# Patient Record
Sex: Male | Born: 1945 | Race: Black or African American | Hispanic: No | Marital: Married | Smoking: Never smoker
Health system: Southern US, Community
[De-identification: ages and names within clinical notes are randomized; demographics above are authoritative.]

## PROBLEM LIST (undated history)

## (undated) DIAGNOSIS — E119 Type 2 diabetes mellitus without complications: Secondary | ICD-10-CM

## (undated) DIAGNOSIS — K219 Gastro-esophageal reflux disease without esophagitis: Secondary | ICD-10-CM

## (undated) DIAGNOSIS — C9000 Multiple myeloma not having achieved remission: Secondary | ICD-10-CM

## (undated) DIAGNOSIS — E079 Disorder of thyroid, unspecified: Secondary | ICD-10-CM

## (undated) DIAGNOSIS — I10 Essential (primary) hypertension: Secondary | ICD-10-CM

## (undated) DIAGNOSIS — E785 Hyperlipidemia, unspecified: Secondary | ICD-10-CM

## (undated) DIAGNOSIS — C73 Malignant neoplasm of thyroid gland: Secondary | ICD-10-CM

## (undated) DIAGNOSIS — E669 Obesity, unspecified: Secondary | ICD-10-CM

## (undated) HISTORY — DX: Type 2 diabetes mellitus without complications: E11.9

## (undated) HISTORY — DX: Multiple myeloma not having achieved remission: C90.00

## (undated) HISTORY — DX: Hyperlipidemia, unspecified: E78.5

## (undated) HISTORY — DX: Obesity, unspecified: E66.9

## (undated) HISTORY — DX: Essential (primary) hypertension: I10

## (undated) HISTORY — PX: CATARACT EXTRACTION BILATERAL W/ ANTERIOR VITRECTOMY: SHX1304

## (undated) HISTORY — DX: Gastro-esophageal reflux disease without esophagitis: K21.9

## (undated) HISTORY — DX: Disorder of thyroid, unspecified: E07.9

## (undated) HISTORY — DX: Malignant neoplasm of thyroid gland: C73

---

## 2009-03-22 ENCOUNTER — Ambulatory Visit: Payer: Self-pay | Admitting: Gastroenterology

## 2011-04-10 ENCOUNTER — Ambulatory Visit: Payer: Self-pay | Admitting: Gastroenterology

## 2012-02-07 HISTORY — PX: BONE MARROW BIOPSY: SHX199

## 2012-05-08 ENCOUNTER — Ambulatory Visit: Payer: Self-pay | Admitting: Internal Medicine

## 2012-05-14 LAB — CALCIUM: Calcium, Total: 9.3 mg/dL (ref 8.5–10.1)

## 2012-05-14 LAB — CBC CANCER CENTER
HCT: 34.1 % — ABNORMAL LOW (ref 40.0–52.0)
HGB: 11.3 g/dL — ABNORMAL LOW (ref 13.0–18.0)
Lymphocytes: 20 %
MCH: 30 pg (ref 26.0–34.0)
MCHC: 33 g/dL (ref 32.0–36.0)
MCV: 91 fL (ref 80–100)
Platelet: 270 x10 3/mm (ref 150–440)
RBC: 3.75 10*6/uL — ABNORMAL LOW (ref 4.40–5.90)
RDW: 16.1 % — ABNORMAL HIGH (ref 11.5–14.5)
WBC: 4.7 x10 3/mm (ref 3.8–10.6)

## 2012-05-14 LAB — CREATININE, SERUM: EGFR (Non-African Amer.): 36 — ABNORMAL LOW

## 2012-05-15 LAB — PROT IMMUNOELECT,UR-24HR

## 2012-06-03 LAB — CBC CANCER CENTER
Basophil #: 0 x10 3/mm (ref 0.0–0.1)
Basophil %: 0.9 %
Eosinophil %: 2.5 %
HCT: 32.9 % — ABNORMAL LOW (ref 40.0–52.0)
HGB: 11 g/dL — ABNORMAL LOW (ref 13.0–18.0)
Lymphocyte #: 1.6 x10 3/mm (ref 1.0–3.6)
Lymphocyte %: 39.4 %
MCH: 30.7 pg (ref 26.0–34.0)
MCHC: 33.4 g/dL (ref 32.0–36.0)
Neutrophil #: 1.8 x10 3/mm (ref 1.4–6.5)
Platelet: 237 x10 3/mm (ref 150–440)
RDW: 16.5 % — ABNORMAL HIGH (ref 11.5–14.5)
WBC: 4.1 x10 3/mm (ref 3.8–10.6)

## 2012-06-03 LAB — CREATININE, SERUM
Creatinine: 1.85 mg/dL — ABNORMAL HIGH (ref 0.60–1.30)
EGFR (African American): 42 — ABNORMAL LOW
EGFR (Non-African Amer.): 37 — ABNORMAL LOW

## 2012-06-03 LAB — GLUCOSE, RANDOM: Glucose: 113 mg/dL — ABNORMAL HIGH (ref 65–99)

## 2012-06-06 ENCOUNTER — Ambulatory Visit: Payer: Self-pay | Admitting: Internal Medicine

## 2012-06-10 LAB — GLUCOSE, RANDOM: Glucose: 84 mg/dL (ref 65–99)

## 2012-06-10 LAB — CBC CANCER CENTER
Basophil #: 0 x10 3/mm (ref 0.0–0.1)
Basophil %: 0.3 %
HCT: 32.3 % — ABNORMAL LOW (ref 40.0–52.0)
HGB: 10.8 g/dL — ABNORMAL LOW (ref 13.0–18.0)
Lymphocyte %: 18.4 %
MCHC: 33.5 g/dL (ref 32.0–36.0)
MCV: 92 fL (ref 80–100)
Monocyte %: 8 %
Platelet: 209 x10 3/mm (ref 150–440)
RBC: 3.53 10*6/uL — ABNORMAL LOW (ref 4.40–5.90)
RDW: 16.6 % — ABNORMAL HIGH (ref 11.5–14.5)

## 2012-06-17 LAB — CBC CANCER CENTER
Basophil #: 0 x10 3/mm (ref 0.0–0.1)
Basophil %: 0.4 %
Eosinophil #: 0.3 x10 3/mm (ref 0.0–0.7)
HGB: 10 g/dL — ABNORMAL LOW (ref 13.0–18.0)
Lymphocyte %: 11 %
Monocyte #: 0.7 x10 3/mm (ref 0.2–1.0)
Monocyte %: 12.7 %
Neutrophil #: 3.7 x10 3/mm (ref 1.4–6.5)
Neutrophil %: 69.6 %
Platelet: 171 x10 3/mm (ref 150–440)
RDW: 16.7 % — ABNORMAL HIGH (ref 11.5–14.5)

## 2012-06-17 LAB — GLUCOSE, RANDOM: Glucose: 124 mg/dL — ABNORMAL HIGH (ref 65–99)

## 2012-07-02 LAB — CBC CANCER CENTER
Basophil #: 0 x10 3/mm (ref 0.0–0.1)
Basophil %: 0.7 %
HCT: 29.8 % — ABNORMAL LOW (ref 40.0–52.0)
HGB: 9.9 g/dL — ABNORMAL LOW (ref 13.0–18.0)
Lymphocyte #: 0.7 x10 3/mm — ABNORMAL LOW (ref 1.0–3.6)
MCV: 93 fL (ref 80–100)
Monocyte #: 0.3 x10 3/mm (ref 0.2–1.0)
Monocyte %: 8.9 %
Neutrophil #: 2.3 x10 3/mm (ref 1.4–6.5)
Neutrophil %: 61.7 %
Platelet: 206 x10 3/mm (ref 150–440)
RBC: 3.21 10*6/uL — ABNORMAL LOW (ref 4.40–5.90)
RDW: 17.1 % — ABNORMAL HIGH (ref 11.5–14.5)

## 2012-07-02 LAB — GLUCOSE, RANDOM: Glucose: 50 mg/dL — ABNORMAL LOW (ref 65–99)

## 2012-07-02 LAB — CREATININE, SERUM
Creatinine: 1.58 mg/dL — ABNORMAL HIGH (ref 0.60–1.30)
EGFR (African American): 51 — ABNORMAL LOW
EGFR (Non-African Amer.): 44 — ABNORMAL LOW

## 2012-07-02 LAB — HEPATIC FUNCTION PANEL A (ARMC)
Bilirubin, Direct: 0.1 mg/dL (ref 0.00–0.20)
Bilirubin,Total: 0.2 mg/dL (ref 0.2–1.0)
SGPT (ALT): 22 U/L (ref 12–78)
Total Protein: 7 g/dL (ref 6.4–8.2)

## 2012-07-07 ENCOUNTER — Ambulatory Visit: Payer: Self-pay | Admitting: Internal Medicine

## 2012-07-09 LAB — CBC CANCER CENTER
Basophil #: 0 x10 3/mm (ref 0.0–0.1)
Eosinophil %: 9.4 %
HCT: 29.3 % — ABNORMAL LOW (ref 40.0–52.0)
HGB: 9.5 g/dL — ABNORMAL LOW (ref 13.0–18.0)
Lymphocyte #: 1.1 x10 3/mm (ref 1.0–3.6)
Lymphocyte %: 29.2 %
MCH: 30.5 pg (ref 26.0–34.0)
MCV: 94 fL (ref 80–100)
Monocyte #: 0.8 x10 3/mm (ref 0.2–1.0)
Monocyte %: 20.7 %
Neutrophil #: 1.6 x10 3/mm (ref 1.4–6.5)
Neutrophil %: 40.1 %
RBC: 3.12 10*6/uL — ABNORMAL LOW (ref 4.40–5.90)
WBC: 3.9 x10 3/mm (ref 3.8–10.6)

## 2012-07-09 LAB — CREATININE, SERUM
Creatinine: 1.6 mg/dL — ABNORMAL HIGH (ref 0.60–1.30)
EGFR (African American): 51 — ABNORMAL LOW

## 2012-07-09 LAB — CALCIUM: Calcium, Total: 8.7 mg/dL (ref 8.5–10.1)

## 2012-07-16 LAB — CBC CANCER CENTER
Basophil #: 0 x10 3/mm (ref 0.0–0.1)
Eosinophil %: 7.1 %
HCT: 30.9 % — ABNORMAL LOW (ref 40.0–52.0)
HGB: 10.3 g/dL — ABNORMAL LOW (ref 13.0–18.0)
Lymphocyte #: 1.1 x10 3/mm (ref 1.0–3.6)
MCH: 31.6 pg (ref 26.0–34.0)
MCHC: 33.4 g/dL (ref 32.0–36.0)
MCV: 95 fL (ref 80–100)
Monocyte #: 0.7 x10 3/mm (ref 0.2–1.0)
Neutrophil %: 54.5 %
Platelet: 147 x10 3/mm — ABNORMAL LOW (ref 150–440)
RBC: 3.27 10*6/uL — ABNORMAL LOW (ref 4.40–5.90)
RDW: 18.2 % — ABNORMAL HIGH (ref 11.5–14.5)

## 2012-07-23 LAB — HEPATIC FUNCTION PANEL A (ARMC)
Albumin: 3 g/dL — ABNORMAL LOW (ref 3.4–5.0)
Alkaline Phosphatase: 71 U/L (ref 50–136)
Bilirubin, Direct: 0.1 mg/dL (ref 0.00–0.20)
Bilirubin,Total: 0.5 mg/dL (ref 0.2–1.0)
Total Protein: 6 g/dL — ABNORMAL LOW (ref 6.4–8.2)

## 2012-07-23 LAB — CBC CANCER CENTER
Basophil %: 0.5 %
HGB: 9.7 g/dL — ABNORMAL LOW (ref 13.0–18.0)
MCH: 32 pg (ref 26.0–34.0)
MCHC: 33.9 g/dL (ref 32.0–36.0)
MCV: 94 fL (ref 80–100)
Monocyte #: 0.3 x10 3/mm (ref 0.2–1.0)
Monocyte %: 8.9 %
Neutrophil #: 2.4 x10 3/mm (ref 1.4–6.5)
Neutrophil %: 65.5 %
Platelet: 136 x10 3/mm — ABNORMAL LOW (ref 150–440)
RBC: 3.02 10*6/uL — ABNORMAL LOW (ref 4.40–5.90)
WBC: 3.7 x10 3/mm — ABNORMAL LOW (ref 3.8–10.6)

## 2012-07-23 LAB — CREATININE, SERUM: Creatinine: 1.55 mg/dL — ABNORMAL HIGH (ref 0.60–1.30)

## 2012-07-29 LAB — CBC CANCER CENTER
Basophil #: 0 x10 3/mm (ref 0.0–0.1)
Eosinophil #: 0.4 x10 3/mm (ref 0.0–0.7)
Eosinophil %: 11.1 %
HCT: 30.7 % — ABNORMAL LOW (ref 40.0–52.0)
HGB: 10.2 g/dL — ABNORMAL LOW (ref 13.0–18.0)
Lymphocyte #: 0.8 x10 3/mm — ABNORMAL LOW (ref 1.0–3.6)
Lymphocyte %: 24.1 %
MCH: 31.7 pg (ref 26.0–34.0)
MCHC: 33.1 g/dL (ref 32.0–36.0)
MCV: 96 fL (ref 80–100)
Monocyte %: 16.9 %
Platelet: 125 x10 3/mm — ABNORMAL LOW (ref 150–440)
RBC: 3.21 10*6/uL — ABNORMAL LOW (ref 4.40–5.90)
RDW: 18.9 % — ABNORMAL HIGH (ref 11.5–14.5)

## 2012-08-06 ENCOUNTER — Ambulatory Visit: Payer: Self-pay | Admitting: Internal Medicine

## 2012-08-06 LAB — CBC CANCER CENTER
HCT: 31.8 % — ABNORMAL LOW (ref 40.0–52.0)
HGB: 10.9 g/dL — ABNORMAL LOW (ref 13.0–18.0)
MCV: 95 fL (ref 80–100)
Monocyte #: 0.7 x10 3/mm (ref 0.2–1.0)
Monocyte %: 17.3 %
Neutrophil #: 2.1 x10 3/mm (ref 1.4–6.5)
Neutrophil %: 54.8 %
RBC: 3.35 10*6/uL — ABNORMAL LOW (ref 4.40–5.90)
WBC: 3.9 x10 3/mm (ref 3.8–10.6)

## 2012-08-13 LAB — BASIC METABOLIC PANEL
Calcium, Total: 8.2 mg/dL — ABNORMAL LOW (ref 8.5–10.1)
Chloride: 106 mmol/L (ref 98–107)
Co2: 30 mmol/L (ref 21–32)
Creatinine: 1.47 mg/dL — ABNORMAL HIGH (ref 0.60–1.30)
EGFR (African American): 56 — ABNORMAL LOW
Glucose: 148 mg/dL — ABNORMAL HIGH (ref 65–99)
Osmolality: 285 (ref 275–301)
Potassium: 3.6 mmol/L (ref 3.5–5.1)
Sodium: 141 mmol/L (ref 136–145)

## 2012-08-13 LAB — CBC CANCER CENTER
Basophil #: 0 x10 3/mm (ref 0.0–0.1)
Basophil %: 0.9 %
Eosinophil %: 8.4 %
HCT: 29.8 % — ABNORMAL LOW (ref 40.0–52.0)
HGB: 10.1 g/dL — ABNORMAL LOW (ref 13.0–18.0)
Lymphocyte %: 18.5 %
MCH: 32.4 pg (ref 26.0–34.0)
MCHC: 33.8 g/dL (ref 32.0–36.0)
Monocyte #: 0.2 x10 3/mm (ref 0.2–1.0)
Monocyte %: 7.8 %
Neutrophil #: 1.9 x10 3/mm (ref 1.4–6.5)

## 2012-08-20 LAB — CBC CANCER CENTER
Eosinophil #: 0.3 x10 3/mm (ref 0.0–0.7)
HCT: 29.5 % — ABNORMAL LOW (ref 40.0–52.0)
HGB: 10 g/dL — ABNORMAL LOW (ref 13.0–18.0)
Lymphocyte #: 0.6 x10 3/mm — ABNORMAL LOW (ref 1.0–3.6)
MCH: 32.7 pg (ref 26.0–34.0)
MCV: 96 fL (ref 80–100)
Monocyte #: 0.7 x10 3/mm (ref 0.2–1.0)
Neutrophil #: 2.1 x10 3/mm (ref 1.4–6.5)
Neutrophil %: 56 %
Platelet: 142 x10 3/mm — ABNORMAL LOW (ref 150–440)
RBC: 3.07 10*6/uL — ABNORMAL LOW (ref 4.40–5.90)

## 2012-08-26 LAB — CBC CANCER CENTER
Basophil #: 0 "x10 3/mm "
Basophil %: 0.9 %
Eosinophil #: 0.1 "x10 3/mm "
Eosinophil %: 2.8 %
HCT: 30 % — ABNORMAL LOW
HGB: 10.1 g/dL — ABNORMAL LOW
Lymphocyte %: 15.9 %
Lymphs Abs: 0.6 "x10 3/mm " — ABNORMAL LOW
MCH: 32 pg
MCHC: 33.6 g/dL
MCV: 95 fL
Monocyte #: 1 "x10 3/mm "
Monocyte %: 26.1 %
Neutrophil #: 2 "x10 3/mm "
Neutrophil %: 54.3 %
Platelet: 115 "x10 3/mm " — ABNORMAL LOW
RBC: 3.16 "x10 6/mm " — ABNORMAL LOW
RDW: 18.4 % — ABNORMAL HIGH
WBC: 3.7 "x10 3/mm " — ABNORMAL LOW

## 2012-09-03 LAB — CREATININE, SERUM
EGFR (African American): >60
EGFR (Non-African Amer.): >60

## 2012-09-03 LAB — CBC CANCER CENTER
Basophil #: 0 x10 3/mm (ref 0.0–0.1)
Eosinophil #: 0.3 x10 3/mm (ref 0.0–0.7)
HCT: 31.4 % — ABNORMAL LOW (ref 40.0–52.0)
HGB: 10.7 g/dL — ABNORMAL LOW (ref 13.0–18.0)
Lymphocyte #: 0.9 x10 3/mm — ABNORMAL LOW (ref 1.0–3.6)
MCHC: 34.1 g/dL (ref 32.0–36.0)
MCV: 95 fL (ref 80–100)
Neutrophil #: 2.6 x10 3/mm (ref 1.4–6.5)
Neutrophil %: 62.7 %
RBC: 3.32 10*6/uL — ABNORMAL LOW (ref 4.40–5.90)
WBC: 4.2 x10 3/mm (ref 3.8–10.6)

## 2012-09-03 LAB — HEPATIC FUNCTION PANEL A (ARMC)
Albumin: 3.1 g/dL — ABNORMAL LOW (ref 3.4–5.0)
Alkaline Phosphatase: 66 U/L (ref 50–136)
Bilirubin,Total: 0.4 mg/dL (ref 0.2–1.0)
SGPT (ALT): 22 U/L (ref 12–78)
Total Protein: 5.9 g/dL — ABNORMAL LOW (ref 6.4–8.2)

## 2012-09-06 ENCOUNTER — Ambulatory Visit: Payer: Self-pay | Admitting: Internal Medicine

## 2012-09-10 LAB — CBC CANCER CENTER
Basophil %: 0.6 %
Eosinophil %: 11.5 %
HGB: 10.7 g/dL — ABNORMAL LOW (ref 13.0–18.0)
Lymphocyte #: 0.9 x10 3/mm — ABNORMAL LOW (ref 1.0–3.6)
Lymphocyte %: 17 %
MCH: 32.2 pg (ref 26.0–34.0)
Monocyte #: 0.9 x10 3/mm (ref 0.2–1.0)
Monocyte %: 16.9 %
Platelet: 192 x10 3/mm (ref 150–440)
WBC: 5.2 x10 3/mm (ref 3.8–10.6)

## 2012-09-17 LAB — BASIC METABOLIC PANEL
Anion Gap: 9 (ref 7–16)
BUN: 9 mg/dL (ref 7–18)
Chloride: 102 mmol/L (ref 98–107)
Co2: 30 mmol/L (ref 21–32)
Creatinine: 1.1 mg/dL (ref 0.60–1.30)
EGFR (African American): >60
Glucose: 158 mg/dL — ABNORMAL HIGH (ref 65–99)

## 2012-09-17 LAB — CBC CANCER CENTER
Basophil %: 0.9 %
Eosinophil %: 9.2 %
HGB: 11.2 g/dL — ABNORMAL LOW (ref 13.0–18.0)
Lymphocyte #: 1 x10 3/mm (ref 1.0–3.6)
Lymphocyte %: 21.1 %
MCHC: 33.4 g/dL (ref 32.0–36.0)
Monocyte #: 0.9 x10 3/mm (ref 0.2–1.0)
Monocyte %: 19.1 %
Neutrophil #: 2.3 x10 3/mm (ref 1.4–6.5)
Neutrophil %: 49.7 %
Platelet: 153 x10 3/mm (ref 150–440)
RBC: 3.52 10*6/uL — ABNORMAL LOW (ref 4.40–5.90)

## 2012-09-24 LAB — MAGNESIUM: Magnesium: 1.9 mg/dL

## 2012-09-24 LAB — CBC CANCER CENTER
Eosinophil #: 0.5 x10 3/mm (ref 0.0–0.7)
Eosinophil %: 10.8 %
HCT: 33.1 % — ABNORMAL LOW (ref 40.0–52.0)
HGB: 11 g/dL — ABNORMAL LOW (ref 13.0–18.0)
Lymphocyte #: 0.7 x10 3/mm — ABNORMAL LOW (ref 1.0–3.6)
Lymphocyte %: 16.1 %
MCH: 31.9 pg (ref 26.0–34.0)
MCHC: 33.3 g/dL (ref 32.0–36.0)
MCV: 96 fL (ref 80–100)
Monocyte #: 0.4 x10 3/mm (ref 0.2–1.0)
Monocyte %: 8.6 %
Neutrophil %: 63.8 %
RBC: 3.46 10*6/uL — ABNORMAL LOW (ref 4.40–5.90)
WBC: 4.4 x10 3/mm (ref 3.8–10.6)

## 2012-10-01 LAB — CBC CANCER CENTER
Basophil #: 0 x10 3/mm (ref 0.0–0.1)
Eosinophil #: 0.3 x10 3/mm (ref 0.0–0.7)
Eosinophil %: 8.4 %
HCT: 32.1 % — ABNORMAL LOW (ref 40.0–52.0)
HGB: 11.1 g/dL — ABNORMAL LOW (ref 13.0–18.0)
Lymphocyte #: 0.7 x10 3/mm — ABNORMAL LOW (ref 1.0–3.6)
Lymphocyte %: 15.8 %
MCH: 33 pg (ref 26.0–34.0)
MCHC: 34.5 g/dL (ref 32.0–36.0)
Neutrophil #: 2.4 x10 3/mm (ref 1.4–6.5)
Neutrophil %: 57.2 %
RBC: 3.35 10*6/uL — ABNORMAL LOW (ref 4.40–5.90)
RDW: 17.7 % — ABNORMAL HIGH (ref 11.5–14.5)
WBC: 4.1 x10 3/mm (ref 3.8–10.6)

## 2012-10-07 ENCOUNTER — Ambulatory Visit: Payer: Self-pay | Admitting: Internal Medicine

## 2012-10-08 LAB — CBC CANCER CENTER
Basophil %: 0.8 %
Eosinophil %: 5.6 %
HCT: 34.2 % — ABNORMAL LOW (ref 40.0–52.0)
HGB: 11.2 g/dL — ABNORMAL LOW (ref 13.0–18.0)
MCHC: 32.8 g/dL (ref 32.0–36.0)
Monocyte #: 0.8 x10 3/mm (ref 0.2–1.0)
Monocyte %: 21.7 %
Neutrophil #: 1.7 x10 3/mm (ref 1.4–6.5)
Neutrophil %: 48.7 %
Platelet: 130 x10 3/mm — ABNORMAL LOW (ref 150–440)
RBC: 3.53 10*6/uL — ABNORMAL LOW (ref 4.40–5.90)
RDW: 17.6 % — ABNORMAL HIGH (ref 11.5–14.5)

## 2012-10-15 LAB — HEPATIC FUNCTION PANEL A (ARMC)
Albumin: 3.3 g/dL — ABNORMAL LOW (ref 3.4–5.0)
Alkaline Phosphatase: 62 U/L (ref 50–136)
Bilirubin,Total: 0.5 mg/dL (ref 0.2–1.0)
Total Protein: 6.3 g/dL — ABNORMAL LOW (ref 6.4–8.2)

## 2012-10-15 LAB — CBC CANCER CENTER
Basophil #: 0 x10 3/mm (ref 0.0–0.1)
Basophil %: 0.6 %
Eosinophil #: 0.2 x10 3/mm (ref 0.0–0.7)
Eosinophil %: 5.5 %
HGB: 11.7 g/dL — ABNORMAL LOW (ref 13.0–18.0)
Lymphocyte #: 0.7 x10 3/mm — ABNORMAL LOW (ref 1.0–3.6)
MCH: 32.3 pg (ref 26.0–34.0)
MCHC: 33.4 g/dL (ref 32.0–36.0)
Monocyte #: 0.3 x10 3/mm (ref 0.2–1.0)
Monocyte %: 9.7 %
Neutrophil #: 2.2 x10 3/mm (ref 1.4–6.5)
Platelet: 182 x10 3/mm (ref 150–440)
RBC: 3.63 10*6/uL — ABNORMAL LOW (ref 4.40–5.90)
RDW: 17.8 % — ABNORMAL HIGH (ref 11.5–14.5)
WBC: 3.5 x10 3/mm — ABNORMAL LOW (ref 3.8–10.6)

## 2012-10-15 LAB — CREATININE, SERUM
Creatinine: 1.2 mg/dL (ref 0.60–1.30)
EGFR (Non-African Amer.): >60

## 2012-10-22 LAB — CBC CANCER CENTER
Basophil #: 0 x10 3/mm (ref 0.0–0.1)
Eosinophil #: 0.2 x10 3/mm (ref 0.0–0.7)
Eosinophil %: 4.7 %
HGB: 11.8 g/dL — ABNORMAL LOW (ref 13.0–18.0)
Lymphocyte #: 0.7 x10 3/mm — ABNORMAL LOW (ref 1.0–3.6)
Lymphocyte %: 18.4 %
Monocyte %: 19.1 %
Neutrophil #: 2.1 x10 3/mm (ref 1.4–6.5)
Platelet: 149 x10 3/mm — ABNORMAL LOW (ref 150–440)
RBC: 3.67 10*6/uL — ABNORMAL LOW (ref 4.40–5.90)
RDW: 17 % — ABNORMAL HIGH (ref 11.5–14.5)

## 2012-10-29 LAB — BASIC METABOLIC PANEL
Anion Gap: 3 — ABNORMAL LOW (ref 7–16)
Calcium, Total: 9.3 mg/dL (ref 8.5–10.1)
Co2: 33 mmol/L — ABNORMAL HIGH (ref 21–32)
EGFR (African American): >60
EGFR (Non-African Amer.): >60
Glucose: 142 mg/dL — ABNORMAL HIGH (ref 65–99)
Osmolality: 274 (ref 275–301)
Sodium: 136 mmol/L (ref 136–145)

## 2012-10-29 LAB — CBC CANCER CENTER
Bands: 1 %
HCT: 41.1 % (ref 40.0–52.0)
HGB: 13.5 g/dL (ref 13.0–18.0)
MCH: 31.5 pg (ref 26.0–34.0)
MCHC: 32.7 g/dL (ref 32.0–36.0)
MCV: 96 fL (ref 80–100)
Monocytes: 19 %
Platelet: 171 x10 3/mm (ref 150–440)
WBC: 4.6 x10 3/mm (ref 3.8–10.6)

## 2012-11-05 LAB — CBC CANCER CENTER
Basophil #: 0 x10 3/mm (ref 0.0–0.1)
Basophil %: 0.9 %
Eosinophil #: 0.1 x10 3/mm (ref 0.0–0.7)
Lymphocyte #: 0.7 x10 3/mm — ABNORMAL LOW (ref 1.0–3.6)
MCH: 31.9 pg (ref 26.0–34.0)
MCV: 97 fL (ref 80–100)
Neutrophil #: 3.3 x10 3/mm (ref 1.4–6.5)
RBC: 3.89 10*6/uL — ABNORMAL LOW (ref 4.40–5.90)

## 2012-11-05 LAB — HEPATIC FUNCTION PANEL A (ARMC)
Albumin: 3.4 g/dL (ref 3.4–5.0)
Alkaline Phosphatase: 56 U/L (ref 50–136)
SGOT(AST): 10 U/L — ABNORMAL LOW (ref 15–37)
SGPT (ALT): 18 U/L (ref 12–78)
Total Protein: 6.3 g/dL — ABNORMAL LOW (ref 6.4–8.2)

## 2012-11-05 LAB — CREATININE, SERUM
Creatinine: 1.18 mg/dL (ref 0.60–1.30)
EGFR (African American): >60
EGFR (Non-African Amer.): >60

## 2012-11-06 ENCOUNTER — Ambulatory Visit: Payer: Self-pay | Admitting: Internal Medicine

## 2012-11-19 LAB — CBC CANCER CENTER
Basophil %: 1.1 %
Eosinophil %: 5.4 %
HCT: 36.4 % — ABNORMAL LOW (ref 40.0–52.0)
HGB: 12.1 g/dL — ABNORMAL LOW (ref 13.0–18.0)
Lymphocyte #: 1 x10 3/mm (ref 1.0–3.6)
Lymphocyte %: 24.6 %
MCH: 32.5 pg (ref 26.0–34.0)
MCV: 98 fL (ref 80–100)
Monocyte #: 0.9 x10 3/mm (ref 0.2–1.0)
Monocyte %: 22.6 %
Neutrophil %: 46.3 %
RDW: 17.4 % — ABNORMAL HIGH (ref 11.5–14.5)
WBC: 4 x10 3/mm (ref 3.8–10.6)

## 2012-11-21 ENCOUNTER — Encounter: Payer: Self-pay | Admitting: Internal Medicine

## 2012-12-03 LAB — CBC CANCER CENTER
Basophil #: 0.1 x10 3/mm (ref 0.0–0.1)
Basophil %: 1.3 %
Eosinophil #: 0.1 x10 3/mm (ref 0.0–0.7)
HCT: 36.2 % — ABNORMAL LOW (ref 40.0–52.0)
HGB: 12 g/dL — ABNORMAL LOW (ref 13.0–18.0)
Lymphocyte %: 20.1 %
MCH: 32 pg (ref 26.0–34.0)
Monocyte %: 17.8 %
Neutrophil #: 2.9 x10 3/mm (ref 1.4–6.5)
Platelet: 213 x10 3/mm (ref 150–440)
RBC: 3.75 10*6/uL — ABNORMAL LOW (ref 4.40–5.90)
RDW: 17.8 % — ABNORMAL HIGH (ref 11.5–14.5)

## 2012-12-07 ENCOUNTER — Ambulatory Visit: Payer: Self-pay | Admitting: Internal Medicine

## 2012-12-07 ENCOUNTER — Encounter: Payer: Self-pay | Admitting: Internal Medicine

## 2012-12-17 LAB — CBC CANCER CENTER
Basophil #: 0 x10 3/mm (ref 0.0–0.1)
Eosinophil #: 0.2 x10 3/mm (ref 0.0–0.7)
Eosinophil %: 4.4 %
HCT: 35 % — ABNORMAL LOW (ref 40.0–52.0)
HGB: 11.4 g/dL — ABNORMAL LOW (ref 13.0–18.0)
Lymphocyte %: 15.8 %
MCV: 97 fL (ref 80–100)
Neutrophil #: 3 x10 3/mm (ref 1.4–6.5)
Neutrophil %: 67.9 %
Platelet: 209 x10 3/mm (ref 150–440)
RBC: 3.61 10*6/uL — ABNORMAL LOW (ref 4.40–5.90)

## 2012-12-17 LAB — BASIC METABOLIC PANEL
Anion Gap: 7 (ref 7–16)
BUN: 15 mg/dL (ref 7–18)
Calcium, Total: 9 mg/dL (ref 8.5–10.1)
Creatinine: 1.12 mg/dL (ref 0.60–1.30)
EGFR (African American): >60
Glucose: 138 mg/dL — ABNORMAL HIGH (ref 65–99)
Osmolality: 290 (ref 275–301)
Potassium: 3.3 mmol/L — ABNORMAL LOW (ref 3.5–5.1)
Sodium: 144 mmol/L (ref 136–145)

## 2012-12-17 LAB — HEPATIC FUNCTION PANEL A (ARMC)
Alkaline Phosphatase: 62 U/L (ref 50–136)
Total Protein: 6.3 g/dL — ABNORMAL LOW (ref 6.4–8.2)

## 2012-12-17 LAB — MAGNESIUM: Magnesium: 1.8 mg/dL

## 2012-12-31 LAB — CBC CANCER CENTER
Basophil #: 0 x10 3/mm (ref 0.0–0.1)
Basophil %: 1.2 %
HCT: 37.3 % — ABNORMAL LOW (ref 40.0–52.0)
Lymphocyte %: 26 %
MCH: 32.2 pg (ref 26.0–34.0)
MCHC: 33.1 g/dL (ref 32.0–36.0)
MCV: 97 fL (ref 80–100)
Neutrophil #: 1.7 x10 3/mm (ref 1.4–6.5)
Neutrophil %: 44.9 %
Platelet: 181 x10 3/mm (ref 150–440)
RBC: 3.84 10*6/uL — ABNORMAL LOW (ref 4.40–5.90)

## 2013-01-06 ENCOUNTER — Ambulatory Visit: Payer: Self-pay | Admitting: Internal Medicine

## 2013-01-14 LAB — CBC CANCER CENTER
Basophil #: 0 x10 3/mm (ref 0.0–0.1)
Basophil %: 0.3 %
Eosinophil #: 0.3 x10 3/mm (ref 0.0–0.7)
Eosinophil %: 6.8 %
HCT: 36.4 % — ABNORMAL LOW (ref 40.0–52.0)
HGB: 11.9 g/dL — ABNORMAL LOW (ref 13.0–18.0)
Lymphocyte %: 25.6 %
MCHC: 32.8 g/dL (ref 32.0–36.0)
MCV: 98 fL (ref 80–100)
Monocyte %: 16.3 %
Platelet: 171 x10 3/mm (ref 150–440)
RDW: 17.1 % — ABNORMAL HIGH (ref 11.5–14.5)
WBC: 4.3 x10 3/mm (ref 3.8–10.6)

## 2013-01-28 LAB — CBC CANCER CENTER
Basophil #: 0 x10 3/mm (ref 0.0–0.1)
Eosinophil #: 0.1 x10 3/mm (ref 0.0–0.7)
HGB: 12.6 g/dL — ABNORMAL LOW (ref 13.0–18.0)
Lymphocyte #: 0.6 x10 3/mm — ABNORMAL LOW (ref 1.0–3.6)
Lymphocyte %: 9.3 %
MCH: 32.1 pg (ref 26.0–34.0)
Monocyte #: 0.5 x10 3/mm (ref 0.2–1.0)
Monocyte %: 7.2 %
Neutrophil #: 5.3 x10 3/mm (ref 1.4–6.5)
Neutrophil %: 80.8 %
RBC: 3.91 10*6/uL — ABNORMAL LOW (ref 4.40–5.90)
RDW: 16.8 % — ABNORMAL HIGH (ref 11.5–14.5)
WBC: 6.5 x10 3/mm (ref 3.8–10.6)

## 2013-02-06 ENCOUNTER — Ambulatory Visit: Payer: Self-pay | Admitting: Internal Medicine

## 2013-02-11 LAB — MAGNESIUM: MAGNESIUM: 1.8 mg/dL

## 2013-02-11 LAB — CBC CANCER CENTER
BASOS ABS: 0.1 x10 3/mm (ref 0.0–0.1)
Basophil %: 1.2 %
EOS ABS: 0.4 x10 3/mm (ref 0.0–0.7)
Eosinophil %: 9.5 %
HCT: 37.2 % — ABNORMAL LOW (ref 40.0–52.0)
HGB: 12.2 g/dL — AB (ref 13.0–18.0)
LYMPHS PCT: 18.5 %
Lymphocyte #: 0.9 x10 3/mm — ABNORMAL LOW (ref 1.0–3.6)
MCH: 32.5 pg (ref 26.0–34.0)
MCHC: 32.7 g/dL (ref 32.0–36.0)
MCV: 99 fL (ref 80–100)
Monocyte #: 0.7 x10 3/mm (ref 0.2–1.0)
Monocyte %: 14.1 %
NEUTROS PCT: 56.7 %
Neutrophil #: 2.7 x10 3/mm (ref 1.4–6.5)
Platelet: 216 x10 3/mm (ref 150–440)
RBC: 3.74 10*6/uL — ABNORMAL LOW (ref 4.40–5.90)
RDW: 16.4 % — ABNORMAL HIGH (ref 11.5–14.5)
WBC: 4.7 x10 3/mm (ref 3.8–10.6)

## 2013-02-11 LAB — BASIC METABOLIC PANEL
Anion Gap: 7 (ref 7–16)
BUN: 13 mg/dL (ref 7–18)
CHLORIDE: 102 mmol/L (ref 98–107)
CO2: 31 mmol/L (ref 21–32)
Calcium, Total: 9.2 mg/dL (ref 8.5–10.1)
Creatinine: 1.12 mg/dL (ref 0.60–1.30)
EGFR (African American): >60
EGFR (Non-African Amer.): >60
GLUCOSE: 100 mg/dL — AB (ref 65–99)
OSMOLALITY: 280 (ref 275–301)
Potassium: 3.6 mmol/L (ref 3.5–5.1)
SODIUM: 140 mmol/L (ref 136–145)

## 2013-02-11 LAB — HEPATIC FUNCTION PANEL A (ARMC)
ALK PHOS: 57 U/L
Albumin: 3.1 g/dL — ABNORMAL LOW (ref 3.4–5.0)
Bilirubin, Direct: 0.1 mg/dL (ref 0.00–0.20)
Bilirubin,Total: 0.3 mg/dL (ref 0.2–1.0)
SGOT(AST): 11 U/L — ABNORMAL LOW (ref 15–37)
SGPT (ALT): 17 U/L (ref 12–78)
Total Protein: 6.6 g/dL (ref 6.4–8.2)

## 2013-02-11 LAB — PHOSPHORUS: PHOSPHORUS: 2.9 mg/dL (ref 2.5–4.9)

## 2013-02-25 LAB — CBC CANCER CENTER
BASOS ABS: 0 x10 3/mm (ref 0.0–0.1)
Basophil %: 1 %
EOS PCT: 6.5 %
Eosinophil #: 0.2 x10 3/mm (ref 0.0–0.7)
HCT: 36.2 % — ABNORMAL LOW (ref 40.0–52.0)
HGB: 11.8 g/dL — AB (ref 13.0–18.0)
Lymphocyte #: 1 x10 3/mm (ref 1.0–3.6)
Lymphocyte %: 26.7 %
MCH: 32.5 pg (ref 26.0–34.0)
MCHC: 32.7 g/dL (ref 32.0–36.0)
MCV: 99 fL (ref 80–100)
MONOS PCT: 16.4 %
Monocyte #: 0.6 x10 3/mm (ref 0.2–1.0)
NEUTROS PCT: 49.4 %
Neutrophil #: 1.9 x10 3/mm (ref 1.4–6.5)
Platelet: 156 x10 3/mm (ref 150–440)
RBC: 3.65 10*6/uL — AB (ref 4.40–5.90)
RDW: 16.2 % — ABNORMAL HIGH (ref 11.5–14.5)
WBC: 3.8 x10 3/mm (ref 3.8–10.6)

## 2013-02-27 LAB — KAPPA/LAMBDA FREE LIGHT CHAINS (ARMC)

## 2013-02-27 LAB — PROT IMMUNOELECTROPHORES(ARMC)

## 2013-03-09 ENCOUNTER — Ambulatory Visit: Payer: Self-pay | Admitting: Internal Medicine

## 2013-03-11 LAB — CBC CANCER CENTER
BASOS ABS: 0 x10 3/mm (ref 0.0–0.1)
BASOS PCT: 0.7 %
Eosinophil #: 0.2 x10 3/mm (ref 0.0–0.7)
Eosinophil %: 4.6 %
HCT: 36.3 % — AB (ref 40.0–52.0)
HGB: 11.8 g/dL — ABNORMAL LOW (ref 13.0–18.0)
LYMPHS ABS: 0.8 x10 3/mm — AB (ref 1.0–3.6)
Lymphocyte %: 19.3 %
MCH: 32.4 pg (ref 26.0–34.0)
MCHC: 32.6 g/dL (ref 32.0–36.0)
MCV: 100 fL (ref 80–100)
MONOS PCT: 13.3 %
Monocyte #: 0.5 x10 3/mm (ref 0.2–1.0)
NEUTROS ABS: 2.5 x10 3/mm (ref 1.4–6.5)
Neutrophil %: 62.1 %
Platelet: 177 x10 3/mm (ref 150–440)
RBC: 3.65 10*6/uL — AB (ref 4.40–5.90)
RDW: 16.6 % — ABNORMAL HIGH (ref 11.5–14.5)
WBC: 3.9 x10 3/mm (ref 3.8–10.6)

## 2013-03-27 LAB — CBC CANCER CENTER
BASOS ABS: 0 x10 3/mm (ref 0.0–0.1)
BASOS PCT: 0.1 %
EOS PCT: 0.1 %
Eosinophil #: 0 x10 3/mm (ref 0.0–0.7)
HCT: 37.6 % — ABNORMAL LOW (ref 40.0–52.0)
HGB: 12.1 g/dL — ABNORMAL LOW (ref 13.0–18.0)
LYMPHS ABS: 0.5 x10 3/mm — AB (ref 1.0–3.6)
LYMPHS PCT: 9.1 %
MCH: 32.3 pg (ref 26.0–34.0)
MCHC: 32.1 g/dL (ref 32.0–36.0)
MCV: 101 fL — ABNORMAL HIGH (ref 80–100)
MONO ABS: 0.4 x10 3/mm (ref 0.2–1.0)
Monocyte %: 8 %
NEUTROS ABS: 4.5 x10 3/mm (ref 1.4–6.5)
Neutrophil %: 82.7 %
PLATELETS: 182 x10 3/mm (ref 150–440)
RBC: 3.73 10*6/uL — AB (ref 4.40–5.90)
RDW: 16.5 % — ABNORMAL HIGH (ref 11.5–14.5)
WBC: 5.4 x10 3/mm (ref 3.8–10.6)

## 2013-04-06 ENCOUNTER — Ambulatory Visit: Payer: Self-pay | Admitting: Internal Medicine

## 2013-04-08 LAB — HEPATIC FUNCTION PANEL A (ARMC)
ALK PHOS: 55 U/L
Albumin: 3.1 g/dL — ABNORMAL LOW (ref 3.4–5.0)
BILIRUBIN DIRECT: 0.1 mg/dL (ref 0.00–0.20)
Bilirubin,Total: 0.4 mg/dL (ref 0.2–1.0)
SGOT(AST): 9 U/L — ABNORMAL LOW (ref 15–37)
SGPT (ALT): 19 U/L (ref 12–78)
TOTAL PROTEIN: 6.5 g/dL (ref 6.4–8.2)

## 2013-04-08 LAB — BASIC METABOLIC PANEL
ANION GAP: 6 — AB (ref 7–16)
BUN: 18 mg/dL (ref 7–18)
CALCIUM: 8.6 mg/dL (ref 8.5–10.1)
CO2: 30 mmol/L (ref 21–32)
CREATININE: 1.28 mg/dL (ref 0.60–1.30)
Chloride: 107 mmol/L (ref 98–107)
EGFR (African American): >60
GFR CALC NON AF AMER: 57 — AB
GLUCOSE: 147 mg/dL — AB (ref 65–99)
Osmolality: 290 (ref 275–301)
POTASSIUM: 3.6 mmol/L (ref 3.5–5.1)
SODIUM: 143 mmol/L (ref 136–145)

## 2013-04-08 LAB — CBC CANCER CENTER
BASOS PCT: 0.9 %
Basophil #: 0 x10 3/mm (ref 0.0–0.1)
Eosinophil #: 0.2 x10 3/mm (ref 0.0–0.7)
Eosinophil %: 5.2 %
HCT: 37 % — AB (ref 40.0–52.0)
HGB: 12 g/dL — AB (ref 13.0–18.0)
Lymphocyte #: 0.9 x10 3/mm — ABNORMAL LOW (ref 1.0–3.6)
Lymphocyte %: 27.3 %
MCH: 32.4 pg (ref 26.0–34.0)
MCHC: 32.4 g/dL (ref 32.0–36.0)
MCV: 100 fL (ref 80–100)
MONOS PCT: 17.4 %
Monocyte #: 0.6 x10 3/mm (ref 0.2–1.0)
NEUTROS PCT: 49.2 %
Neutrophil #: 1.7 x10 3/mm (ref 1.4–6.5)
Platelet: 151 x10 3/mm (ref 150–440)
RBC: 3.71 10*6/uL — AB (ref 4.40–5.90)
RDW: 16.5 % — AB (ref 11.5–14.5)
WBC: 3.4 x10 3/mm — ABNORMAL LOW (ref 3.8–10.6)

## 2013-04-08 LAB — PHOSPHORUS: PHOSPHORUS: 3.1 mg/dL (ref 2.5–4.9)

## 2013-04-08 LAB — MAGNESIUM: MAGNESIUM: 1.5 mg/dL — AB

## 2013-04-22 LAB — CBC CANCER CENTER
Basophil #: 0 x10 3/mm (ref 0.0–0.1)
Basophil %: 1.1 %
EOS ABS: 0.2 x10 3/mm (ref 0.0–0.7)
Eosinophil %: 8.8 %
HCT: 35.5 % — ABNORMAL LOW (ref 40.0–52.0)
HGB: 11.5 g/dL — ABNORMAL LOW (ref 13.0–18.0)
Lymphocyte #: 0.5 x10 3/mm — ABNORMAL LOW (ref 1.0–3.6)
Lymphocyte %: 24.3 %
MCH: 32 pg (ref 26.0–34.0)
MCHC: 32.3 g/dL (ref 32.0–36.0)
MCV: 99 fL (ref 80–100)
MONOS PCT: 9 %
Monocyte #: 0.2 x10 3/mm (ref 0.2–1.0)
NEUTROS PCT: 56.8 %
Neutrophil #: 1.3 x10 3/mm — ABNORMAL LOW (ref 1.4–6.5)
PLATELETS: 200 x10 3/mm (ref 150–440)
RBC: 3.58 10*6/uL — AB (ref 4.40–5.90)
RDW: 15.7 % — ABNORMAL HIGH (ref 11.5–14.5)
WBC: 2.3 x10 3/mm — AB (ref 3.8–10.6)

## 2013-05-06 LAB — CBC CANCER CENTER
Basophil #: 0.1 x10 3/mm (ref 0.0–0.1)
Basophil %: 1.7 %
EOS ABS: 0.3 x10 3/mm (ref 0.0–0.7)
Eosinophil %: 11 %
HCT: 34.9 % — ABNORMAL LOW (ref 40.0–52.0)
HGB: 11.4 g/dL — ABNORMAL LOW (ref 13.0–18.0)
LYMPHS ABS: 0.8 x10 3/mm — AB (ref 1.0–3.6)
Lymphocyte %: 27.5 %
MCH: 32.3 pg (ref 26.0–34.0)
MCHC: 32.6 g/dL (ref 32.0–36.0)
MCV: 99 fL (ref 80–100)
MONO ABS: 0.7 x10 3/mm (ref 0.2–1.0)
MONOS PCT: 23.8 %
Neutrophil #: 1.1 x10 3/mm — ABNORMAL LOW (ref 1.4–6.5)
Neutrophil %: 36 %
Platelet: 136 x10 3/mm — ABNORMAL LOW (ref 150–440)
RBC: 3.52 10*6/uL — AB (ref 4.40–5.90)
RDW: 15.6 % — AB (ref 11.5–14.5)
WBC: 2.9 x10 3/mm — ABNORMAL LOW (ref 3.8–10.6)

## 2013-05-06 LAB — PHOSPHORUS: Phosphorus: 2.2 mg/dL — ABNORMAL LOW (ref 2.5–4.9)

## 2013-05-06 LAB — HEPATIC FUNCTION PANEL A (ARMC)
ALBUMIN: 3.2 g/dL — AB (ref 3.4–5.0)
ALK PHOS: 45 U/L
ALT: 13 U/L (ref 12–78)
AST: 14 U/L — AB (ref 15–37)
Bilirubin, Direct: 0.1 mg/dL (ref 0.00–0.20)
Bilirubin,Total: 0.5 mg/dL (ref 0.2–1.0)
Total Protein: 6.8 g/dL (ref 6.4–8.2)

## 2013-05-06 LAB — CREATININE, SERUM
CREATININE: 1.12 mg/dL (ref 0.60–1.30)
EGFR (African American): >60
EGFR (Non-African Amer.): >60

## 2013-05-06 LAB — MAGNESIUM: Magnesium: 1.7 mg/dL — ABNORMAL LOW

## 2013-05-07 ENCOUNTER — Ambulatory Visit: Payer: Self-pay | Admitting: Internal Medicine

## 2013-05-20 LAB — CBC CANCER CENTER
BASOS ABS: 0 x10 3/mm (ref 0.0–0.1)
Basophil %: 1.3 %
Eosinophil #: 0.2 x10 3/mm (ref 0.0–0.7)
Eosinophil %: 6.4 %
HCT: 35.4 % — AB (ref 40.0–52.0)
HGB: 11.5 g/dL — ABNORMAL LOW (ref 13.0–18.0)
LYMPHS PCT: 33.9 %
Lymphocyte #: 0.9 x10 3/mm — ABNORMAL LOW (ref 1.0–3.6)
MCH: 31.7 pg (ref 26.0–34.0)
MCHC: 32.4 g/dL (ref 32.0–36.0)
MCV: 98 fL (ref 80–100)
Monocyte #: 0.4 x10 3/mm (ref 0.2–1.0)
Monocyte %: 16.2 %
NEUTROS ABS: 1.2 x10 3/mm — AB (ref 1.4–6.5)
Neutrophil %: 42.2 %
PLATELETS: 165 x10 3/mm (ref 150–440)
RBC: 3.61 10*6/uL — AB (ref 4.40–5.90)
RDW: 14.9 % — AB (ref 11.5–14.5)
WBC: 2.8 x10 3/mm — AB (ref 3.8–10.6)

## 2013-05-21 LAB — PROT IMMUNOELECTROPHORES(ARMC)

## 2013-06-03 LAB — BASIC METABOLIC PANEL
Anion Gap: 8 (ref 7–16)
BUN: 13 mg/dL (ref 7–18)
CHLORIDE: 108 mmol/L — AB (ref 98–107)
CO2: 28 mmol/L (ref 21–32)
Calcium, Total: 8.4 mg/dL — ABNORMAL LOW (ref 8.5–10.1)
Creatinine: 1.29 mg/dL (ref 0.60–1.30)
EGFR (African American): >60
EGFR (Non-African Amer.): 56 — ABNORMAL LOW
GLUCOSE: 100 mg/dL — AB (ref 65–99)
Osmolality: 287 (ref 275–301)
POTASSIUM: 3.5 mmol/L (ref 3.5–5.1)
Sodium: 144 mmol/L (ref 136–145)

## 2013-06-03 LAB — CBC CANCER CENTER
Basophil #: 0 x10 3/mm (ref 0.0–0.1)
Basophil %: 0.9 %
EOS PCT: 6.9 %
Eosinophil #: 0.3 x10 3/mm (ref 0.0–0.7)
HCT: 36 % — ABNORMAL LOW (ref 40.0–52.0)
HGB: 11.9 g/dL — ABNORMAL LOW (ref 13.0–18.0)
Lymphocyte #: 1 x10 3/mm (ref 1.0–3.6)
Lymphocyte %: 26.8 %
MCH: 31.6 pg (ref 26.0–34.0)
MCHC: 33.1 g/dL (ref 32.0–36.0)
MCV: 96 fL (ref 80–100)
MONOS PCT: 12.5 %
Monocyte #: 0.5 x10 3/mm (ref 0.2–1.0)
Neutrophil #: 1.9 x10 3/mm (ref 1.4–6.5)
Neutrophil %: 52.9 %
Platelet: 175 x10 3/mm (ref 150–440)
RBC: 3.77 10*6/uL — ABNORMAL LOW (ref 4.40–5.90)
RDW: 15 % — AB (ref 11.5–14.5)
WBC: 3.7 x10 3/mm — ABNORMAL LOW (ref 3.8–10.6)

## 2013-06-03 LAB — HEPATIC FUNCTION PANEL A (ARMC)
AST: 50 U/L — AB (ref 15–37)
Albumin: 3.1 g/dL — ABNORMAL LOW (ref 3.4–5.0)
Alkaline Phosphatase: 128 U/L — ABNORMAL HIGH
BILIRUBIN DIRECT: 0.2 mg/dL (ref 0.00–0.20)
BILIRUBIN TOTAL: 0.5 mg/dL (ref 0.2–1.0)
SGPT (ALT): 187 U/L — ABNORMAL HIGH (ref 12–78)
Total Protein: 6.9 g/dL (ref 6.4–8.2)

## 2013-06-03 LAB — PHOSPHORUS: Phosphorus: 2.2 mg/dL — ABNORMAL LOW (ref 2.5–4.9)

## 2013-06-03 LAB — MAGNESIUM: Magnesium: 1.2 mg/dL — ABNORMAL LOW

## 2013-06-06 ENCOUNTER — Ambulatory Visit: Payer: Self-pay | Admitting: Internal Medicine

## 2013-06-17 LAB — CBC CANCER CENTER
Basophil #: 0.1 x10 3/mm (ref 0.0–0.1)
Basophil %: 2.8 %
EOS ABS: 0.4 x10 3/mm (ref 0.0–0.7)
Eosinophil %: 13.1 %
HCT: 35.8 % — AB (ref 40.0–52.0)
HGB: 11.8 g/dL — ABNORMAL LOW (ref 13.0–18.0)
LYMPHS ABS: 0.9 x10 3/mm — AB (ref 1.0–3.6)
Lymphocyte %: 31.2 %
MCH: 32 pg (ref 26.0–34.0)
MCHC: 33 g/dL (ref 32.0–36.0)
MCV: 97 fL (ref 80–100)
Monocyte #: 0.6 x10 3/mm (ref 0.2–1.0)
Monocyte %: 23.3 %
Neutrophil #: 0.8 x10 3/mm — ABNORMAL LOW (ref 1.4–6.5)
Neutrophil %: 29.6 %
Platelet: 163 x10 3/mm (ref 150–440)
RBC: 3.69 10*6/uL — ABNORMAL LOW (ref 4.40–5.90)
RDW: 14.7 % — AB (ref 11.5–14.5)
WBC: 2.7 x10 3/mm — ABNORMAL LOW (ref 3.8–10.6)

## 2013-06-17 LAB — HEPATIC FUNCTION PANEL A (ARMC)
Albumin: 3.3 g/dL — ABNORMAL LOW (ref 3.4–5.0)
Alkaline Phosphatase: 67 U/L
BILIRUBIN DIRECT: 0.1 mg/dL (ref 0.00–0.20)
BILIRUBIN TOTAL: 0.6 mg/dL (ref 0.2–1.0)
SGOT(AST): 13 U/L — ABNORMAL LOW (ref 15–37)
SGPT (ALT): 18 U/L (ref 12–78)
TOTAL PROTEIN: 7.2 g/dL (ref 6.4–8.2)

## 2013-06-24 LAB — CBC CANCER CENTER
Basophil #: 0 x10 3/mm (ref 0.0–0.1)
Basophil %: 1.4 %
Eosinophil #: 0.1 x10 3/mm (ref 0.0–0.7)
Eosinophil %: 4.6 %
HCT: 36.3 % — ABNORMAL LOW (ref 40.0–52.0)
HGB: 12.3 g/dL — AB (ref 13.0–18.0)
LYMPHS ABS: 1 x10 3/mm (ref 1.0–3.6)
LYMPHS PCT: 32.3 %
MCH: 31.9 pg (ref 26.0–34.0)
MCHC: 33.8 g/dL (ref 32.0–36.0)
MCV: 94 fL (ref 80–100)
MONO ABS: 0.3 x10 3/mm (ref 0.2–1.0)
Monocyte %: 9.2 %
Neutrophil #: 1.7 x10 3/mm (ref 1.4–6.5)
Neutrophil %: 52.5 %
PLATELETS: 208 x10 3/mm (ref 150–440)
RBC: 3.85 10*6/uL — ABNORMAL LOW (ref 4.40–5.90)
RDW: 15.1 % — ABNORMAL HIGH (ref 11.5–14.5)
WBC: 3.2 x10 3/mm — ABNORMAL LOW (ref 3.8–10.6)

## 2013-07-01 LAB — CBC CANCER CENTER
Basophil #: 0 x10 3/mm (ref 0.0–0.1)
Basophil %: 1 %
Eosinophil #: 0.1 x10 3/mm (ref 0.0–0.7)
Eosinophil %: 5 %
HCT: 35.6 % — ABNORMAL LOW (ref 40.0–52.0)
HGB: 11.7 g/dL — ABNORMAL LOW (ref 13.0–18.0)
Lymphocyte #: 0.9 x10 3/mm — ABNORMAL LOW (ref 1.0–3.6)
Lymphocyte %: 31.4 %
MCH: 31.9 pg (ref 26.0–34.0)
MCHC: 33 g/dL (ref 32.0–36.0)
MCV: 97 fL (ref 80–100)
MONOS PCT: 13.2 %
Monocyte #: 0.4 x10 3/mm (ref 0.2–1.0)
NEUTROS ABS: 1.4 x10 3/mm (ref 1.4–6.5)
Neutrophil %: 49.4 %
Platelet: 181 x10 3/mm (ref 150–440)
RBC: 3.68 10*6/uL — AB (ref 4.40–5.90)
RDW: 15.2 % — ABNORMAL HIGH (ref 11.5–14.5)
WBC: 2.9 x10 3/mm — ABNORMAL LOW (ref 3.8–10.6)

## 2013-07-01 LAB — CREATININE, SERUM
CREATININE: 1.35 mg/dL — AB (ref 0.60–1.30)
EGFR (African American): >60
GFR CALC NON AF AMER: 53 — AB

## 2013-07-01 LAB — HEPATIC FUNCTION PANEL A (ARMC)
Albumin: 3.2 g/dL — ABNORMAL LOW (ref 3.4–5.0)
Alkaline Phosphatase: 58 U/L
Bilirubin, Direct: 0.1 mg/dL (ref 0.00–0.20)
Bilirubin,Total: 0.4 mg/dL (ref 0.2–1.0)
SGOT(AST): 15 U/L (ref 15–37)
SGPT (ALT): 12 U/L (ref 12–78)
Total Protein: 7.3 g/dL (ref 6.4–8.2)

## 2013-07-01 LAB — PHOSPHORUS: Phosphorus: 2.1 mg/dL — ABNORMAL LOW (ref 2.5–4.9)

## 2013-07-01 LAB — MAGNESIUM: Magnesium: 1.7 mg/dL — ABNORMAL LOW

## 2013-07-07 ENCOUNTER — Ambulatory Visit: Payer: Self-pay | Admitting: Internal Medicine

## 2013-07-15 LAB — CBC CANCER CENTER
BASOS PCT: 0.5 %
Basophil #: 0 x10 3/mm (ref 0.0–0.1)
EOS ABS: 0.2 x10 3/mm (ref 0.0–0.7)
EOS PCT: 5.6 %
HCT: 36.4 % — ABNORMAL LOW (ref 40.0–52.0)
HGB: 12 g/dL — ABNORMAL LOW (ref 13.0–18.0)
LYMPHS ABS: 1 x10 3/mm (ref 1.0–3.6)
Lymphocyte %: 25.7 %
MCH: 31.9 pg (ref 26.0–34.0)
MCHC: 33 g/dL (ref 32.0–36.0)
MCV: 97 fL (ref 80–100)
Monocyte #: 0.6 x10 3/mm (ref 0.2–1.0)
Monocyte %: 17.2 %
NEUTROS PCT: 51 %
Neutrophil #: 1.9 x10 3/mm (ref 1.4–6.5)
Platelet: 156 x10 3/mm (ref 150–440)
RBC: 3.77 10*6/uL — AB (ref 4.40–5.90)
RDW: 15.2 % — AB (ref 11.5–14.5)
WBC: 3.7 x10 3/mm — AB (ref 3.8–10.6)

## 2013-07-15 LAB — HEPATIC FUNCTION PANEL A (ARMC)
AST: 12 U/L — AB (ref 15–37)
Albumin: 3.2 g/dL — ABNORMAL LOW (ref 3.4–5.0)
Alkaline Phosphatase: 50 U/L
BILIRUBIN TOTAL: 0.5 mg/dL (ref 0.2–1.0)
Bilirubin, Direct: 0.1 mg/dL (ref 0.00–0.20)
SGPT (ALT): 17 U/L (ref 12–78)
TOTAL PROTEIN: 7.2 g/dL (ref 6.4–8.2)

## 2013-07-29 LAB — CBC CANCER CENTER
BASOS ABS: 0 x10 3/mm (ref 0.0–0.1)
BASOS PCT: 0.9 %
Eosinophil #: 0.1 x10 3/mm (ref 0.0–0.7)
Eosinophil %: 5.4 %
HCT: 34.7 % — ABNORMAL LOW (ref 40.0–52.0)
HGB: 11.4 g/dL — AB (ref 13.0–18.0)
LYMPHS ABS: 0.8 x10 3/mm — AB (ref 1.0–3.6)
LYMPHS PCT: 28.1 %
MCH: 31.6 pg (ref 26.0–34.0)
MCHC: 32.8 g/dL (ref 32.0–36.0)
MCV: 97 fL (ref 80–100)
Monocyte #: 0.3 x10 3/mm (ref 0.2–1.0)
Monocyte %: 11.3 %
NEUTROS PCT: 54.3 %
Neutrophil #: 1.5 x10 3/mm (ref 1.4–6.5)
PLATELETS: 174 x10 3/mm (ref 150–440)
RBC: 3.59 10*6/uL — ABNORMAL LOW (ref 4.40–5.90)
RDW: 15.2 % — AB (ref 11.5–14.5)
WBC: 2.7 x10 3/mm — ABNORMAL LOW (ref 3.8–10.6)

## 2013-07-29 LAB — BASIC METABOLIC PANEL
ANION GAP: 5 — AB (ref 7–16)
BUN: 13 mg/dL (ref 7–18)
CREATININE: 1.28 mg/dL (ref 0.60–1.30)
Calcium, Total: 8.7 mg/dL (ref 8.5–10.1)
Chloride: 107 mmol/L (ref 98–107)
Co2: 28 mmol/L (ref 21–32)
EGFR (Non-African Amer.): 57 — ABNORMAL LOW
Glucose: 139 mg/dL — ABNORMAL HIGH (ref 65–99)
OSMOLALITY: 282 (ref 275–301)
POTASSIUM: 3.9 mmol/L (ref 3.5–5.1)
SODIUM: 140 mmol/L (ref 136–145)

## 2013-07-29 LAB — MAGNESIUM: MAGNESIUM: 1.6 mg/dL — AB

## 2013-07-29 LAB — HEPATIC FUNCTION PANEL A (ARMC)
ALT: 14 U/L (ref 12–78)
AST: 21 U/L (ref 15–37)
Albumin: 3.1 g/dL — ABNORMAL LOW (ref 3.4–5.0)
Alkaline Phosphatase: 50 U/L
Bilirubin, Direct: 0.1 mg/dL (ref 0.00–0.20)
Bilirubin,Total: 0.4 mg/dL (ref 0.2–1.0)
Total Protein: 6.9 g/dL (ref 6.4–8.2)

## 2013-07-29 LAB — PHOSPHORUS: Phosphorus: 2.1 mg/dL — ABNORMAL LOW (ref 2.5–4.9)

## 2013-07-31 LAB — PROT IMMUNOELECTROPHORES(ARMC)

## 2013-08-06 ENCOUNTER — Ambulatory Visit: Payer: Self-pay | Admitting: Internal Medicine

## 2013-08-12 LAB — CBC CANCER CENTER
Basophil #: 0.1 x10 3/mm (ref 0.0–0.1)
Basophil %: 2.6 %
EOS PCT: 12.2 %
Eosinophil #: 0.3 x10 3/mm (ref 0.0–0.7)
HCT: 36.5 % — ABNORMAL LOW (ref 40.0–52.0)
HGB: 12.1 g/dL — AB (ref 13.0–18.0)
Lymphocyte #: 1 x10 3/mm (ref 1.0–3.6)
Lymphocyte %: 39.5 %
MCH: 31.6 pg (ref 26.0–34.0)
MCHC: 33.1 g/dL (ref 32.0–36.0)
MCV: 96 fL (ref 80–100)
MONOS PCT: 31.6 %
Monocyte #: 0.8 x10 3/mm (ref 0.2–1.0)
Neutrophil #: 0.4 x10 3/mm — ABNORMAL LOW (ref 1.4–6.5)
Neutrophil %: 14.1 %
Platelet: 194 x10 3/mm (ref 150–440)
RBC: 3.83 10*6/uL — ABNORMAL LOW (ref 4.40–5.90)
RDW: 15.6 % — AB (ref 11.5–14.5)
WBC: 2.5 x10 3/mm — ABNORMAL LOW (ref 3.8–10.6)

## 2013-08-12 LAB — HEPATIC FUNCTION PANEL A (ARMC)
ALBUMIN: 3.2 g/dL — AB (ref 3.4–5.0)
ALT: 15 U/L (ref 12–78)
AST: 12 U/L — AB (ref 15–37)
Alkaline Phosphatase: 56 U/L
BILIRUBIN DIRECT: 0.1 mg/dL (ref 0.00–0.20)
Bilirubin,Total: 0.3 mg/dL (ref 0.2–1.0)
TOTAL PROTEIN: 7.6 g/dL (ref 6.4–8.2)

## 2013-08-26 LAB — CBC CANCER CENTER
BASOS ABS: 0 x10 3/mm (ref 0.0–0.1)
Basophil %: 1.1 %
EOS PCT: 4.2 %
Eosinophil #: 0.2 x10 3/mm (ref 0.0–0.7)
HCT: 36.7 % — ABNORMAL LOW (ref 40.0–52.0)
HGB: 11.9 g/dL — ABNORMAL LOW (ref 13.0–18.0)
LYMPHS PCT: 32.5 %
Lymphocyte #: 1.2 x10 3/mm (ref 1.0–3.6)
MCH: 31 pg (ref 26.0–34.0)
MCHC: 32.3 g/dL (ref 32.0–36.0)
MCV: 96 fL (ref 80–100)
MONO ABS: 0.7 x10 3/mm (ref 0.2–1.0)
MONOS PCT: 19.6 %
Neutrophil #: 1.6 x10 3/mm (ref 1.4–6.5)
Neutrophil %: 42.6 %
Platelet: 201 x10 3/mm (ref 150–440)
RBC: 3.83 10*6/uL — AB (ref 4.40–5.90)
RDW: 16.3 % — ABNORMAL HIGH (ref 11.5–14.5)
WBC: 3.7 x10 3/mm — ABNORMAL LOW (ref 3.8–10.6)

## 2013-08-26 LAB — HEPATIC FUNCTION PANEL A (ARMC)
ALBUMIN: 3.2 g/dL — AB (ref 3.4–5.0)
Alkaline Phosphatase: 51 U/L
Bilirubin, Direct: 0.1 mg/dL (ref 0.00–0.20)
Bilirubin,Total: 0.4 mg/dL (ref 0.2–1.0)
SGOT(AST): 16 U/L (ref 15–37)
SGPT (ALT): 18 U/L (ref 12–78)
Total Protein: 7.5 g/dL (ref 6.4–8.2)

## 2013-08-26 LAB — CREATININE, SERUM
CREATININE: 1.73 mg/dL — AB (ref 0.60–1.30)
EGFR (African American): 46 — ABNORMAL LOW
EGFR (Non-African Amer.): 39 — ABNORMAL LOW

## 2013-08-26 LAB — PHOSPHORUS: Phosphorus: 3.3 mg/dL (ref 2.5–4.9)

## 2013-08-26 LAB — MAGNESIUM: MAGNESIUM: 1.6 mg/dL — AB

## 2013-08-29 LAB — BASIC METABOLIC PANEL
Anion Gap: 7 (ref 7–16)
BUN: 23 mg/dL — ABNORMAL HIGH (ref 7–18)
CALCIUM: 8.7 mg/dL (ref 8.5–10.1)
CHLORIDE: 104 mmol/L (ref 98–107)
CO2: 28 mmol/L (ref 21–32)
Creatinine: 1.59 mg/dL — ABNORMAL HIGH (ref 0.60–1.30)
EGFR (African American): 51 — ABNORMAL LOW
GFR CALC NON AF AMER: 44 — AB
GLUCOSE: 118 mg/dL — AB (ref 65–99)
OSMOLALITY: 282 (ref 275–301)
Potassium: 4.2 mmol/L (ref 3.5–5.1)
SODIUM: 139 mmol/L (ref 136–145)

## 2013-09-06 ENCOUNTER — Ambulatory Visit: Payer: Self-pay | Admitting: Internal Medicine

## 2013-09-09 LAB — CBC CANCER CENTER
BASOS PCT: 0.7 %
Basophil #: 0 x10 3/mm (ref 0.0–0.1)
EOS ABS: 0.3 x10 3/mm (ref 0.0–0.7)
EOS PCT: 8.5 %
HCT: 37.4 % — AB (ref 40.0–52.0)
HGB: 12.2 g/dL — AB (ref 13.0–18.0)
LYMPHS ABS: 1.2 x10 3/mm (ref 1.0–3.6)
Lymphocyte %: 35.1 %
MCH: 31.4 pg (ref 26.0–34.0)
MCHC: 32.5 g/dL (ref 32.0–36.0)
MCV: 97 fL (ref 80–100)
Monocyte #: 0.5 x10 3/mm (ref 0.2–1.0)
Monocyte %: 13.8 %
NEUTROS ABS: 1.4 x10 3/mm (ref 1.4–6.5)
NEUTROS PCT: 41.9 %
Platelet: 219 x10 3/mm (ref 150–440)
RBC: 3.87 10*6/uL — ABNORMAL LOW (ref 4.40–5.90)
RDW: 16.4 % — ABNORMAL HIGH (ref 11.5–14.5)
WBC: 3.3 x10 3/mm — AB (ref 3.8–10.6)

## 2013-09-09 LAB — HEPATIC FUNCTION PANEL A (ARMC)
ALT: 22 U/L
Albumin: 3.3 g/dL — ABNORMAL LOW (ref 3.4–5.0)
Alkaline Phosphatase: 46 U/L
BILIRUBIN TOTAL: 0.4 mg/dL (ref 0.2–1.0)
SGOT(AST): 20 U/L (ref 15–37)
TOTAL PROTEIN: 7.5 g/dL (ref 6.4–8.2)

## 2013-09-09 LAB — CREATININE, SERUM: Creatinine: 1.2 mg/dL (ref 0.60–1.30)

## 2013-09-23 LAB — CBC CANCER CENTER
Basophil #: 0 x10 3/mm (ref 0.0–0.1)
Basophil %: 1.2 %
Eosinophil #: 0.2 x10 3/mm (ref 0.0–0.7)
Eosinophil %: 7.7 %
HCT: 36.4 % — ABNORMAL LOW (ref 40.0–52.0)
HGB: 11.7 g/dL — ABNORMAL LOW (ref 13.0–18.0)
LYMPHS ABS: 1 x10 3/mm (ref 1.0–3.6)
LYMPHS PCT: 44.5 %
MCH: 31.4 pg (ref 26.0–34.0)
MCHC: 32.3 g/dL (ref 32.0–36.0)
MCV: 97 fL (ref 80–100)
MONOS PCT: 28.4 %
Monocyte #: 0.7 x10 3/mm (ref 0.2–1.0)
NEUTROS ABS: 0.4 x10 3/mm — AB (ref 1.4–6.5)
Neutrophil %: 18.2 %
PLATELETS: 160 x10 3/mm (ref 150–440)
RBC: 3.75 10*6/uL — ABNORMAL LOW (ref 4.40–5.90)
RDW: 16.7 % — AB (ref 11.5–14.5)
WBC: 2.3 x10 3/mm — ABNORMAL LOW (ref 3.8–10.6)

## 2013-09-23 LAB — BASIC METABOLIC PANEL
Anion Gap: 9 (ref 7–16)
BUN: 14 mg/dL (ref 7–18)
CO2: 28 mmol/L (ref 21–32)
Calcium, Total: 8.4 mg/dL — ABNORMAL LOW (ref 8.5–10.1)
Chloride: 106 mmol/L (ref 98–107)
Creatinine: 1.5 mg/dL — ABNORMAL HIGH (ref 0.60–1.30)
EGFR (African American): 54 — ABNORMAL LOW
GFR CALC NON AF AMER: 47 — AB
Glucose: 82 mg/dL (ref 65–99)
Osmolality: 285 (ref 275–301)
Potassium: 4.6 mmol/L (ref 3.5–5.1)
Sodium: 143 mmol/L (ref 136–145)

## 2013-09-23 LAB — HEPATIC FUNCTION PANEL A (ARMC)
Albumin: 3.1 g/dL — ABNORMAL LOW (ref 3.4–5.0)
Alkaline Phosphatase: 51 U/L
Bilirubin,Total: 0.4 mg/dL (ref 0.2–1.0)
SGOT(AST): 14 U/L — ABNORMAL LOW (ref 15–37)
SGPT (ALT): 21 U/L
TOTAL PROTEIN: 7.2 g/dL (ref 6.4–8.2)

## 2013-09-30 LAB — CBC CANCER CENTER
Basophil #: 0 x10 3/mm (ref 0.0–0.1)
Basophil %: 1.5 %
EOS ABS: 0.1 x10 3/mm (ref 0.0–0.7)
Eosinophil %: 4.2 %
HCT: 37.7 % — AB (ref 40.0–52.0)
HGB: 12.2 g/dL — ABNORMAL LOW (ref 13.0–18.0)
LYMPHS PCT: 43.1 %
Lymphocyte #: 1.2 x10 3/mm (ref 1.0–3.6)
MCH: 31.3 pg (ref 26.0–34.0)
MCHC: 32.4 g/dL (ref 32.0–36.0)
MCV: 97 fL (ref 80–100)
MONO ABS: 0.6 x10 3/mm (ref 0.2–1.0)
Monocyte %: 22.2 %
NEUTROS PCT: 29 %
Neutrophil #: 0.8 x10 3/mm — ABNORMAL LOW (ref 1.4–6.5)
PLATELETS: 210 x10 3/mm (ref 150–440)
RBC: 3.89 10*6/uL — ABNORMAL LOW (ref 4.40–5.90)
RDW: 16.4 % — ABNORMAL HIGH (ref 11.5–14.5)
WBC: 2.8 x10 3/mm — ABNORMAL LOW (ref 3.8–10.6)

## 2013-10-07 ENCOUNTER — Ambulatory Visit: Payer: Self-pay | Admitting: Internal Medicine

## 2013-10-07 LAB — CBC CANCER CENTER
BASOS PCT: 1.8 %
Basophil #: 0.1 x10 3/mm (ref 0.0–0.1)
Eosinophil #: 0.1 x10 3/mm (ref 0.0–0.7)
Eosinophil %: 4.9 %
HCT: 39.3 % — AB (ref 40.0–52.0)
HGB: 12.8 g/dL — ABNORMAL LOW (ref 13.0–18.0)
Lymphocyte #: 1.1 x10 3/mm (ref 1.0–3.6)
Lymphocyte %: 36.4 %
MCH: 31.5 pg (ref 26.0–34.0)
MCHC: 32.6 g/dL (ref 32.0–36.0)
MCV: 97 fL (ref 80–100)
MONOS PCT: 16 %
Monocyte #: 0.5 x10 3/mm (ref 0.2–1.0)
Neutrophil #: 1.2 x10 3/mm — ABNORMAL LOW (ref 1.4–6.5)
Neutrophil %: 40.9 %
Platelet: 230 x10 3/mm (ref 150–440)
RBC: 4.05 10*6/uL — ABNORMAL LOW (ref 4.40–5.90)
RDW: 16.6 % — ABNORMAL HIGH (ref 11.5–14.5)
WBC: 2.9 x10 3/mm — ABNORMAL LOW (ref 3.8–10.6)

## 2013-10-07 LAB — HEPATIC FUNCTION PANEL A (ARMC)
Albumin: 3.5 g/dL (ref 3.4–5.0)
Alkaline Phosphatase: 50 U/L
Bilirubin, Direct: 0.1 mg/dL (ref 0.00–0.20)
Bilirubin,Total: 0.3 mg/dL (ref 0.2–1.0)
SGOT(AST): 18 U/L (ref 15–37)
SGPT (ALT): 21 U/L
Total Protein: 8 g/dL (ref 6.4–8.2)

## 2013-10-21 LAB — CBC CANCER CENTER
BASOS ABS: 0 x10 3/mm (ref 0.0–0.1)
Basophil %: 0.5 %
EOS PCT: 10.3 %
Eosinophil #: 0.4 x10 3/mm (ref 0.0–0.7)
HCT: 38.8 % — ABNORMAL LOW (ref 40.0–52.0)
HGB: 12.5 g/dL — ABNORMAL LOW (ref 13.0–18.0)
LYMPHS ABS: 1 x10 3/mm (ref 1.0–3.6)
Lymphocyte %: 24.4 %
MCH: 31.3 pg (ref 26.0–34.0)
MCHC: 32.2 g/dL (ref 32.0–36.0)
MCV: 97 fL (ref 80–100)
MONO ABS: 0.7 x10 3/mm (ref 0.2–1.0)
Monocyte %: 16.8 %
NEUTROS PCT: 48 %
Neutrophil #: 1.9 x10 3/mm (ref 1.4–6.5)
Platelet: 210 x10 3/mm (ref 150–440)
RBC: 3.98 10*6/uL — AB (ref 4.40–5.90)
RDW: 16.3 % — AB (ref 11.5–14.5)
WBC: 3.9 x10 3/mm (ref 3.8–10.6)

## 2013-10-21 LAB — CREATININE, SERUM
Creatinine: 1.49 mg/dL — ABNORMAL HIGH (ref 0.60–1.30)
EGFR (African American): 55 — ABNORMAL LOW
EGFR (Non-African Amer.): 47 — ABNORMAL LOW

## 2013-10-21 LAB — PHOSPHORUS: PHOSPHORUS: 2.8 mg/dL (ref 2.5–4.9)

## 2013-10-21 LAB — HEPATIC FUNCTION PANEL A (ARMC)
ALBUMIN: 3.3 g/dL — AB (ref 3.4–5.0)
ALT: 21 U/L
AST: 18 U/L (ref 15–37)
Alkaline Phosphatase: 47 U/L
BILIRUBIN DIRECT: 0.1 mg/dL (ref 0.00–0.20)
Bilirubin,Total: 0.4 mg/dL (ref 0.2–1.0)
Total Protein: 7.5 g/dL (ref 6.4–8.2)

## 2013-10-21 LAB — MAGNESIUM: MAGNESIUM: 1.6 mg/dL — AB

## 2013-11-04 LAB — HEPATIC FUNCTION PANEL A (ARMC)
ALT: 21 U/L
Albumin: 3.3 g/dL — ABNORMAL LOW (ref 3.4–5.0)
Alkaline Phosphatase: 48 U/L
BILIRUBIN TOTAL: 0.5 mg/dL (ref 0.2–1.0)
Bilirubin, Direct: 0.1 mg/dL (ref 0.00–0.20)
SGOT(AST): 17 U/L (ref 15–37)
TOTAL PROTEIN: 7.2 g/dL (ref 6.4–8.2)

## 2013-11-04 LAB — CBC CANCER CENTER
Basophil #: 0 x10 3/mm (ref 0.0–0.1)
Basophil %: 1.2 %
EOS PCT: 9.9 %
Eosinophil #: 0.2 x10 3/mm (ref 0.0–0.7)
HCT: 37.6 % — ABNORMAL LOW (ref 40.0–52.0)
HGB: 12.1 g/dL — ABNORMAL LOW (ref 13.0–18.0)
LYMPHS PCT: 36.3 %
Lymphocyte #: 0.8 x10 3/mm — ABNORMAL LOW (ref 1.0–3.6)
MCH: 31.5 pg (ref 26.0–34.0)
MCHC: 32.2 g/dL (ref 32.0–36.0)
MCV: 98 fL (ref 80–100)
MONO ABS: 0.6 x10 3/mm (ref 0.2–1.0)
Monocyte %: 25.8 %
NEUTROS ABS: 0.6 x10 3/mm — AB (ref 1.4–6.5)
NEUTROS PCT: 26.8 %
PLATELETS: 198 x10 3/mm (ref 150–440)
RBC: 3.84 10*6/uL — ABNORMAL LOW (ref 4.40–5.90)
RDW: 16 % — ABNORMAL HIGH (ref 11.5–14.5)
WBC: 2.3 x10 3/mm — AB (ref 3.8–10.6)

## 2013-11-06 ENCOUNTER — Ambulatory Visit: Payer: Self-pay | Admitting: Internal Medicine

## 2013-11-11 LAB — CBC CANCER CENTER
BASOS ABS: 0.1 x10 3/mm (ref 0.0–0.1)
Basophil %: 3 %
EOS ABS: 0.1 x10 3/mm (ref 0.0–0.7)
EOS PCT: 4.2 %
HCT: 37.5 % — AB (ref 40.0–52.0)
HGB: 11.8 g/dL — AB (ref 13.0–18.0)
Lymphocyte #: 1 x10 3/mm (ref 1.0–3.6)
Lymphocyte %: 36.9 %
MCH: 30.7 pg (ref 26.0–34.0)
MCHC: 31.4 g/dL — ABNORMAL LOW (ref 32.0–36.0)
MCV: 98 fL (ref 80–100)
Monocyte #: 0.6 x10 3/mm (ref 0.2–1.0)
Monocyte %: 22.4 %
NEUTROS ABS: 0.9 x10 3/mm — AB (ref 1.4–6.5)
NEUTROS PCT: 33.5 %
Platelet: 209 x10 3/mm (ref 150–440)
RBC: 3.83 10*6/uL — ABNORMAL LOW (ref 4.40–5.90)
RDW: 16.1 % — AB (ref 11.5–14.5)
WBC: 2.8 x10 3/mm — ABNORMAL LOW (ref 3.8–10.6)

## 2013-11-18 LAB — HEPATIC FUNCTION PANEL A (ARMC)
ALBUMIN: 3.4 g/dL (ref 3.4–5.0)
Alkaline Phosphatase: 52 U/L
BILIRUBIN DIRECT: 0.1 mg/dL (ref 0.00–0.20)
Bilirubin,Total: 0.5 mg/dL (ref 0.2–1.0)
SGOT(AST): 21 U/L (ref 15–37)
SGPT (ALT): 17 U/L
TOTAL PROTEIN: 7.9 g/dL (ref 6.4–8.2)

## 2013-11-18 LAB — BASIC METABOLIC PANEL
Anion Gap: 8 (ref 7–16)
BUN: 26 mg/dL — AB (ref 7–18)
CO2: 27 mmol/L (ref 21–32)
CREATININE: 1.66 mg/dL — AB (ref 0.60–1.30)
Calcium, Total: 8.6 mg/dL (ref 8.5–10.1)
Chloride: 109 mmol/L — ABNORMAL HIGH (ref 98–107)
GFR CALC AF AMER: 53 — AB
GFR CALC NON AF AMER: 44 — AB
Glucose: 83 mg/dL (ref 65–99)
OSMOLALITY: 291 (ref 275–301)
POTASSIUM: 4.1 mmol/L (ref 3.5–5.1)
Sodium: 144 mmol/L (ref 136–145)

## 2013-11-18 LAB — CBC CANCER CENTER
Basophil #: 0 x10 3/mm (ref 0.0–0.1)
Basophil %: 0.9 %
Eosinophil #: 0.4 x10 3/mm (ref 0.0–0.7)
Eosinophil %: 8.2 %
HCT: 38.1 % — AB (ref 40.0–52.0)
HGB: 12.1 g/dL — ABNORMAL LOW (ref 13.0–18.0)
Lymphocyte #: 0.7 x10 3/mm — ABNORMAL LOW (ref 1.0–3.6)
Lymphocyte %: 17.1 %
MCH: 31.2 pg (ref 26.0–34.0)
MCHC: 31.6 g/dL — ABNORMAL LOW (ref 32.0–36.0)
MCV: 99 fL (ref 80–100)
MONOS PCT: 12.8 %
Monocyte #: 0.6 x10 3/mm (ref 0.2–1.0)
Neutrophil #: 2.6 x10 3/mm (ref 1.4–6.5)
Neutrophil %: 61 %
PLATELETS: 195 x10 3/mm (ref 150–440)
RBC: 3.86 10*6/uL — AB (ref 4.40–5.90)
RDW: 15.8 % — ABNORMAL HIGH (ref 11.5–14.5)
WBC: 4.3 x10 3/mm (ref 3.8–10.6)

## 2013-11-18 LAB — MAGNESIUM: MAGNESIUM: 1.8 mg/dL

## 2013-11-18 LAB — PHOSPHORUS: Phosphorus: 2.7 mg/dL (ref 2.5–4.9)

## 2013-11-19 LAB — KAPPA/LAMBDA FREE LIGHT CHAINS (ARMC)

## 2013-11-19 LAB — PROT IMMUNOELECTROPHORES(ARMC)

## 2013-12-02 LAB — CBC CANCER CENTER
BASOS ABS: 0 x10 3/mm (ref 0.0–0.1)
Basophil %: 0.4 %
Eosinophil #: 0.5 x10 3/mm (ref 0.0–0.7)
Eosinophil %: 11.3 %
HCT: 37.5 % — AB (ref 40.0–52.0)
HGB: 12.1 g/dL — AB (ref 13.0–18.0)
LYMPHS ABS: 0.8 x10 3/mm — AB (ref 1.0–3.6)
LYMPHS PCT: 17.7 %
MCH: 31.4 pg (ref 26.0–34.0)
MCHC: 32.3 g/dL (ref 32.0–36.0)
MCV: 97 fL (ref 80–100)
Monocyte #: 0.6 x10 3/mm (ref 0.2–1.0)
Monocyte %: 13.9 %
NEUTROS PCT: 56.7 %
Neutrophil #: 2.6 x10 3/mm (ref 1.4–6.5)
Platelet: 196 x10 3/mm (ref 150–440)
RBC: 3.85 10*6/uL — ABNORMAL LOW (ref 4.40–5.90)
RDW: 14.8 % — ABNORMAL HIGH (ref 11.5–14.5)
WBC: 4.6 x10 3/mm (ref 3.8–10.6)

## 2013-12-02 LAB — CREATININE, SERUM
Creatinine: 1.4 mg/dL — ABNORMAL HIGH (ref 0.60–1.30)
EGFR (African American): >60
EGFR (Non-African Amer.): 53 — ABNORMAL LOW

## 2013-12-05 DIAGNOSIS — I10 Essential (primary) hypertension: Secondary | ICD-10-CM | POA: Insufficient documentation

## 2013-12-05 DIAGNOSIS — E039 Hypothyroidism, unspecified: Secondary | ICD-10-CM | POA: Insufficient documentation

## 2013-12-05 DIAGNOSIS — G4733 Obstructive sleep apnea (adult) (pediatric): Secondary | ICD-10-CM | POA: Insufficient documentation

## 2013-12-05 DIAGNOSIS — N289 Disorder of kidney and ureter, unspecified: Secondary | ICD-10-CM | POA: Insufficient documentation

## 2013-12-07 ENCOUNTER — Ambulatory Visit: Payer: Self-pay | Admitting: Internal Medicine

## 2013-12-16 LAB — CBC CANCER CENTER
Basophil #: 0 x10 3/mm (ref 0.0–0.1)
Basophil %: 1 %
EOS ABS: 0.1 x10 3/mm (ref 0.0–0.7)
Eosinophil %: 7.1 %
HCT: 40.4 % (ref 40.0–52.0)
HGB: 13.1 g/dL (ref 13.0–18.0)
LYMPHS ABS: 1 x10 3/mm (ref 1.0–3.6)
Lymphocyte %: 46.6 %
MCH: 31.4 pg (ref 26.0–34.0)
MCHC: 32.4 g/dL (ref 32.0–36.0)
MCV: 97 fL (ref 80–100)
Monocyte #: 0.6 x10 3/mm (ref 0.2–1.0)
Monocyte %: 26.8 %
NEUTROS ABS: 0.4 x10 3/mm — AB (ref 1.4–6.5)
NEUTROS PCT: 18.5 %
Platelet: 180 x10 3/mm (ref 150–440)
RBC: 4.16 10*6/uL — ABNORMAL LOW (ref 4.40–5.90)
RDW: 15.2 % — ABNORMAL HIGH (ref 11.5–14.5)
WBC: 2.1 x10 3/mm — AB (ref 3.8–10.6)

## 2013-12-16 LAB — PHOSPHORUS: Phosphorus: 2.6 mg/dL (ref 2.5–4.9)

## 2013-12-16 LAB — CREATININE, SERUM
Creatinine: 1.22 mg/dL (ref 0.60–1.30)
EGFR (African American): >60
EGFR (Non-African Amer.): >60

## 2013-12-16 LAB — MAGNESIUM: Magnesium: 1.5 mg/dL — ABNORMAL LOW

## 2013-12-23 LAB — CBC CANCER CENTER
BASOS PCT: 2.7 %
Basophil #: 0.1 x10 3/mm (ref 0.0–0.1)
Eosinophil #: 0.1 x10 3/mm (ref 0.0–0.7)
Eosinophil %: 2.5 %
HCT: 39.9 % — ABNORMAL LOW (ref 40.0–52.0)
HGB: 12.8 g/dL — ABNORMAL LOW (ref 13.0–18.0)
Lymphocyte #: 1 x10 3/mm (ref 1.0–3.6)
Lymphocyte %: 38.7 %
MCH: 31.2 pg (ref 26.0–34.0)
MCHC: 32.1 g/dL (ref 32.0–36.0)
MCV: 97 fL (ref 80–100)
MONOS PCT: 24.8 %
Monocyte #: 0.7 x10 3/mm (ref 0.2–1.0)
NEUTROS PCT: 31.3 %
Neutrophil #: 0.8 x10 3/mm — ABNORMAL LOW (ref 1.4–6.5)
PLATELETS: 227 x10 3/mm (ref 150–440)
RBC: 4.09 10*6/uL — ABNORMAL LOW (ref 4.40–5.90)
RDW: 15.3 % — ABNORMAL HIGH (ref 11.5–14.5)
WBC: 2.7 x10 3/mm — ABNORMAL LOW (ref 3.8–10.6)

## 2013-12-30 LAB — CBC CANCER CENTER
Basophil #: 0 x10 3/mm (ref 0.0–0.1)
Basophil %: 0.9 %
Eosinophil #: 0.1 x10 3/mm (ref 0.0–0.7)
Eosinophil %: 4 %
HCT: 40.9 % (ref 40.0–52.0)
HGB: 13.2 g/dL (ref 13.0–18.0)
LYMPHS PCT: 33.5 %
Lymphocyte #: 1.2 x10 3/mm (ref 1.0–3.6)
MCH: 31.2 pg (ref 26.0–34.0)
MCHC: 32.2 g/dL (ref 32.0–36.0)
MCV: 97 fL (ref 80–100)
MONO ABS: 0.3 x10 3/mm (ref 0.2–1.0)
Monocyte %: 9.8 %
NEUTROS ABS: 1.8 x10 3/mm (ref 1.4–6.5)
Neutrophil %: 51.8 %
PLATELETS: 232 x10 3/mm (ref 150–440)
RBC: 4.21 10*6/uL — ABNORMAL LOW (ref 4.40–5.90)
RDW: 15.2 % — AB (ref 11.5–14.5)
WBC: 3.5 x10 3/mm — ABNORMAL LOW (ref 3.8–10.6)

## 2014-01-06 ENCOUNTER — Ambulatory Visit: Payer: Self-pay | Admitting: Internal Medicine

## 2014-01-13 LAB — CBC CANCER CENTER
Basophil #: 0 x10 3/mm (ref 0.0–0.1)
Basophil %: 0.7 %
EOS PCT: 8.6 %
Eosinophil #: 0.3 x10 3/mm (ref 0.0–0.7)
HCT: 38.4 % — AB (ref 40.0–52.0)
HGB: 12.6 g/dL — AB (ref 13.0–18.0)
LYMPHS PCT: 29.1 %
Lymphocyte #: 1.1 x10 3/mm (ref 1.0–3.6)
MCH: 31 pg (ref 26.0–34.0)
MCHC: 32.8 g/dL (ref 32.0–36.0)
MCV: 95 fL (ref 80–100)
MONO ABS: 0.5 x10 3/mm (ref 0.2–1.0)
Monocyte %: 12.1 %
Neutrophil #: 1.9 x10 3/mm (ref 1.4–6.5)
Neutrophil %: 49.5 %
Platelet: 204 x10 3/mm (ref 150–440)
RBC: 4.06 10*6/uL — ABNORMAL LOW (ref 4.40–5.90)
RDW: 15.2 % — ABNORMAL HIGH (ref 11.5–14.5)
WBC: 3.7 x10 3/mm — ABNORMAL LOW (ref 3.8–10.6)

## 2014-01-13 LAB — BASIC METABOLIC PANEL
Anion Gap: 5 — ABNORMAL LOW (ref 7–16)
BUN: 25 mg/dL — AB (ref 7–18)
CHLORIDE: 106 mmol/L (ref 98–107)
CO2: 30 mmol/L (ref 21–32)
Calcium, Total: 9.5 mg/dL (ref 8.5–10.1)
Creatinine: 1.6 mg/dL — ABNORMAL HIGH (ref 0.60–1.30)
EGFR (African American): 55 — ABNORMAL LOW
EGFR (Non-African Amer.): 46 — ABNORMAL LOW
Glucose: 91 mg/dL (ref 65–99)
Osmolality: 285 (ref 275–301)
Potassium: 4.2 mmol/L (ref 3.5–5.1)
SODIUM: 141 mmol/L (ref 136–145)

## 2014-01-13 LAB — MAGNESIUM: MAGNESIUM: 1.8 mg/dL

## 2014-01-13 LAB — HEPATIC FUNCTION PANEL A (ARMC)
ALBUMIN: 3.6 g/dL (ref 3.4–5.0)
AST: 15 U/L (ref 15–37)
Alkaline Phosphatase: 65 U/L
BILIRUBIN TOTAL: 0.4 mg/dL (ref 0.2–1.0)
Bilirubin, Direct: 0.1 mg/dL (ref 0.0–0.2)
SGPT (ALT): 22 U/L
Total Protein: 7.8 g/dL (ref 6.4–8.2)

## 2014-01-13 LAB — PHOSPHORUS: PHOSPHORUS: 3.1 mg/dL (ref 2.5–4.9)

## 2014-01-27 LAB — CBC CANCER CENTER
BASOS PCT: 0.5 %
Basophil #: 0 x10 3/mm (ref 0.0–0.1)
Eosinophil #: 0.2 x10 3/mm (ref 0.0–0.7)
Eosinophil %: 5.4 %
HCT: 38.1 % — ABNORMAL LOW (ref 40.0–52.0)
HGB: 12.4 g/dL — ABNORMAL LOW (ref 13.0–18.0)
LYMPHS PCT: 25.5 %
Lymphocyte #: 0.9 x10 3/mm — ABNORMAL LOW (ref 1.0–3.6)
MCH: 30.9 pg (ref 26.0–34.0)
MCHC: 32.5 g/dL (ref 32.0–36.0)
MCV: 95 fL (ref 80–100)
MONO ABS: 0.6 x10 3/mm (ref 0.2–1.0)
MONOS PCT: 17.4 %
NEUTROS ABS: 1.7 x10 3/mm (ref 1.4–6.5)
NEUTROS PCT: 51.2 %
PLATELETS: 193 x10 3/mm (ref 150–440)
RBC: 4.01 10*6/uL — ABNORMAL LOW (ref 4.40–5.90)
RDW: 15.2 % — AB (ref 11.5–14.5)
WBC: 3.4 x10 3/mm — ABNORMAL LOW (ref 3.8–10.6)

## 2014-01-27 LAB — CREATININE, SERUM
Creatinine: 1.46 mg/dL — ABNORMAL HIGH (ref 0.60–1.30)
EGFR (African American): >60
GFR CALC NON AF AMER: 51 — AB

## 2014-02-06 ENCOUNTER — Ambulatory Visit: Payer: Self-pay | Admitting: Internal Medicine

## 2014-02-10 LAB — CBC CANCER CENTER
BASOS ABS: 0 x10 3/mm (ref 0.0–0.1)
Basophil %: 1.5 %
EOS PCT: 4.1 %
Eosinophil #: 0.1 x10 3/mm (ref 0.0–0.7)
HCT: 37.1 % — ABNORMAL LOW (ref 40.0–52.0)
HGB: 12.3 g/dL — ABNORMAL LOW (ref 13.0–18.0)
LYMPHS PCT: 26.8 %
Lymphocyte #: 0.8 x10 3/mm — ABNORMAL LOW (ref 1.0–3.6)
MCH: 31.1 pg (ref 26.0–34.0)
MCHC: 33.1 g/dL (ref 32.0–36.0)
MCV: 94 fL (ref 80–100)
MONOS PCT: 16.9 %
Monocyte #: 0.5 x10 3/mm (ref 0.2–1.0)
Neutrophil #: 1.6 x10 3/mm (ref 1.4–6.5)
Neutrophil %: 50.7 %
PLATELETS: 259 x10 3/mm (ref 150–440)
RBC: 3.95 10*6/uL — AB (ref 4.40–5.90)
RDW: 15.7 % — AB (ref 11.5–14.5)
WBC: 3.1 x10 3/mm — ABNORMAL LOW (ref 3.8–10.6)

## 2014-02-10 LAB — PHOSPHORUS: Phosphorus: 2.6 mg/dL (ref 2.5–4.9)

## 2014-02-10 LAB — CREATININE, SERUM
Creatinine: 1.32 mg/dL — ABNORMAL HIGH (ref 0.60–1.30)
EGFR (African American): >60
EGFR (Non-African Amer.): 57 — ABNORMAL LOW

## 2014-02-10 LAB — MAGNESIUM: MAGNESIUM: 1.7 mg/dL — AB

## 2014-02-24 LAB — CBC CANCER CENTER
BASOS ABS: 0 x10 3/mm (ref 0.0–0.1)
Basophil %: 1.4 %
EOS ABS: 0.4 x10 3/mm (ref 0.0–0.7)
Eosinophil %: 12.7 %
HCT: 38.4 % — AB (ref 40.0–52.0)
HGB: 12.6 g/dL — ABNORMAL LOW (ref 13.0–18.0)
LYMPHS ABS: 0.8 x10 3/mm — AB (ref 1.0–3.6)
Lymphocyte %: 26.7 %
MCH: 30.9 pg (ref 26.0–34.0)
MCHC: 32.8 g/dL (ref 32.0–36.0)
MCV: 94 fL (ref 80–100)
MONOS PCT: 15.5 %
Monocyte #: 0.5 x10 3/mm (ref 0.2–1.0)
NEUTROS ABS: 1.4 x10 3/mm (ref 1.4–6.5)
Neutrophil %: 43.7 %
Platelet: 192 x10 3/mm (ref 150–440)
RBC: 4.07 10*6/uL — ABNORMAL LOW (ref 4.40–5.90)
RDW: 15.3 % — ABNORMAL HIGH (ref 11.5–14.5)
WBC: 3.2 x10 3/mm — ABNORMAL LOW (ref 3.8–10.6)

## 2014-02-26 LAB — PROT IMMUNOELECTROPHORES(ARMC)

## 2014-02-26 LAB — KAPPA/LAMBDA FREE LIGHT CHAINS (ARMC)

## 2014-03-09 ENCOUNTER — Ambulatory Visit: Payer: Self-pay | Admitting: Internal Medicine

## 2014-03-10 LAB — CBC CANCER CENTER
BASOS ABS: 0.1 x10 3/mm (ref 0.0–0.1)
Basophil %: 3 %
EOS ABS: 0.3 x10 3/mm (ref 0.0–0.7)
Eosinophil %: 10.1 %
HCT: 37.3 % — ABNORMAL LOW (ref 40.0–52.0)
HGB: 12.4 g/dL — ABNORMAL LOW (ref 13.0–18.0)
LYMPHS ABS: 0.8 x10 3/mm — AB (ref 1.0–3.6)
Lymphocyte %: 30.6 %
MCH: 31.5 pg (ref 26.0–34.0)
MCHC: 33.3 g/dL (ref 32.0–36.0)
MCV: 94 fL (ref 80–100)
MONO ABS: 0.6 x10 3/mm (ref 0.2–1.0)
MONOS PCT: 23.7 %
NEUTROS ABS: 0.9 x10 3/mm — AB (ref 1.4–6.5)
Neutrophil %: 32.6 %
PLATELETS: 213 x10 3/mm (ref 150–440)
RBC: 3.95 10*6/uL — AB (ref 4.40–5.90)
RDW: 16.5 % — ABNORMAL HIGH (ref 11.5–14.5)
WBC: 2.7 x10 3/mm — AB (ref 3.8–10.6)

## 2014-03-10 LAB — HEPATIC FUNCTION PANEL A (ARMC)
Albumin: 3.2 g/dL — ABNORMAL LOW (ref 3.4–5.0)
Alkaline Phosphatase: 56 U/L (ref 46–116)
Bilirubin, Direct: 0.1 mg/dL (ref 0.0–0.2)
Bilirubin,Total: 0.4 mg/dL (ref 0.2–1.0)
SGOT(AST): 22 U/L (ref 15–37)
SGPT (ALT): 21 U/L (ref 14–63)
Total Protein: 7.2 g/dL (ref 6.4–8.2)

## 2014-03-10 LAB — MAGNESIUM: MAGNESIUM: 1.8 mg/dL

## 2014-03-10 LAB — BASIC METABOLIC PANEL
Anion Gap: 7 (ref 7–16)
BUN: 18 mg/dL (ref 7–18)
CALCIUM: 8.5 mg/dL (ref 8.5–10.1)
CO2: 27 mmol/L (ref 21–32)
Chloride: 108 mmol/L — ABNORMAL HIGH (ref 98–107)
Creatinine: 1.34 mg/dL — ABNORMAL HIGH (ref 0.60–1.30)
EGFR (African American): >60
EGFR (Non-African Amer.): 56 — ABNORMAL LOW
Glucose: 120 mg/dL — ABNORMAL HIGH (ref 65–99)
OSMOLALITY: 286 (ref 275–301)
Potassium: 4.3 mmol/L (ref 3.5–5.1)
Sodium: 142 mmol/L (ref 136–145)

## 2014-03-10 LAB — PHOSPHORUS: Phosphorus: 2.6 mg/dL (ref 2.5–4.9)

## 2014-04-07 ENCOUNTER — Ambulatory Visit
Admit: 2014-04-07 | Disposition: A | Discharge status: Home / Self Care | Payer: Self-pay | Attending: Internal Medicine | Admitting: Internal Medicine

## 2014-05-01 LAB — CBC CANCER CENTER
Basophil #: 0 x10 3/mm (ref 0.0–0.1)
Basophil %: 0.8 %
EOS PCT: 8.5 %
Eosinophil #: 0.2 x10 3/mm (ref 0.0–0.7)
HCT: 36.2 % — AB (ref 40.0–52.0)
HGB: 12 g/dL — ABNORMAL LOW (ref 13.0–18.0)
LYMPHS ABS: 0.7 x10 3/mm — AB (ref 1.0–3.6)
Lymphocyte %: 30.5 %
MCH: 31.7 pg (ref 26.0–34.0)
MCHC: 33.1 g/dL (ref 32.0–36.0)
MCV: 96 fL (ref 80–100)
MONOS PCT: 11.6 %
Monocyte #: 0.3 x10 3/mm (ref 0.2–1.0)
NEUTROS ABS: 1.2 x10 3/mm — AB (ref 1.4–6.5)
Neutrophil %: 48.6 %
Platelet: 177 x10 3/mm (ref 150–440)
RBC: 3.79 10*6/uL — AB (ref 4.40–5.90)
RDW: 16.4 % — AB (ref 11.5–14.5)
WBC: 2.4 x10 3/mm — ABNORMAL LOW (ref 3.8–10.6)

## 2014-05-01 LAB — BASIC METABOLIC PANEL
Anion Gap: 5 — ABNORMAL LOW (ref 7–16)
BUN: 16 mg/dL
CALCIUM: 9.1 mg/dL
CREATININE: 1.4 mg/dL — AB
Chloride: 107 mmol/L
Co2: 27 mmol/L
EGFR (Non-African Amer.): 51 — ABNORMAL LOW
GFR CALC AF AMER: 59 — AB
GLUCOSE: 102 mg/dL — AB
Potassium: 4.4 mmol/L
SODIUM: 139 mmol/L

## 2014-05-01 LAB — HEPATIC FUNCTION PANEL A (ARMC)
Albumin: 3.6 g/dL
Alkaline Phosphatase: 47 U/L
BILIRUBIN TOTAL: 0.6 mg/dL
Bilirubin, Direct: 0.1 mg/dL
Indirect Bilirubin: 0.5
SGOT(AST): 17 U/L
SGPT (ALT): 13 U/L — ABNORMAL LOW
TOTAL PROTEIN: 7.5 g/dL

## 2014-05-01 LAB — MAGNESIUM: Magnesium: 2 mg/dL

## 2014-05-01 LAB — PHOSPHORUS: Phosphorus: 2.5 mg/dL

## 2014-05-08 ENCOUNTER — Ambulatory Visit
Admit: 2014-05-08 | Disposition: A | Discharge status: Home / Self Care | Payer: Self-pay | Attending: Internal Medicine | Admitting: Internal Medicine

## 2014-05-15 LAB — CBC CANCER CENTER
BASOS ABS: 0 x10 3/mm (ref 0.0–0.1)
Basophil %: 1.3 %
Eosinophil #: 0.2 x10 3/mm (ref 0.0–0.7)
Eosinophil %: 7.9 %
HCT: 36.6 % — AB (ref 40.0–52.0)
HGB: 12.1 g/dL — ABNORMAL LOW (ref 13.0–18.0)
LYMPHS PCT: 32.6 %
Lymphocyte #: 0.8 x10 3/mm — ABNORMAL LOW (ref 1.0–3.6)
MCH: 31.3 pg (ref 26.0–34.0)
MCHC: 33 g/dL (ref 32.0–36.0)
MCV: 95 fL (ref 80–100)
MONOS PCT: 26.5 %
Monocyte #: 0.6 x10 3/mm (ref 0.2–1.0)
NEUTROS ABS: 0.8 x10 3/mm — AB (ref 1.4–6.5)
Neutrophil %: 31.7 %
Platelet: 182 x10 3/mm (ref 150–440)
RBC: 3.86 10*6/uL — ABNORMAL LOW (ref 4.40–5.90)
RDW: 16.8 % — AB (ref 11.5–14.5)
WBC: 2.4 x10 3/mm — AB (ref 3.8–10.6)

## 2014-05-22 LAB — CBC CANCER CENTER
BASOS PCT: 0.9 %
Basophil #: 0 x10 3/mm (ref 0.0–0.1)
EOS PCT: 0.6 %
Eosinophil #: 0 x10 3/mm (ref 0.0–0.7)
HCT: 36.7 % — ABNORMAL LOW (ref 40.0–52.0)
HGB: 12 g/dL — ABNORMAL LOW (ref 13.0–18.0)
LYMPHS PCT: 21.7 %
Lymphocyte #: 0.6 x10 3/mm — ABNORMAL LOW (ref 1.0–3.6)
MCH: 31.1 pg (ref 26.0–34.0)
MCHC: 32.8 g/dL (ref 32.0–36.0)
MCV: 95 fL (ref 80–100)
MONO ABS: 0.6 x10 3/mm (ref 0.2–1.0)
Monocyte %: 22.8 %
Neutrophil #: 1.5 x10 3/mm (ref 1.4–6.5)
Neutrophil %: 54 %
PLATELETS: 158 x10 3/mm (ref 150–440)
RBC: 3.87 10*6/uL — AB (ref 4.40–5.90)
RDW: 16.3 % — ABNORMAL HIGH (ref 11.5–14.5)
WBC: 2.8 x10 3/mm — ABNORMAL LOW (ref 3.8–10.6)

## 2014-05-29 LAB — CBC CANCER CENTER
BASOS ABS: 0 x10 3/mm (ref 0.0–0.1)
BASOS PCT: 0.8 %
EOS PCT: 6.6 %
Eosinophil #: 0.2 x10 3/mm (ref 0.0–0.7)
HCT: 38.9 % — AB (ref 40.0–52.0)
HGB: 12.8 g/dL — AB (ref 13.0–18.0)
LYMPHS PCT: 25.2 %
Lymphocyte #: 0.8 x10 3/mm — ABNORMAL LOW (ref 1.0–3.6)
MCH: 31.1 pg (ref 26.0–34.0)
MCHC: 32.8 g/dL (ref 32.0–36.0)
MCV: 95 fL (ref 80–100)
Monocyte #: 0.3 x10 3/mm (ref 0.2–1.0)
Monocyte %: 8.7 %
Neutrophil #: 1.8 x10 3/mm (ref 1.4–6.5)
Neutrophil %: 58.7 %
Platelet: 194 x10 3/mm (ref 150–440)
RBC: 4.1 10*6/uL — ABNORMAL LOW (ref 4.40–5.90)
RDW: 16.3 % — AB (ref 11.5–14.5)
WBC: 3 x10 3/mm — ABNORMAL LOW (ref 3.8–10.6)

## 2014-05-29 LAB — MAGNESIUM: MAGNESIUM: 1.8 mg/dL

## 2014-05-29 LAB — PHOSPHORUS: Phosphorus: 3 mg/dL

## 2014-05-29 LAB — CREATININE, SERUM
Creatinine: 1.48 mg/dL — ABNORMAL HIGH
EGFR (Non-African Amer.): 47 — ABNORMAL LOW
GFR CALC AF AMER: 55 — AB

## 2014-06-10 DIAGNOSIS — E785 Hyperlipidemia, unspecified: Secondary | ICD-10-CM | POA: Insufficient documentation

## 2014-06-12 ENCOUNTER — Inpatient Hospital Stay: Payer: Medicare Other | Attending: Internal Medicine

## 2014-06-12 DIAGNOSIS — Z79899 Other long term (current) drug therapy: Secondary | ICD-10-CM | POA: Insufficient documentation

## 2014-06-12 DIAGNOSIS — C9000 Multiple myeloma not having achieved remission: Secondary | ICD-10-CM | POA: Diagnosis not present

## 2014-06-12 DIAGNOSIS — Z9221 Personal history of antineoplastic chemotherapy: Secondary | ICD-10-CM | POA: Insufficient documentation

## 2014-06-12 LAB — CBC WITH DIFFERENTIAL/PLATELET
Basophils Absolute: 0 10*3/uL (ref 0–0.1)
Basophils Relative: 1 %
EOS ABS: 0.2 10*3/uL (ref 0–0.7)
Eosinophils Relative: 7 %
HEMATOCRIT: 36.3 % — AB (ref 40.0–52.0)
Hemoglobin: 12 g/dL — ABNORMAL LOW (ref 13.0–18.0)
LYMPHS PCT: 34 %
Lymphs Abs: 0.8 10*3/uL — ABNORMAL LOW (ref 1.0–3.6)
MCH: 31.6 pg (ref 26.0–34.0)
MCHC: 33.2 g/dL (ref 32.0–36.0)
MCV: 95.3 fL (ref 80.0–100.0)
MONO ABS: 0.5 10*3/uL (ref 0.2–1.0)
Monocytes Relative: 21 %
Neutro Abs: 0.9 10*3/uL — ABNORMAL LOW (ref 1.4–6.5)
Neutrophils Relative %: 37 %
Platelets: 138 10*3/uL — ABNORMAL LOW (ref 150–440)
RBC: 3.81 MIL/uL — ABNORMAL LOW (ref 4.40–5.90)
RDW: 16.1 % — ABNORMAL HIGH (ref 11.5–14.5)
WBC: 2.5 10*3/uL — AB (ref 3.8–10.6)

## 2014-06-15 ENCOUNTER — Telehealth: Payer: Self-pay

## 2014-06-15 ENCOUNTER — Other Ambulatory Visit: Payer: Self-pay

## 2014-06-15 DIAGNOSIS — C9000 Multiple myeloma not having achieved remission: Secondary | ICD-10-CM

## 2014-06-15 NOTE — Telephone Encounter (Signed)
Called patient and informed him his [place] is 0.9 and per Dr. Ma Hillock he should his  hold Revlimid. Patient stated that last week was his week off and he was scheduled to start back on 06/14/14. I informed him to hold it and Dr. Ma Hillock would like to recheck a CBC with diff this Friday, May 13. I have entered CBC order and ask patient to call me back with a time that he would like to come this Friday.

## 2014-06-18 ENCOUNTER — Inpatient Hospital Stay: Payer: Medicare Other

## 2014-06-18 DIAGNOSIS — C9000 Multiple myeloma not having achieved remission: Secondary | ICD-10-CM

## 2014-06-18 LAB — CBC WITH DIFFERENTIAL/PLATELET
BASOS ABS: 0 10*3/uL (ref 0–0.1)
BASOS PCT: 1 %
Eosinophils Absolute: 0.1 10*3/uL (ref 0–0.7)
Eosinophils Relative: 5 %
HCT: 36.8 % — ABNORMAL LOW (ref 40.0–52.0)
Hemoglobin: 11.9 g/dL — ABNORMAL LOW (ref 13.0–18.0)
LYMPHS PCT: 38 %
Lymphs Abs: 1 10*3/uL (ref 1.0–3.6)
MCH: 30.8 pg (ref 26.0–34.0)
MCHC: 32.3 g/dL (ref 32.0–36.0)
MCV: 95.3 fL (ref 80.0–100.0)
Monocytes Absolute: 0.7 10*3/uL (ref 0.2–1.0)
Monocytes Relative: 27 %
Neutro Abs: 0.8 10*3/uL — ABNORMAL LOW (ref 1.4–6.5)
Neutrophils Relative %: 29 %
PLATELETS: 159 10*3/uL (ref 150–440)
RBC: 3.86 MIL/uL — ABNORMAL LOW (ref 4.40–5.90)
RDW: 16.6 % — ABNORMAL HIGH (ref 11.5–14.5)
WBC: 2.6 10*3/uL — AB (ref 3.8–10.6)

## 2014-06-18 IMAGING — CR METASTATIC BONE SURVEY
1 series · 8 of 8 positions shown · non-contrast
Comparison: none

REASON FOR EXAM: include skull myeloma for lytic
COMMENTS:

[Series 1: w chest pa · 0.14mm/px · 8 of 24 slices shown]
[im 1/24]
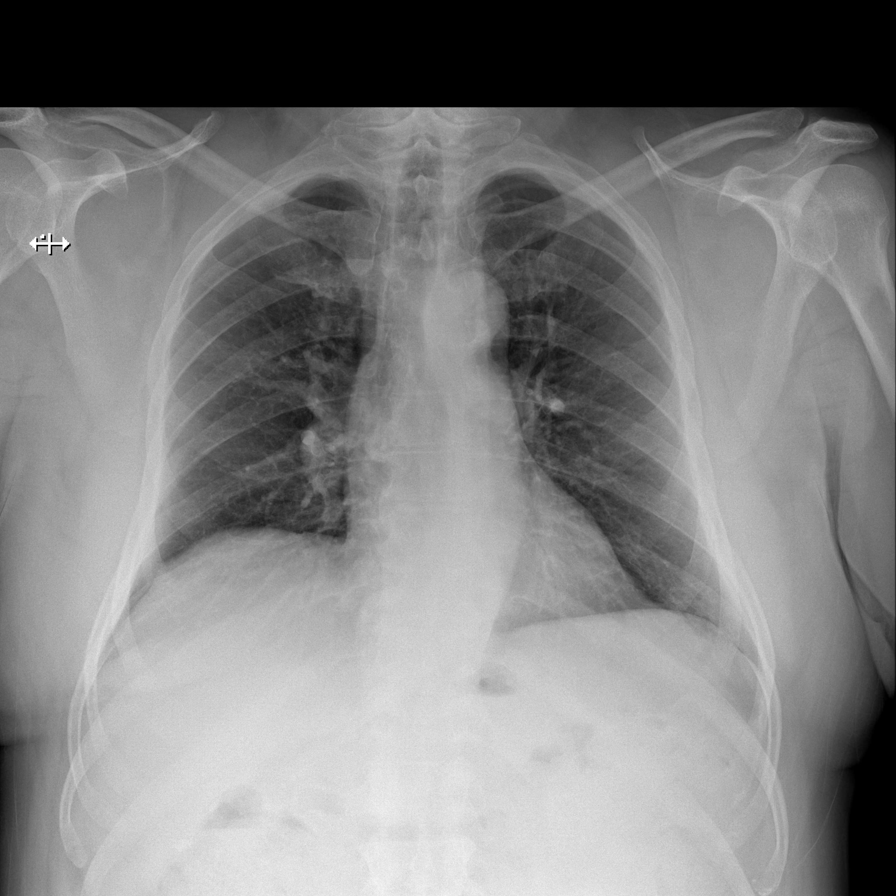
[im 4/24]
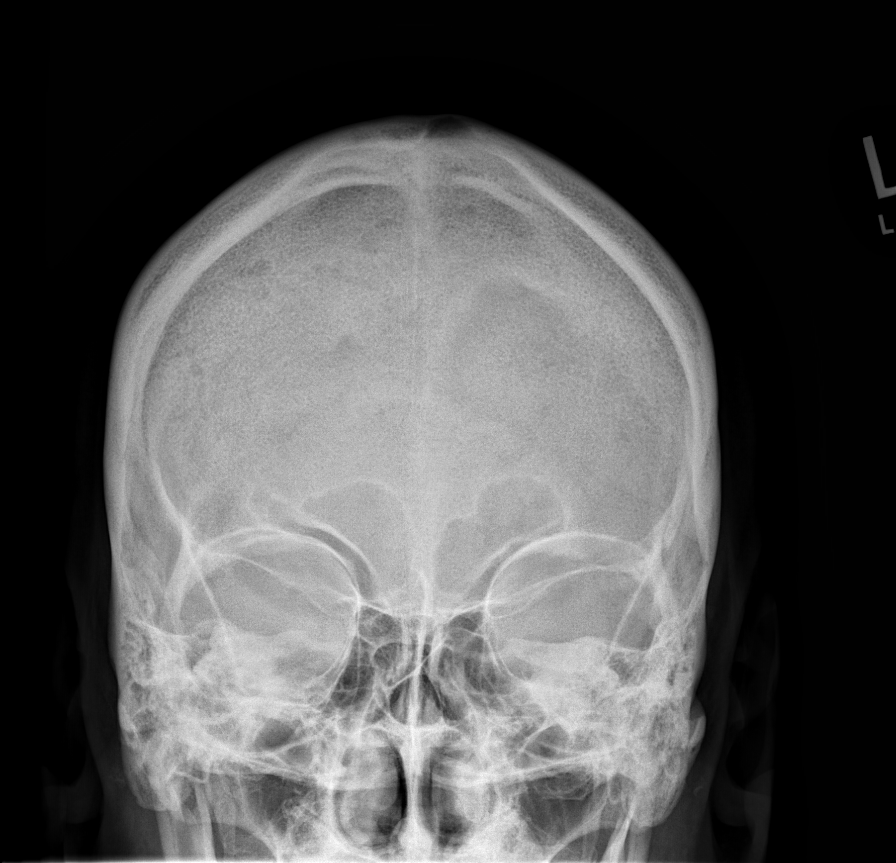
[im 7/24]
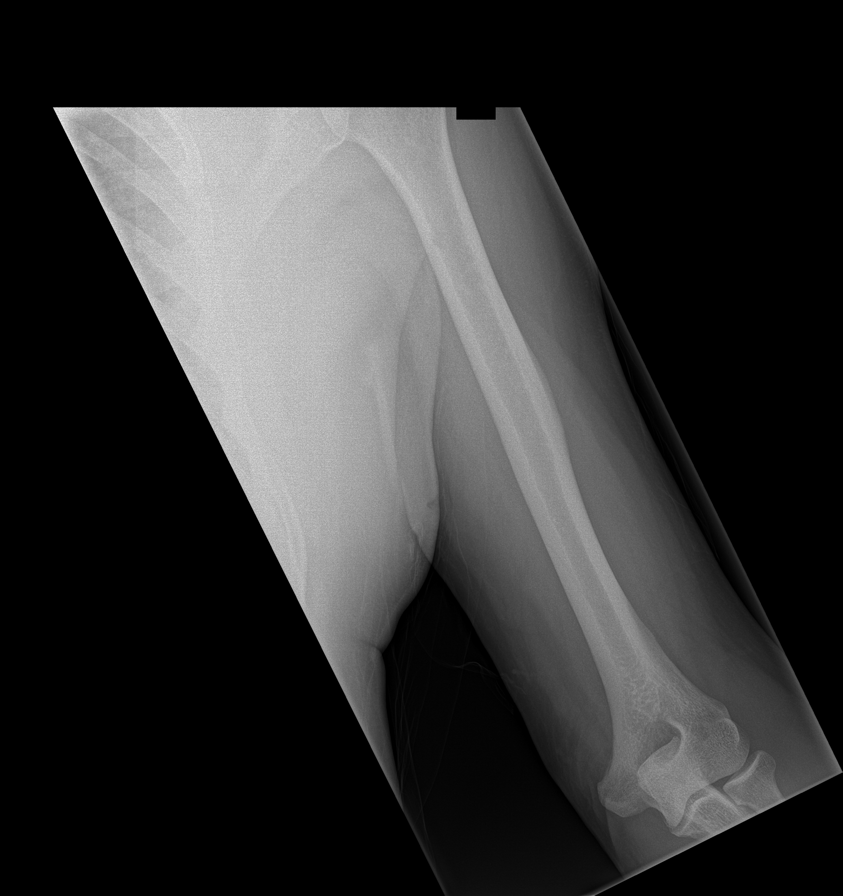
[im 10/24]
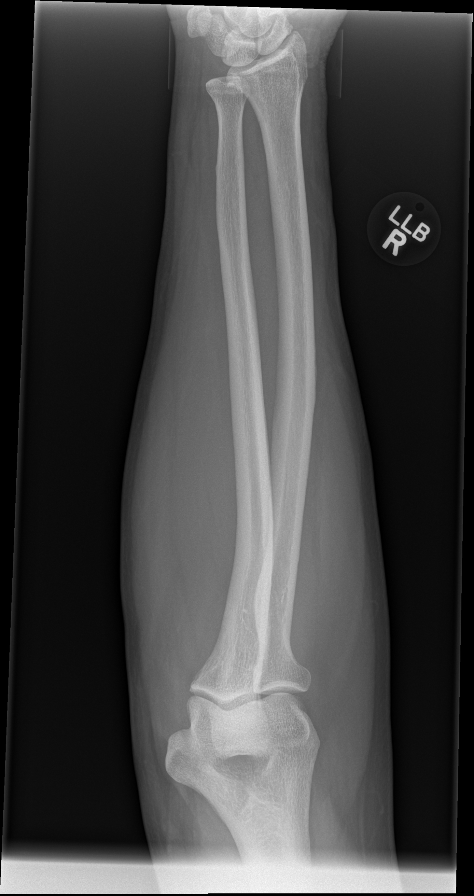
[im 14/24]
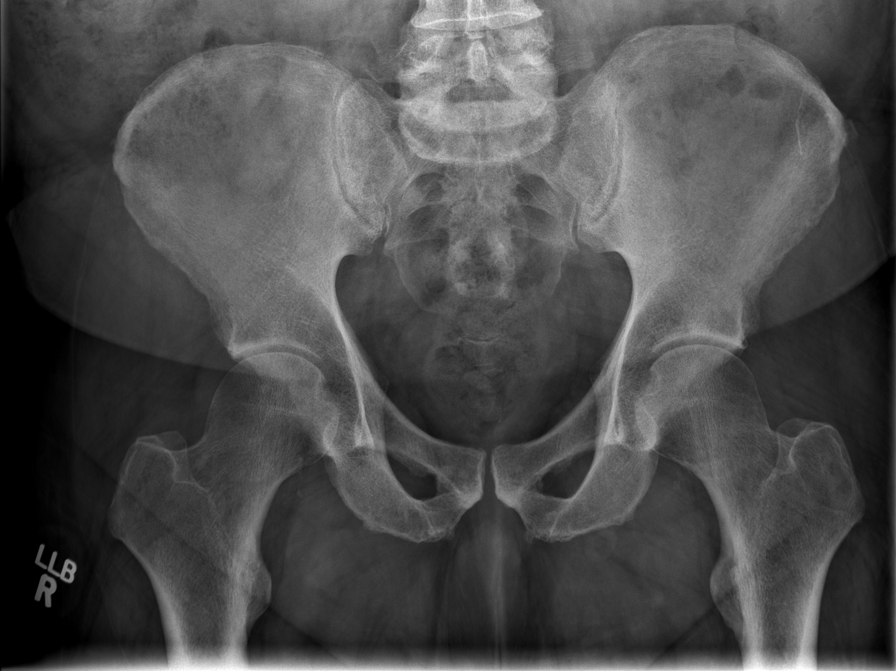
[im 17/24]
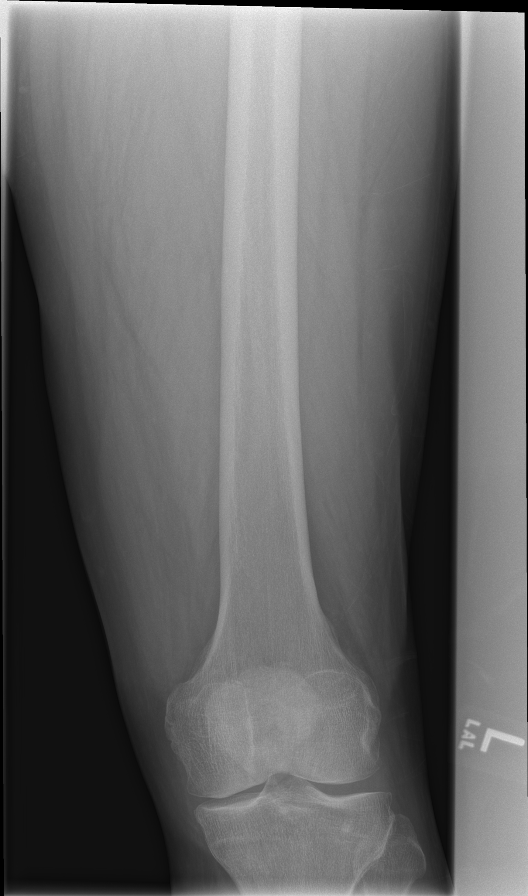
[im 20/24]
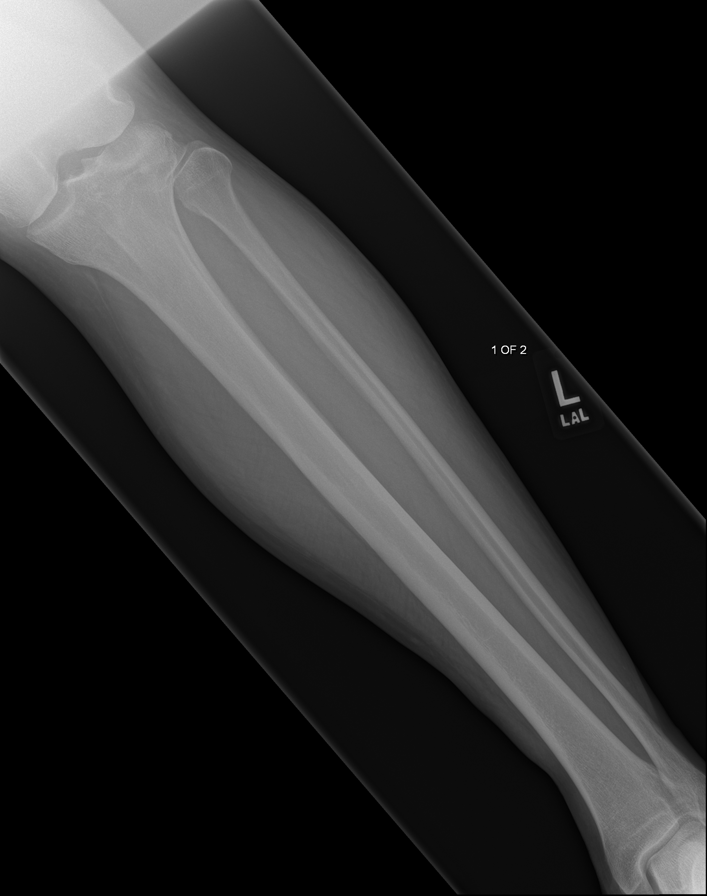
[im 24/24]
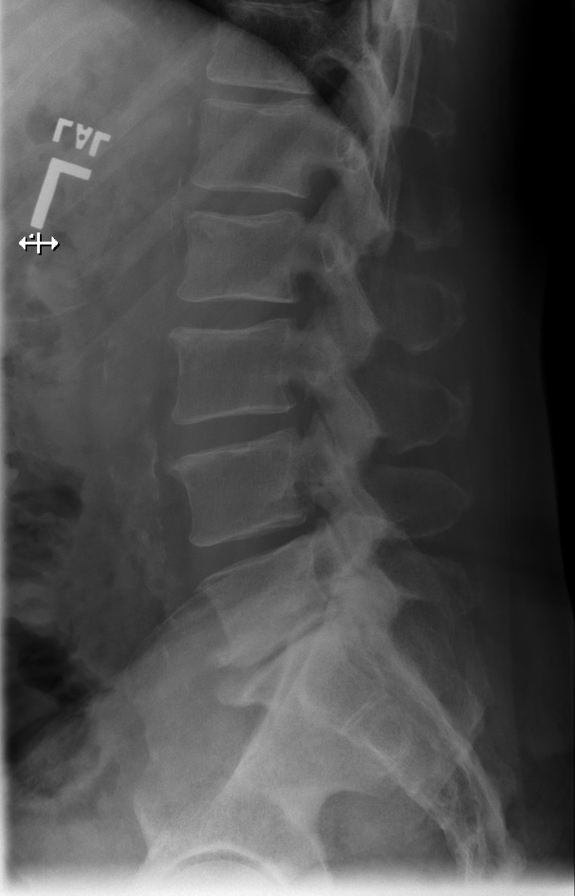

[8 of 8 positions shown; findings below may reference images not displayed]

PROCEDURE:     DXR - DXR BONE SURVEY METASTATIC  - June 03, 2012  [DATE]

RESULT:     A bone survey was performed in usual fashion.

The chest film reveals the lungs to be adequately inflated. The right
hemidiaphragm is higher than the left. The cardiac silhouette is normal in
size. There is mild tortuosity of the descending thoracic aorta. The
observed portions of the bony thorax appear normal.

AP and lateral views of the calvarium reveal subtle mineralization defects
which may reflect subcentimeter myelomatous deposits. The cervical,
thoracic, and lumbar vertebral bodies are preserved in height. No more than
mild degenerative changes are evident. No lytic nor blastic lesions are
demonstrated. The bones of the upper extremities reveal no lytic nor blastic
lesions. The bony pelvis appears adequately mineralized. There is mild
narrowing of the hip joints. No definite lytic lesions are demonstrated in
the right femur or right tibia or fibula. On the left in the greater
trochanter there is a subcentimeter lucency that may reflect a myelomatous
composite. The left tibia and fibula exhibit normal mineralization.
IMPRESSION: 1. There are subcentimeter lucencies within the calvarium especially on the
right. There is also a similar-appearing finding in the greater trochanter
of the left hip.
2. There are mild degenerative changes of the spine.
3. Mild narrowing of the hip joints is present bilaterally.
4. No acute cardiopulmonary abnormality is demonstrated.

[REDACTED]

## 2014-06-24 ENCOUNTER — Other Ambulatory Visit: Payer: Self-pay | Admitting: *Deleted

## 2014-06-24 DIAGNOSIS — C9000 Multiple myeloma not having achieved remission: Secondary | ICD-10-CM

## 2014-06-26 ENCOUNTER — Encounter (INDEPENDENT_AMBULATORY_CARE_PROVIDER_SITE_OTHER): Payer: Self-pay

## 2014-06-26 ENCOUNTER — Inpatient Hospital Stay: Payer: Medicare Other

## 2014-06-26 ENCOUNTER — Other Ambulatory Visit: Payer: Self-pay | Admitting: *Deleted

## 2014-06-26 VITALS — BP 158/73 | HR 53 | Temp 97.8°F | Resp 16

## 2014-06-26 DIAGNOSIS — C9000 Multiple myeloma not having achieved remission: Secondary | ICD-10-CM

## 2014-06-26 LAB — CBC
HEMATOCRIT: 38.2 % — AB (ref 40.0–52.0)
Hemoglobin: 12.3 g/dL — ABNORMAL LOW (ref 13.0–18.0)
MCH: 30.8 pg (ref 26.0–34.0)
MCHC: 32.2 g/dL (ref 32.0–36.0)
MCV: 95.6 fL (ref 80.0–100.0)
Platelets: 222 10*3/uL (ref 150–440)
RBC: 3.99 MIL/uL — AB (ref 4.40–5.90)
RDW: 16.8 % — ABNORMAL HIGH (ref 11.5–14.5)
WBC: 3.2 10*3/uL — ABNORMAL LOW (ref 3.8–10.6)

## 2014-06-26 LAB — MAGNESIUM: MAGNESIUM: 2 mg/dL (ref 1.7–2.4)

## 2014-06-26 LAB — CREATININE, SERUM
Creatinine, Ser: 1.32 mg/dL — ABNORMAL HIGH (ref 0.61–1.24)
GFR, EST NON AFRICAN AMERICAN: 53 mL/min — AB (ref >60)

## 2014-06-26 LAB — CALCIUM: Calcium: 9.4 mg/dL (ref 8.9–10.3)

## 2014-06-26 LAB — PHOSPHORUS: PHOSPHORUS: 3 mg/dL (ref 2.5–4.6)

## 2014-06-26 MED ORDER — ZOLEDRONIC ACID 4 MG/5ML IV CONC
3.3000 mg | Freq: Once | INTRAVENOUS | Status: AC
  Administered 2014-06-26: 3.3 mg via INTRAVENOUS
  Filled 2014-06-26: qty 4.13

## 2014-07-10 ENCOUNTER — Inpatient Hospital Stay: Payer: Medicare Other | Attending: Internal Medicine

## 2014-07-10 DIAGNOSIS — C9000 Multiple myeloma not having achieved remission: Secondary | ICD-10-CM | POA: Insufficient documentation

## 2014-07-10 LAB — CBC WITH DIFFERENTIAL/PLATELET
BASOS PCT: 1 %
Basophils Absolute: 0 10*3/uL (ref 0–0.1)
EOS PCT: 7 %
Eosinophils Absolute: 0.2 10*3/uL (ref 0–0.7)
HEMATOCRIT: 37.8 % — AB (ref 40.0–52.0)
Hemoglobin: 12.3 g/dL — ABNORMAL LOW (ref 13.0–18.0)
LYMPHS ABS: 1.1 10*3/uL (ref 1.0–3.6)
Lymphocytes Relative: 35 %
MCH: 31.3 pg (ref 26.0–34.0)
MCHC: 32.4 g/dL (ref 32.0–36.0)
MCV: 96.3 fL (ref 80.0–100.0)
Monocytes Absolute: 0.4 10*3/uL (ref 0.2–1.0)
Monocytes Relative: 12 %
NEUTROS ABS: 1.4 10*3/uL (ref 1.4–6.5)
Neutrophils Relative %: 45 %
PLATELETS: 167 10*3/uL (ref 150–440)
RBC: 3.93 MIL/uL — ABNORMAL LOW (ref 4.40–5.90)
RDW: 17 % — AB (ref 11.5–14.5)
WBC: 3.2 10*3/uL — ABNORMAL LOW (ref 3.8–10.6)

## 2014-07-10 NOTE — Progress Notes (Signed)
Called patient and informed him that is [place] is doing better at 1.4. I advised patient that he can start back on Revlimid at his same dosage as before.

## 2014-07-13 ENCOUNTER — Other Ambulatory Visit: Payer: Self-pay | Admitting: *Deleted

## 2014-07-13 NOTE — Telephone Encounter (Signed)
rx was sent Friday

## 2014-07-24 ENCOUNTER — Inpatient Hospital Stay: Payer: Medicare Other

## 2014-07-24 DIAGNOSIS — C9000 Multiple myeloma not having achieved remission: Secondary | ICD-10-CM

## 2014-07-24 LAB — CBC WITH DIFFERENTIAL/PLATELET
BASOS PCT: 0 %
Basophils Absolute: 0 10*3/uL (ref 0–0.1)
Eosinophils Absolute: 0.1 10*3/uL (ref 0–0.7)
Eosinophils Relative: 4 %
HEMATOCRIT: 36 % — AB (ref 40.0–52.0)
HEMOGLOBIN: 11.7 g/dL — AB (ref 13.0–18.0)
LYMPHS ABS: 0.9 10*3/uL — AB (ref 1.0–3.6)
Lymphocytes Relative: 29 %
MCH: 31.3 pg (ref 26.0–34.0)
MCHC: 32.5 g/dL (ref 32.0–36.0)
MCV: 96.4 fL (ref 80.0–100.0)
MONO ABS: 0.5 10*3/uL (ref 0.2–1.0)
MONOS PCT: 16 %
NEUTROS ABS: 1.6 10*3/uL (ref 1.4–6.5)
Neutrophils Relative %: 51 %
Platelets: 168 10*3/uL (ref 150–440)
RBC: 3.74 MIL/uL — AB (ref 4.40–5.90)
RDW: 16.7 % — ABNORMAL HIGH (ref 11.5–14.5)
WBC: 3.1 10*3/uL — ABNORMAL LOW (ref 3.8–10.6)

## 2014-08-04 ENCOUNTER — Other Ambulatory Visit: Payer: Self-pay

## 2014-08-04 ENCOUNTER — Inpatient Hospital Stay: Payer: Medicare Other

## 2014-08-04 DIAGNOSIS — C9000 Multiple myeloma not having achieved remission: Secondary | ICD-10-CM | POA: Diagnosis not present

## 2014-08-04 LAB — CBC WITH DIFFERENTIAL/PLATELET
Basophils Absolute: 0 10*3/uL (ref 0–0.1)
Basophils Relative: 1 %
Eosinophils Absolute: 0.1 10*3/uL (ref 0–0.7)
Eosinophils Relative: 4 %
HEMATOCRIT: 38.5 % — AB (ref 40.0–52.0)
HEMOGLOBIN: 12.6 g/dL — AB (ref 13.0–18.0)
Lymphocytes Relative: 34 %
Lymphs Abs: 0.9 10*3/uL — ABNORMAL LOW (ref 1.0–3.6)
MCH: 31.7 pg (ref 26.0–34.0)
MCHC: 32.6 g/dL (ref 32.0–36.0)
MCV: 97.2 fL (ref 80.0–100.0)
MONO ABS: 0.7 10*3/uL (ref 0.2–1.0)
MONOS PCT: 26 %
NEUTROS ABS: 0.9 10*3/uL — AB (ref 1.4–6.5)
Neutrophils Relative %: 35 %
Platelets: 184 10*3/uL (ref 150–440)
RBC: 3.96 MIL/uL — ABNORMAL LOW (ref 4.40–5.90)
RDW: 16.2 % — ABNORMAL HIGH (ref 11.5–14.5)
WBC: 2.6 10*3/uL — ABNORMAL LOW (ref 3.8–10.6)

## 2014-08-07 ENCOUNTER — Other Ambulatory Visit: Payer: Medicare Other

## 2014-08-11 ENCOUNTER — Other Ambulatory Visit: Payer: Self-pay

## 2014-08-11 ENCOUNTER — Inpatient Hospital Stay: Payer: Medicare Other | Attending: Internal Medicine

## 2014-08-11 DIAGNOSIS — I10 Essential (primary) hypertension: Secondary | ICD-10-CM | POA: Diagnosis not present

## 2014-08-11 DIAGNOSIS — K219 Gastro-esophageal reflux disease without esophagitis: Secondary | ICD-10-CM | POA: Insufficient documentation

## 2014-08-11 DIAGNOSIS — C9000 Multiple myeloma not having achieved remission: Secondary | ICD-10-CM | POA: Diagnosis not present

## 2014-08-11 DIAGNOSIS — R944 Abnormal results of kidney function studies: Secondary | ICD-10-CM | POA: Diagnosis not present

## 2014-08-11 DIAGNOSIS — Z7982 Long term (current) use of aspirin: Secondary | ICD-10-CM | POA: Diagnosis not present

## 2014-08-11 DIAGNOSIS — Z9221 Personal history of antineoplastic chemotherapy: Secondary | ICD-10-CM | POA: Insufficient documentation

## 2014-08-11 DIAGNOSIS — D649 Anemia, unspecified: Secondary | ICD-10-CM | POA: Diagnosis not present

## 2014-08-11 DIAGNOSIS — R5383 Other fatigue: Secondary | ICD-10-CM | POA: Insufficient documentation

## 2014-08-11 DIAGNOSIS — Z79899 Other long term (current) drug therapy: Secondary | ICD-10-CM | POA: Diagnosis not present

## 2014-08-11 DIAGNOSIS — E119 Type 2 diabetes mellitus without complications: Secondary | ICD-10-CM | POA: Insufficient documentation

## 2014-08-11 DIAGNOSIS — E079 Disorder of thyroid, unspecified: Secondary | ICD-10-CM | POA: Insufficient documentation

## 2014-08-11 DIAGNOSIS — E785 Hyperlipidemia, unspecified: Secondary | ICD-10-CM | POA: Insufficient documentation

## 2014-08-11 LAB — CBC WITH DIFFERENTIAL/PLATELET
BASOS ABS: 0 10*3/uL (ref 0–0.1)
Basophils Relative: 1 %
EOS ABS: 0.1 10*3/uL (ref 0–0.7)
EOS PCT: 5 %
HCT: 37.6 % — ABNORMAL LOW (ref 40.0–52.0)
Hemoglobin: 12.3 g/dL — ABNORMAL LOW (ref 13.0–18.0)
Lymphocytes Relative: 30 %
Lymphs Abs: 0.8 10*3/uL — ABNORMAL LOW (ref 1.0–3.6)
MCH: 31.5 pg (ref 26.0–34.0)
MCHC: 32.8 g/dL (ref 32.0–36.0)
MCV: 96.1 fL (ref 80.0–100.0)
MONO ABS: 0.4 10*3/uL (ref 0.2–1.0)
Monocytes Relative: 15 %
NEUTROS ABS: 1.3 10*3/uL — AB (ref 1.4–6.5)
NEUTROS PCT: 49 %
PLATELETS: 219 10*3/uL (ref 150–440)
RBC: 3.91 MIL/uL — ABNORMAL LOW (ref 4.40–5.90)
RDW: 16.6 % — AB (ref 11.5–14.5)
WBC: 2.6 10*3/uL — ABNORMAL LOW (ref 3.8–10.6)

## 2014-08-21 ENCOUNTER — Encounter: Payer: Self-pay | Admitting: Internal Medicine

## 2014-08-21 ENCOUNTER — Inpatient Hospital Stay: Payer: Medicare Other

## 2014-08-21 ENCOUNTER — Inpatient Hospital Stay (HOSPITAL_BASED_OUTPATIENT_CLINIC_OR_DEPARTMENT_OTHER): Payer: Medicare Other | Admitting: Internal Medicine

## 2014-08-21 VITALS — BP 102/65 | HR 58 | Temp 97.7°F | Resp 18 | Ht 71.0 in | Wt 233.7 lb

## 2014-08-21 DIAGNOSIS — E119 Type 2 diabetes mellitus without complications: Secondary | ICD-10-CM

## 2014-08-21 DIAGNOSIS — C9000 Multiple myeloma not having achieved remission: Secondary | ICD-10-CM | POA: Diagnosis not present

## 2014-08-21 DIAGNOSIS — E785 Hyperlipidemia, unspecified: Secondary | ICD-10-CM

## 2014-08-21 DIAGNOSIS — D649 Anemia, unspecified: Secondary | ICD-10-CM | POA: Diagnosis not present

## 2014-08-21 DIAGNOSIS — Z9221 Personal history of antineoplastic chemotherapy: Secondary | ICD-10-CM

## 2014-08-21 DIAGNOSIS — E079 Disorder of thyroid, unspecified: Secondary | ICD-10-CM

## 2014-08-21 DIAGNOSIS — K219 Gastro-esophageal reflux disease without esophagitis: Secondary | ICD-10-CM

## 2014-08-21 DIAGNOSIS — I10 Essential (primary) hypertension: Secondary | ICD-10-CM

## 2014-08-21 DIAGNOSIS — R5383 Other fatigue: Secondary | ICD-10-CM

## 2014-08-21 DIAGNOSIS — R944 Abnormal results of kidney function studies: Secondary | ICD-10-CM

## 2014-08-21 DIAGNOSIS — Z7982 Long term (current) use of aspirin: Secondary | ICD-10-CM

## 2014-08-21 DIAGNOSIS — Z79899 Other long term (current) drug therapy: Secondary | ICD-10-CM

## 2014-08-21 LAB — CBC WITH DIFFERENTIAL/PLATELET
Basophils Absolute: 0 10*3/uL (ref 0–0.1)
Basophils Relative: 1 %
Eosinophils Absolute: 0.3 10*3/uL (ref 0–0.7)
Eosinophils Relative: 8 %
HCT: 36.3 % — ABNORMAL LOW (ref 40.0–52.0)
Hemoglobin: 11.7 g/dL — ABNORMAL LOW (ref 13.0–18.0)
LYMPHS ABS: 0.7 10*3/uL — AB (ref 1.0–3.6)
LYMPHS PCT: 19 %
MCH: 31.3 pg (ref 26.0–34.0)
MCHC: 32.3 g/dL (ref 32.0–36.0)
MCV: 96.9 fL (ref 80.0–100.0)
MONO ABS: 0.2 10*3/uL (ref 0.2–1.0)
Monocytes Relative: 5 %
NEUTROS PCT: 67 %
Neutro Abs: 2.5 10*3/uL (ref 1.4–6.5)
Platelets: 183 10*3/uL (ref 150–440)
RBC: 3.74 MIL/uL — ABNORMAL LOW (ref 4.40–5.90)
RDW: 16.1 % — ABNORMAL HIGH (ref 11.5–14.5)
WBC: 3.7 10*3/uL — ABNORMAL LOW (ref 3.8–10.6)

## 2014-08-21 LAB — COMPREHENSIVE METABOLIC PANEL
ALK PHOS: 45 U/L (ref 38–126)
ALT: 15 U/L — AB (ref 17–63)
AST: 22 U/L (ref 15–41)
Albumin: 3.7 g/dL (ref 3.5–5.0)
Anion gap: 8 (ref 5–15)
BILIRUBIN TOTAL: 0.8 mg/dL (ref 0.3–1.2)
BUN: 27 mg/dL — AB (ref 6–20)
CO2: 24 mmol/L (ref 22–32)
Calcium: 8.6 mg/dL — ABNORMAL LOW (ref 8.9–10.3)
Chloride: 109 mmol/L (ref 101–111)
Creatinine, Ser: 1.82 mg/dL — ABNORMAL HIGH (ref 0.61–1.24)
GFR calc Af Amer: 42 mL/min — ABNORMAL LOW (ref >60)
GFR calc non Af Amer: 36 mL/min — ABNORMAL LOW (ref >60)
Glucose, Bld: 124 mg/dL — ABNORMAL HIGH (ref 65–99)
Potassium: 4.2 mmol/L (ref 3.5–5.1)
Sodium: 141 mmol/L (ref 135–145)
Total Protein: 7.2 g/dL (ref 6.5–8.1)

## 2014-08-21 LAB — MAGNESIUM: Magnesium: 1.7 mg/dL (ref 1.7–2.4)

## 2014-08-21 LAB — PHOSPHORUS: PHOSPHORUS: 2.8 mg/dL (ref 2.5–4.6)

## 2014-08-21 NOTE — Progress Notes (Signed)
Pt her for f/u and to get zometa. No c/o, no pain, eating well

## 2014-08-23 LAB — KAPPA/LAMBDA LIGHT CHAINS
Kappa free light chain: 77.94 mg/L — ABNORMAL HIGH (ref 3.30–19.40)
Kappa, lambda light chain ratio: 1.86 — ABNORMAL HIGH (ref 0.26–1.65)
Lambda free light chains: 41.98 mg/L — ABNORMAL HIGH (ref 5.71–26.30)

## 2014-08-24 LAB — PROTEIN ELECTROPHORESIS, SERUM
A/G Ratio: 0.9 (ref 0.7–1.7)
Albumin ELP: 3.2 g/dL (ref 2.9–4.4)
Alpha-1-Globulin: 0.2 g/dL (ref 0.0–0.4)
Alpha-2-Globulin: 0.6 g/dL (ref 0.4–1.0)
Beta Globulin: 1.1 g/dL (ref 0.7–1.3)
GAMMA GLOBULIN: 1.5 g/dL (ref 0.4–1.8)
Globulin, Total: 3.5 g/dL (ref 2.2–3.9)
Total Protein ELP: 6.7 g/dL (ref 6.0–8.5)

## 2014-08-26 NOTE — Progress Notes (Signed)
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  Flow cytometry shows monoclonal plasma cell population, 9% of sample.  Cytogenetics normal, 43 XY.  FISH panel shows Aneuploid population ( gain of chromosomes 9, 15 and [id number]).   (presented with monoclonal paraproteinemia with M-spike of 2.2 g/dL, IgG monoclonal protein with kappa light chains specificity, along with elevated serum IgG of 3400 mg/dL on SIEP done on 04/24/12. Serum IgM low at 29; IgA normal at 173.  Also mild anemia and abnormal creatinine of 1.8).  Started Velcade SQ/Revlimid/Decadron on 06/03/12 (got [id number] on 10/29/12).   Repeat bone marrow biopsy 10/29/12 - mild marrow plasmacytosis (~5%)  with no definitive clonality detected, normocellular to mildly hypercellular marrow for age 40-40% with trilineage hematopoiesis, storage iron detected.  Flow cytometry reports no definitive plasma cell clonality detected. Cytogenetics 46,XY.  Then continued on maintenance with Revlimid/Decadron/Zometa. Repeat SIEP on 02/25/13 showed M-spike is resolved/nonquantifiable. Serum kappa is 29.53, lambda is 24.28, kappa/lambda ratio doing well at 1.22.  Decadron stopped March 2015 due to concern of side effects. Continues on single agent maintenance treatment with Revlimid, and gets Zometa.  HISTORY OF PRESENT ILLNESS:  Patient here for continued oncology follow-up for myeloma as described above, and plan next dose of Zometa infusion. He takes oral Revlimid, states that he is tolerating this well without any side effects. States that he is doing about the same,  mild fatigue on exertion is unchanged, he continues to be physically active. Denies any recurrent muscle pain or back pain. Denies any leg weakness or other neurological symptoms. Appetite is good. No fevers or chills. No new bone pains. States that blood sugars are under fairly good control at home when he checks them. Denies history of thromboembolic phenomenon. No new bone pains, 0/10.   REVIEW OF SYSTEMS:   ROS As in HPI above. In addition, no fever, chills or sweats. No new headaches or focal weakness.  No new mood disturbances. No  sore throat, cough, shortness of breath, sputum, hemoptysis or chest pain. No dizziness or palpitation. No abdominal pain, constipation, diarrhea, dysuria or hematuria. No new skin rash or bleeding symptoms. No new paresthesias in extremities. PS ECOG 1.  PAST MEDICAL HISTORY: Reviewed. Past Medical History  Diagnosis Date  . Multiple myeloma   . Diabetes mellitus without complication   . Hypertension   . Thyroid disease   . GERD (gastroesophageal reflux disease)   . Hyperlipidemia   . Obesity           Hypertension  Diabetes mellitus diagnosed 2006  Hypothyroidism, gives history of thyroid cancer many years ago states that he had surgery and radiation, unsure of further details.  Hyperlipidemia  Obesity  GERD  Neutropenia  Mole extraction  PAST SURGICAL HISTORY: Reviewed. As above  FAMILY HISTORY: Reviewed. Noncontributory, denies malignancy or hematological disorders.  SOCIAL HISTORY: Reviewed. History  Substance Use Topics  . Smoking status: Never Smoker   . Smokeless tobacco: Never Used  . Alcohol Use: No    No Known Allergies  Current Outpatient Prescriptions  Medication Sig Dispense Refill  . acyclovir (ZOVIRAX) 400 MG tablet Take 400 mg by  mouth daily.    [name] amLODipine (NORVASC) 10 MG tablet Take 10 mg by mouth daily.    [name] atorvastatin (LIPITOR) 20 MG tablet Take 20 mg by mouth daily.    . cloNIDine (CATAPRES) 0.1 MG tablet Take  0.1 mg by mouth 2 (two) times daily.    [name] lenalidomide (REVLIMID) 15 MG capsule Take 15 mg by mouth daily. Daily for 3 weeks and then 1 week off    . levothyroxine (SYNTHROID) 200 MCG tablet Take 1 tablet by mouth daily.    [name] losartan (COZAAR) 100 MG tablet Take 100 mg by mouth daily.    . pioglitazone (ACTOS) 30 MG tablet Take 30 mg by mouth daily.    . quinapril (ACCUPRIL) 40 MG tablet Take 40 mg by mouth daily.    [name] aspirin EC 81 MG tablet Take 81 mg by mouth daily.    . sennosides-docusate sodium (SENOKOT-S) 8.6-50 MG tablet Take 2 tablets by mouth daily.     No current facility-administered medications for this visit.    PHYSICAL EXAM: Filed Vitals:   08/21/14 1358  BP: 102/65  Pulse: 58  Temp: 97.7 F (36.5 C)  Resp: 18     Body mass index is 32.61 kg/(m^2).    ECOG FS:1 - Symptomatic but completely ambulatory  GENERAL: Patient is alert and oriented and in no acute distress. There is no icterus. HEENT: EOMs intact. Oral exam negative for thrush or lesions. No cervical lymphadenopathy. CVS: S1S2, regular LUNGS: Bilaterally clear to auscultation, no rhonchi. ABDOMEN: Soft, nontender. No hepatosplenomegaly clinically.  NEURO: grossly nonfocal, cranial nerves are intact. Gait unremarkable. EXTREMITIES: No pedal edema.   LAB RESULTS:    Component Value Date/Time   NA 141 08/21/2014 1336   NA 139 05/01/2014 1322   K 4.2 08/21/2014 1336   K 4.4 05/01/2014 1322   CL 109 08/21/2014 1336   CL 107 05/01/2014 1322   CO2 24 08/21/2014 1336   CO2 27 05/01/2014 1322   GLUCOSE 124* 08/21/2014 1336   GLUCOSE 102* 05/01/2014 1322   BUN 27* 08/21/2014 1336   BUN 16 05/01/2014 1322   CREATININE 1.82* 08/21/2014 1336   CREATININE 1.48* 05/29/2014 0949   CALCIUM 8.6* 08/21/2014 1336   CALCIUM 9.1 05/01/2014 1322   PROT 7.2 08/21/2014 1336   PROT 7.5 05/01/2014 1322   ALBUMIN 3.7 08/21/2014 1336   ALBUMIN 3.6 05/01/2014 1322   AST 22 08/21/2014 1336   AST 17 05/01/2014 1322    ALT 15* 08/21/2014 1336   ALT 13* 05/01/2014 1322   ALKPHOS 45 08/21/2014 1336   ALKPHOS 47 05/01/2014 1322   BILITOT 0.8 08/21/2014 1336   BILITOT 0.6 05/01/2014 1322   GFRNONAA 36* 08/21/2014 1336   GFRNONAA 47* 05/29/2014 0949   GFRAA 42* 08/21/2014 1336   GFRAA 55* 05/29/2014 0949   Lab Results  Component Value Date   WBC 3.7* 08/21/2014   NEUTROABS 2.5 08/21/2014   HGB 11.7* 08/21/2014   HCT 36.3* 08/21/2014   MCV 96.9 08/21/2014   PLT 183 08/21/2014     STUDIES: 11/18/13 - SIEP continues to be negative for M-spike. serum kappa light chains 60.05 (was 29.53 in Jan 2015), serum lambda light chain 33.62 (was 24.28 in Jan 2015), kappa/lambda ratio 1.79 (was 1.22 in Jan 2015)  02/25/13 -  SIEP negative for M-spike. Serum kappa is 29.53, lambda is 24.28, kappa/lambda ratio is 1.22.  04/24/12 - SIEP with M-spike of 2.2 g/dL, IgG monoclonal protein with kappa light chains  specificity, along with elevated serum IgG of 3400 mg/dL.  07/30/13 - MRI T-spine. IMPRESSION: 1. Unusual constellation of abnormalities centered around T3-T4. Thoracic cord expansile intramedullary lesion dorsal to T3 and T4. 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 Cystic neoplasm is in the differential considerations however the lack of enhancement makes this unlikely. Noncontrast CT thoracic spine is recommended for further assessment. 2. Cervical spine MRI with and without contrast recommended for assessment of the craniocervical junction to exclude a Chiari malformation which can be associated with cord syrinx. 3. 13 mm dorsal T3 vertebral body lesion with peripheral enhancement. In a patient with multiple myeloma, this is favored to represent a small plasmacytoma. Again, unenhanced CT would be useful for  characterization. 4. Mild to moderate multilevel thoracic degenerative disease.  02/24/14 -  SIEP negative for M-spike. Free Kappa Lt Chains  88.45 mg/L  (3.30-19.40) Free Lambda Lt Chains 44.80 mg/L  (5.71-26.30) Kappa/Lambda Ratio     1.97.   04/03/14 -  Free Kappa Lt Chains  85.27 mg/L  (3.30-19.40) Free Lambda Lt Chains 44.31 mg/L  (5.71-26.30) Kappa/Lambda Ratio     1.92.  08/21/14 - SPEP negative for M spike. Free Kappa Lt Chains  77.94 Free Lambda Lt Chains 41.98 Kappa/Lambda Ratio   1.86.   ASSESSMENT / PLAN:   1. Multiple Myeloma, bone marrow biopsy on April 8 shows plasma cell population of ~ 40-50%   (presented initially with SIEP showing M-spike of 2.2 g/dL IgG monoclonal protein with kappa light chains specificity, elevated serum IgG of 3400 mg/dL on serum immunoelectrophoresis (SIEP) done on 04/24/12. Serum IgM low at 29; IgA normal at 173, mild anemia, abnormal creatinine of 1.8). Has completed 6 cycles of Velcade SQ/Revlimid/Decadron on 10/29/12, repeat bone marrow biopsy in September showed excellent response. Now continues on  Revlimid/Zometa. Decadron stopped given diabetes. Performance status is excellent, ECOG 0 - have reviewed labs from today and recent myeloma indices and d/w patient in detail. Serum kappa and lambda light chains and kappa/lambda ratio are steady and unchanged, ratio is 1.86. SPEP negative for M spike. He does not have any new bone pains or other symptoms referable to myeloma. Given this, plan is to continue on current single-agent Revlimid 25 mg daily as scheduled and monitor for response. Will get labs done once every 2 weeks, will repeat kappa/lambda ratio and SIEP at 14 weeks from now. Will see him back at 16 weeks to make continued treatment planning. 2. Bisphosphonate therapy for myeloma -  will hold Zometa at this time given increased creatinine, if it improves in the future then will resume next time onwards (he had been getting Zometa at dose of 3 mg  IV q 8 weeks).  3. Pain - no acute issues. 4. Anemia - Hemoglobin just below normal range, no progressive anemia symptoms. Continue to monitor. No role for ESA therapy like Procrit at this time. 5. 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 Have advised him to increase oral water intake and will continue to monitor. In between visits, patient advised to call or come to ER in case of any new symptoms or acute sickness.     Leia Alf, MD   08/26/2014 6:30 PM

## 2014-09-04 ENCOUNTER — Telehealth: Payer: Self-pay | Admitting: *Deleted

## 2014-09-04 ENCOUNTER — Inpatient Hospital Stay: Payer: Medicare Other

## 2014-09-04 DIAGNOSIS — C9000 Multiple myeloma not having achieved remission: Secondary | ICD-10-CM | POA: Diagnosis not present

## 2014-09-04 LAB — CREATININE, SERUM
Creatinine, Ser: 1.72 mg/dL — ABNORMAL HIGH (ref 0.61–1.24)
GFR calc non Af Amer: 39 mL/min — ABNORMAL LOW (ref >60)
GFR, EST AFRICAN AMERICAN: 45 mL/min — AB (ref >60)

## 2014-09-04 LAB — CBC WITH DIFFERENTIAL/PLATELET
BASOS ABS: 0 10*3/uL (ref 0–0.1)
BASOS PCT: 1 %
EOS ABS: 0.1 10*3/uL (ref 0–0.7)
Eosinophils Relative: 6 %
HEMATOCRIT: 39.5 % — AB (ref 40.0–52.0)
HEMOGLOBIN: 12.9 g/dL — AB (ref 13.0–18.0)
LYMPHS ABS: 0.9 10*3/uL — AB (ref 1.0–3.6)
Lymphocytes Relative: 37 %
MCH: 32 pg (ref 26.0–34.0)
MCHC: 32.8 g/dL (ref 32.0–36.0)
MCV: 97.6 fL (ref 80.0–100.0)
Monocytes Absolute: 0.4 10*3/uL (ref 0.2–1.0)
Monocytes Relative: 18 %
NEUTROS ABS: 1 10*3/uL — AB (ref 1.4–6.5)
NEUTROS PCT: 38 %
PLATELETS: 176 10*3/uL (ref 150–440)
RBC: 4.05 MIL/uL — ABNORMAL LOW (ref 4.40–5.90)
RDW: 16.2 % — AB (ref 11.5–14.5)
WBC: 2.5 10*3/uL — ABNORMAL LOW (ref 3.8–10.6)

## 2014-09-04 LAB — MAGNESIUM: Magnesium: 1.7 mg/dL (ref 1.7–2.4)

## 2014-09-04 LAB — PHOSPHORUS: Phosphorus: 3.5 mg/dL (ref 2.5–4.6)

## 2014-09-04 NOTE — Telephone Encounter (Signed)
Called pt and let him know that creat did improve very little from last time to this time and he really needs to push fluids.  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

## 2014-09-18 ENCOUNTER — Inpatient Hospital Stay: Payer: Medicare Other | Attending: Internal Medicine

## 2014-09-18 DIAGNOSIS — Z9221 Personal history of antineoplastic chemotherapy: Secondary | ICD-10-CM | POA: Diagnosis not present

## 2014-09-18 DIAGNOSIS — C9000 Multiple myeloma not having achieved remission: Secondary | ICD-10-CM | POA: Insufficient documentation

## 2014-09-18 LAB — CBC WITH DIFFERENTIAL/PLATELET
BASOS ABS: 0 10*3/uL (ref 0–0.1)
Basophils Relative: 1 %
EOS PCT: 6 %
Eosinophils Absolute: 0.2 10*3/uL (ref 0–0.7)
HCT: 36.8 % — ABNORMAL LOW (ref 40.0–52.0)
HEMOGLOBIN: 12.2 g/dL — AB (ref 13.0–18.0)
LYMPHS ABS: 0.6 10*3/uL — AB (ref 1.0–3.6)
Lymphocytes Relative: 23 %
MCH: 32 pg (ref 26.0–34.0)
MCHC: 33.1 g/dL (ref 32.0–36.0)
MCV: 96.7 fL (ref 80.0–100.0)
MONO ABS: 0.2 10*3/uL (ref 0.2–1.0)
MONOS PCT: 9 %
NEUTROS PCT: 61 %
Neutro Abs: 1.6 10*3/uL (ref 1.4–6.5)
Platelets: 194 10*3/uL (ref 150–440)
RBC: 3.81 MIL/uL — AB (ref 4.40–5.90)
RDW: 15.7 % — ABNORMAL HIGH (ref 11.5–14.5)
WBC: 2.6 10*3/uL — ABNORMAL LOW (ref 3.8–10.6)

## 2014-09-18 LAB — PHOSPHORUS: Phosphorus: 3.2 mg/dL (ref 2.5–4.6)

## 2014-09-18 LAB — CREATININE, SERUM
Creatinine, Ser: 1.49 mg/dL — ABNORMAL HIGH (ref 0.61–1.24)
GFR calc Af Amer: 53 mL/min — ABNORMAL LOW (ref >60)
GFR calc non Af Amer: 46 mL/min — ABNORMAL LOW (ref >60)

## 2014-09-18 LAB — MAGNESIUM: MAGNESIUM: 1.9 mg/dL (ref 1.7–2.4)

## 2014-09-20 DIAGNOSIS — E119 Type 2 diabetes mellitus without complications: Secondary | ICD-10-CM | POA: Insufficient documentation

## 2014-10-02 ENCOUNTER — Other Ambulatory Visit: Payer: Self-pay | Admitting: *Deleted

## 2014-10-02 ENCOUNTER — Inpatient Hospital Stay: Payer: Medicare Other

## 2014-10-02 DIAGNOSIS — C9000 Multiple myeloma not having achieved remission: Secondary | ICD-10-CM

## 2014-10-02 LAB — CBC WITH DIFFERENTIAL/PLATELET
BASOS ABS: 0 10*3/uL (ref 0–0.1)
BASOS PCT: 1 %
EOS ABS: 0.3 10*3/uL (ref 0–0.7)
Eosinophils Relative: 14 %
HCT: 35.6 % — ABNORMAL LOW (ref 40.0–52.0)
HEMOGLOBIN: 11.9 g/dL — AB (ref 13.0–18.0)
Lymphocytes Relative: 32 %
Lymphs Abs: 0.7 10*3/uL — ABNORMAL LOW (ref 1.0–3.6)
MCH: 32.2 pg (ref 26.0–34.0)
MCHC: 33.5 g/dL (ref 32.0–36.0)
MCV: 95.9 fL (ref 80.0–100.0)
Monocytes Absolute: 0.4 10*3/uL (ref 0.2–1.0)
Monocytes Relative: 17 %
NEUTROS ABS: 0.8 10*3/uL — AB (ref 1.4–6.5)
NEUTROS PCT: 36 %
Platelets: 115 10*3/uL — ABNORMAL LOW (ref 150–440)
RBC: 3.72 MIL/uL — ABNORMAL LOW (ref 4.40–5.90)
RDW: 15.3 % — ABNORMAL HIGH (ref 11.5–14.5)
WBC: 2.3 10*3/uL — AB (ref 3.8–10.6)

## 2014-10-02 NOTE — Progress Notes (Signed)
Called pt and left him a message that anc is low and needs to hold revlimid and repeat cbc next Friday and I will enter it for same time he usually comes.  If any problems please call me back.

## 2014-10-09 ENCOUNTER — Telehealth: Payer: Self-pay | Admitting: *Deleted

## 2014-10-09 ENCOUNTER — Inpatient Hospital Stay: Payer: Medicare Other | Attending: Internal Medicine

## 2014-10-09 DIAGNOSIS — C9000 Multiple myeloma not having achieved remission: Secondary | ICD-10-CM | POA: Diagnosis not present

## 2014-10-09 DIAGNOSIS — Z9221 Personal history of antineoplastic chemotherapy: Secondary | ICD-10-CM | POA: Insufficient documentation

## 2014-10-09 LAB — CBC WITH DIFFERENTIAL/PLATELET
BASOS PCT: 1 %
Basophils Absolute: 0 10*3/uL (ref 0–0.1)
EOS ABS: 0.1 10*3/uL (ref 0–0.7)
Eosinophils Relative: 4 %
HCT: 37.7 % — ABNORMAL LOW (ref 40.0–52.0)
Hemoglobin: 12.5 g/dL — ABNORMAL LOW (ref 13.0–18.0)
Lymphocytes Relative: 39 %
Lymphs Abs: 0.9 10*3/uL — ABNORMAL LOW (ref 1.0–3.6)
MCH: 31.8 pg (ref 26.0–34.0)
MCHC: 33 g/dL (ref 32.0–36.0)
MCV: 96.1 fL (ref 80.0–100.0)
MONO ABS: 0.3 10*3/uL (ref 0.2–1.0)
Monocytes Relative: 16 %
NEUTROS PCT: 40 %
Neutro Abs: 0.9 10*3/uL — ABNORMAL LOW (ref 1.4–6.5)
Platelets: 148 10*3/uL — ABNORMAL LOW (ref 150–440)
RBC: 3.93 MIL/uL — ABNORMAL LOW (ref 4.40–5.90)
RDW: 15.3 % — AB (ref 11.5–14.5)
WBC: 2.2 10*3/uL — ABNORMAL LOW (ref 3.8–10.6)

## 2014-10-09 LAB — CREATININE, SERUM
CREATININE: 1.46 mg/dL — AB (ref 0.61–1.24)
GFR calc Af Amer: 54 mL/min — ABNORMAL LOW (ref >60)
GFR, EST NON AFRICAN AMERICAN: 47 mL/min — AB (ref >60)

## 2014-10-09 LAB — PHOSPHORUS: PHOSPHORUS: 3.3 mg/dL (ref 2.5–4.6)

## 2014-10-09 LAB — MAGNESIUM: Magnesium: 1.7 mg/dL (ref 1.7–2.4)

## 2014-10-09 NOTE — Telephone Encounter (Signed)
Becky with [facility] called to ask if patient's Revlimid is on hold or discontinued?  Please return call so they can update their records.

## 2014-10-09 NOTE — Telephone Encounter (Signed)
Called and spoke to [place] at [facility] and informed her that Revlimid is on hold due to patient's ANC count being low. I informed her that patient will have CBC rechecked next week and if ANC has rebounded then we will restart treatment.

## 2014-10-16 ENCOUNTER — Inpatient Hospital Stay: Payer: Medicare Other

## 2014-10-16 ENCOUNTER — Other Ambulatory Visit: Payer: Self-pay | Admitting: Internal Medicine

## 2014-10-16 VITALS — BP 139/76 | HR 61 | Temp 96.5°F | Resp 18

## 2014-10-16 DIAGNOSIS — C9000 Multiple myeloma not having achieved remission: Secondary | ICD-10-CM

## 2014-10-16 LAB — CREATININE, SERUM
CREATININE: 1.34 mg/dL — AB (ref 0.61–1.24)
GFR calc Af Amer: >60 mL/min (ref >60)
GFR calc non Af Amer: 52 mL/min — ABNORMAL LOW (ref >60)

## 2014-10-16 LAB — CBC WITH DIFFERENTIAL/PLATELET
Basophils Absolute: 0.1 10*3/uL (ref 0–0.1)
Basophils Relative: 3 %
EOS ABS: 0.1 10*3/uL (ref 0–0.7)
EOS PCT: 2 %
HCT: 37.1 % — ABNORMAL LOW (ref 40.0–52.0)
Hemoglobin: 12.3 g/dL — ABNORMAL LOW (ref 13.0–18.0)
LYMPHS ABS: 0.8 10*3/uL — AB (ref 1.0–3.6)
LYMPHS PCT: 29 %
MCH: 31.6 pg (ref 26.0–34.0)
MCHC: 33 g/dL (ref 32.0–36.0)
MCV: 95.8 fL (ref 80.0–100.0)
MONOS PCT: 16 %
Monocytes Absolute: 0.5 10*3/uL (ref 0.2–1.0)
Neutro Abs: 1.4 10*3/uL (ref 1.4–6.5)
Neutrophils Relative %: 50 %
PLATELETS: 201 10*3/uL (ref 150–440)
RBC: 3.88 MIL/uL — AB (ref 4.40–5.90)
RDW: 15.5 % — ABNORMAL HIGH (ref 11.5–14.5)
WBC: 2.8 10*3/uL — AB (ref 3.8–10.6)

## 2014-10-16 LAB — PHOSPHORUS: PHOSPHORUS: 2.9 mg/dL (ref 2.5–4.6)

## 2014-10-16 LAB — MAGNESIUM: MAGNESIUM: 1.9 mg/dL (ref 1.7–2.4)

## 2014-10-16 LAB — CALCIUM: Calcium: 8.3 mg/dL — ABNORMAL LOW (ref 8.9–10.3)

## 2014-10-16 MED ORDER — ZOLEDRONIC ACID 4 MG/5ML IV CONC: 3.0000 mg | Freq: Once | INTRAVENOUS | Status: DC

## 2014-10-16 MED ORDER — ZOLEDRONIC ACID 4 MG/100ML IV SOLN
4.0000 mg | Freq: Once | INTRAVENOUS | Status: AC
  Administered 2014-10-16: 4 mg via INTRAVENOUS
  Filled 2014-10-16: qty 100

## 2014-10-16 MED ORDER — LENALIDOMIDE 15 MG PO CAPS: 15.0000 mg | ORAL_CAPSULE | Freq: Every day | ORAL | Status: DC

## 2014-10-16 MED ORDER — SODIUM CHLORIDE 0.9 % IV SOLN
INTRAVENOUS | Status: DC
Start: 2014-10-16 — End: 2014-10-16
  Administered 2014-10-16: 11:00:00 via INTRAVENOUS
  Filled 2014-10-16: qty 1000

## 2014-10-30 ENCOUNTER — Inpatient Hospital Stay: Payer: Medicare Other

## 2014-11-13 ENCOUNTER — Inpatient Hospital Stay: Payer: Medicare Other | Attending: Internal Medicine

## 2014-11-13 DIAGNOSIS — Z9221 Personal history of antineoplastic chemotherapy: Secondary | ICD-10-CM | POA: Diagnosis not present

## 2014-11-13 DIAGNOSIS — C9000 Multiple myeloma not having achieved remission: Secondary | ICD-10-CM | POA: Diagnosis not present

## 2014-11-13 LAB — CBC WITH DIFFERENTIAL/PLATELET
Basophils Absolute: 0 10*3/uL (ref 0–0.1)
Basophils Relative: 1 %
EOS ABS: 0.1 10*3/uL (ref 0–0.7)
Eosinophils Relative: 5 %
HEMATOCRIT: 36 % — AB (ref 40.0–52.0)
HEMOGLOBIN: 12.2 g/dL — AB (ref 13.0–18.0)
LYMPHS ABS: 1.1 10*3/uL (ref 1.0–3.6)
LYMPHS PCT: 37 %
MCH: 32.3 pg (ref 26.0–34.0)
MCHC: 33.8 g/dL (ref 32.0–36.0)
MCV: 95.6 fL (ref 80.0–100.0)
MONOS PCT: 15 %
Monocytes Absolute: 0.4 10*3/uL (ref 0.2–1.0)
NEUTROS PCT: 42 %
Neutro Abs: 1.2 10*3/uL — ABNORMAL LOW (ref 1.4–6.5)
Platelets: 180 10*3/uL (ref 150–440)
RBC: 3.77 MIL/uL — AB (ref 4.40–5.90)
RDW: 15.2 % — ABNORMAL HIGH (ref 11.5–14.5)
WBC: 2.9 10*3/uL — ABNORMAL LOW (ref 3.8–10.6)

## 2014-11-13 LAB — CREATININE, SERUM
CREATININE: 1.34 mg/dL — AB (ref 0.61–1.24)
GFR calc Af Amer: >60 mL/min (ref >60)
GFR calc non Af Amer: 52 mL/min — ABNORMAL LOW (ref >60)

## 2014-11-15 IMAGING — CR BILATERAL SACROILIAC JOINTS - 3+ VIEW
1 series · 4 of 4 positions shown · non-contrast
Comparison: none

REASON FOR EXAM: Mulitiple Myeloma w/o having Achieved Reamission
COMMENTS:

[Series 1: t sacrum ap · 0.14mm/px · 4 of 4 slices shown]
[im 1/4]
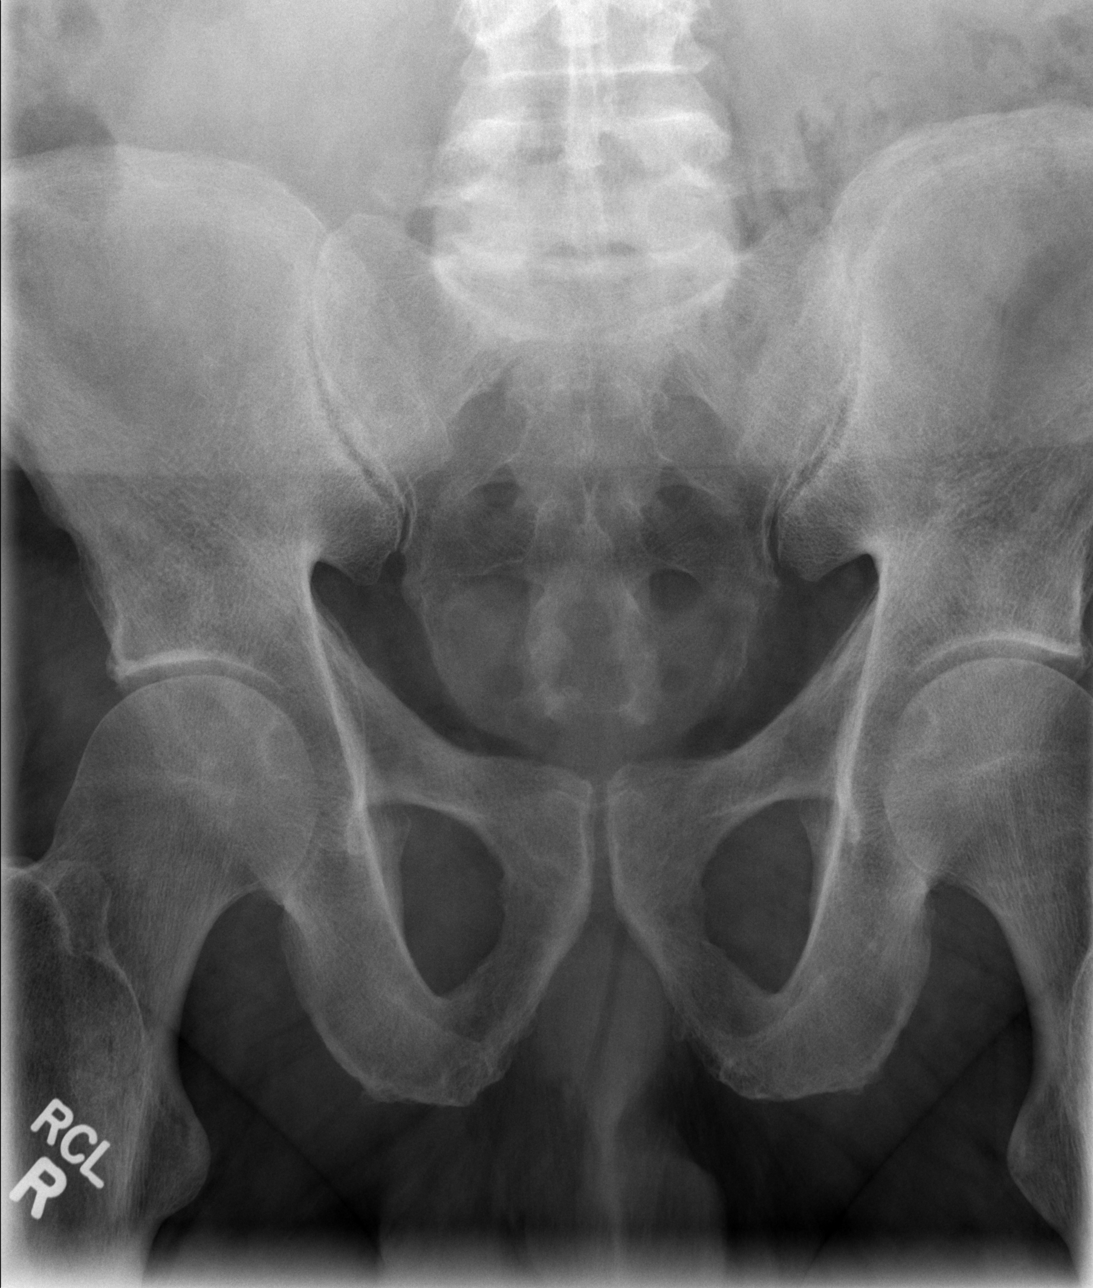
[im 2/4]
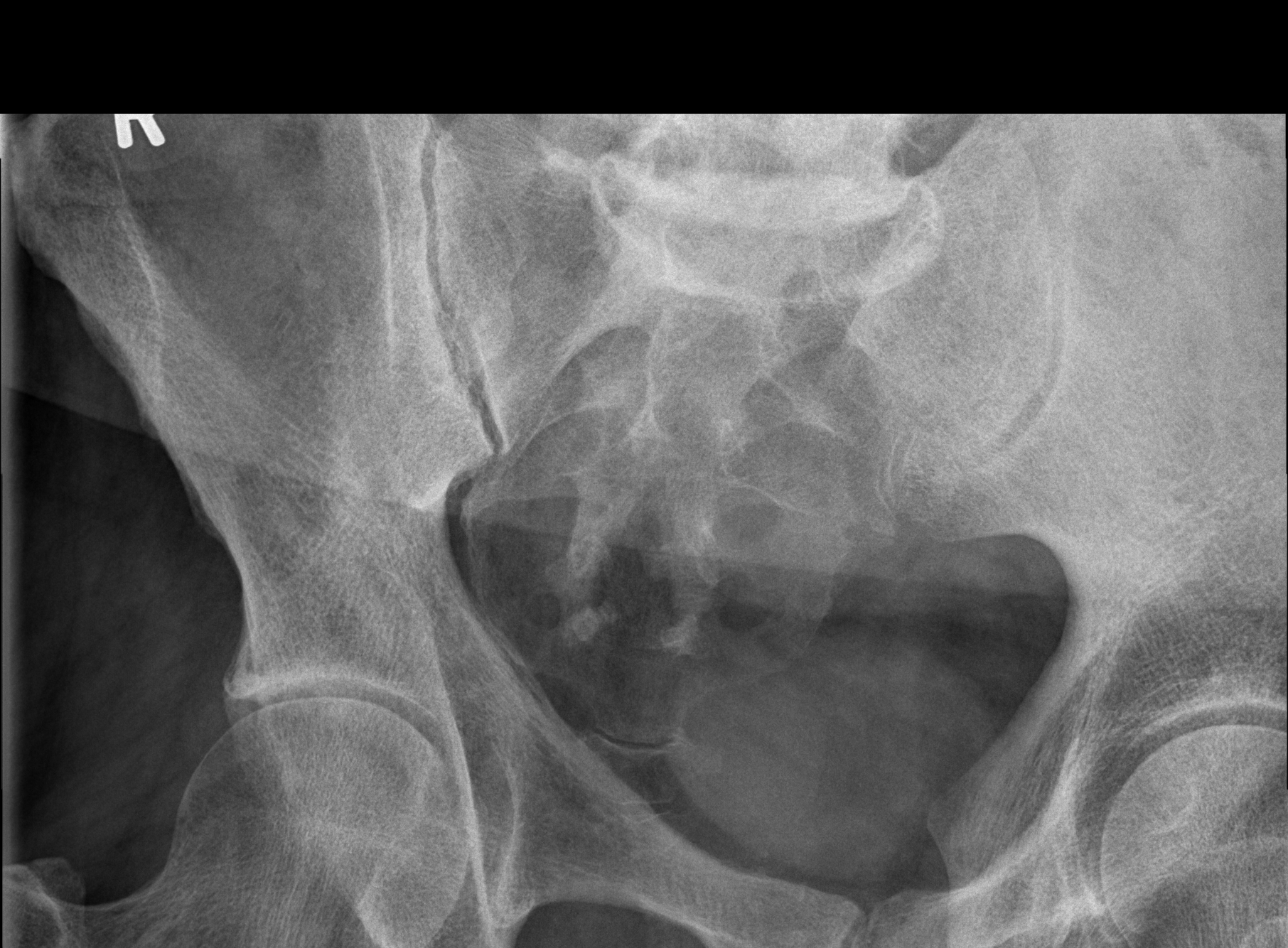
[im 3/4]
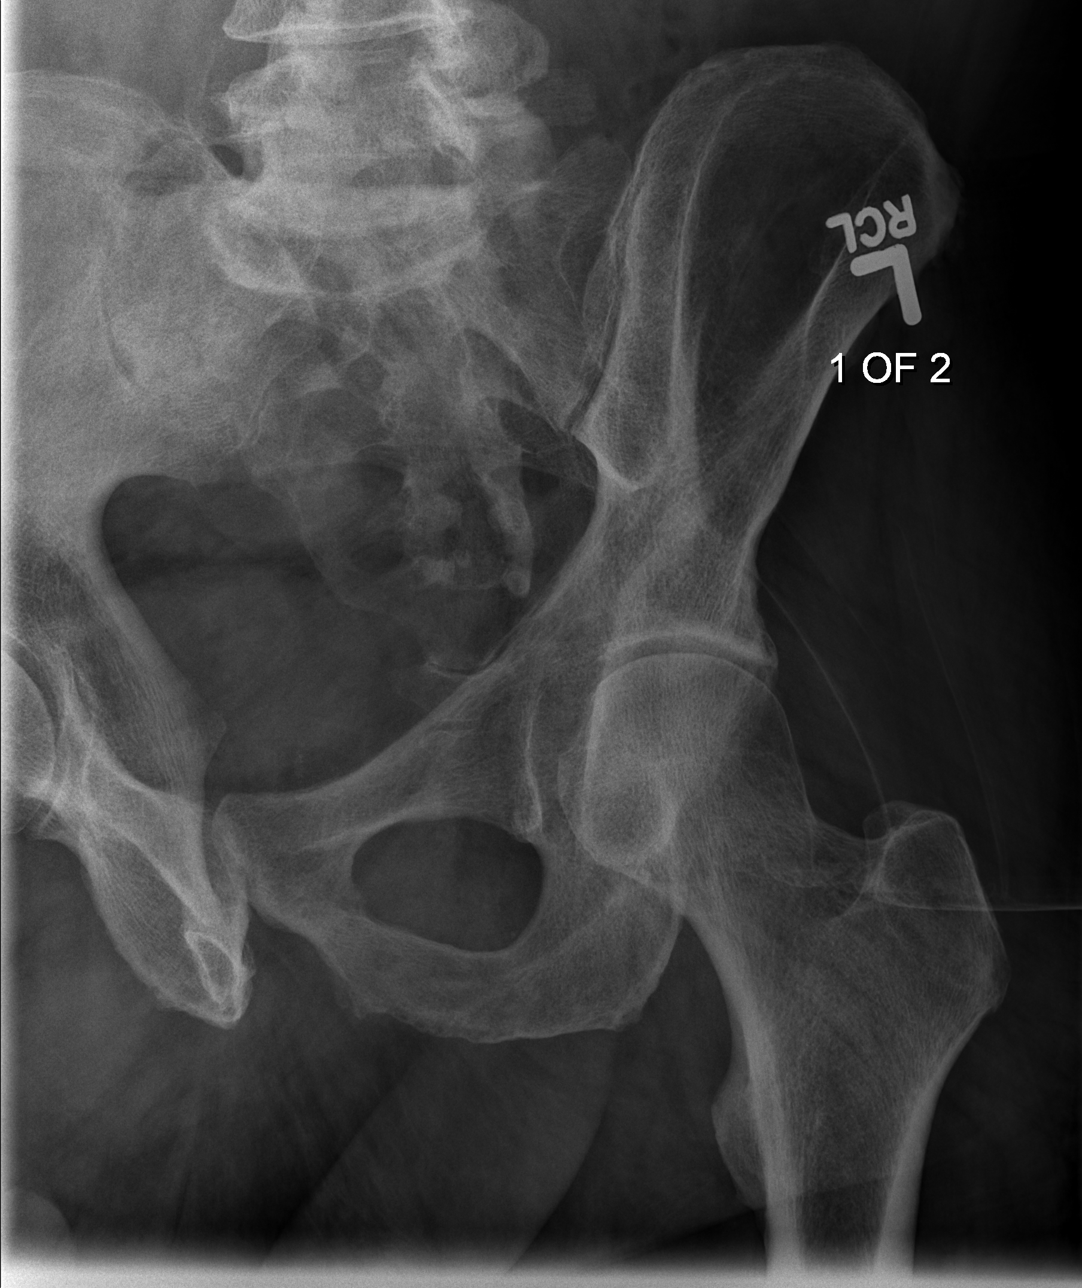
[im 4/4]
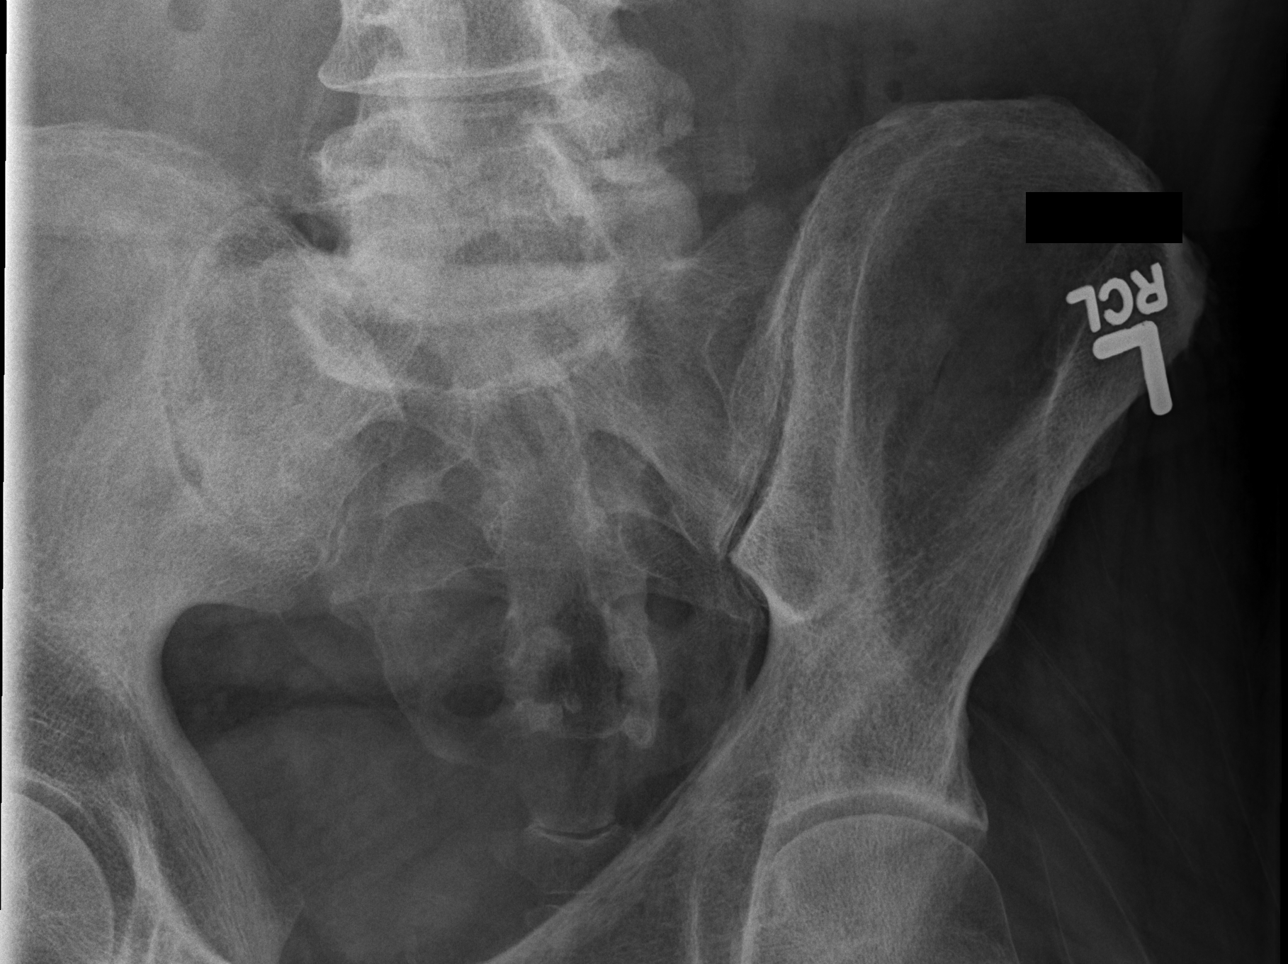

[4 of 4 positions shown; findings below may reference images not displayed]

PROCEDURE:     DXR - DXR SACROILIAC JOINTS  - October 31, 2012 [DATE]

RESULT:     And sacroiliac joints appear to be normal in size or with and
without a focal adjacent bony lesion, abnormal widening or definite
fracture. The sacral arches appear intact. There is no definite lytic or
sclerotic abnormality appreciated.
IMPRESSION: No focal bony abnormality evident.

[REDACTED]

## 2014-11-27 ENCOUNTER — Inpatient Hospital Stay: Payer: Medicare Other

## 2014-11-27 DIAGNOSIS — C9000 Multiple myeloma not having achieved remission: Secondary | ICD-10-CM

## 2014-11-27 LAB — CBC WITH DIFFERENTIAL/PLATELET
Basophils Absolute: 0 10*3/uL (ref 0–0.1)
Basophils Relative: 1 %
EOS ABS: 0.1 10*3/uL (ref 0–0.7)
Eosinophils Relative: 5 %
HEMATOCRIT: 36.8 % — AB (ref 40.0–52.0)
HEMOGLOBIN: 12.2 g/dL — AB (ref 13.0–18.0)
LYMPHS ABS: 0.7 10*3/uL — AB (ref 1.0–3.6)
Lymphocytes Relative: 25 %
MCH: 31.7 pg (ref 26.0–34.0)
MCHC: 33 g/dL (ref 32.0–36.0)
MCV: 96 fL (ref 80.0–100.0)
MONO ABS: 0.3 10*3/uL (ref 0.2–1.0)
MONOS PCT: 12 %
NEUTROS ABS: 1.7 10*3/uL (ref 1.4–6.5)
NEUTROS PCT: 57 %
Platelets: 212 10*3/uL (ref 150–440)
RBC: 3.83 MIL/uL — ABNORMAL LOW (ref 4.40–5.90)
RDW: 15.5 % — AB (ref 11.5–14.5)
WBC: 3 10*3/uL — ABNORMAL LOW (ref 3.8–10.6)

## 2014-11-27 LAB — HEPATIC FUNCTION PANEL
ALK PHOS: 99 U/L (ref 38–126)
ALT: 22 U/L (ref 17–63)
AST: 21 U/L (ref 15–41)
Albumin: 3.5 g/dL (ref 3.5–5.0)
BILIRUBIN DIRECT: 0.2 mg/dL (ref 0.1–0.5)
BILIRUBIN INDIRECT: 0.5 mg/dL (ref 0.3–0.9)
BILIRUBIN TOTAL: 0.7 mg/dL (ref 0.3–1.2)
Total Protein: 7.4 g/dL (ref 6.5–8.1)

## 2014-11-28 LAB — IGG, IGA, IGM
IGM, SERUM: 56 mg/dL (ref 20–172)
IgA: 476 mg/dL — ABNORMAL HIGH (ref 61–437)
IgG (Immunoglobin G), Serum: 1620 mg/dL — ABNORMAL HIGH (ref 700–1600)

## 2014-11-29 LAB — KAPPA/LAMBDA LIGHT CHAINS
KAPPA, LAMDA LIGHT CHAIN RATIO: 1.67 — AB (ref 0.26–1.65)
Kappa free light chain: 78.16 mg/L — ABNORMAL HIGH (ref 3.30–19.40)
LAMDA FREE LIGHT CHAINS: 46.78 mg/L — AB (ref 5.71–26.30)

## 2014-11-30 LAB — PROTEIN ELECTROPHORESIS, SERUM
A/G Ratio: 0.8 (ref 0.7–1.7)
ALPHA-1-GLOBULIN: 0.3 g/dL (ref 0.0–0.4)
Albumin ELP: 3 g/dL (ref 2.9–4.4)
Alpha-2-Globulin: 0.6 g/dL (ref 0.4–1.0)
Beta Globulin: 1.1 g/dL (ref 0.7–1.3)
GAMMA GLOBULIN: 1.6 g/dL (ref 0.4–1.8)
GLOBULIN, TOTAL: 3.6 g/dL (ref 2.2–3.9)
TOTAL PROTEIN ELP: 6.6 g/dL (ref 6.0–8.5)

## 2014-12-04 ENCOUNTER — Other Ambulatory Visit: Payer: Self-pay | Admitting: *Deleted

## 2014-12-04 DIAGNOSIS — C9000 Multiple myeloma not having achieved remission: Secondary | ICD-10-CM

## 2014-12-07 ENCOUNTER — Other Ambulatory Visit: Payer: Self-pay | Admitting: *Deleted

## 2014-12-07 DIAGNOSIS — C9000 Multiple myeloma not having achieved remission: Secondary | ICD-10-CM

## 2014-12-07 MED ORDER — LENALIDOMIDE 15 MG PO CAPS
ORAL_CAPSULE | ORAL | Status: DC
Start: 2014-12-07 — End: 2015-03-26

## 2014-12-07 NOTE — Progress Notes (Signed)
rx for revlimid 15 mg. 1 capsule daily x 3 weeks. Then off for 1 week.  [facility]ed to [facility].  REMS auth # [id number].  Patient will see Dr. Rogue Bussing on Friday, per md request.

## 2014-12-08 ENCOUNTER — Telehealth: Payer: Self-pay | Admitting: *Deleted

## 2014-12-08 ENCOUNTER — Other Ambulatory Visit: Payer: Self-pay | Admitting: *Deleted

## 2014-12-08 DIAGNOSIS — C9000 Multiple myeloma not having achieved remission: Secondary | ICD-10-CM

## 2014-12-08 NOTE — Telephone Encounter (Signed)
I spoke with [facility] who states they received rx for his revlimid. I told them to disregard request, this needs to come from specialty pharmacy

## 2014-12-08 NOTE — Telephone Encounter (Signed)
Called to report that he gets his revlimid from mail, but his local pharmacy called stating they have a prescription for him and he is confused,Please call him regarding this

## 2014-12-11 ENCOUNTER — Inpatient Hospital Stay: Payer: Medicare Other

## 2014-12-11 ENCOUNTER — Encounter: Payer: Self-pay | Admitting: Internal Medicine

## 2014-12-11 ENCOUNTER — Inpatient Hospital Stay: Payer: Medicare Other | Attending: Internal Medicine | Admitting: Internal Medicine

## 2014-12-11 VITALS — BP 160/88 | HR 56 | Temp 96.7°F | Wt 235.2 lb

## 2014-12-11 DIAGNOSIS — I10 Essential (primary) hypertension: Secondary | ICD-10-CM

## 2014-12-11 DIAGNOSIS — E119 Type 2 diabetes mellitus without complications: Secondary | ICD-10-CM

## 2014-12-11 DIAGNOSIS — C9000 Multiple myeloma not having achieved remission: Secondary | ICD-10-CM | POA: Insufficient documentation

## 2014-12-11 DIAGNOSIS — E785 Hyperlipidemia, unspecified: Secondary | ICD-10-CM | POA: Insufficient documentation

## 2014-12-11 DIAGNOSIS — Z9221 Personal history of antineoplastic chemotherapy: Secondary | ICD-10-CM | POA: Diagnosis not present

## 2014-12-11 DIAGNOSIS — Z85850 Personal history of malignant neoplasm of thyroid: Secondary | ICD-10-CM | POA: Insufficient documentation

## 2014-12-11 DIAGNOSIS — Z7982 Long term (current) use of aspirin: Secondary | ICD-10-CM | POA: Insufficient documentation

## 2014-12-11 DIAGNOSIS — C9001 Multiple myeloma in remission: Secondary | ICD-10-CM

## 2014-12-11 DIAGNOSIS — Z79899 Other long term (current) drug therapy: Secondary | ICD-10-CM | POA: Diagnosis not present

## 2014-12-11 LAB — CREATININE, SERUM
Creatinine, Ser: 1.41 mg/dL — ABNORMAL HIGH (ref 0.61–1.24)
GFR calc Af Amer: 57 mL/min — ABNORMAL LOW (ref >60)
GFR, EST NON AFRICAN AMERICAN: 49 mL/min — AB (ref >60)

## 2014-12-11 LAB — CBC WITH DIFFERENTIAL/PLATELET
BASOS PCT: 1 %
Basophils Absolute: 0 10*3/uL (ref 0–0.1)
Eosinophils Absolute: 0.2 10*3/uL (ref 0–0.7)
Eosinophils Relative: 6 %
HEMATOCRIT: 37 % — AB (ref 40.0–52.0)
HEMOGLOBIN: 12.1 g/dL — AB (ref 13.0–18.0)
LYMPHS ABS: 1.2 10*3/uL (ref 1.0–3.6)
LYMPHS PCT: 40 %
MCH: 31.3 pg (ref 26.0–34.0)
MCHC: 32.7 g/dL (ref 32.0–36.0)
MCV: 95.8 fL (ref 80.0–100.0)
MONO ABS: 0.5 10*3/uL (ref 0.2–1.0)
MONOS PCT: 17 %
NEUTROS ABS: 1.1 10*3/uL — AB (ref 1.4–6.5)
NEUTROS PCT: 36 %
Platelets: 170 10*3/uL (ref 150–440)
RBC: 3.86 MIL/uL — ABNORMAL LOW (ref 4.40–5.90)
RDW: 15.5 % — AB (ref 11.5–14.5)
WBC: 3 10*3/uL — ABNORMAL LOW (ref 3.8–10.6)

## 2014-12-11 LAB — CALCIUM: Calcium: 8.3 mg/dL — ABNORMAL LOW (ref 8.9–10.3)

## 2014-12-11 MED ORDER — ZOLEDRONIC ACID 4 MG/5ML IV CONC: 3.0000 mg | Freq: Once | INTRAVENOUS | Status: DC

## 2014-12-11 MED ORDER — SODIUM CHLORIDE 0.9 % IV SOLN
INTRAVENOUS | Status: DC
  Administered 2014-12-11: 14:00:00 via INTRAVENOUS
  Filled 2014-12-11: qty 1000

## 2014-12-11 MED ORDER — ZOLEDRONIC ACID 4 MG/100ML IV SOLN
4.0000 mg | Freq: Once | INTRAVENOUS | Status: AC
  Administered 2014-12-11: 4 mg via INTRAVENOUS
  Filled 2014-12-11: qty 100

## 2014-12-11 NOTE — Progress Notes (Signed)
[facility] OFFICE PROGRESS NOTE  Patient Care Team: Madelyn Brunner, MD as PCP - General (Internal Medicine)   SUMMARY OF ONCOLOGIC HISTORY:  # April 2014- MULTIPLE MYELOMA [IgG- 2.2gm/dl; 40-50% plasma cell; Cyto-N; FISH- gain of chr.9, 15 & ccnd1/11q13] s/p RVD x6 [sep 2014; BMBx- ~5% plasma cells]; Maint Rev-DEX-Zometa; March 2015-Stopped Dex Cont- Rev-Zometa; July 2016- Rev 14m; NOV 4th  2016-HOLD  # CKD [creat ~1.4; sec MM]; DM  INTERVAL HISTORY:  This is my first interaction with the patient since I joined the practice September 2016. I reviewed the patient's prior chart/pertinent labs/imaging in detail; findings are summarized.  A pleasant  69year old male patient with above history of multiple myeloma currently on maintenance Revlimid is here for follow-up. Patient Zometa have to be held  in July because of slightly elevated creatinine at 1.7; patient baseline Is around 1.3-1.4  Patient denies any abdominal pain denies any diarrhea. Denies any skin rash. Denies any bone pain. Appetite is good. He denies any chest pain or shortness of breath or cough. Denies any swelling in the legs. No tingling or numbness.  Patient denies having a recollection of meeting with bone marrow transplant doctors.   REVIEW OF SYSTEMS:  A complete 10 point review of system is done which is negative except mentioned above/history of present illness.   PAST MEDICAL HISTORY :  Past Medical History  Diagnosis Date  . Multiple myeloma ([facility]   . Diabetes mellitus without complication ([facility]   . Hypertension   . Thyroid disease   . GERD (gastroesophageal reflux disease)   . Hyperlipidemia   . Obesity   . Thyroid cancer ([facility]     PAST SURGICAL HISTORY :   Past Surgical History  Procedure Laterality Date  . Bone marrow biopsy  2014    FAMILY HISTORY :  No family history on file.  SOCIAL HISTORY:   Social History  Substance Use Topics  . Smoking status: Never Smoker   .  Smokeless tobacco: Never Used  . Alcohol Use: No    ALLERGIES:  has No Known Allergies.  MEDICATIONS:  Current Outpatient Prescriptions  Medication Sig Dispense Refill  . acyclovir (ZOVIRAX) 400 MG tablet Take 400 mg by mouth daily.    .Marland KitchenamLODipine (NORVASC) 10 MG tablet Take 10 mg by mouth daily.    .Marland Kitchenaspirin EC 81 MG tablet Take 81 mg by mouth daily.    .Marland Kitchenatorvastatin (LIPITOR) 20 MG tablet Take 20 mg by mouth daily.    . cloNIDine (CATAPRES) 0.1 MG tablet Take 0.1 mg by mouth 2 (two) times daily.    .Marland Kitchenlenalidomide (REVLIMID) 15 MG capsule Daily for 3 weeks and then 1 week off 21 capsule 4  . levothyroxine (SYNTHROID) 200 MCG tablet Take 1 tablet by mouth daily.    .Marland Kitchenlosartan (COZAAR) 100 MG tablet Take 100 mg by mouth daily.    . pioglitazone (ACTOS) 30 MG tablet Take 30 mg by mouth daily.    . quinapril (ACCUPRIL) 40 MG tablet Take 40 mg by mouth daily.    . sennosides-docusate sodium (SENOKOT-S) 8.6-50 MG tablet Take 2 tablets by mouth daily.     No current facility-administered medications for this visit.    PHYSICAL EXAMINATION: ECOG PERFORMANCE STATUS: 0 - Asymptomatic  BP 160/88 mmHg  Pulse 56  Temp(Src) 96.7 F (35.9 C) (Tympanic)  Wt 235 lb 3.7 oz (106.7 kg)  Filed Weights   12/11/14 1333  Weight: 235 lb 3.7 oz (  106.7 kg)    GENERAL: Well-nourished well-developed; Alert, no distress and comfortable.   Alone.  EYES: no pallor or icterus OROPHARYNX: no thrush or ulceration; good dentition  NECK: supple, no masses felt LYMPH:  no palpable lymphadenopathy in the cervical, axillary or inguinal regions LUNGS: clear to auscultation and  No wheeze or crackles HEART/CVS: regular rate & rhythm and no murmurs; No lower extremity edema ABDOMEN:abdomen soft, non-tender and normal bowel sounds Musculoskeletal:no cyanosis of digits and no clubbing  PSYCH: alert & oriented x 3 with fluent speech NEURO: no focal motor/sensory deficits SKIN:  no rashes or significant  lesions  LABORATORY DATA:  I have reviewed the data as listed    Component Value Date/Time   NA 141 08/21/2014 1336   NA 139 05/01/2014 1322   K 4.2 08/21/2014 1336   K 4.4 05/01/2014 1322   CL 109 08/21/2014 1336   CL 107 05/01/2014 1322   CO2 24 08/21/2014 1336   CO2 27 05/01/2014 1322   GLUCOSE 124* 08/21/2014 1336   GLUCOSE 102* 05/01/2014 1322   BUN 27* 08/21/2014 1336   BUN 16 05/01/2014 1322   CREATININE 1.41* 12/11/2014 1316   CREATININE 1.48* 05/29/2014 0949   CALCIUM 8.3* 12/11/2014 1316   CALCIUM 9.1 05/01/2014 1322   PROT 7.4 11/27/2014 0931   PROT 7.5 05/01/2014 1322   ALBUMIN 3.5 11/27/2014 0931   ALBUMIN 3.6 05/01/2014 1322   AST 21 11/27/2014 0931   AST 17 05/01/2014 1322   ALT 22 11/27/2014 0931   ALT 13* 05/01/2014 1322   ALKPHOS 99 11/27/2014 0931   ALKPHOS 47 05/01/2014 1322   BILITOT 0.7 11/27/2014 0931   BILITOT 0.6 05/01/2014 1322   GFRNONAA 49* 12/11/2014 1316   GFRNONAA 47* 05/29/2014 0949   GFRNONAA 56* 03/10/2014 0946   GFRAA 57* 12/11/2014 1316   GFRAA 55* 05/29/2014 0949   GFRAA >60 03/10/2014 0946    No results found for: SPEP, UPEP  Lab Results  Component Value Date   WBC 3.0* 12/11/2014   NEUTROABS 1.1* 12/11/2014   HGB 12.1* 12/11/2014   HCT 37.0* 12/11/2014   MCV 95.8 12/11/2014   PLT 170 12/11/2014      Chemistry      Component Value Date/Time   NA 141 08/21/2014 1336   NA 139 05/01/2014 1322   K 4.2 08/21/2014 1336   K 4.4 05/01/2014 1322   CL 109 08/21/2014 1336   CL 107 05/01/2014 1322   CO2 24 08/21/2014 1336   CO2 27 05/01/2014 1322   BUN 27* 08/21/2014 1336   BUN 16 05/01/2014 1322   CREATININE 1.41* 12/11/2014 1316   CREATININE 1.48* 05/29/2014 0949      Component Value Date/Time   CALCIUM 8.3* 12/11/2014 1316   CALCIUM 9.1 05/01/2014 1322   ALKPHOS 99 11/27/2014 0931   ALKPHOS 47 05/01/2014 1322   AST 21 11/27/2014 0931   AST 17 05/01/2014 1322   ALT 22 11/27/2014 0931   ALT 13* 05/01/2014 1322    BILITOT 0.7 11/27/2014 0931   BILITOT 0.6 05/01/2014 1322       RADIOGRAPHIC STUDIES: I have personally reviewed the radiological images as listed and agreed with the findings in the report. No results found.   ASSESSMENT & PLAN:   Multiple myeloma currently on maintenance Revlimid 25mg  3 w-On & 1 w Off; at least since September 2014. Patient tolerating maintenance Revlimid very well. Patient's most recent serum M protein October 2016- negative; serum free kappa/lambda light  chain ratio slightly abnormal at 1.67.   Given the patient's excellent performance status; and the recent data with improved  Progression free survival with bone marrow transplant- I discussed at length regarding the types of bone marrow transplant; and that he might be a good candidate for a autologous bone marrow transplant evaluation. Patient does not recollect being evaluated by transplant physicians the past.  # I had a long discussion with him the fact that he has been on Revlimid for 2 years- might make it difficult for stem cell harvest. However I would at least have him be evaluated by transplant center for the recommendations.  # given the patient's excellent response to therapy so for; I think it's reasonable to hold the Revlimid for the next 4 weeks until he meets transplant physicians at [facility].  # . Creatinine is 1.4- okay to proceed with Zometa 3 mg every 12 weeks.  # patient follow-up with me in approximately 4 weeks; CBC CMP at next visit.    No orders of the defined types were placed in this encounter.   All questions were answered. The patient knows to call the clinic with any problems, questions or concerns. No barriers to learning was detected.  I spent 30 minutes counseling the patient face to face. The total time spent in the appointment was 45 minutes and more than 50% was on counseling and review of test results     Cammie Sickle, MD 12/11/2014 1:55 PM

## 2014-12-21 ENCOUNTER — Telehealth: Payer: Self-pay | Admitting: *Deleted

## 2014-12-21 NOTE — Telephone Encounter (Signed)
The patient has an appointment with Dr. Virgina Norfolk on 12/28/14 at 11am to discuss a bone marrow transplant.  [facility] requesting bone marrow slides. I explained that these slides must be retrieved from [facility]. The phone number was provided. She states that medical records has not fedex the patient's MRI images.  We will have medical records do this for them. The address is [place]  [address]   [place], [place] [address]    The fed x # is [phone]

## 2015-01-08 ENCOUNTER — Encounter: Payer: Self-pay | Admitting: Internal Medicine

## 2015-01-08 ENCOUNTER — Inpatient Hospital Stay: Payer: Medicare Other | Attending: Internal Medicine

## 2015-01-08 ENCOUNTER — Inpatient Hospital Stay (HOSPITAL_BASED_OUTPATIENT_CLINIC_OR_DEPARTMENT_OTHER): Payer: Medicare Other | Admitting: Internal Medicine

## 2015-01-08 VITALS — BP 162/83 | HR 64 | Temp 97.4°F | Wt 237.7 lb

## 2015-01-08 DIAGNOSIS — C9000 Multiple myeloma not having achieved remission: Secondary | ICD-10-CM | POA: Diagnosis not present

## 2015-01-08 DIAGNOSIS — Z7982 Long term (current) use of aspirin: Secondary | ICD-10-CM

## 2015-01-08 DIAGNOSIS — N189 Chronic kidney disease, unspecified: Secondary | ICD-10-CM

## 2015-01-08 DIAGNOSIS — K219 Gastro-esophageal reflux disease without esophagitis: Secondary | ICD-10-CM | POA: Insufficient documentation

## 2015-01-08 DIAGNOSIS — Z79899 Other long term (current) drug therapy: Secondary | ICD-10-CM | POA: Diagnosis not present

## 2015-01-08 DIAGNOSIS — I129 Hypertensive chronic kidney disease with stage 1 through stage 4 chronic kidney disease, or unspecified chronic kidney disease: Secondary | ICD-10-CM

## 2015-01-08 DIAGNOSIS — E785 Hyperlipidemia, unspecified: Secondary | ICD-10-CM | POA: Insufficient documentation

## 2015-01-08 DIAGNOSIS — Z85850 Personal history of malignant neoplasm of thyroid: Secondary | ICD-10-CM

## 2015-01-08 DIAGNOSIS — E079 Disorder of thyroid, unspecified: Secondary | ICD-10-CM | POA: Insufficient documentation

## 2015-01-08 DIAGNOSIS — C9001 Multiple myeloma in remission: Secondary | ICD-10-CM

## 2015-01-08 DIAGNOSIS — E119 Type 2 diabetes mellitus without complications: Secondary | ICD-10-CM | POA: Insufficient documentation

## 2015-01-08 LAB — COMPREHENSIVE METABOLIC PANEL WITH GFR
ALT: 12 U/L — ABNORMAL LOW (ref 17–63)
AST: 17 U/L (ref 15–41)
Albumin: 3.7 g/dL (ref 3.5–5.0)
Alkaline Phosphatase: 56 U/L (ref 38–126)
Anion gap: 6 (ref 5–15)
BUN: 23 mg/dL — ABNORMAL HIGH (ref 6–20)
CO2: 27 mmol/L (ref 22–32)
Calcium: 9 mg/dL (ref 8.9–10.3)
Chloride: 106 mmol/L (ref 101–111)
Creatinine, Ser: 1.44 mg/dL — ABNORMAL HIGH (ref 0.61–1.24)
GFR calc Af Amer: 55 mL/min — ABNORMAL LOW
GFR calc non Af Amer: 48 mL/min — ABNORMAL LOW
Glucose, Bld: 100 mg/dL — ABNORMAL HIGH (ref 65–99)
Potassium: 4.2 mmol/L (ref 3.5–5.1)
Sodium: 139 mmol/L (ref 135–145)
Total Bilirubin: 0.7 mg/dL (ref 0.3–1.2)
Total Protein: 7.7 g/dL (ref 6.5–8.1)

## 2015-01-08 LAB — CBC WITH DIFFERENTIAL/PLATELET
Basophils Absolute: 0 10*3/uL (ref 0–0.1)
Basophils Relative: 1 %
Eosinophils Absolute: 0.2 10*3/uL (ref 0–0.7)
Eosinophils Relative: 5 %
HEMATOCRIT: 37.9 % — AB (ref 40.0–52.0)
Hemoglobin: 12.4 g/dL — ABNORMAL LOW (ref 13.0–18.0)
LYMPHS PCT: 29 %
Lymphs Abs: 1 10*3/uL (ref 1.0–3.6)
MCH: 31.8 pg (ref 26.0–34.0)
MCHC: 32.7 g/dL (ref 32.0–36.0)
MCV: 97 fL (ref 80.0–100.0)
MONO ABS: 0.4 10*3/uL (ref 0.2–1.0)
MONOS PCT: 13 %
NEUTROS ABS: 1.8 10*3/uL (ref 1.4–6.5)
Neutrophils Relative %: 52 %
Platelets: 189 10*3/uL (ref 150–440)
RBC: 3.9 MIL/uL — ABNORMAL LOW (ref 4.40–5.90)
RDW: 16.5 % — AB (ref 11.5–14.5)
WBC: 3.5 10*3/uL — ABNORMAL LOW (ref 3.8–10.6)

## 2015-01-08 NOTE — Progress Notes (Signed)
[facility] OFFICE PROGRESS NOTE  Patient Care Team: Madelyn Brunner, MD as PCP - General (Internal Medicine)   SUMMARY OF ONCOLOGIC HISTORY:  # April 2014- MULTIPLE MYELOMA [IgG- 2.2gm/dl; 40-50% plasma cell; Cyto-N; FISH- gain of chr.9, 15 & ccnd1/11q13] s/p RVD x6 [sep 2014; BMBx- ~5% plasma cells]; Maint Rev-DEX-Zometa; March 2015-Stopped Dex Cont- Rev-Zometa; July 2016- Rev 61m; NOV 4th  2016-HOLD  # CKD [creat ~1.4; sec MM]; DM  INTERVAL HISTORY:  A pleasant  69year old male patient with above history of multiple myeloma currently on maintenance Revlimid is here for follow-up. Patient Zometa have to be held  in July because of slightly elevated creatinine at 1.7; patient baseline Is around 1.3-1.4. Revlimid has been on hold for the last 1 month.  Patient in the interim has [facility] He has been recommended autologous since her transplant; however patient wants to hold off the stem cell transplant as he thinks is doing well currently.  Patient denies any abdominal pain denies any diarrhea. Denies any skin rash. Denies any bone pain. Appetite is good. He denies any chest pain or shortness of breath or cough. Denies any swelling in the legs. No tingling or numbness.  REVIEW OF SYSTEMS:  A complete 10 point review of system is done which is negative except mentioned above/history of present illness.   PAST MEDICAL HISTORY :  Past Medical History  Diagnosis Date  . Multiple myeloma ([place]   . Diabetes mellitus without complication ([place]   . Hypertension   . Thyroid disease   . GERD (gastroesophageal reflux disease)   . Hyperlipidemia   . Obesity   . Thyroid cancer (HS[place]     PAST SURGICAL HISTORY :   Past Surgical History  Procedure Laterality Date  . Bone marrow biopsy  2014    FAMILY HISTORY :  History reviewed. No pertinent family history.  SOCIAL HISTORY:   Social History  Substance Use Topics  . Smoking  status: Never Smoker   . Smokeless tobacco: Never Used  . Alcohol Use: No    ALLERGIES:  has No Known Allergies.  MEDICATIONS:  Current Outpatient Prescriptions  Medication Sig Dispense Refill  . amLODipine (NORVASC) 10 MG tablet Take 10 mg by mouth daily.    .Marland Kitchenaspirin EC 81 MG tablet Take 81 mg by mouth daily.    .Marland Kitchenatorvastatin (LIPITOR) 20 MG tablet Take 20 mg by mouth daily.    . cloNIDine (CATAPRES) 0.1 MG tablet Take 0.1 mg by mouth 2 (two) times daily.    .Marland Kitchenlenalidomide (REVLIMID) 15 MG capsule Daily for 3 weeks and then 1 week off 21 capsule 4  . levothyroxine (SYNTHROID) 200 MCG tablet Take 1 tablet by mouth daily.    .Marland Kitchenlosartan (COZAAR) 100 MG tablet Take 100 mg by mouth daily.    .Glory RosebushDELICA LANCETS 381EMISC Inject 1 Lancet as directed once daily.    . pioglitazone (ACTOS) 30 MG tablet Take 30 mg by mouth daily.    . potassium chloride SA (K-DUR,KLOR-CON) 20 MEQ tablet   0  . quinapril (ACCUPRIL) 40 MG tablet Take 40 mg by mouth daily.    . sennosides-docusate sodium (SENOKOT-S) 8.6-50 MG tablet Take 2 tablets by mouth daily.    . Cyanocobalamin (RA VITAMIN B-12 TR) 1000 MCG TBCR Take by mouth.     No current facility-administered medications for this visit.   Facility-Administered Medications Ordered in Other Visits  Medication  Dose Route Frequency Provider Last Rate Last Dose  . 0.9 %  sodium chloride infusion   Intravenous Continuous Cammie Sickle, MD   Stopped at 12/11/14 1528    PHYSICAL EXAMINATION: ECOG PERFORMANCE STATUS: 0 - Asymptomatic  BP 162/83 mmHg  Pulse 64  Temp(Src) 97.4 F (36.3 C) (Tympanic)  Wt 237 lb 10.5 oz (107.8 kg)  Filed Weights   01/08/15 1502  Weight: 237 lb 10.5 oz (107.8 kg)    GENERAL: Well-nourished well-developed; Alert, no distress and comfortable. Accompanied by his wife. No pallor or icterus OROPHARYNX: no thrush or ulceration; good dentition  NECK: supple, no masses felt LYMPH:  no palpable lymphadenopathy in  the cervical, axillary or inguinal regions LUNGS: clear to auscultation and  No wheeze or crackles HEART/CVS: regular rate & rhythm and no murmurs; No lower extremity edema ABDOMEN:abdomen soft, non-tender and normal bowel sounds Musculoskeletal:no cyanosis of digits and no clubbing  PSYCH: alert & oriented x 3 with fluent speech NEURO: no focal motor/sensory deficits SKIN:  no rashes or significant lesions  LABORATORY DATA:  I have reviewed the data as listed    Component Value Date/Time   NA 139 01/08/2015 1438   NA 139 05/01/2014 1322   K 4.2 01/08/2015 1438   K 4.4 05/01/2014 1322   CL 106 01/08/2015 1438   CL 107 05/01/2014 1322   CO2 27 01/08/2015 1438   CO2 27 05/01/2014 1322   GLUCOSE 100* 01/08/2015 1438   GLUCOSE 102* 05/01/2014 1322   BUN 23* 01/08/2015 1438   BUN 16 05/01/2014 1322   CREATININE 1.44* 01/08/2015 1438   CREATININE 1.48* 05/29/2014 0949   CALCIUM 9.0 01/08/2015 1438   CALCIUM 9.1 05/01/2014 1322   PROT 7.7 01/08/2015 1438   PROT 7.5 05/01/2014 1322   ALBUMIN 3.7 01/08/2015 1438   ALBUMIN 3.6 05/01/2014 1322   AST 17 01/08/2015 1438   AST 17 05/01/2014 1322   ALT 12* 01/08/2015 1438   ALT 13* 05/01/2014 1322   ALKPHOS 56 01/08/2015 1438   ALKPHOS 47 05/01/2014 1322   BILITOT 0.7 01/08/2015 1438   BILITOT 0.6 05/01/2014 1322   GFRNONAA 48* 01/08/2015 1438   GFRNONAA 47* 05/29/2014 0949   GFRNONAA 56* 03/10/2014 0946   GFRAA 55* 01/08/2015 1438   GFRAA 55* 05/29/2014 0949   GFRAA >60 03/10/2014 0946    No results found for: SPEP, UPEP  Lab Results  Component Value Date   WBC 3.5* 01/08/2015   NEUTROABS 1.8 01/08/2015   HGB 12.4* 01/08/2015   HCT 37.9* 01/08/2015   MCV 97.0 01/08/2015   PLT 189 01/08/2015      Chemistry      Component Value Date/Time   NA 139 01/08/2015 1438   NA 139 05/01/2014 1322   K 4.2 01/08/2015 1438   K 4.4 05/01/2014 1322   CL 106 01/08/2015 1438   CL 107 05/01/2014 1322   CO2 27 01/08/2015 1438    CO2 27 05/01/2014 1322   BUN 23* 01/08/2015 1438   BUN 16 05/01/2014 1322   CREATININE 1.44* 01/08/2015 1438   CREATININE 1.48* 05/29/2014 0949      Component Value Date/Time   CALCIUM 9.0 01/08/2015 1438   CALCIUM 9.1 05/01/2014 1322   ALKPHOS 56 01/08/2015 1438   ALKPHOS 47 05/01/2014 1322   AST 17 01/08/2015 1438   AST 17 05/01/2014 1322   ALT 12* 01/08/2015 1438   ALT 13* 05/01/2014 1322   BILITOT 0.7 01/08/2015 1438   BILITOT  0.6 05/01/2014 1322       RADIOGRAPHIC STUDIES: I have personally reviewed the radiological images as listed and agreed with the findings in the report. No results found.   ASSESSMENT & PLAN:  Multiple myeloma currently on maintenance Revlimid 72m 3 w-On & 1 w Off; at least since September 2014. Patient tolerating maintenance Revlimid very well. Patient's most recent serum M protein October 2016- negative; serum free kappa/lambda light chain ratio slightly abnormal at 1.67.   # Revlimid is on hold for the last 1 month in anticipation of possible bone marrow transplant. [facility] wants to hold off the transplant at this time.  # However I discussed with the patient that it is interested in having his stem cells harvested- this is reasonable Option; if he decides to do the transplant later on. He understands that Revlimid can interfere with stem cell harvest.  # I have reviewed the note from Dr. BChrisandra Carota WClydene Lamingin detail; appreciate the recommendations.  # Creatinine is 1.4- on  Zometa 3 mg every 12 weeks.  # patient follow-up with me in approximately 6 weeks; CBC CMP at next visit. [facility] at [phone]   # Patient will follow-up with me in approximately 6 weeks. So for now we'll continue to hold Revlimid until we Figure out if the patient plan for stem cell harvest.    # [facility]      GCammie Sickle  MD 01/08/2015 3:15 PM

## 2015-01-11 ENCOUNTER — Other Ambulatory Visit: Payer: Self-pay | Admitting: Internal Medicine

## 2015-01-11 ENCOUNTER — Telehealth: Payer: Self-pay | Admitting: Internal Medicine

## 2015-01-11 NOTE — Telephone Encounter (Signed)
Spoke to patient regarding my conversation with Dr. Clydene Laming at [facility] I think is reasonable to proceed with stem cell collection; for a possible future transplant if needed. Recommend that he calls up Ms. Print production planner at [employer]. With regards to starting on Revlimid- would depend upon plan for stem cell collection. Patient will call us and inform us regarding the next step; and start Revlimid accordingly.

## 2015-02-16 ENCOUNTER — Other Ambulatory Visit: Payer: Self-pay | Admitting: *Deleted

## 2015-02-16 DIAGNOSIS — C9001 Multiple myeloma in remission: Secondary | ICD-10-CM

## 2015-02-19 ENCOUNTER — Inpatient Hospital Stay: Payer: Medicare Other | Attending: Internal Medicine

## 2015-02-19 ENCOUNTER — Inpatient Hospital Stay (HOSPITAL_BASED_OUTPATIENT_CLINIC_OR_DEPARTMENT_OTHER): Payer: Medicare Other | Admitting: Internal Medicine

## 2015-02-19 ENCOUNTER — Encounter: Payer: Self-pay | Admitting: Internal Medicine

## 2015-02-19 VITALS — BP 166/88 | HR 56 | Temp 96.9°F | Resp 18 | Ht 70.0 in | Wt 242.5 lb

## 2015-02-19 DIAGNOSIS — K219 Gastro-esophageal reflux disease without esophagitis: Secondary | ICD-10-CM | POA: Diagnosis not present

## 2015-02-19 DIAGNOSIS — E669 Obesity, unspecified: Secondary | ICD-10-CM | POA: Insufficient documentation

## 2015-02-19 DIAGNOSIS — E079 Disorder of thyroid, unspecified: Secondary | ICD-10-CM | POA: Diagnosis not present

## 2015-02-19 DIAGNOSIS — N189 Chronic kidney disease, unspecified: Secondary | ICD-10-CM | POA: Insufficient documentation

## 2015-02-19 DIAGNOSIS — E1122 Type 2 diabetes mellitus with diabetic chronic kidney disease: Secondary | ICD-10-CM | POA: Insufficient documentation

## 2015-02-19 DIAGNOSIS — I129 Hypertensive chronic kidney disease with stage 1 through stage 4 chronic kidney disease, or unspecified chronic kidney disease: Secondary | ICD-10-CM | POA: Diagnosis not present

## 2015-02-19 DIAGNOSIS — E785 Hyperlipidemia, unspecified: Secondary | ICD-10-CM | POA: Diagnosis not present

## 2015-02-19 DIAGNOSIS — Z79899 Other long term (current) drug therapy: Secondary | ICD-10-CM | POA: Insufficient documentation

## 2015-02-19 DIAGNOSIS — Z7982 Long term (current) use of aspirin: Secondary | ICD-10-CM | POA: Insufficient documentation

## 2015-02-19 DIAGNOSIS — Z9221 Personal history of antineoplastic chemotherapy: Secondary | ICD-10-CM | POA: Diagnosis not present

## 2015-02-19 DIAGNOSIS — Z85850 Personal history of malignant neoplasm of thyroid: Secondary | ICD-10-CM | POA: Insufficient documentation

## 2015-02-19 DIAGNOSIS — C9001 Multiple myeloma in remission: Secondary | ICD-10-CM

## 2015-02-19 DIAGNOSIS — C9000 Multiple myeloma not having achieved remission: Secondary | ICD-10-CM

## 2015-02-19 LAB — CBC WITH DIFFERENTIAL/PLATELET
Basophils Absolute: 0 10*3/uL (ref 0–0.1)
Basophils Relative: 1 %
EOS ABS: 0.1 10*3/uL (ref 0–0.7)
Eosinophils Relative: 3 %
HEMATOCRIT: 38.4 % — AB (ref 40.0–52.0)
HEMOGLOBIN: 12.7 g/dL — AB (ref 13.0–18.0)
Lymphocytes Relative: 28 %
Lymphs Abs: 1 10*3/uL (ref 1.0–3.6)
MCH: 31.6 pg (ref 26.0–34.0)
MCHC: 33 g/dL (ref 32.0–36.0)
MCV: 95.9 fL (ref 80.0–100.0)
Monocytes Absolute: 0.4 10*3/uL (ref 0.2–1.0)
Monocytes Relative: 11 %
Neutro Abs: 2 10*3/uL (ref 1.4–6.5)
Neutrophils Relative %: 57 %
Platelets: 209 10*3/uL (ref 150–440)
RBC: 4 MIL/uL — AB (ref 4.40–5.90)
RDW: 15.9 % — ABNORMAL HIGH (ref 11.5–14.5)
WBC: 3.6 10*3/uL — AB (ref 3.8–10.6)

## 2015-02-19 NOTE — Progress Notes (Signed)
[facility] OFFICE PROGRESS NOTE  Patient Care Team: Madelyn Brunner, MD as PCP - General (Internal Medicine)   SUMMARY OF ONCOLOGIC HISTORY:  # April 2014- MULTIPLE MYELOMA [IgG- 2.2gm/dl; 40-50% plasma cell; Cyto-N; FISH- gain of chr.9, 15 & ccnd1/11q13] s/p RVD x6 [sep 2014; BMBx- ~5% plasma cells]; Maint Rev-DEX-Zometa; March 2015-Stopped Dex Cont- Rev-Zometa; July 2016- Rev 32m; NOV 4th  2016-HOLD  # CKD [creat ~1.4; sec MM]; DM  INTERVAL HISTORY:  A pleasant  70year old male patient with above history of multiple myeloma currently on maintenance Revlimid is here for follow-up. Patient Zometa have to be held  in July because of slightly elevated creatinine at 1.7; patient baseline Is around 1.3-1.4. Revlimid has been on hold  Since November 2016.   In the interim patient had bilateral cataracts done-  Significantly improved vision.   Patient has  Decided not to go for stem cell transplant.  However he is still undecided if he wants to have his stem cells harvested.  At the same time he continues to hold off Revlimid.  Patient denies any abdominal pain denies any diarrhea. Denies any skin rash. Denies any bone pain. Appetite is good. He denies any chest pain or shortness of breath or cough. Denies any swelling in the legs. No tingling or numbness.  REVIEW OF SYSTEMS:  A complete 10 point review of system is done which is negative except mentioned above/history of present illness.   PAST MEDICAL HISTORY :  Past Medical History  Diagnosis Date  . Multiple myeloma ([place]   . Diabetes mellitus without complication (HM[place]   . Hypertension   . Thyroid disease   . GERD (gastroesophageal reflux disease)   . Hyperlipidemia   . Obesity   . Thyroid cancer ([place]     PAST SURGICAL HISTORY :   Past Surgical History  Procedure Laterality Date  . Bone marrow biopsy  2014    FAMILY HISTORY :   Family History  Problem Relation Age of Onset  . Cancer Father 632 .  Diabetes Mother     SOCIAL HISTORY:   Social History  Substance Use Topics  . Smoking status: Never Smoker   . Smokeless tobacco: Never Used  . Alcohol Use: No    ALLERGIES:  has No Known Allergies.  MEDICATIONS:  Current Outpatient Prescriptions  Medication Sig Dispense Refill  . amLODipine (NORVASC) 10 MG tablet Take 10 mg by mouth daily.    .Marland Kitchenaspirin EC 81 MG tablet Take 81 mg by mouth daily.    .Marland Kitchenatorvastatin (LIPITOR) 20 MG tablet Take 20 mg by mouth daily.    . cloNIDine (CATAPRES) 0.1 MG tablet Take 0.1 mg by mouth 2 (two) times daily.    . Cyanocobalamin (RA VITAMIN B-12 TR) 1000 MCG TBCR Take by mouth.    . levothyroxine (SYNTHROID) 200 MCG tablet Take 1 tablet by mouth daily.    .Marland Kitchenlosartan (COZAAR) 100 MG tablet Take 100 mg by mouth daily.    .Glory RosebushDELICA LANCETS 307PMISC Inject 1 Lancet as directed once daily.    . pioglitazone (ACTOS) 30 MG tablet Take 30 mg by mouth daily.    . potassium chloride SA (K-DUR,KLOR-CON) 20 MEQ tablet   0  . quinapril (ACCUPRIL) 40 MG tablet Take 40 mg by mouth daily.    . sennosides-docusate sodium (SENOKOT-S) 8.6-50 MG tablet Take 2 tablets by mouth daily.    .Marland Kitchenlenalidomide (REVLIMID) 15 MG capsule Daily for 3  weeks and then 1 week off (Patient not taking: Reported on 02/19/2015) 21 capsule 4   No current facility-administered medications for this visit.   Facility-Administered Medications Ordered in Other Visits  Medication Dose Route Frequency Provider Last Rate Last Dose  . 0.9 %  sodium chloride infusion   Intravenous Continuous Cammie Sickle, MD   Stopped at 12/11/14 1528    PHYSICAL EXAMINATION: ECOG PERFORMANCE STATUS: 0 - Asymptomatic  BP 166/88 mmHg  Pulse 56  Temp(Src) 96.9 F (36.1 C) (Tympanic)  Resp 18  Ht '5\' 10"'  (1.778 m)  Wt 242 lb 8.1 oz (110 kg)  BMI 34.80 kg/m2  SpO2 99%  Filed Weights   02/19/15 1515  Weight: 242 lb 8.1 oz (110 kg)    GENERAL: Well-nourished well-developed; Alert, no  distress and comfortable. Accompanied by his wife. No pallor or icterus OROPHARYNX: no thrush or ulceration; good dentition  NECK: supple, no masses felt LYMPH:  no palpable lymphadenopathy in the cervical, axillary or inguinal regions LUNGS: clear to auscultation and  No wheeze or crackles HEART/CVS: regular rate & rhythm and no murmurs; No lower extremity edema ABDOMEN:abdomen soft, non-tender and normal bowel sounds Musculoskeletal:no cyanosis of digits and no clubbing  PSYCH: alert & oriented x 3 with fluent speech NEURO: no focal motor/sensory deficits SKIN:  no rashes or significant lesions  LABORATORY DATA:  I have reviewed the data as listed    Component Value Date/Time   NA 139 01/08/2015 1438   NA 139 05/01/2014 1322   K 4.2 01/08/2015 1438   K 4.4 05/01/2014 1322   CL 106 01/08/2015 1438   CL 107 05/01/2014 1322   CO2 27 01/08/2015 1438   CO2 27 05/01/2014 1322   GLUCOSE 100* 01/08/2015 1438   GLUCOSE 102* 05/01/2014 1322   BUN 23* 01/08/2015 1438   BUN 16 05/01/2014 1322   CREATININE 1.44* 01/08/2015 1438   CREATININE 1.48* 05/29/2014 0949   CALCIUM 9.0 01/08/2015 1438   CALCIUM 9.1 05/01/2014 1322   PROT 7.7 01/08/2015 1438   PROT 7.5 05/01/2014 1322   ALBUMIN 3.7 01/08/2015 1438   ALBUMIN 3.6 05/01/2014 1322   AST 17 01/08/2015 1438   AST 17 05/01/2014 1322   ALT 12* 01/08/2015 1438   ALT 13* 05/01/2014 1322   ALKPHOS 56 01/08/2015 1438   ALKPHOS 47 05/01/2014 1322   BILITOT 0.7 01/08/2015 1438   BILITOT 0.6 05/01/2014 1322   GFRNONAA 48* 01/08/2015 1438   GFRNONAA 47* 05/29/2014 0949   GFRNONAA 56* 03/10/2014 0946   GFRAA 55* 01/08/2015 1438   GFRAA 55* 05/29/2014 0949   GFRAA >60 03/10/2014 0946    No results found for: SPEP, UPEP  Lab Results  Component Value Date   WBC 3.6* 02/19/2015   NEUTROABS 2.0 02/19/2015   HGB 12.7* 02/19/2015   HCT 38.4* 02/19/2015   MCV 95.9 02/19/2015   PLT 209 02/19/2015      Chemistry      Component  Value Date/Time   NA 139 01/08/2015 1438   NA 139 05/01/2014 1322   K 4.2 01/08/2015 1438   K 4.4 05/01/2014 1322   CL 106 01/08/2015 1438   CL 107 05/01/2014 1322   CO2 27 01/08/2015 1438   CO2 27 05/01/2014 1322   BUN 23* 01/08/2015 1438   BUN 16 05/01/2014 1322   CREATININE 1.44* 01/08/2015 1438   CREATININE 1.48* 05/29/2014 0949      Component Value Date/Time   CALCIUM 9.0 01/08/2015 1438  CALCIUM 9.1 05/01/2014 1322   ALKPHOS 56 01/08/2015 1438   ALKPHOS 47 05/01/2014 1322   AST 17 01/08/2015 1438   AST 17 05/01/2014 1322   ALT 12* 01/08/2015 1438   ALT 13* 05/01/2014 1322   BILITOT 0.7 01/08/2015 1438   BILITOT 0.6 05/01/2014 1322       RADIOGRAPHIC STUDIES: I have personally reviewed the radiological images as listed and agreed with the findings in the report. No results found.   ASSESSMENT & PLAN:  Multiple myeloma currently on maintenance Revlimid 64m 3 w-On & 1 w Off; at least since September 2014. Patient tolerating maintenance Revlimid very well. Patient's most recent serum M protein October 2016- negative; serum free kappa/lambda light chain ratio slightly abnormal at 1.67.   #  Revlimid is on hold since November 2016. [facility]  However,  He is not interested in immediate autologous symptoms of transplant.  I discussed with him multiple times regarding stem cell harvest  With  Possible future use.  He still is undecided.  I have asked him to call the BMT transplant coordinator  Talk about stem cell harvest ONLY.  He plans to call the BMT transplant coordinator.  I have also asked them to give me the number for the transplant coordinator  So that I can personally talked to the coordinator Re: Issues.  #  Continue to hold  Revlimid for now.   # Creatinine is 1.4- on  Zometa q 3 months;  He will be due next month.  # 25 minutes face-to-face with the patient discussing the above plan of care; more than 50% of time spent  on prognosis/ natural history; counseling and coordination.     GCammie Sickle MD 02/19/2015 3:27 PM

## 2015-02-22 LAB — COMPREHENSIVE METABOLIC PANEL
ALBUMIN: 3.7 g/dL (ref 3.5–5.0)
ALT: 12 U/L — ABNORMAL LOW (ref 17–63)
ANION GAP: 6 (ref 5–15)
AST: 14 U/L — AB (ref 15–41)
Alkaline Phosphatase: 45 U/L (ref 38–126)
BILIRUBIN TOTAL: 0.2 mg/dL — AB (ref 0.3–1.2)
BUN: 20 mg/dL (ref 6–20)
CHLORIDE: 108 mmol/L (ref 101–111)
CO2: 26 mmol/L (ref 22–32)
Calcium: 9 mg/dL (ref 8.9–10.3)
Creatinine, Ser: 1.37 mg/dL — ABNORMAL HIGH (ref 0.61–1.24)
GFR calc Af Amer: 58 mL/min — ABNORMAL LOW (ref >60)
GFR calc non Af Amer: 50 mL/min — ABNORMAL LOW (ref >60)
GLUCOSE: 109 mg/dL — AB (ref 65–99)
POTASSIUM: 4.1 mmol/L (ref 3.5–5.1)
Sodium: 140 mmol/L (ref 135–145)
TOTAL PROTEIN: 7.5 g/dL (ref 6.5–8.1)

## 2015-02-24 ENCOUNTER — Encounter: Payer: Self-pay | Admitting: Internal Medicine

## 2015-02-24 ENCOUNTER — Telehealth: Payer: Self-pay | Admitting: *Deleted

## 2015-02-24 NOTE — Progress Notes (Signed)
Spoke to bone marrow transplant coordinator at [facility]- patient agreeable to stem cell harvest. Patient will continue to hold Revlimid for now.

## 2015-02-24 NOTE — Telephone Encounter (Signed)
Nurse Cordinator at [facility] BMT is Jodi Geralds phone numbers are [phone] or [phone]. He did call her

## 2015-02-24 NOTE — Telephone Encounter (Signed)
Called patient. Pt not at residence. Spoke with pt's wife. Explained that Dr. Tish Men spoke with the [facility] transplant coordinator today. Pt agreeable to proceed with the stem cell harvest. Pt's wife instructed to have patient continue to hold the revlimid until further notice.  Teach back process performed.

## 2015-03-22 ENCOUNTER — Inpatient Hospital Stay: Payer: Medicare Other

## 2015-03-22 ENCOUNTER — Inpatient Hospital Stay: Payer: Medicare Other | Admitting: Internal Medicine

## 2015-03-22 ENCOUNTER — Encounter: Payer: Self-pay | Admitting: *Deleted

## 2015-03-22 NOTE — Progress Notes (Signed)
Patient no show today 03/22/15 for lab/md/zometa. msg sent to cancer center scheduling to reschedule this appointment.

## 2015-03-26 ENCOUNTER — Inpatient Hospital Stay: Payer: Medicare Other

## 2015-03-26 ENCOUNTER — Inpatient Hospital Stay (HOSPITAL_BASED_OUTPATIENT_CLINIC_OR_DEPARTMENT_OTHER): Payer: Medicare Other | Admitting: Internal Medicine

## 2015-03-26 ENCOUNTER — Inpatient Hospital Stay: Payer: Medicare Other | Attending: Internal Medicine

## 2015-03-26 VITALS — BP 159/76 | HR 72 | Temp 99.1°F | Ht 70.5 in | Wt 242.5 lb

## 2015-03-26 DIAGNOSIS — E785 Hyperlipidemia, unspecified: Secondary | ICD-10-CM

## 2015-03-26 DIAGNOSIS — Z79899 Other long term (current) drug therapy: Secondary | ICD-10-CM

## 2015-03-26 DIAGNOSIS — I10 Essential (primary) hypertension: Secondary | ICD-10-CM | POA: Diagnosis not present

## 2015-03-26 DIAGNOSIS — Z85850 Personal history of malignant neoplasm of thyroid: Secondary | ICD-10-CM

## 2015-03-26 DIAGNOSIS — E079 Disorder of thyroid, unspecified: Secondary | ICD-10-CM

## 2015-03-26 DIAGNOSIS — Z7982 Long term (current) use of aspirin: Secondary | ICD-10-CM | POA: Diagnosis not present

## 2015-03-26 DIAGNOSIS — K219 Gastro-esophageal reflux disease without esophagitis: Secondary | ICD-10-CM | POA: Insufficient documentation

## 2015-03-26 DIAGNOSIS — E669 Obesity, unspecified: Secondary | ICD-10-CM | POA: Insufficient documentation

## 2015-03-26 DIAGNOSIS — C9000 Multiple myeloma not having achieved remission: Secondary | ICD-10-CM | POA: Diagnosis not present

## 2015-03-26 DIAGNOSIS — E119 Type 2 diabetes mellitus without complications: Secondary | ICD-10-CM

## 2015-03-26 DIAGNOSIS — C9001 Multiple myeloma in remission: Secondary | ICD-10-CM

## 2015-03-26 LAB — COMPREHENSIVE METABOLIC PANEL
ALT: 12 U/L — AB (ref 17–63)
ANION GAP: 4 — AB (ref 5–15)
AST: 16 U/L (ref 15–41)
Albumin: 3.7 g/dL (ref 3.5–5.0)
Alkaline Phosphatase: 48 U/L (ref 38–126)
BUN: 21 mg/dL — ABNORMAL HIGH (ref 6–20)
CALCIUM: 8.9 mg/dL (ref 8.9–10.3)
CHLORIDE: 106 mmol/L (ref 101–111)
CO2: 26 mmol/L (ref 22–32)
CREATININE: 1.48 mg/dL — AB (ref 0.61–1.24)
GFR, EST AFRICAN AMERICAN: 53 mL/min — AB (ref >60)
GFR, EST NON AFRICAN AMERICAN: 46 mL/min — AB (ref >60)
Glucose, Bld: 116 mg/dL — ABNORMAL HIGH (ref 65–99)
Potassium: 4.3 mmol/L (ref 3.5–5.1)
SODIUM: 136 mmol/L (ref 135–145)
Total Bilirubin: 0.5 mg/dL (ref 0.3–1.2)
Total Protein: 7.8 g/dL (ref 6.5–8.1)

## 2015-03-26 LAB — CBC WITH DIFFERENTIAL/PLATELET
BASOS ABS: 0 10*3/uL (ref 0–0.1)
Basophils Relative: 1 %
EOS PCT: 3 %
Eosinophils Absolute: 0.1 10*3/uL (ref 0–0.7)
HCT: 37 % — ABNORMAL LOW (ref 40.0–52.0)
Hemoglobin: 12.5 g/dL — ABNORMAL LOW (ref 13.0–18.0)
LYMPHS PCT: 17 %
Lymphs Abs: 0.6 10*3/uL — ABNORMAL LOW (ref 1.0–3.6)
MCH: 31.9 pg (ref 26.0–34.0)
MCHC: 33.7 g/dL (ref 32.0–36.0)
MCV: 94.8 fL (ref 80.0–100.0)
MONO ABS: 0.7 10*3/uL (ref 0.2–1.0)
MONOS PCT: 21 %
NEUTROS ABS: 2.1 10*3/uL (ref 1.4–6.5)
Neutrophils Relative %: 58 %
PLATELETS: 206 10*3/uL (ref 150–440)
RBC: 3.9 MIL/uL — ABNORMAL LOW (ref 4.40–5.90)
RDW: 14.6 % — ABNORMAL HIGH (ref 11.5–14.5)
WBC: 3.6 10*3/uL — ABNORMAL LOW (ref 3.8–10.6)

## 2015-03-26 MED ORDER — LENALIDOMIDE 15 MG PO CAPS: ORAL_CAPSULE | ORAL | Status: DC

## 2015-03-26 MED ORDER — SODIUM CHLORIDE 0.9 % IV SOLN
INTRAVENOUS | Status: DC
  Administered 2015-03-26: 14:00:00 via INTRAVENOUS
  Filled 2015-03-26: qty 1000

## 2015-03-26 MED ORDER — ZOLEDRONIC ACID 4 MG/100ML IV SOLN
4.0000 mg | Freq: Once | INTRAVENOUS | Status: AC
  Administered 2015-03-26: 4 mg via INTRAVENOUS
  Filled 2015-03-26: qty 100

## 2015-03-26 NOTE — Progress Notes (Signed)
Patient brought to exam room 4, patient denies pain or discomfort.  Med rec updated, information provided by patient.

## 2015-03-26 NOTE — Progress Notes (Signed)
[place] OFFICE PROGRESS NOTE  Patient Care Team: Madelyn Brunner, MD as PCP - General (Internal Medicine)   SUMMARY OF ONCOLOGIC HISTORY:  # April 2014- MULTIPLE MYELOMA [IgG- 2.2gm/dl; 40-50% plasma cell; Cyto-N; FISH- gain of chr.9, 15 & ccnd1/11q13] s/p RVD x6 [sep 2014; BMBx- ~5% plasma cells]; Maint Rev-DEX-Zometa; March 2015-Stopped Dex Cont- Rev-Zometa; July 2016- Rev 92m; NOV 4th  2016-HOLD  # CKD [creat ~1.4; sec MM]; DM  INTERVAL HISTORY:  A pleasant  70year old male patient with above history of multiple myeloma currently on maintenance Revlimid is here for follow-up. Patient Zometa have to be held  in July because of slightly elevated creatinine at 1.7; patient baseline Is around 1.3-1.4. Revlimid has been on hold  Since November 2016.  Patient states that his bone marrow harvest has been canceled- because he is told not to be a candidate. He is here to restart his Revlimid.  Patient denies any abdominal pain denies any diarrhea. Denies any skin rash. Denies any bone pain. Appetite is good. He denies any chest pain or shortness of breath or cough. Denies any swelling in the legs. No tingling or numbness.  REVIEW OF SYSTEMS:  A complete 10 point review of system is done which is negative except mentioned above/history of present illness.   PAST MEDICAL HISTORY :  Past Medical History  Diagnosis Date  . Multiple myeloma ([place]   . Diabetes mellitus without complication ([place]   . Hypertension   . Thyroid disease   . GERD (gastroesophageal reflux disease)   . Hyperlipidemia   . Obesity   . Thyroid cancer ([place]     PAST SURGICAL HISTORY :   Past Surgical History  Procedure Laterality Date  . Bone marrow biopsy  2014  . Cataract extraction bilateral w/ anterior vitrectomy      FAMILY HISTORY :   Family History  Problem Relation Age of Onset  . Cancer Father 647 . Diabetes Mother     SOCIAL HISTORY:   Social History  Substance Use Topics   . Smoking status: Never Smoker   . Smokeless tobacco: Never Used  . Alcohol Use: No    ALLERGIES:  has No Known Allergies.  MEDICATIONS:  Current Outpatient Prescriptions  Medication Sig Dispense Refill  . acyclovir (ZOVIRAX) 400 MG tablet 400 mg 2 (two) times daily.    .Marland KitchenamLODipine (NORVASC) 10 MG tablet Take 10 mg by mouth daily.    .Marland Kitchenaspirin EC 81 MG tablet Take 81 mg by mouth daily.    .Marland Kitchenatorvastatin (LIPITOR) 20 MG tablet Take 20 mg by mouth daily.    . cloNIDine (CATAPRES) 0.1 MG tablet Take 0.1 mg by mouth 2 (two) times daily.    . Cyanocobalamin (RA VITAMIN B-12 TR) 1000 MCG TBCR Take by mouth.    . levothyroxine (SYNTHROID) 200 MCG tablet Take 1 tablet by mouth daily.    .Marland Kitchenlosartan (COZAAR) 100 MG tablet Take 100 mg by mouth daily.    .Glory RosebushDELICA LANCETS 318AMISC Inject 1 Lancet as directed once daily.    . pioglitazone (ACTOS) 30 MG tablet Take 30 mg by mouth daily.    . potassium chloride SA (K-DUR,KLOR-CON) 20 MEQ tablet   0  . quinapril (ACCUPRIL) 40 MG tablet Take 40 mg by mouth daily.    . sennosides-docusate sodium (SENOKOT-S) 8.6-50 MG tablet Take 2 tablets by mouth daily.    .Marland Kitchenlenalidomide (REVLIMID) 15 MG capsule Daily for 3 weeks  and then 1 week off (Patient not taking: Reported on 02/19/2015) 21 capsule 4   No current facility-administered medications for this visit.   Facility-Administered Medications Ordered in Other Visits  Medication Dose Route Frequency Provider Last Rate Last Dose  . 0.9 %  sodium chloride infusion   Intravenous Continuous Cammie Sickle, MD   Stopped at 12/11/14 1528    PHYSICAL EXAMINATION: ECOG PERFORMANCE STATUS: 0 - Asymptomatic  BP 159/76 mmHg  Pulse 72  Temp(Src) 99.1 F (37.3 C) (Tympanic)  Ht 5' 10.5" (1.791 m)  Wt 242 lb 8.1 oz (110 kg)  BMI 34.29 kg/m2  SpO2 100%  Filed Weights   03/26/15 1343  Weight: 242 lb 8.1 oz (110 kg)    GENERAL: Well-nourished well-developed; Alert, no distress and  comfortable. He is alone.  No pallor or icterus OROPHARYNX: no thrush or ulceration; good dentition  NECK: supple, no masses felt LYMPH:  no palpable lymphadenopathy in the cervical, axillary or inguinal regions LUNGS: clear to auscultation and  No wheeze or crackles HEART/CVS: regular rate & rhythm and no murmurs; No lower extremity edema ABDOMEN:abdomen soft, non-tender and normal bowel sounds Musculoskeletal:no cyanosis of digits and no clubbing  PSYCH: alert & oriented x 3 with fluent speech NEURO: no focal motor/sensory deficits SKIN:  no rashes or significant lesions  LABORATORY DATA:  I have reviewed the data as listed    Component Value Date/Time   NA 136 03/26/2015 1314   NA 139 05/01/2014 1322   K 4.3 03/26/2015 1314   K 4.4 05/01/2014 1322   CL 106 03/26/2015 1314   CL 107 05/01/2014 1322   CO2 26 03/26/2015 1314   CO2 27 05/01/2014 1322   GLUCOSE 116* 03/26/2015 1314   GLUCOSE 102* 05/01/2014 1322   BUN 21* 03/26/2015 1314   BUN 16 05/01/2014 1322   CREATININE 1.48* 03/26/2015 1314   CREATININE 1.48* 05/29/2014 0949   CALCIUM 8.9 03/26/2015 1314   CALCIUM 9.1 05/01/2014 1322   PROT 7.8 03/26/2015 1314   PROT 7.5 05/01/2014 1322   ALBUMIN 3.7 03/26/2015 1314   ALBUMIN 3.6 05/01/2014 1322   AST 16 03/26/2015 1314   AST 17 05/01/2014 1322   ALT 12* 03/26/2015 1314   ALT 13* 05/01/2014 1322   ALKPHOS 48 03/26/2015 1314   ALKPHOS 47 05/01/2014 1322   BILITOT 0.5 03/26/2015 1314   BILITOT 0.6 05/01/2014 1322   GFRNONAA 46* 03/26/2015 1314   GFRNONAA 47* 05/29/2014 0949   GFRNONAA 56* 03/10/2014 0946   GFRAA 53* 03/26/2015 1314   GFRAA 55* 05/29/2014 0949   GFRAA >60 03/10/2014 0946    No results found for: SPEP, UPEP  Lab Results  Component Value Date   WBC 3.6* 03/26/2015   NEUTROABS 2.1 03/26/2015   HGB 12.5* 03/26/2015   HCT 37.0* 03/26/2015   MCV 94.8 03/26/2015   PLT 206 03/26/2015      Chemistry      Component Value Date/Time   NA 136  03/26/2015 1314   NA 139 05/01/2014 1322   K 4.3 03/26/2015 1314   K 4.4 05/01/2014 1322   CL 106 03/26/2015 1314   CL 107 05/01/2014 1322   CO2 26 03/26/2015 1314   CO2 27 05/01/2014 1322   BUN 21* 03/26/2015 1314   BUN 16 05/01/2014 1322   CREATININE 1.48* 03/26/2015 1314   CREATININE 1.48* 05/29/2014 0949      Component Value Date/Time   CALCIUM 8.9 03/26/2015 1314   CALCIUM 9.1 05/01/2014  1322   ALKPHOS 48 03/26/2015 1314   ALKPHOS 47 05/01/2014 1322   AST 16 03/26/2015 1314   AST 17 05/01/2014 1322   ALT 12* 03/26/2015 1314   ALT 13* 05/01/2014 1322   BILITOT 0.5 03/26/2015 1314   BILITOT 0.6 05/01/2014 1322        ASSESSMENT & PLAN:  Multiple myeloma currently on maintenance Revlimid 21m 3 w-On & 1 w Off; at least since September 2014. Patient tolerating maintenance Revlimid very well. Patient's most recent serum M protein October 2016- negative; serum free kappa/lambda light chain ratio slightly abnormal at 1.67.  Myeloma work up is pending from today.  # restart Revlimid 25 mg 3 weeks on and one-week off. Patient will get CBC/CMP on a monthly basis.  # [facility] the reason for his cancellation of systems harvest  # Creatinine is 1.48- on  Zometa q 3 months;  He will be due today.   # We'll follow up with me in 3 months/CBC CMP/ Zometa.   # 25 minutes face-to-face with the patient discussing the above plan of care; more than 50% of time spent on prognosis/ natural history; counseling and coordination.     GCammie Sickle MD 03/26/2015 1:54 PM

## 2015-03-29 LAB — KAPPA/LAMBDA LIGHT CHAINS
KAPPA, LAMDA LIGHT CHAIN RATIO: 1.48 (ref 0.26–1.65)
Kappa free light chain: 42.28 mg/L — ABNORMAL HIGH (ref 3.30–19.40)
LAMDA FREE LIGHT CHAINS: 28.5 mg/L — AB (ref 5.71–26.30)

## 2015-03-29 LAB — IMMUNOFIXATION ELECTROPHORESIS
IgA: 414 mg/dL (ref 61–437)
IgG (Immunoglobin G), Serum: 1621 mg/dL — ABNORMAL HIGH (ref 700–1600)
IgM, Serum: 43 mg/dL (ref 15–143)
Total Protein ELP: 6.9 g/dL (ref 6.0–8.5)

## 2015-03-30 ENCOUNTER — Telehealth: Payer: Self-pay | Admitting: Internal Medicine

## 2015-03-30 LAB — MULTIPLE MYELOMA PANEL, SERUM
ALBUMIN/GLOB SERPL: 1.1 (ref 0.7–1.7)
Albumin SerPl Elph-Mcnc: 3.6 g/dL (ref 2.9–4.4)
Alpha 1: 0.2 g/dL (ref 0.0–0.4)
Alpha2 Glob SerPl Elph-Mcnc: 0.7 g/dL (ref 0.4–1.0)
B-Globulin SerPl Elph-Mcnc: 1.2 g/dL (ref 0.7–1.3)
Gamma Glob SerPl Elph-Mcnc: 1.5 g/dL (ref 0.4–1.8)
Globulin, Total: 3.5 g/dL (ref 2.2–3.9)
IGA: 418 mg/dL (ref 61–437)
IGG (IMMUNOGLOBIN G), SERUM: 1658 mg/dL — AB (ref 700–1600)
IGM, SERUM: 45 mg/dL (ref 15–143)
TOTAL PROTEIN ELP: 7.1 g/dL (ref 6.0–8.5)

## 2015-03-30 NOTE — Telephone Encounter (Signed)
Spoke to Dr.Woods from [facility]- BMT; overall given pts reluctance to proceed with transplant in future/which would be again contingent upon his medical issues/age [pt currently 69 years]; it was decided to hold off stem cell harvest. Pt will continue revlimid- as planned.

## 2015-03-31 ENCOUNTER — Telehealth: Payer: Self-pay | Admitting: *Deleted

## 2015-03-31 NOTE — Telephone Encounter (Signed)
Alan Alexander at [employer] contacted cancer center. Pt's copay for the Revlimid is $3011/month. [employer] unable to contact pt. Pt previously had copay assistance however, this foundation funds have run out and can no longer help this patient. She is able to go through the leukemia/lymphoma society or the Revlmid. Alan Alexander is trying to reach the patient and his vm msg is full. Alan Alexander can fax the application for the society foundation to [phone] as soon as possible.  I left a vm at pt's home phone. Unable to reach pt via cell phone as vm is not set up.  Dr. Rogue Bussing is aware.

## 2015-04-01 NOTE — Telephone Encounter (Signed)
Pt returned call. He will come over today and sign the forms and bring documentation of his tax return from 2016 and pt insurance information.

## 2015-04-02 NOTE — Telephone Encounter (Signed)
Forms completed on 04/01/15 with patient and faxed to [employer]. The patient states that he has not yet completed his 2016 tax returns and he will do so in the next few weeks. He was only able to provide the 2015 tax return. I notified [employer] via fax that pt unable to supply 2016 tax returns. I explained to patient that this may delay his application with the cl foundations.  I left a msg for our cancer center pharmacist to see if we can get medications samples shipped to cancer center to dispense to patient. Pending pharmacy response.

## 2015-04-09 ENCOUNTER — Telehealth: Payer: Self-pay | Admitting: *Deleted

## 2015-04-09 NOTE — Telephone Encounter (Signed)
Called pt and asked him his financial info to try to get another copay asst with pt advocate foundation.  He told me round about number [phone] per year.  Gave info to [place] who is helping me with the on line form.  Barnabas Lister completed the part we can do and it will send email to pt. And I have asked pt to call me if  He gets email and if I need to help him answer a few questions to see if it qualifies him.  In the time of this note I also recvd a form from leukemia and [place] asking [place] to  [facility] out one section that was missed about the date of diagnosis and it was filled in and sent back to leukemia ns [place]

## 2015-04-16 ENCOUNTER — Telehealth: Payer: Self-pay | Admitting: *Deleted

## 2015-04-16 NOTE — Telephone Encounter (Signed)
Pt ha called and then came by to show me the paper work about copay foundation through [place] [employer] [place].  In between filling out paper and faxing it in. We were told it would take 4-6 weeks and in between pt had got another set of papers for the same thing again.  We had got a call from [place] in pharmacy that they was another copay that opened up with pt advocate foundation.  We applied there but never heard anything.  When the pt came in he said he was approved from somewhere and he got med already.  Barnabas Lister called and ws told by Doroteo Bradford that pt was approved with pt advocate foundation and his drug is covered. That means we do not have to cont. The process with [facility]LS.  Pt contacted and let him know that he is covered through another foundation. We do not need to resend info to LLS.

## 2015-04-23 ENCOUNTER — Inpatient Hospital Stay: Payer: Medicare Other | Attending: Internal Medicine

## 2015-04-23 DIAGNOSIS — C9000 Multiple myeloma not having achieved remission: Secondary | ICD-10-CM

## 2015-04-23 LAB — CBC WITH DIFFERENTIAL/PLATELET
BASOS ABS: 0 10*3/uL (ref 0–0.1)
BASOS PCT: 1 %
EOS ABS: 0.1 10*3/uL (ref 0–0.7)
EOS PCT: 4 %
HCT: 37.6 % — ABNORMAL LOW (ref 40.0–52.0)
Hemoglobin: 12.7 g/dL — ABNORMAL LOW (ref 13.0–18.0)
LYMPHS PCT: 34 %
Lymphs Abs: 1.1 10*3/uL (ref 1.0–3.6)
MCH: 31.9 pg (ref 26.0–34.0)
MCHC: 33.9 g/dL (ref 32.0–36.0)
MCV: 93.9 fL (ref 80.0–100.0)
MONO ABS: 0.5 10*3/uL (ref 0.2–1.0)
Monocytes Relative: 16 %
Neutro Abs: 1.4 10*3/uL (ref 1.4–6.5)
Neutrophils Relative %: 45 %
PLATELETS: 181 10*3/uL (ref 150–440)
RBC: 4 MIL/uL — AB (ref 4.40–5.90)
RDW: 14.1 % (ref 11.5–14.5)
WBC: 3.2 10*3/uL — AB (ref 3.8–10.6)

## 2015-04-23 LAB — COMPREHENSIVE METABOLIC PANEL WITH GFR
ALT: 12 U/L — ABNORMAL LOW (ref 17–63)
AST: 16 U/L (ref 15–41)
Albumin: 3.6 g/dL (ref 3.5–5.0)
Alkaline Phosphatase: 49 U/L (ref 38–126)
Anion gap: 4 — ABNORMAL LOW (ref 5–15)
BUN: 17 mg/dL (ref 6–20)
CO2: 27 mmol/L (ref 22–32)
Calcium: 8.4 mg/dL — ABNORMAL LOW (ref 8.9–10.3)
Chloride: 107 mmol/L (ref 101–111)
Creatinine, Ser: 1.24 mg/dL (ref 0.61–1.24)
GFR calc Af Amer: >60 mL/min (ref >60)
GFR calc non Af Amer: 57 mL/min — ABNORMAL LOW (ref >60)
Glucose, Bld: 130 mg/dL — ABNORMAL HIGH (ref 65–99)
Potassium: 3.8 mmol/L (ref 3.5–5.1)
Sodium: 138 mmol/L (ref 135–145)
Total Bilirubin: 0.5 mg/dL (ref 0.3–1.2)
Total Protein: 7.5 g/dL (ref 6.5–8.1)

## 2015-05-21 ENCOUNTER — Telehealth: Payer: Self-pay | Admitting: *Deleted

## 2015-05-21 ENCOUNTER — Inpatient Hospital Stay: Payer: Medicare Other | Attending: Internal Medicine

## 2015-05-21 DIAGNOSIS — C9000 Multiple myeloma not having achieved remission: Secondary | ICD-10-CM | POA: Insufficient documentation

## 2015-05-21 LAB — COMPREHENSIVE METABOLIC PANEL
ALT: 16 U/L — AB (ref 17–63)
AST: 24 U/L (ref 15–41)
Albumin: 3.7 g/dL (ref 3.5–5.0)
Alkaline Phosphatase: 44 U/L (ref 38–126)
Anion gap: 3 — ABNORMAL LOW (ref 5–15)
BILIRUBIN TOTAL: 0.6 mg/dL (ref 0.3–1.2)
BUN: 20 mg/dL (ref 6–20)
CO2: 23 mmol/L (ref 22–32)
CREATININE: 1.53 mg/dL — AB (ref 0.61–1.24)
Calcium: 8.7 mg/dL — ABNORMAL LOW (ref 8.9–10.3)
Chloride: 112 mmol/L — ABNORMAL HIGH (ref 101–111)
GFR calc Af Amer: 51 mL/min — ABNORMAL LOW (ref >60)
GFR, EST NON AFRICAN AMERICAN: 44 mL/min — AB (ref >60)
Glucose, Bld: 123 mg/dL — ABNORMAL HIGH (ref 65–99)
Potassium: 3.7 mmol/L (ref 3.5–5.1)
Sodium: 138 mmol/L (ref 135–145)
TOTAL PROTEIN: 7.2 g/dL (ref 6.5–8.1)

## 2015-05-21 LAB — CBC WITH DIFFERENTIAL/PLATELET
BASOS ABS: 0 10*3/uL (ref 0–0.1)
Basophils Relative: 1 %
Eosinophils Absolute: 0.2 10*3/uL (ref 0–0.7)
Eosinophils Relative: 7 %
HEMATOCRIT: 36.4 % — AB (ref 40.0–52.0)
Hemoglobin: 12.4 g/dL — ABNORMAL LOW (ref 13.0–18.0)
LYMPHS PCT: 35 %
Lymphs Abs: 1 10*3/uL (ref 1.0–3.6)
MCH: 31.6 pg (ref 26.0–34.0)
MCHC: 34.2 g/dL (ref 32.0–36.0)
MCV: 92.3 fL (ref 80.0–100.0)
MONO ABS: 0.4 10*3/uL (ref 0.2–1.0)
MONOS PCT: 13 %
NEUTROS ABS: 1.2 10*3/uL — AB (ref 1.4–6.5)
Neutrophils Relative %: 44 %
Platelets: 159 10*3/uL (ref 150–440)
RBC: 3.94 MIL/uL — ABNORMAL LOW (ref 4.40–5.90)
RDW: 14.7 % — ABNORMAL HIGH (ref 11.5–14.5)
WBC: 2.8 10*3/uL — ABNORMAL LOW (ref 3.8–10.6)

## 2015-05-21 NOTE — Telephone Encounter (Signed)
-----   Message from Cammie Sickle, MD sent at 05/21/2015  3:28 PM EDT ----- Please inform patient that labs look okay overall. ;Recommend increased water intake as his kidney numbers of slightly abnormal.  continue taking Revlimid as recommended; check CBC CMP in 1 month. Thx

## 2015-05-21 NOTE — Telephone Encounter (Signed)
Called and spoke with patient. Pt directed that labs are safe to continue with revlmid dosing. I encouraged him to increase his po fluid water intake.  Teach back process peformed. Pt already has an apt in 1 month for repeat labs and md apt.

## 2015-06-21 ENCOUNTER — Other Ambulatory Visit: Payer: Self-pay | Admitting: *Deleted

## 2015-06-21 DIAGNOSIS — C9000 Multiple myeloma not having achieved remission: Secondary | ICD-10-CM

## 2015-06-21 MED ORDER — LENALIDOMIDE 15 MG PO CAPS: ORAL_CAPSULE | ORAL | Status: DC

## 2015-06-21 NOTE — Telephone Encounter (Signed)
I gave you the auth # and you sent rx. Thank you

## 2015-06-25 ENCOUNTER — Ambulatory Visit: Payer: Medicare Other

## 2015-06-25 ENCOUNTER — Ambulatory Visit: Payer: Medicare Other | Admitting: Internal Medicine

## 2015-06-25 ENCOUNTER — Other Ambulatory Visit: Payer: Medicare Other

## 2015-07-01 ENCOUNTER — Inpatient Hospital Stay (HOSPITAL_BASED_OUTPATIENT_CLINIC_OR_DEPARTMENT_OTHER): Payer: Medicare Other | Admitting: Internal Medicine

## 2015-07-01 ENCOUNTER — Inpatient Hospital Stay: Payer: Medicare Other

## 2015-07-01 ENCOUNTER — Inpatient Hospital Stay: Payer: Medicare Other | Attending: Internal Medicine

## 2015-07-01 VITALS — BP 138/77 | HR 61 | Temp 96.0°F | Resp 20 | Ht 71.0 in | Wt 239.0 lb

## 2015-07-01 DIAGNOSIS — Z7982 Long term (current) use of aspirin: Secondary | ICD-10-CM | POA: Insufficient documentation

## 2015-07-01 DIAGNOSIS — E079 Disorder of thyroid, unspecified: Secondary | ICD-10-CM | POA: Diagnosis not present

## 2015-07-01 DIAGNOSIS — Z79899 Other long term (current) drug therapy: Secondary | ICD-10-CM | POA: Insufficient documentation

## 2015-07-01 DIAGNOSIS — I129 Hypertensive chronic kidney disease with stage 1 through stage 4 chronic kidney disease, or unspecified chronic kidney disease: Secondary | ICD-10-CM

## 2015-07-01 DIAGNOSIS — E785 Hyperlipidemia, unspecified: Secondary | ICD-10-CM | POA: Insufficient documentation

## 2015-07-01 DIAGNOSIS — K219 Gastro-esophageal reflux disease without esophagitis: Secondary | ICD-10-CM | POA: Diagnosis not present

## 2015-07-01 DIAGNOSIS — N189 Chronic kidney disease, unspecified: Secondary | ICD-10-CM

## 2015-07-01 DIAGNOSIS — E669 Obesity, unspecified: Secondary | ICD-10-CM

## 2015-07-01 DIAGNOSIS — Z85850 Personal history of malignant neoplasm of thyroid: Secondary | ICD-10-CM

## 2015-07-01 DIAGNOSIS — C9000 Multiple myeloma not having achieved remission: Secondary | ICD-10-CM | POA: Diagnosis not present

## 2015-07-01 DIAGNOSIS — C9001 Multiple myeloma in remission: Secondary | ICD-10-CM

## 2015-07-01 DIAGNOSIS — E1122 Type 2 diabetes mellitus with diabetic chronic kidney disease: Secondary | ICD-10-CM | POA: Diagnosis not present

## 2015-07-01 LAB — CBC WITH DIFFERENTIAL/PLATELET
Basophils Absolute: 0 10*3/uL (ref 0–0.1)
Basophils Relative: 1 %
EOS ABS: 0.2 10*3/uL (ref 0–0.7)
Eosinophils Relative: 6 %
HEMATOCRIT: 37 % — AB (ref 40.0–52.0)
HEMOGLOBIN: 12.6 g/dL — AB (ref 13.0–18.0)
LYMPHS ABS: 0.7 10*3/uL — AB (ref 1.0–3.6)
LYMPHS PCT: 23 %
MCH: 31.4 pg (ref 26.0–34.0)
MCHC: 34 g/dL (ref 32.0–36.0)
MCV: 92.3 fL (ref 80.0–100.0)
MONOS PCT: 9 %
Monocytes Absolute: 0.3 10*3/uL (ref 0.2–1.0)
NEUTROS PCT: 61 %
Neutro Abs: 1.8 10*3/uL (ref 1.4–6.5)
Platelets: 228 10*3/uL (ref 150–440)
RBC: 4.01 MIL/uL — AB (ref 4.40–5.90)
RDW: 16.5 % — ABNORMAL HIGH (ref 11.5–14.5)
WBC: 2.9 10*3/uL — AB (ref 3.8–10.6)

## 2015-07-01 LAB — COMPREHENSIVE METABOLIC PANEL
ALK PHOS: 55 U/L (ref 38–126)
ALT: 11 U/L — AB (ref 17–63)
ANION GAP: 6 (ref 5–15)
AST: 15 U/L (ref 15–41)
Albumin: 3.6 g/dL (ref 3.5–5.0)
BILIRUBIN TOTAL: 0.6 mg/dL (ref 0.3–1.2)
BUN: 16 mg/dL (ref 6–20)
CALCIUM: 9 mg/dL (ref 8.9–10.3)
CO2: 27 mmol/L (ref 22–32)
CREATININE: 1.4 mg/dL — AB (ref 0.61–1.24)
Chloride: 104 mmol/L (ref 101–111)
GFR, EST AFRICAN AMERICAN: 57 mL/min — AB (ref >60)
GFR, EST NON AFRICAN AMERICAN: 49 mL/min — AB (ref >60)
Glucose, Bld: 128 mg/dL — ABNORMAL HIGH (ref 65–99)
Potassium: 3.9 mmol/L (ref 3.5–5.1)
SODIUM: 137 mmol/L (ref 135–145)
TOTAL PROTEIN: 7.3 g/dL (ref 6.5–8.1)

## 2015-07-01 MED ORDER — ZOLEDRONIC ACID 4 MG/100ML IV SOLN
4.0000 mg | Freq: Once | INTRAVENOUS | Status: AC
  Administered 2015-07-01: 4 mg via INTRAVENOUS
  Filled 2015-07-01: qty 100

## 2015-07-01 NOTE — Progress Notes (Signed)
Patient here for follow up no complaints today. 

## 2015-07-01 NOTE — Progress Notes (Signed)
[place] OFFICE PROGRESS NOTE  Patient Care Team: Madelyn Brunner, MD as PCP - General (Internal Medicine) Druscilla Brownie, MD as Consulting Physician (Dermatology)   SUMMARY OF ONCOLOGIC HISTORY:  # April 2014- MULTIPLE MYELOMA [IgG- 2.2gm/dl; 40-50% plasma cell; Cyto-N; FISH- gain of chr.9, 15 & ccnd1/11q13] s/p RVD x6 [sep 2014; BMBx- ~5% plasma cells]; Maint Rev-DEX-Zometa; March 2015-Stopped Dex Cont- Rev-Zometa; July 2016- Rev 91m; NOV 4th  2016-HOLD; [facility] declined BMT.   # Re-START FEB 2017 Rev 240m3 W- on & 1 w OFF.   # Zometa q 35M  # CKD [creat ~1.4; sec MM]; DM  INTERVAL HISTORY:  A pleasant  [age]ear old male patient with above history of multiple myeloma currently on maintenance Revlimid is here for follow-up. Patient restarted Revlimid in February 2017- after he declined bone marrow transplant.  Patient's appetite is good. Denies any skin rash or diarrhea. No nausea no vomiting. No chest pain or shortness of breath or cough. Denies any tingling or numbness or new onset of back pain. Denies any swelling in the legs.   REVIEW OF SYSTEMS:  A complete 10 point review of system is done which is negative except mentioned above/history of present illness.   PAST MEDICAL HISTORY :  Past Medical History  Diagnosis Date  . Multiple myeloma ([place]  . Diabetes mellitus without complication ([facility]  . Hypertension   . Thyroid disease   . GERD (gastroesophageal reflux disease)   . Hyperlipidemia   . Obesity   . Thyroid cancer (H[facility]    PAST SURGICAL HISTORY :   Past Surgical History  Procedure Laterality Date  . Bone marrow biopsy  2014  . Cataract extraction bilateral w/ anterior vitrectomy      FAMILY HISTORY :   Family History  Problem Relation Age of Onset  . Cancer Father 6328. Diabetes Mother     SOCIAL HISTORY:   Social History  Substance Use Topics  . Smoking status: Never Smoker   . Smokeless tobacco: Never  Used  . Alcohol Use: No    ALLERGIES:  has No Known Allergies.  MEDICATIONS:  Current Outpatient Prescriptions  Medication Sig Dispense Refill  . acyclovir (ZOVIRAX) 400 MG tablet 400 mg 2 (two) times daily.    . Marland KitchenmLODipine (NORVASC) 10 MG tablet Take 10 mg by mouth daily.    . Marland Kitchenspirin EC 81 MG tablet Take 81 mg by mouth daily.    . Marland Kitchentorvastatin (LIPITOR) 20 MG tablet Take 20 mg by mouth daily.    . cloNIDine (CATAPRES) 0.1 MG tablet Take 0.1 mg by mouth 2 (two) times daily.    . Cyanocobalamin (RA VITAMIN B-12 TR) 1000 MCG TBCR Take by mouth.    . lenalidomide (REVLIMID) 15 MG capsule Daily for 3 weeks and then 1 week off Auth # [id number] capsule 0  . levothyroxine (SYNTHROID) 200 MCG tablet Take 1 tablet by mouth daily.    . Marland Kitchenosartan (COZAAR) 100 MG tablet Take 100 mg by mouth daily.    . Glory RosebushELICA LANCETS 3373UISC Inject 1 Lancet as directed once daily.    . pioglitazone (ACTOS) 30 MG tablet Take 30 mg by mouth daily.    . pioglitazone (ACTOS) 30 MG tablet Take by mouth.    . potassium chloride SA (K-DUR,KLOR-CON) 20 MEQ tablet   0  . quinapril (ACCUPRIL) 40 MG tablet Take 40 mg by mouth daily.    .[name]  quinapril (ACCUPRIL) 40 MG tablet     . sennosides-docusate sodium (SENOKOT-S) 8.6-50 MG tablet Take 2 tablets by mouth daily.     No current facility-administered medications for this visit.   Facility-Administered Medications Ordered in Other Visits  Medication Dose Route Frequency Provider Last Rate Last Dose  . 0.9 %  sodium chloride infusion   Intravenous Continuous Cammie Sickle, MD   Stopped at 12/11/14 1528    PHYSICAL EXAMINATION: ECOG PERFORMANCE STATUS: 0 - Asymptomatic  BP 138/77 mmHg  Pulse 61  Temp(Src) 96 F (35.6 C) (Tympanic)  Resp 20  Ht '5\' 11"'  (1.803 m)  Wt 238 lb 15.7 oz (108.4 kg)  BMI 33.35 kg/m2  Filed Weights   07/01/15 0850  Weight: 238 lb 15.7 oz (108.4 kg)    GENERAL: Well-nourished well-developed; Alert, no distress and  comfortable. He is alone.  No pallor or icterus OROPHARYNX: no thrush or ulceration; good dentition  NECK: supple, no masses felt LYMPH:  no palpable lymphadenopathy in the cervical, axillary or inguinal regions LUNGS: clear to auscultation and  No wheeze or crackles HEART/CVS: regular rate & rhythm and no murmurs; No lower extremity edema ABDOMEN:abdomen soft, non-tender and normal bowel sounds Musculoskeletal:no cyanosis of digits and no clubbing  PSYCH: alert & oriented x 3 with fluent speech NEURO: no focal motor/sensory deficits SKIN:  no rashes or significant lesions  LABORATORY DATA:  I have reviewed the data as listed    Component Value Date/Time   NA 137 07/01/2015 0827   NA 139 05/01/2014 1322   K 3.9 07/01/2015 0827   K 4.4 05/01/2014 1322   CL 104 07/01/2015 0827   CL 107 05/01/2014 1322   CO2 27 07/01/2015 0827   CO2 27 05/01/2014 1322   GLUCOSE 128* 07/01/2015 0827   GLUCOSE 102* 05/01/2014 1322   BUN 16 07/01/2015 0827   BUN 16 05/01/2014 1322   CREATININE 1.40* 07/01/2015 0827   CREATININE 1.48* 05/29/2014 0949   CALCIUM 9.0 07/01/2015 0827   CALCIUM 9.1 05/01/2014 1322   PROT 7.3 07/01/2015 0827   PROT 7.5 05/01/2014 1322   ALBUMIN 3.6 07/01/2015 0827   ALBUMIN 3.6 05/01/2014 1322   AST 15 07/01/2015 0827   AST 17 05/01/2014 1322   ALT 11* 07/01/2015 0827   ALT 13* 05/01/2014 1322   ALKPHOS 55 07/01/2015 0827   ALKPHOS 47 05/01/2014 1322   BILITOT 0.6 07/01/2015 0827   BILITOT 0.6 05/01/2014 1322   GFRNONAA 49* 07/01/2015 0827   GFRNONAA 47* 05/29/2014 0949   GFRNONAA 56* 03/10/2014 0946   GFRAA 57* 07/01/2015 0827   GFRAA 55* 05/29/2014 0949   GFRAA >60 03/10/2014 0946    No results found for: SPEP, UPEP  Lab Results  Component Value Date   WBC 2.9* 07/01/2015   NEUTROABS 1.8 07/01/2015   HGB 12.6* 07/01/2015   HCT 37.0* 07/01/2015   MCV 92.3 07/01/2015   PLT 228 07/01/2015      Chemistry      Component Value Date/Time   NA 137  07/01/2015 0827   NA 139 05/01/2014 1322   K 3.9 07/01/2015 0827   K 4.4 05/01/2014 1322   CL 104 07/01/2015 0827   CL 107 05/01/2014 1322   CO2 27 07/01/2015 0827   CO2 27 05/01/2014 1322   BUN 16 07/01/2015 0827   BUN 16 05/01/2014 1322   CREATININE 1.40* 07/01/2015 0827   CREATININE 1.48* 05/29/2014 0949      Component Value Date/Time  CALCIUM 9.0 07/01/2015 0827   CALCIUM 9.1 05/01/2014 1322   ALKPHOS 55 07/01/2015 0827   ALKPHOS 47 05/01/2014 1322   AST 15 07/01/2015 0827   AST 17 05/01/2014 1322   ALT 11* 07/01/2015 0827   ALT 13* 05/01/2014 1322   BILITOT 0.6 07/01/2015 0827   BILITOT 0.6 05/01/2014 1322        ASSESSMENT & PLAN:  Multiple myeloma currently on maintenance Revlimid 68m 3 w-On & 1 w Off; at least since September 2014. Patient tolerating maintenance Revlimid very well. Patient's most recent serum M protein Feb 2017- negative; serum free kappa/lambda light chain ratio normal.  Myeloma work up is pending from today.  # Continue Revlimid 25 mg 3 weeks on and one-week off. White count 2.9 absolute neutrophil count of 1.8 hemoglobin 12.6 platelets 228. CMP LFTs normal creatinine stable at 1.4. Patient will get CBC/CMP on a monthly basis.  # [facility] to discuss the reason for his cancellation of systems harvest  # Creatinine is 1.40- on  Zometa q 3 months; On calcium and vitamin D. He will be due today.   #Check monthly CBC CMP;  We'll follow up with me in 3 months/CBC CMP/ Zometa.   # 25 minutes face-to-face with the patient discussing the above plan of care; more than 50% of time spent on prognosis/ natural history; counseling and coordination.     GCammie Sickle MD 07/01/2015 9:05 AM

## 2015-08-02 ENCOUNTER — Telehealth: Payer: Self-pay | Admitting: *Deleted

## 2015-08-02 ENCOUNTER — Inpatient Hospital Stay: Payer: Medicare Other | Attending: Internal Medicine

## 2015-08-02 DIAGNOSIS — C9000 Multiple myeloma not having achieved remission: Secondary | ICD-10-CM | POA: Diagnosis not present

## 2015-08-02 LAB — COMPREHENSIVE METABOLIC PANEL
ALK PHOS: 54 U/L (ref 38–126)
ALT: 13 U/L — ABNORMAL LOW (ref 17–63)
ANION GAP: 4 — AB (ref 5–15)
AST: 17 U/L (ref 15–41)
Albumin: 3.8 g/dL (ref 3.5–5.0)
BILIRUBIN TOTAL: 0.7 mg/dL (ref 0.3–1.2)
BUN: 24 mg/dL — AB (ref 6–20)
CHLORIDE: 109 mmol/L (ref 101–111)
CO2: 26 mmol/L (ref 22–32)
Calcium: 8.6 mg/dL — ABNORMAL LOW (ref 8.9–10.3)
Creatinine, Ser: 1.26 mg/dL — ABNORMAL HIGH (ref 0.61–1.24)
GFR calc Af Amer: >60 mL/min (ref >60)
GFR calc non Af Amer: 56 mL/min — ABNORMAL LOW (ref >60)
GLUCOSE: 156 mg/dL — AB (ref 65–99)
POTASSIUM: 3.7 mmol/L (ref 3.5–5.1)
Sodium: 139 mmol/L (ref 135–145)
TOTAL PROTEIN: 7.4 g/dL (ref 6.5–8.1)

## 2015-08-02 LAB — CBC WITH DIFFERENTIAL/PLATELET
BASOS PCT: 1 %
Basophils Absolute: 0 10*3/uL (ref 0–0.1)
EOS ABS: 0.2 10*3/uL (ref 0–0.7)
EOS PCT: 6 %
HEMATOCRIT: 36.5 % — AB (ref 40.0–52.0)
HEMOGLOBIN: 12.4 g/dL — AB (ref 13.0–18.0)
Lymphocytes Relative: 26 %
Lymphs Abs: 0.8 10*3/uL — ABNORMAL LOW (ref 1.0–3.6)
MCH: 31.7 pg (ref 26.0–34.0)
MCHC: 33.9 g/dL (ref 32.0–36.0)
MCV: 93.2 fL (ref 80.0–100.0)
MONO ABS: 0.3 10*3/uL (ref 0.2–1.0)
MONOS PCT: 9 %
Neutro Abs: 1.8 10*3/uL (ref 1.4–6.5)
Neutrophils Relative %: 58 %
Platelets: 206 10*3/uL (ref 150–440)
RBC: 3.91 MIL/uL — ABNORMAL LOW (ref 4.40–5.90)
RDW: 17.1 % — ABNORMAL HIGH (ref 11.5–14.5)
WBC: 3 10*3/uL — ABNORMAL LOW (ref 3.8–10.6)

## 2015-08-02 NOTE — Telephone Encounter (Signed)
Called patient to inform him that his labs are all good.  He is to continue Revlimid as ordered.  Verbalized understanding.

## 2015-08-02 NOTE — Telephone Encounter (Signed)
-----   Message from Cammie Sickle, MD sent at 08/02/2015 12:43 PM EDT ----- Please inform patient that his labs look okay to continue Revlimid as ordered. Thx

## 2015-08-26 ENCOUNTER — Inpatient Hospital Stay: Payer: Medicare Other | Attending: Internal Medicine

## 2015-08-26 DIAGNOSIS — C9000 Multiple myeloma not having achieved remission: Secondary | ICD-10-CM | POA: Diagnosis not present

## 2015-08-26 LAB — COMPREHENSIVE METABOLIC PANEL
ALK PHOS: 57 U/L (ref 38–126)
ALT: 13 U/L — AB (ref 17–63)
ANION GAP: 4 — AB (ref 5–15)
AST: 19 U/L (ref 15–41)
Albumin: 3.8 g/dL (ref 3.5–5.0)
BUN: 18 mg/dL (ref 6–20)
CALCIUM: 9.2 mg/dL (ref 8.9–10.3)
CO2: 24 mmol/L (ref 22–32)
CREATININE: 1.32 mg/dL — AB (ref 0.61–1.24)
Chloride: 107 mmol/L (ref 101–111)
GFR, EST NON AFRICAN AMERICAN: 53 mL/min — AB (ref >60)
Glucose, Bld: 109 mg/dL — ABNORMAL HIGH (ref 65–99)
Potassium: 3.8 mmol/L (ref 3.5–5.1)
SODIUM: 135 mmol/L (ref 135–145)
TOTAL PROTEIN: 7.7 g/dL (ref 6.5–8.1)
Total Bilirubin: 0.6 mg/dL (ref 0.3–1.2)

## 2015-08-26 LAB — CBC WITH DIFFERENTIAL/PLATELET
Basophils Absolute: 0 10*3/uL (ref 0–0.1)
Basophils Relative: 2 %
EOS ABS: 0.2 10*3/uL (ref 0–0.7)
EOS PCT: 8 %
HCT: 37.5 % — ABNORMAL LOW (ref 40.0–52.0)
HEMOGLOBIN: 12.7 g/dL — AB (ref 13.0–18.0)
LYMPHS ABS: 0.8 10*3/uL — AB (ref 1.0–3.6)
LYMPHS PCT: 28 %
MCH: 31.3 pg (ref 26.0–34.0)
MCHC: 33.8 g/dL (ref 32.0–36.0)
MCV: 92.6 fL (ref 80.0–100.0)
MONOS PCT: 17 %
Monocytes Absolute: 0.5 10*3/uL (ref 0.2–1.0)
NEUTROS PCT: 45 %
Neutro Abs: 1.3 10*3/uL — ABNORMAL LOW (ref 1.4–6.5)
Platelets: 209 10*3/uL (ref 150–440)
RBC: 4.05 MIL/uL — AB (ref 4.40–5.90)
RDW: 16.4 % — ABNORMAL HIGH (ref 11.5–14.5)
WBC: 2.9 10*3/uL — AB (ref 3.8–10.6)

## 2015-08-27 LAB — KAPPA/LAMBDA LIGHT CHAINS
Kappa free light chain: 49.9 mg/L — ABNORMAL HIGH (ref 3.3–19.4)
Kappa, lambda light chain ratio: 1.29 (ref 0.26–1.65)
Lambda free light chains: 38.8 mg/L — ABNORMAL HIGH (ref 5.7–26.3)

## 2015-08-30 ENCOUNTER — Inpatient Hospital Stay: Payer: Medicare Other

## 2015-08-31 LAB — MULTIPLE MYELOMA PANEL, SERUM
ALBUMIN/GLOB SERPL: 1 (ref 0.7–1.7)
Albumin SerPl Elph-Mcnc: 3.3 g/dL (ref 2.9–4.4)
Alpha 1: 0.2 g/dL (ref 0.0–0.4)
Alpha2 Glob SerPl Elph-Mcnc: 0.7 g/dL (ref 0.4–1.0)
B-Globulin SerPl Elph-Mcnc: 1.1 g/dL (ref 0.7–1.3)
GAMMA GLOB SERPL ELPH-MCNC: 1.6 g/dL (ref 0.4–1.8)
GLOBULIN, TOTAL: 3.6 g/dL (ref 2.2–3.9)
IGA: 400 mg/dL (ref 61–437)
IGM, SERUM: 41 mg/dL (ref 15–143)
IgG (Immunoglobin G), Serum: 1445 mg/dL (ref 700–1600)
Total Protein ELP: 6.9 g/dL (ref 6.0–8.5)

## 2015-10-01 ENCOUNTER — Inpatient Hospital Stay: Payer: Medicare Other | Attending: Internal Medicine

## 2015-10-01 ENCOUNTER — Inpatient Hospital Stay: Payer: Medicare Other

## 2015-10-01 ENCOUNTER — Encounter: Payer: Self-pay | Admitting: Internal Medicine

## 2015-10-01 ENCOUNTER — Inpatient Hospital Stay (HOSPITAL_BASED_OUTPATIENT_CLINIC_OR_DEPARTMENT_OTHER): Payer: Medicare Other | Admitting: Internal Medicine

## 2015-10-01 DIAGNOSIS — C9000 Multiple myeloma not having achieved remission: Secondary | ICD-10-CM | POA: Diagnosis not present

## 2015-10-01 DIAGNOSIS — K219 Gastro-esophageal reflux disease without esophagitis: Secondary | ICD-10-CM

## 2015-10-01 DIAGNOSIS — N189 Chronic kidney disease, unspecified: Secondary | ICD-10-CM

## 2015-10-01 DIAGNOSIS — Z79899 Other long term (current) drug therapy: Secondary | ICD-10-CM | POA: Insufficient documentation

## 2015-10-01 DIAGNOSIS — Z7982 Long term (current) use of aspirin: Secondary | ICD-10-CM | POA: Diagnosis not present

## 2015-10-01 DIAGNOSIS — E1122 Type 2 diabetes mellitus with diabetic chronic kidney disease: Secondary | ICD-10-CM | POA: Insufficient documentation

## 2015-10-01 DIAGNOSIS — Z85850 Personal history of malignant neoplasm of thyroid: Secondary | ICD-10-CM | POA: Diagnosis not present

## 2015-10-01 DIAGNOSIS — I129 Hypertensive chronic kidney disease with stage 1 through stage 4 chronic kidney disease, or unspecified chronic kidney disease: Secondary | ICD-10-CM

## 2015-10-01 DIAGNOSIS — E785 Hyperlipidemia, unspecified: Secondary | ICD-10-CM

## 2015-10-01 DIAGNOSIS — E079 Disorder of thyroid, unspecified: Secondary | ICD-10-CM | POA: Diagnosis not present

## 2015-10-01 DIAGNOSIS — C9001 Multiple myeloma in remission: Secondary | ICD-10-CM

## 2015-10-01 LAB — COMPREHENSIVE METABOLIC PANEL
ALK PHOS: 48 U/L (ref 38–126)
ALT: 11 U/L — AB (ref 17–63)
AST: 17 U/L (ref 15–41)
Albumin: 3.8 g/dL (ref 3.5–5.0)
Anion gap: 4 — ABNORMAL LOW (ref 5–15)
BUN: 20 mg/dL (ref 6–20)
CALCIUM: 8.8 mg/dL — AB (ref 8.9–10.3)
CO2: 25 mmol/L (ref 22–32)
CREATININE: 1.37 mg/dL — AB (ref 0.61–1.24)
Chloride: 105 mmol/L (ref 101–111)
GFR, EST AFRICAN AMERICAN: 58 mL/min — AB (ref >60)
GFR, EST NON AFRICAN AMERICAN: 50 mL/min — AB (ref >60)
Glucose, Bld: 154 mg/dL — ABNORMAL HIGH (ref 65–99)
Potassium: 4.3 mmol/L (ref 3.5–5.1)
SODIUM: 134 mmol/L — AB (ref 135–145)
Total Bilirubin: 0.5 mg/dL (ref 0.3–1.2)
Total Protein: 7.8 g/dL (ref 6.5–8.1)

## 2015-10-01 LAB — CBC WITH DIFFERENTIAL/PLATELET
BASOS ABS: 0.1 10*3/uL (ref 0–0.1)
BASOS PCT: 2 %
EOS ABS: 0.1 10*3/uL (ref 0–0.7)
Eosinophils Relative: 4 %
HEMATOCRIT: 35.7 % — AB (ref 40.0–52.0)
Hemoglobin: 12.3 g/dL — ABNORMAL LOW (ref 13.0–18.0)
Lymphocytes Relative: 27 %
Lymphs Abs: 0.8 10*3/uL — ABNORMAL LOW (ref 1.0–3.6)
MCH: 32.1 pg (ref 26.0–34.0)
MCHC: 34.4 g/dL (ref 32.0–36.0)
MCV: 93.3 fL (ref 80.0–100.0)
MONO ABS: 0.3 10*3/uL (ref 0.2–1.0)
MONOS PCT: 10 %
NEUTROS ABS: 1.7 10*3/uL (ref 1.4–6.5)
Neutrophils Relative %: 57 %
PLATELETS: 192 10*3/uL (ref 150–440)
RBC: 3.83 MIL/uL — ABNORMAL LOW (ref 4.40–5.90)
RDW: 16.7 % — AB (ref 11.5–14.5)
WBC: 3.1 10*3/uL — ABNORMAL LOW (ref 3.8–10.6)

## 2015-10-01 MED ORDER — ZOLEDRONIC ACID 4 MG/100ML IV SOLN
4.0000 mg | Freq: Once | INTRAVENOUS | Status: AC
  Administered 2015-10-01: 4 mg via INTRAVENOUS
  Filled 2015-10-01: qty 100

## 2015-10-01 MED ORDER — SODIUM CHLORIDE 0.9 % IV SOLN
INTRAVENOUS | Status: DC
  Administered 2015-10-01: 15:00:00 via INTRAVENOUS

## 2015-10-01 MED ORDER — LENALIDOMIDE 15 MG PO CAPS: ORAL_CAPSULE | ORAL | 6 refills | Status: DC

## 2015-10-01 NOTE — Progress Notes (Signed)
Pt has no concerns except if he is supposed to continue his revelimid?

## 2015-10-01 NOTE — Assessment & Plan Note (Addendum)
Multiple myeloma currently on maintenance Revlimid 29m 3 w-On & 1 w Off; at least since September 2014. Patient tolerating maintenance Revlimid very well.   # no concerns for progression. Patient's most recent serum M protein July 2017 negative; serum free kappa/lambda light chain ratio normal.  Again ordered in 2 months.  # Continue Revlimid 25 mg 3 weeks on and one-week off. We will refill revlimid.   # Creatinine is 1.40- on  Zometa q 3 months; On calcium and vitamin D. He will be due today.   #Check monthly CBC CMP;  We'll follow up with me in 3 months/CBC CMP/ Zometa.

## 2015-10-01 NOTE — Progress Notes (Signed)
[place] OFFICE PROGRESS NOTE  Patient Care Team: Madelyn Brunner, MD as PCP - General (Internal Medicine) Druscilla Brownie, MD as Consulting Physician (Dermatology)   SUMMARY OF ONCOLOGIC HISTORY: Oncology History    # April 2014- MULTIPLE MYELOMA [IgG- 2.2gm/dl; 40-50% plasma cell; Cyto-N; FISH- gain of chr.9, 15 & ccnd1/11q13] s/p RVD x6 [sep 2014; BMBx- ~5% plasma cells]; Maint Rev-DEX-Zometa; March 2015-Stopped Dex Cont- Rev-Zometa; July 2016- Rev 54m; NOV 4th  2016-HOLD; [facility] declined BMT.   # Re-START FEB 2017 Rev 260m3 W- on & 1 w OFF.   # Zometa q 23M  # CKD [creat ~1.4; sec MM]; DM     Multiple myeloma in remission ([facility]  10/01/2015 Initial Diagnosis    [place] myeloma in remission ([facility]      INTERVAL HISTORY:  A pleasant  [age]ear old male patient with above history of multiple myeloma currently on maintenance Revlimid is here for follow-up. Patient restarted Revlimid in February 2017- after he declined bone marrow transplant.  Denies any tingling or numbness or new onset of back pain. Denies any swelling in the legs. Patient's appetite is good. Denies any skin rash or diarrhea. No nausea no vomiting. No chest pain or shortness of breath or cough.    REVIEW OF SYSTEMS:  A complete 10 point review of system is done which is negative except mentioned above/history of present illness.   PAST MEDICAL HISTORY :  Past Medical History:  Diagnosis Date  . Diabetes mellitus without complication ([facility]CE[facility]  . GERD (gastroesophageal reflux disease)   . Hyperlipidemia   . Hypertension   . Multiple myeloma ([facility]  . Obesity   . Thyroid cancer ([facility]  . Thyroid disease     PAST SURGICAL HISTORY :   Past Surgical History:  Procedure Laterality Date  . BONE MARROW BIOPSY  2014  . CATARACT EXTRACTION BILATERAL W/ ANTERIOR VITRECTOMY      FAMILY HISTORY :   Family History  Problem Relation Age of Onset  . Cancer Father [id number].  Diabetes Mother     SOCIAL HISTORY:   Social History  Substance Use Topics  . Smoking status: Never Smoker  . Smokeless tobacco: Never Used  . Alcohol use No    ALLERGIES:  has No Known Allergies.  MEDICATIONS:  Current Outpatient Prescriptions  Medication Sig Dispense Refill  . acyclovir (ZOVIRAX) 400 MG tablet 400 mg 2 (two) times daily.    . Marland KitchenmLODipine (NORVASC) 10 MG tablet Take 10 mg by mouth daily.    . Marland Kitchenspirin EC 81 MG tablet Take 81 mg by mouth daily.    . Marland Kitchentorvastatin (LIPITOR) 20 MG tablet Take 20 mg by mouth daily.    . cloNIDine (CATAPRES) 0.1 MG tablet Take 0.1 mg by mouth 2 (two) times daily.    . Cyanocobalamin (RA VITAMIN B-12 TR) 1000 MCG TBCR Take by mouth.    . levothyroxine (SYNTHROID) 200 MCG tablet Take 1 tablet by mouth daily.    . Marland Kitchenosartan (COZAAR) 100 MG tablet Take 100 mg by mouth daily.    . Glory RosebushELICA LANCETS [id number] Inject 1 Lancet as directed once daily.    . pioglitazone (ACTOS) 30 MG tablet Take 30 mg by mouth daily.    . potassium chloride SA (K-DUR,KLOR-CON) 20 MEQ tablet   0  . quinapril (ACCUPRIL) 40 MG tablet Take 40 mg by mouth daily.    . sennosides-docusate sodium (SENOKOT-S)  8.6-50 MG tablet Take 2 tablets by mouth daily.    [name] lenalidomide (REVLIMID) 15 MG capsule Daily for 3 weeks and then 1 week off Auth # [id number] 21 capsule 6   No current facility-administered medications for this visit.    Facility-Administered Medications Ordered in Other Visits  Medication Dose Route Frequency Provider Last Rate Last Dose  . 0.9 %  sodium chloride infusion   Intravenous Continuous Cammie Sickle, MD   Stopped at 12/11/14 1528  . 0.9 %  sodium chloride infusion   Intravenous Continuous Lequita Asal, MD   Stopped at 10/01/15 1603    PHYSICAL EXAMINATION: ECOG PERFORMANCE STATUS: 0 - Asymptomatic  BP (!) 147/51 (BP Location: Left Arm, Patient Position: Sitting)   Pulse (!) 57   Temp (!) 96.9 F (36.1 C) (Tympanic)   Resp 17    Ht '5\' 10"'  (1.778 m)   Wt 241 lb 12.8 oz (109.7 kg)   BMI 34.69 kg/m   Filed Weights   10/01/15 1409  Weight: 241 lb 12.8 oz (109.7 kg)    GENERAL: Well-nourished well-developed; Alert, no distress and comfortable. He is alone.  No pallor or icterus OROPHARYNX: no thrush or ulceration; good dentition  NECK: supple, no masses felt LYMPH:  no palpable lymphadenopathy in the cervical, axillary or inguinal regions LUNGS: clear to auscultation and  No wheeze or crackles HEART/CVS: regular rate & rhythm and no murmurs; No lower extremity edema ABDOMEN:abdomen soft, non-tender and normal bowel sounds Musculoskeletal:no cyanosis of digits and no clubbing  PSYCH: alert & oriented x 3 with fluent speech NEURO: no focal motor/sensory deficits SKIN:  no rashes or significant lesions  LABORATORY DATA:  I have reviewed the data as listed    Component Value Date/Time   NA 134 (L) 10/01/2015 1345   NA 139 05/01/2014 1322   K 4.3 10/01/2015 1345   K 4.4 05/01/2014 1322   CL 105 10/01/2015 1345   CL 107 05/01/2014 1322   CO2 25 10/01/2015 1345   CO2 27 05/01/2014 1322   GLUCOSE 154 (H) 10/01/2015 1345   GLUCOSE 102 (H) 05/01/2014 1322   BUN 20 10/01/2015 1345   BUN 16 05/01/2014 1322   CREATININE 1.37 (H) 10/01/2015 1345   CREATININE 1.48 (H) 05/29/2014 0949   CALCIUM 8.8 (L) 10/01/2015 1345   CALCIUM 9.1 05/01/2014 1322   PROT 7.8 10/01/2015 1345   PROT 7.5 05/01/2014 1322   ALBUMIN 3.8 10/01/2015 1345   ALBUMIN 3.6 05/01/2014 1322   AST 17 10/01/2015 1345   AST 17 05/01/2014 1322   ALT 11 (L) 10/01/2015 1345   ALT 13 (L) 05/01/2014 1322   ALKPHOS 48 10/01/2015 1345   ALKPHOS 47 05/01/2014 1322   BILITOT 0.5 10/01/2015 1345   BILITOT 0.6 05/01/2014 1322   GFRNONAA 50 (L) 10/01/2015 1345   GFRNONAA 47 (L) 05/29/2014 0949   GFRAA 58 (L) 10/01/2015 1345   GFRAA 55 (L) 05/29/2014 0949    No results found for: SPEP, UPEP  Lab Results  Component Value Date   WBC 3.1 (L)  10/01/2015   NEUTROABS 1.7 10/01/2015   HGB 12.3 (L) 10/01/2015   HCT 35.7 (L) 10/01/2015   MCV 93.3 10/01/2015   PLT 192 10/01/2015      Chemistry      Component Value Date/Time   NA 134 (L) 10/01/2015 1345   NA 139 05/01/2014 1322   K 4.3 10/01/2015 1345   K 4.4 05/01/2014 1322   CL 105 10/01/2015  1345   CL 107 05/01/2014 1322   CO2 25 10/01/2015 1345   CO2 27 05/01/2014 1322   BUN 20 10/01/2015 1345   BUN 16 05/01/2014 1322   CREATININE 1.37 (H) 10/01/2015 1345   CREATININE 1.48 (H) 05/29/2014 0949      Component Value Date/Time   CALCIUM 8.8 (L) 10/01/2015 1345   CALCIUM 9.1 05/01/2014 1322   ALKPHOS 48 10/01/2015 1345   ALKPHOS 47 05/01/2014 1322   AST 17 10/01/2015 1345   AST 17 05/01/2014 1322   ALT 11 (L) 10/01/2015 1345   ALT 13 (L) 05/01/2014 1322   BILITOT 0.5 10/01/2015 1345   BILITOT 0.6 05/01/2014 1322        ASSESSMENT & PLAN:  Multiple myeloma in remission ([place]) Multiple myeloma currently on maintenance Revlimid 57m 3 w-On & 1 w Off; at least since September 2014. Patient tolerating maintenance Revlimid very well.   # no concerns for progression. Patient's most recent serum M protein July 2017 negative; serum free kappa/lambda light chain ratio normal.  Again ordered in 2 months.  # Continue Revlimid 25 mg 3 weeks on and one-week off. We will refill revlimid.   # Creatinine is 1.40- on  Zometa q 3 months; On calcium and vitamin D. He will be due today.   #Check monthly CBC CMP;  We'll follow up with me in 3 months/CBC CMP/ Zometa.         GCammie Sickle MD 10/01/2015 4:34 PM

## 2015-10-06 ENCOUNTER — Other Ambulatory Visit: Payer: Self-pay | Admitting: *Deleted

## 2015-10-06 DIAGNOSIS — C9000 Multiple myeloma not having achieved remission: Secondary | ICD-10-CM

## 2015-10-06 DIAGNOSIS — C9001 Multiple myeloma in remission: Secondary | ICD-10-CM

## 2015-10-06 MED ORDER — LENALIDOMIDE 15 MG PO CAPS: ORAL_CAPSULE | ORAL | 0 refills | Status: DC

## 2015-11-01 ENCOUNTER — Inpatient Hospital Stay: Payer: Medicare Other | Attending: Internal Medicine

## 2015-11-01 DIAGNOSIS — C9000 Multiple myeloma not having achieved remission: Secondary | ICD-10-CM

## 2015-11-01 DIAGNOSIS — C9001 Multiple myeloma in remission: Secondary | ICD-10-CM

## 2015-11-01 LAB — CBC WITH DIFFERENTIAL/PLATELET
Basophils Absolute: 0 10*3/uL (ref 0–0.1)
Basophils Relative: 1 %
Eosinophils Absolute: 0.1 10*3/uL (ref 0–0.7)
Eosinophils Relative: 6 %
HEMATOCRIT: 36.7 % — AB (ref 40.0–52.0)
HEMOGLOBIN: 12.4 g/dL — AB (ref 13.0–18.0)
LYMPHS ABS: 0.9 10*3/uL — AB (ref 1.0–3.6)
Lymphocytes Relative: 39 %
MCH: 31.3 pg (ref 26.0–34.0)
MCHC: 33.9 g/dL (ref 32.0–36.0)
MCV: 92.1 fL (ref 80.0–100.0)
MONO ABS: 0.3 10*3/uL (ref 0.2–1.0)
MONOS PCT: 15 %
NEUTROS ABS: 0.8 10*3/uL — AB (ref 1.4–6.5)
NEUTROS PCT: 39 %
Platelets: 156 10*3/uL (ref 150–440)
RBC: 3.98 MIL/uL — ABNORMAL LOW (ref 4.40–5.90)
RDW: 15.6 % — AB (ref 11.5–14.5)
WBC: 2.2 10*3/uL — ABNORMAL LOW (ref 3.8–10.6)

## 2015-11-01 LAB — COMPREHENSIVE METABOLIC PANEL
ALK PHOS: 55 U/L (ref 38–126)
ALT: 18 U/L (ref 17–63)
ANION GAP: 3 — AB (ref 5–15)
AST: 20 U/L (ref 15–41)
Albumin: 3.5 g/dL (ref 3.5–5.0)
BILIRUBIN TOTAL: 0.7 mg/dL (ref 0.3–1.2)
BUN: 16 mg/dL (ref 6–20)
CALCIUM: 8.5 mg/dL — AB (ref 8.9–10.3)
CO2: 26 mmol/L (ref 22–32)
Chloride: 110 mmol/L (ref 101–111)
Creatinine, Ser: 1.4 mg/dL — ABNORMAL HIGH (ref 0.61–1.24)
GFR, EST AFRICAN AMERICAN: 57 mL/min — AB (ref >60)
GFR, EST NON AFRICAN AMERICAN: 49 mL/min — AB (ref >60)
GLUCOSE: 129 mg/dL — AB (ref 65–99)
Potassium: 4 mmol/L (ref 3.5–5.1)
Sodium: 139 mmol/L (ref 135–145)
TOTAL PROTEIN: 7.4 g/dL (ref 6.5–8.1)

## 2015-11-25 ENCOUNTER — Telehealth: Payer: Self-pay | Admitting: *Deleted

## 2015-11-25 NOTE — Telephone Encounter (Signed)
Patient dropped off papers work for RN stating that his leukemia/lymphoma society program for his Revlimid financial assistance.  Funding has closed.  Spoke with patient's wife. Information provided for [employer]'s phone number [phone].  Also spoke with patient via his cell number. He was not at home, but I explained that his wife wrote down the information for [employer] and contact information. I explained that [employer] is opened Monday-Friday 8a-8p, They could help him apply for an application with [employer] for pt drug assistance.  I left a msg with [employer] at [phone] to ask [employer] to reach to cancer center and patient to help with drug assistance.  I contacted Elease Etienne in cancer ctr social work to see if he can help facilitate financial assistance in this area and advocate for the patient with [place].

## 2015-11-30 NOTE — Progress Notes (Unsigned)
PSN left a message with [employer] patient support representative, Coralyn Mark, to find out the status of patient's application for assistance with the drug, Revlimid.   Ms Dewaine Oats department is out of the office until tomorrow.  Awaiting a call back from Ms. Peterson Ao when she arrives back to the office.

## 2015-12-01 ENCOUNTER — Inpatient Hospital Stay: Payer: Medicare Other | Attending: Internal Medicine

## 2015-12-01 DIAGNOSIS — C9000 Multiple myeloma not having achieved remission: Secondary | ICD-10-CM | POA: Diagnosis not present

## 2015-12-01 DIAGNOSIS — C9001 Multiple myeloma in remission: Secondary | ICD-10-CM

## 2015-12-01 LAB — COMPREHENSIVE METABOLIC PANEL
ALBUMIN: 3.7 g/dL (ref 3.5–5.0)
ALK PHOS: 54 U/L (ref 38–126)
ALT: 15 U/L — ABNORMAL LOW (ref 17–63)
ANION GAP: 6 (ref 5–15)
AST: 22 U/L (ref 15–41)
BILIRUBIN TOTAL: 0.7 mg/dL (ref 0.3–1.2)
BUN: 16 mg/dL (ref 6–20)
CALCIUM: 8.6 mg/dL — AB (ref 8.9–10.3)
CO2: 26 mmol/L (ref 22–32)
Chloride: 106 mmol/L (ref 101–111)
Creatinine, Ser: 1.32 mg/dL — ABNORMAL HIGH (ref 0.61–1.24)
GFR calc Af Amer: >60 mL/min (ref >60)
GFR, EST NON AFRICAN AMERICAN: 53 mL/min — AB (ref >60)
GLUCOSE: 107 mg/dL — AB (ref 65–99)
Potassium: 3.7 mmol/L (ref 3.5–5.1)
Sodium: 138 mmol/L (ref 135–145)
TOTAL PROTEIN: 7.6 g/dL (ref 6.5–8.1)

## 2015-12-01 LAB — CBC WITH DIFFERENTIAL/PLATELET
Basophils Absolute: 0 10*3/uL (ref 0–0.1)
Basophils Relative: 1 %
Eosinophils Absolute: 0.3 10*3/uL (ref 0–0.7)
Eosinophils Relative: 10 %
HEMATOCRIT: 36.9 % — AB (ref 40.0–52.0)
HEMOGLOBIN: 12.5 g/dL — AB (ref 13.0–18.0)
LYMPHS ABS: 0.8 10*3/uL — AB (ref 1.0–3.6)
LYMPHS PCT: 32 %
MCH: 31.3 pg (ref 26.0–34.0)
MCHC: 34 g/dL (ref 32.0–36.0)
MCV: 92.1 fL (ref 80.0–100.0)
MONOS PCT: 17 %
Monocytes Absolute: 0.4 10*3/uL (ref 0.2–1.0)
NEUTROS ABS: 1.1 10*3/uL — AB (ref 1.4–6.5)
NEUTROS PCT: 40 %
Platelets: 161 10*3/uL (ref 150–440)
RBC: 4.01 MIL/uL — ABNORMAL LOW (ref 4.40–5.90)
RDW: 15.3 % — ABNORMAL HIGH (ref 11.5–14.5)
WBC: 2.7 10*3/uL — ABNORMAL LOW (ref 3.8–10.6)

## 2015-12-02 NOTE — Progress Notes (Unsigned)
PSN faxed patient's 2016 [employer] tax return to [place] patient assistance program yesterday.

## 2015-12-08 ENCOUNTER — Other Ambulatory Visit: Payer: Self-pay | Admitting: *Deleted

## 2015-12-08 DIAGNOSIS — C9001 Multiple myeloma in remission: Secondary | ICD-10-CM

## 2015-12-08 DIAGNOSIS — C9000 Multiple myeloma not having achieved remission: Secondary | ICD-10-CM

## 2015-12-08 MED ORDER — LENALIDOMIDE 15 MG PO CAPS: ORAL_CAPSULE | ORAL | 0 refills | Status: DC

## 2015-12-10 ENCOUNTER — Other Ambulatory Visit: Payer: Self-pay | Admitting: *Deleted

## 2015-12-10 DIAGNOSIS — C9000 Multiple myeloma not having achieved remission: Secondary | ICD-10-CM

## 2015-12-10 DIAGNOSIS — C9001 Multiple myeloma in remission: Secondary | ICD-10-CM

## 2015-12-10 NOTE — Telephone Encounter (Signed)
Alan Alexander this was sent 11/1

## 2016-01-03 ENCOUNTER — Inpatient Hospital Stay: Payer: Medicare Other

## 2016-01-03 ENCOUNTER — Inpatient Hospital Stay: Payer: Medicare Other | Attending: Internal Medicine | Admitting: Internal Medicine

## 2016-01-03 VITALS — BP 131/73 | HR 49 | Temp 96.5°F | Wt 239.1 lb

## 2016-01-03 DIAGNOSIS — Z85850 Personal history of malignant neoplasm of thyroid: Secondary | ICD-10-CM

## 2016-01-03 DIAGNOSIS — Z809 Family history of malignant neoplasm, unspecified: Secondary | ICD-10-CM | POA: Diagnosis not present

## 2016-01-03 DIAGNOSIS — N189 Chronic kidney disease, unspecified: Secondary | ICD-10-CM | POA: Diagnosis not present

## 2016-01-03 DIAGNOSIS — Z7982 Long term (current) use of aspirin: Secondary | ICD-10-CM

## 2016-01-03 DIAGNOSIS — Z79899 Other long term (current) drug therapy: Secondary | ICD-10-CM | POA: Diagnosis not present

## 2016-01-03 DIAGNOSIS — I129 Hypertensive chronic kidney disease with stage 1 through stage 4 chronic kidney disease, or unspecified chronic kidney disease: Secondary | ICD-10-CM | POA: Diagnosis not present

## 2016-01-03 DIAGNOSIS — C9000 Multiple myeloma not having achieved remission: Secondary | ICD-10-CM

## 2016-01-03 DIAGNOSIS — E1122 Type 2 diabetes mellitus with diabetic chronic kidney disease: Secondary | ICD-10-CM | POA: Diagnosis not present

## 2016-01-03 DIAGNOSIS — K219 Gastro-esophageal reflux disease without esophagitis: Secondary | ICD-10-CM | POA: Insufficient documentation

## 2016-01-03 DIAGNOSIS — E785 Hyperlipidemia, unspecified: Secondary | ICD-10-CM | POA: Diagnosis not present

## 2016-01-03 DIAGNOSIS — C9001 Multiple myeloma in remission: Secondary | ICD-10-CM

## 2016-01-03 LAB — COMPREHENSIVE METABOLIC PANEL
ALT: 15 U/L — ABNORMAL LOW (ref 17–63)
ANION GAP: 8 (ref 5–15)
AST: 17 U/L (ref 15–41)
Albumin: 3.6 g/dL (ref 3.5–5.0)
Alkaline Phosphatase: 48 U/L (ref 38–126)
BUN: 18 mg/dL (ref 6–20)
CALCIUM: 8.8 mg/dL — AB (ref 8.9–10.3)
CHLORIDE: 105 mmol/L (ref 101–111)
CO2: 25 mmol/L (ref 22–32)
Creatinine, Ser: 1.18 mg/dL (ref 0.61–1.24)
GFR calc non Af Amer: >60 mL/min (ref >60)
Glucose, Bld: 119 mg/dL — ABNORMAL HIGH (ref 65–99)
Potassium: 4.2 mmol/L (ref 3.5–5.1)
SODIUM: 138 mmol/L (ref 135–145)
Total Bilirubin: 0.6 mg/dL (ref 0.3–1.2)
Total Protein: 7.6 g/dL (ref 6.5–8.1)

## 2016-01-03 LAB — CBC WITH DIFFERENTIAL/PLATELET
Basophils Absolute: 0 10*3/uL (ref 0–0.1)
Basophils Relative: 1 %
EOS ABS: 0.3 10*3/uL (ref 0–0.7)
Eosinophils Relative: 10 %
HCT: 37 % — ABNORMAL LOW (ref 40.0–52.0)
HEMOGLOBIN: 12.6 g/dL — AB (ref 13.0–18.0)
LYMPHS ABS: 0.7 10*3/uL — AB (ref 1.0–3.6)
Lymphocytes Relative: 25 %
MCH: 31.2 pg (ref 26.0–34.0)
MCHC: 34 g/dL (ref 32.0–36.0)
MCV: 92 fL (ref 80.0–100.0)
MONO ABS: 0.5 10*3/uL (ref 0.2–1.0)
MONOS PCT: 16 %
NEUTROS PCT: 48 %
Neutro Abs: 1.4 10*3/uL (ref 1.4–6.5)
Platelets: 156 10*3/uL (ref 150–440)
RBC: 4.02 MIL/uL — ABNORMAL LOW (ref 4.40–5.90)
RDW: 15.6 % — ABNORMAL HIGH (ref 11.5–14.5)
WBC: 2.9 10*3/uL — ABNORMAL LOW (ref 3.8–10.6)

## 2016-01-03 MED ORDER — ZOLEDRONIC ACID 4 MG/100ML IV SOLN
4.0000 mg | Freq: Once | INTRAVENOUS | Status: AC
  Administered 2016-01-03: 4 mg via INTRAVENOUS
  Filled 2016-01-03: qty 100

## 2016-01-03 NOTE — Progress Notes (Signed)
[place] OFFICE PROGRESS NOTE  Patient Care Team: Madelyn Brunner, MD as PCP - General (Internal Medicine) Druscilla Brownie, MD as Consulting Physician (Dermatology)   SUMMARY OF ONCOLOGIC HISTORY: Oncology History    # April 2014- MULTIPLE MYELOMA [IgG- 2.2gm/dl; 40-50% plasma cell; Cyto-N; FISH- gain of chr.9, 15 & ccnd1/11q13] s/p RVD x6 [sep 2014; BMBx- ~5% plasma cells]; Maint Rev-DEX-Zometa; March 2015-Stopped Dex Cont- Rev-Zometa; July 2016- Rev 31m; NOV 4th  2016-HOLD; [facility] declined BMT.   # Re-START FEB 2017 Rev 150m3 W- on & 1 w OFF.   # Zometa q 50M  # CKD [creat ~1.4; sec MM]; DM     Multiple myeloma in remission ([facility]  10/01/2015 Initial Diagnosis    [place] myel[place] in remission ([facility]      INTERVAL HISTORY:  A pleasant  Alan Alexander patient with above history of multiple myeloma currently on maintenance Revlimid is here for follow-up. Patient restarted Revlimid in February 2017- after he declined bone marrow transplant.   Denies any pain. Denies any swelling in the legs. Denies any nausea vomiting diarrhea or tingling or numbness.    REVIEW OF SYSTEMS:  A complete 10 point review of system is done which is negative except mentioned above/history of present illness.   PAST MEDICAL HISTORY :  Past Medical History:  Diagnosis Date  . Diabetes mellitus without complication (HCC[facility]  . GERD (gastroesophageal reflux disease)   . Hyperlipidemia   . Hypertension   . Multiple myeloma (HCW[facility]  . Obesity   . Thyroid cancer (HCPopponess[facility] [place]  . Thyroid disease     PAST SURGICAL HISTORY :   Past Surgical History:  Procedure Laterality Date  . BONE MARROW BIOPSY  2014  . CATARACT EXTRACTION BILATERAL W/ ANTERIOR VITRECTOMY      FAMILY HISTORY :   Family History  Problem Relation Age of Onset  . Cancer Father [id number]. Diabetes Mother     SOCIAL HISTORY:   Social History  Substance Use Topics  . Smoking status: Never Smoker   . Smokeless tobacco: Never Used  . Alcohol use No    ALLERGIES:  has No Known Allergies.  MEDICATIONS:  Current Outpatient Prescriptions  Medication Sig Dispense Refill  . acyclovir (ZOVIRAX) 400 MG tablet 400 mg 2 (two) times daily.    . Marland KitchenmLODipine (NORVASC) 10 MG tablet Take 10 mg by mouth daily.    . Marland Kitchenspirin EC 81 MG tablet Take 81 mg by mouth daily.    . Marland Kitchentorvastatin (LIPITOR) 20 MG tablet Take 20 mg by mouth daily.    . cloNIDine (CATAPRES) 0.1 MG tablet Take 0.1 mg by mouth 2 (two) times daily.    . Cyanocobalamin (RA VITAMIN B-12 TR) 1000 MCG TBCR Take by mouth.    . lenalidomide (REVLIMID) 15 MG capsule 1 capsule Daily for 3 weeks and then 1 week off 21 capsule 0  . levothyroxine (SYNTHROID) 200 MCG tablet Take 1 tablet by mouth daily.    . Marland Kitchenosartan (COZAAR) 100 MG tablet Take 100 mg by mouth daily.    . Glory RosebushELICA LANCETS [id number] Inject 1 Lancet as directed once daily.    . pioglitazone (ACTOS) 30 MG tablet Take 30 mg by mouth daily.    . potassium chloride SA (K-DUR,KLOR-CON) 20 MEQ tablet   0  . quinapril (ACCUPRIL) 40 MG tablet Take 40 mg by mouth daily.    . sennosides-docusate sodium (SENOKOT-S)  8.6-50 MG tablet Take 2 tablets by mouth daily.     No current facility-administered medications for this visit.    Facility-Administered Medications Ordered in Other Visits  Medication Dose Route Frequency Provider Last Rate Last Dose  . 0.9 %  sodium chloride infusion   Intravenous Continuous Cammie Sickle, MD   Stopped at 12/11/14 1528    PHYSICAL EXAMINATION: ECOG PERFORMANCE STATUS: 0 - Asymptomatic  BP 131/73 (BP Location: Left Arm, Patient Position: Sitting)   Pulse (!) 49   Temp (!) 96.5 F (35.8 C) (Tympanic)   Wt 239 lb 2 oz (108.5 kg)   BMI 34.31 kg/m   Filed Weights   01/03/16 1015  Weight: 239 lb 2 oz (108.5 kg)    GENERAL: Well-nourished well-developed; Alert, no distress and comfortable. He is alone.  No pallor or icterus OROPHARYNX:  no thrush or ulceration; good dentition  NECK: supple, no masses felt LYMPH:  no palpable lymphadenopathy in the cervical, axillary or inguinal regions LUNGS: clear to auscultation and  No wheeze or crackles HEART/CVS: regular rate & rhythm and no murmurs; No lower extremity edema ABDOMEN:abdomen soft, non-tender and normal bowel sounds Musculoskeletal:no cyanosis of digits and no clubbing  PSYCH: alert & oriented x 3 with fluent speech NEURO: no focal motor/sensory deficits SKIN:  no rashes or significant lesions  LABORATORY DATA:  I have reviewed the data as listed    Component Value Date/Time   NA 138 01/03/2016 0931   NA 139 05/01/2014 1322   K 4.2 01/03/2016 0931   K 4.4 05/01/2014 1322   CL 105 01/03/2016 0931   CL 107 05/01/2014 1322   CO2 25 01/03/2016 0931   CO2 27 05/01/2014 1322   GLUCOSE 119 (H) 01/03/2016 0931   GLUCOSE 102 (H) 05/01/2014 1322   BUN 18 01/03/2016 0931   BUN 16 05/01/2014 1322   CREATININE 1.18 01/03/2016 0931   CREATININE 1.48 (H) 05/29/2014 0949   CALCIUM 8.8 (L) 01/03/2016 0931   CALCIUM 9.1 05/01/2014 1322   PROT 7.6 01/03/2016 0931   PROT 7.5 05/01/2014 1322   ALBUMIN 3.6 01/03/2016 0931   ALBUMIN 3.6 05/01/2014 1322   AST 17 01/03/2016 0931   AST 17 05/01/2014 1322   ALT 15 (L) 01/03/2016 0931   ALT 13 (L) 05/01/2014 1322   ALKPHOS 48 01/03/2016 0931   ALKPHOS 47 05/01/2014 1322   BILITOT 0.6 01/03/2016 0931   BILITOT 0.6 05/01/2014 1322   GFRNONAA >60 01/03/2016 0931   GFRNONAA 47 (L) 05/29/2014 0949   GFRAA >60 01/03/2016 0931   GFRAA 55 (L) 05/29/2014 0949    No results found for: SPEP, UPEP  Lab Results  Component Value Date   WBC 2.9 (L) 01/03/2016   NEUTROABS 1.4 01/03/2016   HGB 12.6 (L) 01/03/2016   HCT 37.0 (L) 01/03/2016   MCV 92.0 01/03/2016   PLT 156 01/03/2016      Chemistry      Component Value Date/Time   NA 138 01/03/2016 0931   NA 139 05/01/2014 1322   K 4.2 01/03/2016 0931   K 4.4 05/01/2014 1322    CL 105 01/03/2016 0931   CL 107 05/01/2014 1322   CO2 25 01/03/2016 0931   CO2 27 05/01/2014 1322   BUN 18 01/03/2016 0931   BUN 16 05/01/2014 1322   CREATININE 1.18 01/03/2016 0931   CREATININE 1.48 (H) 05/29/2014 0949      Component Value Date/Time   CALCIUM 8.8 (L) 01/03/2016 9767  CALCIUM 9.1 05/01/2014 1322   ALKPHOS 48 01/03/2016 0931   ALKPHOS 47 05/01/2014 1322   AST 17 01/03/2016 0931   AST 17 05/01/2014 1322   ALT 15 (L) 01/03/2016 0931   ALT 13 (L) 05/01/2014 1322   BILITOT 0.6 01/03/2016 0931   BILITOT 0.6 05/01/2014 1322        ASSESSMENT & PLAN:  Multiple myeloma in remission ([employer]) Multiple myeloma currently on maintenance Revlimid 15 mg 3 w-On & 1 w Off; Patient tolerating maintenance Revlimid very well.   # no concerns for progression. Patient's most recent serum M protein July 2017 negative; serum free kappa/lambda light chain ratio normal.  Labs from today pending.   # Continue Revlimid 15 mg 3 weeks on and one-week off.  # Creatinine is 1.1/ ca 8.8- on  Zometa q 3 months; On calcium and vitamin D. He will be due today. Discussed re: ONJ concerns; none at this time.   #Check monthly CBC CMP;  We'll follow up with me in 3 months/CBC CMP/ Zometa.       Cammie Sickle, MD 01/04/2016 8:42 AM

## 2016-01-03 NOTE — Progress Notes (Signed)
Patient here today for follow up on Multiple myeloma.  Patient states no new concerns today

## 2016-01-03 NOTE — Assessment & Plan Note (Signed)
Multiple myeloma currently on maintenance Revlimid 15 mg 3 w-On & 1 w Off; Patient tolerating maintenance Revlimid very well.   # no concerns for progression. Patient's most recent serum M protein July 2017 negative; serum free kappa/lambda light chain ratio normal.  Labs from today pending.   # Continue Revlimid 15 mg 3 weeks on and one-week off.  # Creatinine is 1.1/ ca 8.8- on  Zometa q 3 months; On calcium and vitamin D. He will be due today. Discussed re: ONJ concerns; none at this time.   #Check monthly CBC CMP;  We'll follow up with me in 3 months/CBC CMP/ Zometa.

## 2016-01-04 LAB — MULTIPLE MYELOMA PANEL, SERUM
Albumin SerPl Elph-Mcnc: 3.4 g/dL (ref 2.9–4.4)
Albumin/Glob SerPl: 1 (ref 0.7–1.7)
Alpha 1: 0.3 g/dL (ref 0.0–0.4)
Alpha2 Glob SerPl Elph-Mcnc: 0.7 g/dL (ref 0.4–1.0)
B-GLOBULIN SERPL ELPH-MCNC: 1.1 g/dL (ref 0.7–1.3)
GAMMA GLOB SERPL ELPH-MCNC: 1.6 g/dL (ref 0.4–1.8)
GLOBULIN, TOTAL: 3.6 g/dL (ref 2.2–3.9)
IgA: 449 mg/dL — ABNORMAL HIGH (ref 61–437)
IgG (Immunoglobin G), Serum: 1542 mg/dL (ref 700–1600)
IgM, Serum: 53 mg/dL (ref 15–143)
Total Protein ELP: 7 g/dL (ref 6.0–8.5)

## 2016-01-04 LAB — KAPPA/LAMBDA LIGHT CHAINS
KAPPA FREE LGHT CHN: 70.1 mg/L — AB (ref 3.3–19.4)
Kappa, lambda light chain ratio: 1.56 (ref 0.26–1.65)
Lambda free light chains: 44.9 mg/L — ABNORMAL HIGH (ref 5.7–26.3)

## 2016-02-02 ENCOUNTER — Inpatient Hospital Stay: Payer: Medicare Other | Attending: Internal Medicine

## 2016-02-02 DIAGNOSIS — C9001 Multiple myeloma in remission: Secondary | ICD-10-CM

## 2016-02-02 DIAGNOSIS — C9000 Multiple myeloma not having achieved remission: Secondary | ICD-10-CM | POA: Insufficient documentation

## 2016-02-02 LAB — CBC WITH DIFFERENTIAL/PLATELET
Basophils Absolute: 0 K/uL (ref 0–0.1)
Basophils Relative: 1 %
Eosinophils Absolute: 0.3 K/uL (ref 0–0.7)
Eosinophils Relative: 10 %
HCT: 36.2 % — ABNORMAL LOW (ref 40.0–52.0)
Hemoglobin: 12.1 g/dL — ABNORMAL LOW (ref 13.0–18.0)
Lymphocytes Relative: 28 %
Lymphs Abs: 0.8 K/uL — ABNORMAL LOW (ref 1.0–3.6)
MCH: 31.1 pg (ref 26.0–34.0)
MCHC: 33.4 g/dL (ref 32.0–36.0)
MCV: 93 fL (ref 80.0–100.0)
Monocytes Absolute: 0.5 K/uL (ref 0.2–1.0)
Monocytes Relative: 18 %
Neutro Abs: 1.3 K/uL — ABNORMAL LOW (ref 1.4–6.5)
Neutrophils Relative %: 43 %
Platelets: 156 K/uL (ref 150–440)
RBC: 3.89 MIL/uL — ABNORMAL LOW (ref 4.40–5.90)
RDW: 15.8 % — ABNORMAL HIGH (ref 11.5–14.5)
WBC: 3 K/uL — ABNORMAL LOW (ref 3.8–10.6)

## 2016-02-02 LAB — COMPREHENSIVE METABOLIC PANEL
ALK PHOS: 54 U/L (ref 38–126)
ALT: 13 U/L — AB (ref 17–63)
AST: 16 U/L (ref 15–41)
Albumin: 3.3 g/dL — ABNORMAL LOW (ref 3.5–5.0)
Anion gap: 4 — ABNORMAL LOW (ref 5–15)
BILIRUBIN TOTAL: 0.7 mg/dL (ref 0.3–1.2)
BUN: 15 mg/dL (ref 6–20)
CALCIUM: 8.4 mg/dL — AB (ref 8.9–10.3)
CHLORIDE: 109 mmol/L (ref 101–111)
CO2: 24 mmol/L (ref 22–32)
CREATININE: 1.23 mg/dL (ref 0.61–1.24)
GFR, EST NON AFRICAN AMERICAN: 57 mL/min — AB (ref >60)
Glucose, Bld: 118 mg/dL — ABNORMAL HIGH (ref 65–99)
Potassium: 3.7 mmol/L (ref 3.5–5.1)
Sodium: 137 mmol/L (ref 135–145)
TOTAL PROTEIN: 7.3 g/dL (ref 6.5–8.1)

## 2016-02-11 ENCOUNTER — Telehealth: Payer: Self-pay | Admitting: *Deleted

## 2016-02-11 NOTE — Telephone Encounter (Signed)
Pt contacted cancer center. Spoke with RN. Needs new rx for revlmid 15 mg daily 1 tablet daily x 21 days.  Rems survey performed.  I explained to patient that this was initiated this morning and faxed to [facility]. I asked the patient to take his patient survey. He reports that he is tolerating the revlmid without any side effects.

## 2016-02-16 ENCOUNTER — Other Ambulatory Visit: Payer: Self-pay | Admitting: *Deleted

## 2016-02-16 DIAGNOSIS — C9000 Multiple myeloma not having achieved remission: Secondary | ICD-10-CM

## 2016-02-16 DIAGNOSIS — C9001 Multiple myeloma in remission: Secondary | ICD-10-CM

## 2016-02-16 MED ORDER — LENALIDOMIDE 15 MG PO CAPS: ORAL_CAPSULE | ORAL | 0 refills | Status: DC

## 2016-03-03 ENCOUNTER — Inpatient Hospital Stay: Payer: Medicare Other | Attending: Internal Medicine

## 2016-03-03 DIAGNOSIS — C9000 Multiple myeloma not having achieved remission: Secondary | ICD-10-CM | POA: Insufficient documentation

## 2016-03-03 DIAGNOSIS — C9001 Multiple myeloma in remission: Secondary | ICD-10-CM

## 2016-03-03 LAB — COMPREHENSIVE METABOLIC PANEL
ALK PHOS: 45 U/L (ref 38–126)
ALT: 12 U/L — ABNORMAL LOW (ref 17–63)
ANION GAP: 4 — AB (ref 5–15)
AST: 16 U/L (ref 15–41)
Albumin: 3.4 g/dL — ABNORMAL LOW (ref 3.5–5.0)
BUN: 15 mg/dL (ref 6–20)
CALCIUM: 8.4 mg/dL — AB (ref 8.9–10.3)
CO2: 26 mmol/L (ref 22–32)
Chloride: 107 mmol/L (ref 101–111)
Creatinine, Ser: 1.28 mg/dL — ABNORMAL HIGH (ref 0.61–1.24)
GFR, EST NON AFRICAN AMERICAN: 54 mL/min — AB (ref >60)
Glucose, Bld: 118 mg/dL — ABNORMAL HIGH (ref 65–99)
Potassium: 4 mmol/L (ref 3.5–5.1)
SODIUM: 137 mmol/L (ref 135–145)
TOTAL PROTEIN: 7.1 g/dL (ref 6.5–8.1)
Total Bilirubin: 0.6 mg/dL (ref 0.3–1.2)

## 2016-03-03 LAB — CBC WITH DIFFERENTIAL/PLATELET
Basophils Absolute: 0 10*3/uL (ref 0–0.1)
Basophils Relative: 0 %
EOS ABS: 0.3 10*3/uL (ref 0–0.7)
EOS PCT: 8 %
HCT: 36 % — ABNORMAL LOW (ref 40.0–52.0)
HEMOGLOBIN: 12 g/dL — AB (ref 13.0–18.0)
LYMPHS ABS: 0.8 10*3/uL — AB (ref 1.0–3.6)
Lymphocytes Relative: 22 %
MCH: 30.7 pg (ref 26.0–34.0)
MCHC: 33.3 g/dL (ref 32.0–36.0)
MCV: 92.3 fL (ref 80.0–100.0)
MONOS PCT: 12 %
Monocytes Absolute: 0.4 10*3/uL (ref 0.2–1.0)
NEUTROS PCT: 58 %
Neutro Abs: 2.1 10*3/uL (ref 1.4–6.5)
Platelets: 192 10*3/uL (ref 150–440)
RBC: 3.9 MIL/uL — ABNORMAL LOW (ref 4.40–5.90)
RDW: 16.2 % — ABNORMAL HIGH (ref 11.5–14.5)
WBC: 3.6 10*3/uL — ABNORMAL LOW (ref 3.8–10.6)

## 2016-03-06 LAB — KAPPA/LAMBDA LIGHT CHAINS
KAPPA, LAMDA LIGHT CHAIN RATIO: 1.61 (ref 0.26–1.65)
Kappa free light chain: 65 mg/L — ABNORMAL HIGH (ref 3.3–19.4)
LAMDA FREE LIGHT CHAINS: 40.3 mg/L — AB (ref 5.7–26.3)

## 2016-03-07 LAB — MULTIPLE MYELOMA PANEL, SERUM
ALBUMIN/GLOB SERPL: 1 (ref 0.7–1.7)
Albumin SerPl Elph-Mcnc: 3.2 g/dL (ref 2.9–4.4)
Alpha 1: 0.2 g/dL (ref 0.0–0.4)
Alpha2 Glob SerPl Elph-Mcnc: 0.6 g/dL (ref 0.4–1.0)
B-Globulin SerPl Elph-Mcnc: 1 g/dL (ref 0.7–1.3)
GAMMA GLOB SERPL ELPH-MCNC: 1.5 g/dL (ref 0.4–1.8)
GLOBULIN, TOTAL: 3.3 g/dL (ref 2.2–3.9)
IGA: 415 mg/dL (ref 61–437)
IGM, SERUM: 48 mg/dL (ref 15–143)
IgG (Immunoglobin G), Serum: 1424 mg/dL (ref 700–1600)
Total Protein ELP: 6.5 g/dL (ref 6.0–8.5)

## 2016-04-05 ENCOUNTER — Inpatient Hospital Stay: Payer: Medicare Other

## 2016-04-05 ENCOUNTER — Inpatient Hospital Stay: Payer: Medicare Other | Attending: Internal Medicine | Admitting: Internal Medicine

## 2016-04-05 VITALS — BP 154/72 | HR 44 | Temp 95.4°F | Wt 238.2 lb

## 2016-04-05 DIAGNOSIS — Z85850 Personal history of malignant neoplasm of thyroid: Secondary | ICD-10-CM | POA: Insufficient documentation

## 2016-04-05 DIAGNOSIS — Z79899 Other long term (current) drug therapy: Secondary | ICD-10-CM | POA: Diagnosis not present

## 2016-04-05 DIAGNOSIS — C9001 Multiple myeloma in remission: Secondary | ICD-10-CM

## 2016-04-05 DIAGNOSIS — I129 Hypertensive chronic kidney disease with stage 1 through stage 4 chronic kidney disease, or unspecified chronic kidney disease: Secondary | ICD-10-CM

## 2016-04-05 DIAGNOSIS — N189 Chronic kidney disease, unspecified: Secondary | ICD-10-CM

## 2016-04-05 DIAGNOSIS — C9000 Multiple myeloma not having achieved remission: Secondary | ICD-10-CM | POA: Insufficient documentation

## 2016-04-05 DIAGNOSIS — E785 Hyperlipidemia, unspecified: Secondary | ICD-10-CM | POA: Diagnosis not present

## 2016-04-05 DIAGNOSIS — E1122 Type 2 diabetes mellitus with diabetic chronic kidney disease: Secondary | ICD-10-CM | POA: Insufficient documentation

## 2016-04-05 DIAGNOSIS — K219 Gastro-esophageal reflux disease without esophagitis: Secondary | ICD-10-CM | POA: Insufficient documentation

## 2016-04-05 DIAGNOSIS — Z7982 Long term (current) use of aspirin: Secondary | ICD-10-CM | POA: Diagnosis not present

## 2016-04-05 LAB — CBC WITH DIFFERENTIAL/PLATELET
BASOS ABS: 0 10*3/uL (ref 0–0.1)
BASOS PCT: 1 %
EOS ABS: 0.2 10*3/uL (ref 0–0.7)
Eosinophils Relative: 7 %
HEMATOCRIT: 36 % — AB (ref 40.0–52.0)
Hemoglobin: 12.1 g/dL — ABNORMAL LOW (ref 13.0–18.0)
Lymphocytes Relative: 28 %
Lymphs Abs: 0.9 10*3/uL — ABNORMAL LOW (ref 1.0–3.6)
MCH: 31.3 pg (ref 26.0–34.0)
MCHC: 33.7 g/dL (ref 32.0–36.0)
MCV: 92.9 fL (ref 80.0–100.0)
MONO ABS: 0.6 10*3/uL (ref 0.2–1.0)
MONOS PCT: 18 %
NEUTROS ABS: 1.6 10*3/uL (ref 1.4–6.5)
Neutrophils Relative %: 46 %
PLATELETS: 167 10*3/uL (ref 150–440)
RBC: 3.87 MIL/uL — ABNORMAL LOW (ref 4.40–5.90)
RDW: 15.9 % — AB (ref 11.5–14.5)
WBC: 3.3 10*3/uL — ABNORMAL LOW (ref 3.8–10.6)

## 2016-04-05 LAB — COMPREHENSIVE METABOLIC PANEL
ALBUMIN: 3.5 g/dL (ref 3.5–5.0)
ALT: 14 U/L — ABNORMAL LOW (ref 17–63)
ANION GAP: 6 (ref 5–15)
AST: 16 U/L (ref 15–41)
Alkaline Phosphatase: 52 U/L (ref 38–126)
BUN: 19 mg/dL (ref 6–20)
CHLORIDE: 107 mmol/L (ref 101–111)
CO2: 25 mmol/L (ref 22–32)
Calcium: 8.8 mg/dL — ABNORMAL LOW (ref 8.9–10.3)
Creatinine, Ser: 1.31 mg/dL — ABNORMAL HIGH (ref 0.61–1.24)
GFR calc Af Amer: >60 mL/min (ref >60)
GFR calc non Af Amer: 53 mL/min — ABNORMAL LOW (ref >60)
GLUCOSE: 98 mg/dL (ref 65–99)
POTASSIUM: 4.2 mmol/L (ref 3.5–5.1)
SODIUM: 138 mmol/L (ref 135–145)
TOTAL PROTEIN: 7.5 g/dL (ref 6.5–8.1)
Total Bilirubin: 0.6 mg/dL (ref 0.3–1.2)

## 2016-04-05 MED ORDER — SODIUM CHLORIDE 0.9 % IV SOLN
INTRAVENOUS | Status: DC
  Administered 2016-04-05: 15:00:00 via INTRAVENOUS
  Filled 2016-04-05: qty 1000

## 2016-04-05 MED ORDER — ZOLEDRONIC ACID 4 MG/5ML IV CONC
3.0000 mg | Freq: Once | INTRAVENOUS | Status: AC
  Administered 2016-04-05: 3 mg via INTRAVENOUS
  Filled 2016-04-05: qty 3.75

## 2016-04-05 NOTE — Progress Notes (Signed)
Patient here today for follow up.  Patient states no new concerns today  

## 2016-04-05 NOTE — Progress Notes (Signed)
[place] OFFICE PROGRESS NOTE  Patient Care Team: Madelyn Brunner, MD as PCP - General (Internal Medicine) Druscilla Brownie, MD as Consulting Physician (Dermatology)   SUMMARY OF ONCOLOGIC HISTORY: Oncology History    # April 2014- MULTIPLE MYELOMA [IgG- 2.2gm/dl; 40-50% plasma cell; Cyto-N; FISH- gain of chr.9, 15 & ccnd1/11q13] s/p RVD x6 [sep 2014; BMBx- ~5% plasma cells]; Maint Rev-DEX-Zometa; March 2015-Stopped Dex Cont- Rev-Zometa; July 2016- Rev 13m; NOV 4th  2016-HOLD; [facility]Dr[facility]]; declined BMT.   # Re-START FEB 2017 Rev 155m3 W- on & 1 w OFF.   # Zometa q 64M  # CKD [creat ~1.4; sec MM]; DM     [facility]oma in remission ([facility]  10/01/2015 Initial Diagnosis    [facility] in [facility] ([facility]      INTERVAL HISTORY:  A pleasant  Alan Alexander patient with above history of multiple myeloma currently on maintenance Revlimid is here for follow-up. Patient restarted Revlimid in February 2017- after he declined bone marrow transplant.  Denies any bone pain. Appetite good. No weight loss. No joint pain. Denies any swelling in the legs. Denies any nausea vomiting diarrhea or tingling or numbness.    REVIEW OF SYSTEMS:  A complete 10 point review of system is done which is negative except mentioned above/history of present illness.   PAST MEDICAL HISTORY :  Past Medical History:  Diagnosis Date  . Diabetes mellitus without complication ([facility]  . GERD (gastroesophageal reflux disease)   . Hyperlipidemia   . Hypertension   . Multiple myeloma ([facility]  . Obesity   . Thyroid cancer ([facility]  . Thyroid disease     PAST SURGICAL HISTORY :   Past Surgical History:  Procedure Laterality Date  . BONE MARROW BIOPSY  2014  . CATARACT EXTRACTION BILATERAL W/ ANTERIOR VITRECTOMY      FAMILY HISTORY :   Family History  Problem Relation Age of Onset  . Cancer Father [id number]. Diabetes Mother     SOCIAL HISTORY:   Social History   Substance Use Topics  . Smoking status: Never Smoker  . Smokeless tobacco: Never Used  . Alcohol use No    ALLERGIES:  has No Known Allergies.  MEDICATIONS:  Current Outpatient Prescriptions  Medication Sig Dispense Refill  . acyclovir (ZOVIRAX) 400 MG tablet 400 mg 2 (two) times daily.    . Marland KitchenmLODipine (NORVASC) 10 MG tablet Take 10 mg by mouth daily.    . Marland Kitchenspirin EC 81 MG tablet Take 81 mg by mouth daily.    . Marland Kitchentorvastatin (LIPITOR) 20 MG tablet Take 20 mg by mouth daily.    . cloNIDine (CATAPRES) 0.1 MG tablet Take 0.1 mg by mouth 2 (two) times daily.    . Cyanocobalamin (RA VITAMIN B-12 TR) 1000 MCG TBCR Take by mouth.    . lenalidomide (REVLIMID) 15 MG capsule 1 capsule Daily for 3 weeks and then 1 week off 21 capsule 0  . levothyroxine (SYNTHROID) 200 MCG tablet Take 1 tablet by mouth daily.    . Marland Kitchenosartan (COZAAR) 100 MG tablet Take 100 mg by mouth daily.    . Glory RosebushELICA LANCETS [id number] Inject 1 Lancet as directed once daily.    . pioglitazone (ACTOS) 30 MG tablet Take 30 mg by mouth daily.    . potassium chloride SA (K-DUR,KLOR-CON) 20 MEQ tablet Take 20 mEq by mouth 2 (two) times daily.   0  . quinapril (  ACCUPRIL) 40 MG tablet Take 40 mg by mouth daily.    . sennosides-docusate sodium (SENOKOT-S) 8.6-50 MG tablet Take 2 tablets by mouth daily.     No current facility-administered medications for this visit.    Facility-Administered Medications Ordered in Other Visits  Medication Dose Route Frequency Provider Last Rate Last Dose  . 0.9 %  sodium chloride infusion   Intravenous Continuous Cammie Sickle, MD   Stopped at 12/11/14 1528    PHYSICAL EXAMINATION: ECOG PERFORMANCE STATUS: 0 - Asymptomatic  BP (!) 154/72 (BP Location: Left Arm, Patient Position: Sitting)   Pulse (!) 44   Temp (!) 95.4 F (35.2 C) (Tympanic)   Wt 238 lb 4 oz (108.1 kg)   BMI 34.19 kg/m   Filed Weights   04/05/16 1335  Weight: 238 lb 4 oz (108.1 kg)    GENERAL:  Well-nourished well-developed; Alert, no distress and comfortable. He is alone.  No pallor or icterus OROPHARYNX: no thrush or ulceration; good dentition  NECK: supple, no masses felt LYMPH:  no palpable lymphadenopathy in the cervical, axillary or inguinal regions LUNGS: clear to auscultation and  No wheeze or crackles HEART/CVS: regular rate & rhythm and no murmurs; No lower extremity edema ABDOMEN:abdomen soft, non-tender and normal bowel sounds Musculoskeletal:no cyanosis of digits and no clubbing  PSYCH: alert & oriented x 3 with fluent speech NEURO: no focal motor/sensory deficits SKIN:  no rashes or significant lesions  LABORATORY DATA:  I have reviewed the data as listed    Component Value Date/Time   NA 138 04/05/2016 1300   NA 139 05/01/2014 1322   K 4.2 04/05/2016 1300   K 4.4 05/01/2014 1322   CL 107 04/05/2016 1300   CL 107 05/01/2014 1322   CO2 25 04/05/2016 1300   CO2 27 05/01/2014 1322   GLUCOSE 98 04/05/2016 1300   GLUCOSE 102 (H) 05/01/2014 1322   BUN 19 04/05/2016 1300   BUN 16 05/01/2014 1322   CREATININE 1.31 (H) 04/05/2016 1300   CREATININE 1.48 (H) 05/29/2014 0949   CALCIUM 8.8 (L) 04/05/2016 1300   CALCIUM 9.1 05/01/2014 1322   PROT 7.5 04/05/2016 1300   PROT 7.5 05/01/2014 1322   ALBUMIN 3.5 04/05/2016 1300   ALBUMIN 3.6 05/01/2014 1322   AST 16 04/05/2016 1300   AST 17 05/01/2014 1322   ALT 14 (L) 04/05/2016 1300   ALT 13 (L) 05/01/2014 1322   ALKPHOS 52 04/05/2016 1300   ALKPHOS 47 05/01/2014 1322   BILITOT 0.6 04/05/2016 1300   BILITOT 0.6 05/01/2014 1322   GFRNONAA 53 (L) 04/05/2016 1300   GFRNONAA 47 (L) 05/29/2014 0949   GFRAA >60 04/05/2016 1300   GFRAA 55 (L) 05/29/2014 0949    No results found for: SPEP, UPEP  Lab Results  Component Value Date   WBC 3.3 (L) 04/05/2016   NEUTROABS 1.6 04/05/2016   HGB 12.1 (L) 04/05/2016   HCT 36.0 (L) 04/05/2016   MCV 92.9 04/05/2016   PLT 167 04/05/2016      Chemistry      Component  Value Date/Time   NA 138 04/05/2016 1300   NA 139 05/01/2014 1322   K 4.2 04/05/2016 1300   K 4.4 05/01/2014 1322   CL 107 04/05/2016 1300   CL 107 05/01/2014 1322   CO2 25 04/05/2016 1300   CO2 27 05/01/2014 1322   BUN 19 04/05/2016 1300   BUN 16 05/01/2014 1322   CREATININE 1.31 (H) 04/05/2016 1300   CREATININE 1.48 (  H) 05/29/2014 0949      Component Value Date/Time   CALCIUM 8.8 (L) 04/05/2016 1300   CALCIUM 9.1 05/01/2014 1322   ALKPHOS 52 04/05/2016 1300   ALKPHOS 47 05/01/2014 1322   AST 16 04/05/2016 1300   AST 17 05/01/2014 1322   ALT 14 (L) 04/05/2016 1300   ALT 13 (L) 05/01/2014 1322   BILITOT 0.6 04/05/2016 1300   BILITOT 0.6 05/01/2014 1322        ASSESSMENT & PLAN:  Multiple myeloma in remission ([place]) Multiple myeloma currently on maintenance Revlimid 15 mg 3 w-On & 1 w Off; Patient tolerating maintenance Revlimid very well.   # no concerns for progression. Patient's most recent serum M protein JAN 2018 negative; serum free kappa/lambda light chain ratio normal. Labs today reviewed;  acceptable for treatment [Zometa] today. Myeloma Labs from today pending.   # Continue Revlimid 15 mg 3 weeks on and one-week off.  # Creatinine is 1.3/ ca 8.9- on  Zometa q 3 months; On calcium and vitamin D. He will be due today. Discussed re: ONJ concerns; none at this time.   #Check monthly CBC CMP;  We'll follow up with me in 3 months/CBC CMP/ Zometa.       Cammie Sickle, MD 04/05/2016 2:08 PM

## 2016-04-05 NOTE — Assessment & Plan Note (Signed)
Multiple myeloma currently on maintenance Revlimid 15 mg 3 w-On & 1 w Off; Patient tolerating maintenance Revlimid very well.   # no concerns for progression. Patient's most recent serum M protein JAN 2018 negative; serum free kappa/lambda light chain ratio normal. Labs today reviewed;  acceptable for treatment [Zometa] today. Myeloma Labs from today pending.   # Continue Revlimid 15 mg 3 weeks on and one-week off.  # Creatinine is 1.3/ ca 8.9- on  Zometa q 3 months; On calcium and vitamin D. He will be due today. Discussed re: ONJ concerns; none at this time.   #Check monthly CBC CMP;  We'll follow up with me in 3 months/CBC CMP/ Zometa.  

## 2016-04-24 ENCOUNTER — Ambulatory Visit
Admission: RE | Admit: 2016-04-24 | Discharge: 2016-04-24 | Disposition: A | Discharge status: Home / Self Care | Payer: Medicare Other | Source: Ambulatory Visit | Attending: Internal Medicine | Admitting: Internal Medicine

## 2016-04-24 ENCOUNTER — Telehealth: Payer: Self-pay | Admitting: *Deleted

## 2016-04-24 DIAGNOSIS — I8391 Asymptomatic varicose veins of right lower extremity: Secondary | ICD-10-CM | POA: Insufficient documentation

## 2016-04-24 DIAGNOSIS — R229 Localized swelling, mass and lump, unspecified: Secondary | ICD-10-CM | POA: Insufficient documentation

## 2016-04-24 DIAGNOSIS — C9001 Multiple myeloma in remission: Secondary | ICD-10-CM

## 2016-04-24 NOTE — Telephone Encounter (Signed)
Called to report that he has some "knots" in his right leg, concerned he may have blood clots. A little swollen in the lower right one at back above ankle and the other on the front at shin. Not painful but a little sore, denies homans sign. Please advise

## 2016-04-24 NOTE — Telephone Encounter (Signed)
Per Dr Raynelle Dick RLE doppler. Order entered and message sent to scheduling. Patient informed and in agreement with procedure

## 2016-04-24 NOTE — Telephone Encounter (Addendum)
Spoke with Dr. Rogue Bussing. Patient does not have a dvt. Per md order, contacted pt-discussed results with patient. No interventions at this time. If patient desires, he may use compression stockings. If symptoms persist, then pt's pcp could consider a vascular consult for pt.

## 2016-05-03 ENCOUNTER — Inpatient Hospital Stay: Payer: Medicare Other | Attending: Internal Medicine

## 2016-05-03 DIAGNOSIS — C9001 Multiple myeloma in remission: Secondary | ICD-10-CM | POA: Insufficient documentation

## 2016-05-03 LAB — CBC WITH DIFFERENTIAL/PLATELET
BASOS PCT: 1 %
Basophils Absolute: 0 10*3/uL (ref 0–0.1)
EOS ABS: 0.2 10*3/uL (ref 0–0.7)
EOS PCT: 6 %
HCT: 35.3 % — ABNORMAL LOW (ref 40.0–52.0)
Hemoglobin: 12 g/dL — ABNORMAL LOW (ref 13.0–18.0)
Lymphocytes Relative: 32 %
Lymphs Abs: 0.8 10*3/uL — ABNORMAL LOW (ref 1.0–3.6)
MCH: 31.6 pg (ref 26.0–34.0)
MCHC: 34.1 g/dL (ref 32.0–36.0)
MCV: 92.7 fL (ref 80.0–100.0)
MONO ABS: 0.3 10*3/uL (ref 0.2–1.0)
MONOS PCT: 10 %
NEUTROS PCT: 51 %
Neutro Abs: 1.3 10*3/uL — ABNORMAL LOW (ref 1.4–6.5)
PLATELETS: 202 10*3/uL (ref 150–440)
RBC: 3.81 MIL/uL — ABNORMAL LOW (ref 4.40–5.90)
RDW: 16.3 % — AB (ref 11.5–14.5)
WBC: 2.5 10*3/uL — ABNORMAL LOW (ref 3.8–10.6)

## 2016-05-03 LAB — BASIC METABOLIC PANEL
Anion gap: 3 — ABNORMAL LOW (ref 5–15)
BUN: 18 mg/dL (ref 6–20)
CALCIUM: 8.5 mg/dL — AB (ref 8.9–10.3)
CO2: 27 mmol/L (ref 22–32)
Chloride: 107 mmol/L (ref 101–111)
Creatinine, Ser: 1.37 mg/dL — ABNORMAL HIGH (ref 0.61–1.24)
GFR calc Af Amer: 58 mL/min — ABNORMAL LOW (ref >60)
GFR calc non Af Amer: 50 mL/min — ABNORMAL LOW (ref >60)
Glucose, Bld: 142 mg/dL — ABNORMAL HIGH (ref 65–99)
Potassium: 3.8 mmol/L (ref 3.5–5.1)
SODIUM: 137 mmol/L (ref 135–145)

## 2016-05-31 ENCOUNTER — Inpatient Hospital Stay: Payer: Medicare Other | Attending: Internal Medicine

## 2016-05-31 DIAGNOSIS — C9001 Multiple myeloma in remission: Secondary | ICD-10-CM | POA: Diagnosis present

## 2016-05-31 LAB — CBC WITH DIFFERENTIAL/PLATELET
BASOS PCT: 1 %
Basophils Absolute: 0 10*3/uL (ref 0–0.1)
EOS ABS: 0.2 10*3/uL (ref 0–0.7)
EOS PCT: 7 %
HEMATOCRIT: 36.4 % — AB (ref 40.0–52.0)
Hemoglobin: 12.3 g/dL — ABNORMAL LOW (ref 13.0–18.0)
Lymphocytes Relative: 30 %
Lymphs Abs: 0.8 10*3/uL — ABNORMAL LOW (ref 1.0–3.6)
MCH: 31.5 pg (ref 26.0–34.0)
MCHC: 33.9 g/dL (ref 32.0–36.0)
MCV: 92.9 fL (ref 80.0–100.0)
MONO ABS: 0.5 10*3/uL (ref 0.2–1.0)
MONOS PCT: 19 %
NEUTROS ABS: 1.2 10*3/uL — AB (ref 1.4–6.5)
Neutrophils Relative %: 43 %
PLATELETS: 195 10*3/uL (ref 150–440)
RBC: 3.92 MIL/uL — ABNORMAL LOW (ref 4.40–5.90)
RDW: 16 % — AB (ref 11.5–14.5)
WBC: 2.7 10*3/uL — ABNORMAL LOW (ref 3.8–10.6)

## 2016-05-31 LAB — BASIC METABOLIC PANEL
ANION GAP: 4 — AB (ref 5–15)
BUN: 13 mg/dL (ref 6–20)
CALCIUM: 9 mg/dL (ref 8.9–10.3)
CO2: 26 mmol/L (ref 22–32)
Chloride: 107 mmol/L (ref 101–111)
Creatinine, Ser: 1.24 mg/dL (ref 0.61–1.24)
GFR, EST NON AFRICAN AMERICAN: 56 mL/min — AB (ref >60)
GLUCOSE: 116 mg/dL — AB (ref 65–99)
Potassium: 4 mmol/L (ref 3.5–5.1)
SODIUM: 137 mmol/L (ref 135–145)

## 2016-06-28 ENCOUNTER — Inpatient Hospital Stay: Payer: Medicare Other

## 2016-06-28 ENCOUNTER — Inpatient Hospital Stay (HOSPITAL_BASED_OUTPATIENT_CLINIC_OR_DEPARTMENT_OTHER): Payer: Medicare Other | Admitting: Internal Medicine

## 2016-06-28 ENCOUNTER — Inpatient Hospital Stay: Payer: Medicare Other | Attending: Internal Medicine

## 2016-06-28 VITALS — BP 154/69 | HR 50 | Temp 97.6°F | Resp 18 | Wt 239.0 lb

## 2016-06-28 DIAGNOSIS — C9001 Multiple myeloma in remission: Secondary | ICD-10-CM

## 2016-06-28 DIAGNOSIS — K219 Gastro-esophageal reflux disease without esophagitis: Secondary | ICD-10-CM | POA: Diagnosis not present

## 2016-06-28 DIAGNOSIS — E079 Disorder of thyroid, unspecified: Secondary | ICD-10-CM | POA: Insufficient documentation

## 2016-06-28 DIAGNOSIS — I129 Hypertensive chronic kidney disease with stage 1 through stage 4 chronic kidney disease, or unspecified chronic kidney disease: Secondary | ICD-10-CM | POA: Insufficient documentation

## 2016-06-28 DIAGNOSIS — E1122 Type 2 diabetes mellitus with diabetic chronic kidney disease: Secondary | ICD-10-CM

## 2016-06-28 DIAGNOSIS — N189 Chronic kidney disease, unspecified: Secondary | ICD-10-CM

## 2016-06-28 DIAGNOSIS — Z7982 Long term (current) use of aspirin: Secondary | ICD-10-CM | POA: Diagnosis not present

## 2016-06-28 DIAGNOSIS — E785 Hyperlipidemia, unspecified: Secondary | ICD-10-CM | POA: Diagnosis not present

## 2016-06-28 DIAGNOSIS — Z79899 Other long term (current) drug therapy: Secondary | ICD-10-CM

## 2016-06-28 DIAGNOSIS — E669 Obesity, unspecified: Secondary | ICD-10-CM

## 2016-06-28 LAB — CBC WITH DIFFERENTIAL/PLATELET
Basophils Absolute: 0 10*3/uL (ref 0–0.1)
Basophils Relative: 1 %
Eosinophils Absolute: 0.3 10*3/uL (ref 0–0.7)
Eosinophils Relative: 8 %
HEMATOCRIT: 35.5 % — AB (ref 40.0–52.0)
HEMOGLOBIN: 12.1 g/dL — AB (ref 13.0–18.0)
LYMPHS PCT: 25 %
Lymphs Abs: 0.9 10*3/uL — ABNORMAL LOW (ref 1.0–3.6)
MCH: 31.7 pg (ref 26.0–34.0)
MCHC: 34.1 g/dL (ref 32.0–36.0)
MCV: 92.9 fL (ref 80.0–100.0)
MONOS PCT: 16 %
Monocytes Absolute: 0.5 10*3/uL (ref 0.2–1.0)
NEUTROS ABS: 1.7 10*3/uL (ref 1.4–6.5)
NEUTROS PCT: 50 %
Platelets: 180 10*3/uL (ref 150–440)
RBC: 3.83 MIL/uL — AB (ref 4.40–5.90)
RDW: 16.3 % — ABNORMAL HIGH (ref 11.5–14.5)
WBC: 3.4 10*3/uL — ABNORMAL LOW (ref 3.8–10.6)

## 2016-06-28 LAB — COMPREHENSIVE METABOLIC PANEL
ALK PHOS: 56 U/L (ref 38–126)
ALT: 15 U/L — AB (ref 17–63)
AST: 18 U/L (ref 15–41)
Albumin: 3.4 g/dL — ABNORMAL LOW (ref 3.5–5.0)
Anion gap: 3 — ABNORMAL LOW (ref 5–15)
BILIRUBIN TOTAL: 0.6 mg/dL (ref 0.3–1.2)
BUN: 14 mg/dL (ref 6–20)
CALCIUM: 8.8 mg/dL — AB (ref 8.9–10.3)
CO2: 26 mmol/L (ref 22–32)
CREATININE: 1.2 mg/dL (ref 0.61–1.24)
Chloride: 107 mmol/L (ref 101–111)
GFR calc Af Amer: >60 mL/min (ref >60)
GFR, EST NON AFRICAN AMERICAN: 59 mL/min — AB (ref >60)
Glucose, Bld: 116 mg/dL — ABNORMAL HIGH (ref 65–99)
Potassium: 3.8 mmol/L (ref 3.5–5.1)
SODIUM: 136 mmol/L (ref 135–145)
Total Protein: 7 g/dL (ref 6.5–8.1)

## 2016-06-28 MED ORDER — SODIUM CHLORIDE 0.9 % IV SOLN
Freq: Once | INTRAVENOUS | Status: AC
  Administered 2016-06-28: 15:00:00 via INTRAVENOUS
  Filled 2016-06-28: qty 1000

## 2016-06-28 MED ORDER — ZOLEDRONIC ACID 4 MG/100ML IV SOLN
3.0000 mg | Freq: Once | INTRAVENOUS | Status: AC
  Administered 2016-06-28: 3 mg via INTRAVENOUS
  Filled 2016-06-28: qty 75

## 2016-06-28 NOTE — Progress Notes (Signed)
[place] OFFICE PROGRESS NOTE  Patient Care Team: Madelyn Brunner, MD as PCP - General (Internal Medicine) Druscilla Brownie, MD as Consulting Physician (Dermatology)   SUMMARY OF ONCOLOGIC HISTORY: Oncology History    # April 2014- MULTIPLE MYELOMA [IgG- 2.2gm/dl; 40-50% plasma cell; Cyto-N; FISH- gain of chr.9, 15 & ccnd1/11q13] s/p RVD x6 [sep 2014; BMBx- ~5% plasma cells]; Maint Rev-DEX-Zometa; March 2015-Stopped Dex Cont- Rev-Zometa; July 2016- Rev 45m; NOV 4th  2016-HOLD; [facility] declined BMT.   # Re-START FEB 2017 Rev 12m3 W- on & 1 w OFF.   # Zometa q 59M  # CKD [creat ~1.4; sec MM]; DM     Multiple myeloma in remission ([facility]  10/01/2015 Initial Diagnosis    [facility] ([facility]      INTERVAL HISTORY:  A pleasant  Alan Alexander patient with above history of multiple myeloma currently on maintenance Revlimid is here for follow-up.   Denies any skin rash or nausea vomiting or diarrhea. Denies any bone pain. Appetite good. No weight loss. No joint pain. Denies any swelling in the legs. Denies any tingling or numbness.    REVIEW OF SYSTEMS:  A complete 10 point review of system is done which is negative except mentioned above/history of present illness.   PAST MEDICAL HISTORY :  Past Medical History:  Diagnosis Date  . Diabetes mellitus without complication ([facility]  . GERD (gastroesophageal reflux disease)   . Hyperlipidemia   . Hypertension   . Multiple myeloma ([facility]  . Obesity   . Thyroid cancer ([facility] [place]  . Thyroid disease     PAST SURGICAL HISTORY :   Past Surgical History:  Procedure Laterality Date  . BONE MARROW BIOPSY  2014  . CATARACT EXTRACTION BILATERAL W/ ANTERIOR VITRECTOMY      FAMILY HISTORY :   Family History  Problem Relation Age of Onset  . Cancer Father [id number]. Diabetes Mother     SOCIAL HISTORY:   Social History  Substance Use Topics  . Smoking status: Never Smoker  .  Smokeless tobacco: Never Used  . Alcohol use No    ALLERGIES:  has No Known Allergies.  MEDICATIONS:  Current Outpatient Prescriptions  Medication Sig Dispense Refill  . acyclovir (ZOVIRAX) 400 MG tablet 400 mg 2 (two) times daily.    . Marland KitchenmLODipine (NORVASC) 10 MG tablet Take 10 mg by mouth daily.    . Marland Kitchenspirin EC 81 MG tablet Take 81 mg by mouth daily.    . Marland Kitchentorvastatin (LIPITOR) 20 MG tablet Take 20 mg by mouth daily.    . cloNIDine (CATAPRES) 0.1 MG tablet Take 0.1 mg by mouth 2 (two) times daily.    . Cyanocobalamin (RA VITAMIN B-12 TR) 1000 MCG TBCR Take by mouth.    . lenalidomide (REVLIMID) 15 MG capsule 1 capsule Daily for 3 weeks and then 1 week off 21 capsule 0  . levothyroxine (SYNTHROID) 200 MCG tablet Take 1 tablet by mouth daily.    . Marland Kitchenosartan (COZAAR) 100 MG tablet Take 100 mg by mouth daily.    . Glory RosebushELICA LANCETS [id number] Inject 1 Lancet as directed once daily.    . pioglitazone (ACTOS) 30 MG tablet Take 30 mg by mouth daily.    . potassium chloride SA (K-DUR,KLOR-CON) 20 MEQ tablet Take 20 mEq by mouth 2 (two) times daily.   0  . quinapril (ACCUPRIL) 40 MG tablet Take 40  mg by mouth daily.    . sennosides-docusate sodium (SENOKOT-S) 8.6-50 MG tablet Take 2 tablets by mouth daily.     No current facility-administered medications for this visit.    Facility-Administered Medications Ordered in Other Visits  Medication Dose Route Frequency Provider Last Rate Last Dose  . 0.9 %  sodium chloride infusion   Intravenous Continuous Cammie Sickle, MD   Stopped at 12/11/14 1528    PHYSICAL EXAMINATION: ECOG PERFORMANCE STATUS: 0 - Asymptomatic  BP (!) 154/69 (BP Location: Left Arm, Patient Position: Sitting)   Pulse (!) 50   Temp 97.6 F (36.4 C) (Tympanic)   Resp 18   Wt 239 lb (108.4 kg)   BMI 34.29 kg/m   Filed Weights   06/28/16 1411  Weight: 239 lb (108.4 kg)    GENERAL: Well-nourished well-developed; Alert, no distress and comfortable. He is  alone.  No pallor or icterus OROPHARYNX: no thrush or ulceration; good dentition  NECK: supple, no masses felt LYMPH:  no palpable lymphadenopathy in the cervical, axillary or inguinal regions LUNGS: clear to auscultation and  No wheeze or crackles HEART/CVS: regular rate & rhythm and no murmurs; No lower extremity edema ABDOMEN:abdomen soft, non-tender and normal bowel sounds Musculoskeletal:no cyanosis of digits and no clubbing  PSYCH: alert & oriented x 3 with fluent speech NEURO: no focal motor/sensory deficits SKIN:  no rashes or significant lesions  LABORATORY DATA:  I have reviewed the data as listed    Component Value Date/Time   NA 136 06/28/2016 1345   NA 139 05/01/2014 1322   K 3.8 06/28/2016 1345   K 4.4 05/01/2014 1322   CL 107 06/28/2016 1345   CL 107 05/01/2014 1322   CO2 26 06/28/2016 1345   CO2 27 05/01/2014 1322   GLUCOSE 116 (H) 06/28/2016 1345   GLUCOSE 102 (H) 05/01/2014 1322   BUN 14 06/28/2016 1345   BUN 16 05/01/2014 1322   CREATININE 1.20 06/28/2016 1345   CREATININE 1.48 (H) 05/29/2014 0949   CALCIUM 8.8 (L) 06/28/2016 1345   CALCIUM 9.1 05/01/2014 1322   PROT 7.0 06/28/2016 1345   PROT 7.5 05/01/2014 1322   ALBUMIN 3.4 (L) 06/28/2016 1345   ALBUMIN 3.6 05/01/2014 1322   AST 18 06/28/2016 1345   AST 17 05/01/2014 1322   ALT 15 (L) 06/28/2016 1345   ALT 13 (L) 05/01/2014 1322   ALKPHOS 56 06/28/2016 1345   ALKPHOS 47 05/01/2014 1322   BILITOT 0.6 06/28/2016 1345   BILITOT 0.6 05/01/2014 1322   GFRNONAA 59 (L) 06/28/2016 1345   GFRNONAA 47 (L) 05/29/2014 0949   GFRAA >60 06/28/2016 1345   GFRAA 55 (L) 05/29/2014 0949    No results found for: SPEP, UPEP  Lab Results  Component Value Date   WBC 3.4 (L) 06/28/2016   NEUTROABS 1.7 06/28/2016   HGB 12.1 (L) 06/28/2016   HCT 35.5 (L) 06/28/2016   MCV 92.9 06/28/2016   PLT 180 06/28/2016      Chemistry      Component Value Date/Time   NA 136 06/28/2016 1345   NA 139 05/01/2014 1322    K 3.8 06/28/2016 1345   K 4.4 05/01/2014 1322   CL 107 06/28/2016 1345   CL 107 05/01/2014 1322   CO2 26 06/28/2016 1345   CO2 27 05/01/2014 1322   BUN 14 06/28/2016 1345   BUN 16 05/01/2014 1322   CREATININE 1.20 06/28/2016 1345   CREATININE 1.48 (H) 05/29/2014 1443  Component Value Date/Time   CALCIUM 8.8 (L) 06/28/2016 1345   CALCIUM 9.1 05/01/2014 1322   ALKPHOS 56 06/28/2016 1345   ALKPHOS 47 05/01/2014 1322   AST 18 06/28/2016 1345   AST 17 05/01/2014 1322   ALT 15 (L) 06/28/2016 1345   ALT 13 (L) 05/01/2014 1322   BILITOT 0.6 06/28/2016 1345   BILITOT 0.6 05/01/2014 1322        ASSESSMENT & PLAN:  Multiple myeloma in remission ([place]) Multiple myeloma currently on maintenance Revlimid 15 mg 3 w-On & 1 w Off; Patient tolerating maintenance Revlimid very well; no side effects noted.   # no concerns for progression. Patient's most recent serum M protein JAN 2018 negative; serum free kappa/lambda light chain ratio normal. Labs today reviewed;  acceptable for treatment [Zometa] today. Myeloma Labs from today pending. Continue Revlimid 15 mg 3 weeks on and one-week off.  # Creatinine is 1.2/ ca 8.8- on  Zometa q 3 months; On calcium and vitamin D. He will be due today. Discussed re: ONJ concerns; none at this time.   #Check monthly CBC CMP;  We'll follow up with me in 3 months/CBC CMP/ Zometa.       Cammie Sickle, MD 06/28/2016 7:19 PM

## 2016-06-28 NOTE — Assessment & Plan Note (Signed)
Multiple myeloma currently on maintenance Revlimid 15 mg 3 w-On & 1 w Off; Patient tolerating maintenance Revlimid very well; no side effects noted.   # no concerns for progression. Patient's most recent serum M protein JAN 2018 negative; serum free kappa/lambda light chain ratio normal. Labs today reviewed;  acceptable for treatment [Zometa] today. Myeloma Labs from today pending. Continue Revlimid 15 mg 3 weeks on and one-week off.  # Creatinine is 1.2/ ca 8.8- on  Zometa q 3 months; On calcium and vitamin D. He will be due today. Discussed re: ONJ concerns; none at this time.   #Check monthly CBC CMP;  We'll follow up with me in 3 months/CBC CMP/ Zometa.

## 2016-06-29 LAB — KAPPA/LAMBDA LIGHT CHAINS
Kappa free light chain: 64.2 mg/L — ABNORMAL HIGH (ref 3.3–19.4)
Kappa, lambda light chain ratio: 1.54 (ref 0.26–1.65)
Lambda free light chains: 41.7 mg/L — ABNORMAL HIGH (ref 5.7–26.3)

## 2016-06-30 ENCOUNTER — Other Ambulatory Visit: Payer: Self-pay | Admitting: *Deleted

## 2016-06-30 DIAGNOSIS — C9000 Multiple myeloma not having achieved remission: Secondary | ICD-10-CM

## 2016-06-30 DIAGNOSIS — C9001 Multiple myeloma in remission: Secondary | ICD-10-CM

## 2016-06-30 LAB — MULTIPLE MYELOMA PANEL, SERUM
ALBUMIN SERPL ELPH-MCNC: 3 g/dL (ref 2.9–4.4)
ALPHA 1: 0.2 g/dL (ref 0.0–0.4)
Albumin/Glob SerPl: 1 (ref 0.7–1.7)
Alpha2 Glob SerPl Elph-Mcnc: 0.6 g/dL (ref 0.4–1.0)
B-GLOBULIN SERPL ELPH-MCNC: 0.9 g/dL (ref 0.7–1.3)
GAMMA GLOB SERPL ELPH-MCNC: 1.4 g/dL (ref 0.4–1.8)
GLOBULIN, TOTAL: 3.2 g/dL (ref 2.2–3.9)
IgA: 417 mg/dL (ref 61–437)
IgG (Immunoglobin G), Serum: 1328 mg/dL (ref 700–1600)
IgM, Serum: 40 mg/dL (ref 15–143)
Total Protein ELP: 6.2 g/dL (ref 6.0–8.5)

## 2016-06-30 MED ORDER — LENALIDOMIDE 15 MG PO CAPS: ORAL_CAPSULE | ORAL | 4 refills | Status: DC

## 2016-07-26 ENCOUNTER — Inpatient Hospital Stay: Payer: Medicare Other | Attending: Internal Medicine

## 2016-07-26 DIAGNOSIS — C9000 Multiple myeloma not having achieved remission: Secondary | ICD-10-CM | POA: Diagnosis present

## 2016-07-26 DIAGNOSIS — C9001 Multiple myeloma in remission: Secondary | ICD-10-CM

## 2016-07-26 LAB — BASIC METABOLIC PANEL
ANION GAP: 5 (ref 5–15)
BUN: 17 mg/dL (ref 6–20)
CO2: 25 mmol/L (ref 22–32)
Calcium: 8.6 mg/dL — ABNORMAL LOW (ref 8.9–10.3)
Chloride: 109 mmol/L (ref 101–111)
Creatinine, Ser: 1.36 mg/dL — ABNORMAL HIGH (ref 0.61–1.24)
GFR calc non Af Amer: 50 mL/min — ABNORMAL LOW (ref >60)
GFR, EST AFRICAN AMERICAN: 58 mL/min — AB (ref >60)
Glucose, Bld: 107 mg/dL — ABNORMAL HIGH (ref 65–99)
POTASSIUM: 4.1 mmol/L (ref 3.5–5.1)
SODIUM: 139 mmol/L (ref 135–145)

## 2016-07-26 LAB — CBC WITH DIFFERENTIAL/PLATELET
BASOS PCT: 5 %
Basophils Absolute: 0.1 10*3/uL (ref 0–0.1)
EOS ABS: 0.2 10*3/uL (ref 0–0.7)
Eosinophils Relative: 6 %
HCT: 34.4 % — ABNORMAL LOW (ref 40.0–52.0)
HEMOGLOBIN: 11.7 g/dL — AB (ref 13.0–18.0)
Lymphocytes Relative: 26 %
Lymphs Abs: 0.7 10*3/uL — ABNORMAL LOW (ref 1.0–3.6)
MCH: 31.7 pg (ref 26.0–34.0)
MCHC: 34 g/dL (ref 32.0–36.0)
MCV: 93.1 fL (ref 80.0–100.0)
Monocytes Absolute: 0.3 10*3/uL (ref 0.2–1.0)
Monocytes Relative: 12 %
NEUTROS PCT: 51 %
Neutro Abs: 1.5 10*3/uL (ref 1.4–6.5)
Platelets: 165 10*3/uL (ref 150–440)
RBC: 3.7 MIL/uL — AB (ref 4.40–5.90)
RDW: 16.6 % — ABNORMAL HIGH (ref 11.5–14.5)
WBC: 2.9 10*3/uL — ABNORMAL LOW (ref 3.8–10.6)

## 2016-08-23 ENCOUNTER — Inpatient Hospital Stay: Payer: Medicare Other | Attending: Internal Medicine

## 2016-08-23 DIAGNOSIS — C9001 Multiple myeloma in remission: Secondary | ICD-10-CM | POA: Insufficient documentation

## 2016-08-23 LAB — CBC WITH DIFFERENTIAL/PLATELET
Basophils Absolute: 0 10*3/uL (ref 0–0.1)
Basophils Relative: 1 %
Eosinophils Absolute: 0.2 10*3/uL (ref 0–0.7)
Eosinophils Relative: 8 %
HEMATOCRIT: 35.6 % — AB (ref 40.0–52.0)
HEMOGLOBIN: 11.9 g/dL — AB (ref 13.0–18.0)
LYMPHS ABS: 0.8 10*3/uL — AB (ref 1.0–3.6)
Lymphocytes Relative: 27 %
MCH: 31.3 pg (ref 26.0–34.0)
MCHC: 33.6 g/dL (ref 32.0–36.0)
MCV: 93.2 fL (ref 80.0–100.0)
MONOS PCT: 20 %
Monocytes Absolute: 0.6 10*3/uL (ref 0.2–1.0)
NEUTROS ABS: 1.3 10*3/uL — AB (ref 1.4–6.5)
NEUTROS PCT: 44 %
Platelets: 154 10*3/uL (ref 150–440)
RBC: 3.82 MIL/uL — ABNORMAL LOW (ref 4.40–5.90)
RDW: 16.4 % — ABNORMAL HIGH (ref 11.5–14.5)
WBC: 2.9 10*3/uL — ABNORMAL LOW (ref 3.8–10.6)

## 2016-08-23 LAB — BASIC METABOLIC PANEL
Anion gap: 3 — ABNORMAL LOW (ref 5–15)
BUN: 17 mg/dL (ref 6–20)
CHLORIDE: 110 mmol/L (ref 101–111)
CO2: 26 mmol/L (ref 22–32)
Calcium: 8.7 mg/dL — ABNORMAL LOW (ref 8.9–10.3)
Creatinine, Ser: 1.24 mg/dL (ref 0.61–1.24)
GFR calc Af Amer: >60 mL/min (ref >60)
GFR calc non Af Amer: 56 mL/min — ABNORMAL LOW (ref >60)
Glucose, Bld: 91 mg/dL (ref 65–99)
Potassium: 4.1 mmol/L (ref 3.5–5.1)
Sodium: 139 mmol/L (ref 135–145)

## 2016-08-28 ENCOUNTER — Other Ambulatory Visit: Payer: Self-pay

## 2016-08-28 DIAGNOSIS — C9000 Multiple myeloma not having achieved remission: Secondary | ICD-10-CM

## 2016-08-28 DIAGNOSIS — C9001 Multiple myeloma in remission: Secondary | ICD-10-CM

## 2016-08-28 MED ORDER — LENALIDOMIDE 15 MG PO CAPS
ORAL_CAPSULE | ORAL | 4 refills | Status: DC
Start: 2016-08-28 — End: 2016-09-25

## 2016-08-28 MED ORDER — LENALIDOMIDE 15 MG PO CAPS: ORAL_CAPSULE | ORAL | 4 refills | Status: DC

## 2016-09-20 ENCOUNTER — Inpatient Hospital Stay: Payer: Medicare Other | Attending: Internal Medicine

## 2016-09-20 DIAGNOSIS — C9001 Multiple myeloma in remission: Secondary | ICD-10-CM

## 2016-09-20 DIAGNOSIS — N189 Chronic kidney disease, unspecified: Secondary | ICD-10-CM | POA: Insufficient documentation

## 2016-09-20 DIAGNOSIS — Z7982 Long term (current) use of aspirin: Secondary | ICD-10-CM | POA: Insufficient documentation

## 2016-09-20 DIAGNOSIS — Z85850 Personal history of malignant neoplasm of thyroid: Secondary | ICD-10-CM | POA: Diagnosis not present

## 2016-09-20 DIAGNOSIS — Z79899 Other long term (current) drug therapy: Secondary | ICD-10-CM | POA: Diagnosis not present

## 2016-09-20 DIAGNOSIS — K219 Gastro-esophageal reflux disease without esophagitis: Secondary | ICD-10-CM | POA: Diagnosis not present

## 2016-09-20 DIAGNOSIS — E1122 Type 2 diabetes mellitus with diabetic chronic kidney disease: Secondary | ICD-10-CM | POA: Insufficient documentation

## 2016-09-20 DIAGNOSIS — E785 Hyperlipidemia, unspecified: Secondary | ICD-10-CM | POA: Diagnosis not present

## 2016-09-20 DIAGNOSIS — I129 Hypertensive chronic kidney disease with stage 1 through stage 4 chronic kidney disease, or unspecified chronic kidney disease: Secondary | ICD-10-CM | POA: Insufficient documentation

## 2016-09-20 LAB — COMPREHENSIVE METABOLIC PANEL
ALBUMIN: 3.3 g/dL — AB (ref 3.5–5.0)
ALK PHOS: 56 U/L (ref 38–126)
ALT: 16 U/L — AB (ref 17–63)
AST: 19 U/L (ref 15–41)
Anion gap: 6 (ref 5–15)
BILIRUBIN TOTAL: 0.7 mg/dL (ref 0.3–1.2)
BUN: 19 mg/dL (ref 6–20)
CALCIUM: 8.6 mg/dL — AB (ref 8.9–10.3)
CO2: 24 mmol/L (ref 22–32)
CREATININE: 1.63 mg/dL — AB (ref 0.61–1.24)
Chloride: 108 mmol/L (ref 101–111)
GFR calc Af Amer: 47 mL/min — ABNORMAL LOW (ref >60)
GFR, EST NON AFRICAN AMERICAN: 40 mL/min — AB (ref >60)
GLUCOSE: 128 mg/dL — AB (ref 65–99)
Potassium: 4.2 mmol/L (ref 3.5–5.1)
Sodium: 138 mmol/L (ref 135–145)
TOTAL PROTEIN: 7.1 g/dL (ref 6.5–8.1)

## 2016-09-20 LAB — CBC WITH DIFFERENTIAL/PLATELET
BASOS ABS: 0 10*3/uL (ref 0–0.1)
BASOS PCT: 1 %
Eosinophils Absolute: 0.3 10*3/uL (ref 0–0.7)
Eosinophils Relative: 7 %
HEMATOCRIT: 37.4 % — AB (ref 40.0–52.0)
HEMOGLOBIN: 12.8 g/dL — AB (ref 13.0–18.0)
LYMPHS PCT: 26 %
Lymphs Abs: 0.9 10*3/uL — ABNORMAL LOW (ref 1.0–3.6)
MCH: 31.9 pg (ref 26.0–34.0)
MCHC: 34.2 g/dL (ref 32.0–36.0)
MCV: 93.4 fL (ref 80.0–100.0)
MONO ABS: 0.7 10*3/uL (ref 0.2–1.0)
Monocytes Relative: 19 %
NEUTROS ABS: 1.7 10*3/uL (ref 1.4–6.5)
NEUTROS PCT: 47 %
Platelets: 189 10*3/uL (ref 150–440)
RBC: 4.01 MIL/uL — ABNORMAL LOW (ref 4.40–5.90)
RDW: 16.3 % — ABNORMAL HIGH (ref 11.5–14.5)
WBC: 3.6 10*3/uL — ABNORMAL LOW (ref 3.8–10.6)

## 2016-09-21 LAB — MULTIPLE MYELOMA PANEL, SERUM
Albumin SerPl Elph-Mcnc: 3.2 g/dL (ref 2.9–4.4)
Albumin/Glob SerPl: 1 (ref 0.7–1.7)
Alpha 1: 0.2 g/dL (ref 0.0–0.4)
Alpha2 Glob SerPl Elph-Mcnc: 0.6 g/dL (ref 0.4–1.0)
B-Globulin SerPl Elph-Mcnc: 1 g/dL (ref 0.7–1.3)
Gamma Glob SerPl Elph-Mcnc: 1.6 g/dL (ref 0.4–1.8)
Globulin, Total: 3.5 g/dL (ref 2.2–3.9)
IgA: 471 mg/dL — ABNORMAL HIGH (ref 61–437)
IgG (Immunoglobin G), Serum: 1429 mg/dL (ref 700–1600)
IgM (Immunoglobulin M), Srm: 50 mg/dL (ref 15–143)
Total Protein ELP: 6.7 g/dL (ref 6.0–8.5)

## 2016-09-21 LAB — KAPPA/LAMBDA LIGHT CHAINS
KAPPA FREE LGHT CHN: 77.8 mg/L — AB (ref 3.3–19.4)
KAPPA, LAMDA LIGHT CHAIN RATIO: 1.67 — AB (ref 0.26–1.65)
LAMDA FREE LIGHT CHAINS: 46.5 mg/L — AB (ref 5.7–26.3)

## 2016-09-25 ENCOUNTER — Other Ambulatory Visit: Payer: Self-pay | Admitting: *Deleted

## 2016-09-25 DIAGNOSIS — C9000 Multiple myeloma not having achieved remission: Secondary | ICD-10-CM

## 2016-09-25 DIAGNOSIS — C9001 Multiple myeloma in remission: Secondary | ICD-10-CM

## 2016-09-25 MED ORDER — LENALIDOMIDE 15 MG PO CAPS
ORAL_CAPSULE | ORAL | 0 refills | Status: DC
Start: 2016-09-25 — End: 2016-10-23

## 2016-09-27 ENCOUNTER — Other Ambulatory Visit: Payer: Medicare Other

## 2016-09-27 ENCOUNTER — Inpatient Hospital Stay: Payer: Medicare Other

## 2016-09-27 ENCOUNTER — Inpatient Hospital Stay (HOSPITAL_BASED_OUTPATIENT_CLINIC_OR_DEPARTMENT_OTHER): Payer: Medicare Other | Admitting: Internal Medicine

## 2016-09-27 VITALS — BP 137/71 | HR 55 | Temp 97.3°F | Resp 16 | Wt 235.6 lb

## 2016-09-27 DIAGNOSIS — Z7982 Long term (current) use of aspirin: Secondary | ICD-10-CM

## 2016-09-27 DIAGNOSIS — E1122 Type 2 diabetes mellitus with diabetic chronic kidney disease: Secondary | ICD-10-CM

## 2016-09-27 DIAGNOSIS — Z79899 Other long term (current) drug therapy: Secondary | ICD-10-CM | POA: Diagnosis not present

## 2016-09-27 DIAGNOSIS — N189 Chronic kidney disease, unspecified: Secondary | ICD-10-CM | POA: Diagnosis not present

## 2016-09-27 DIAGNOSIS — E785 Hyperlipidemia, unspecified: Secondary | ICD-10-CM

## 2016-09-27 DIAGNOSIS — I129 Hypertensive chronic kidney disease with stage 1 through stage 4 chronic kidney disease, or unspecified chronic kidney disease: Secondary | ICD-10-CM

## 2016-09-27 DIAGNOSIS — C9001 Multiple myeloma in remission: Secondary | ICD-10-CM

## 2016-09-27 DIAGNOSIS — Z85850 Personal history of malignant neoplasm of thyroid: Secondary | ICD-10-CM | POA: Diagnosis not present

## 2016-09-27 DIAGNOSIS — K219 Gastro-esophageal reflux disease without esophagitis: Secondary | ICD-10-CM | POA: Diagnosis not present

## 2016-09-27 MED ORDER — SODIUM CHLORIDE 0.9 % IV SOLN
INTRAVENOUS | Status: DC
  Administered 2016-09-27: 15:00:00 via INTRAVENOUS
  Filled 2016-09-27: qty 1000

## 2016-09-27 MED ORDER — ZOLEDRONIC ACID 4 MG/5ML IV CONC
3.0000 mg | Freq: Once | INTRAVENOUS | Status: AC
  Administered 2016-09-27: 3 mg via INTRAVENOUS
  Filled 2016-09-27: qty 3.75

## 2016-09-27 NOTE — Assessment & Plan Note (Addendum)
Multiple myeloma currently on maintenance Revlimid 15 mg 3 w-On & 1 w Off; Patient tolerating maintenance Revlimid very well; no side effects noted.   # no concerns for progression. Patient's most recent serum M protein AUG 2018 negative; serum free kappa/lambda light chain ratio- slightly abnormal.  # Labs today reviewed;  acceptable for treatment [Zometa] today. Continue Revlimid 15 mg 3 weeks on and one-week off.  # Creatinine is 1.6/ ca 8.8- on  Zometa q 3 months; On calcium and vitamin D. He will be due today. Discussed re: ONJ concerns; none at this time. Drink fluids.   #Check monthly CBC CMP [renal dysfunction];  We'll follow up with me in 3 months/CBC CMP-few days prior/ Zometa.

## 2016-09-27 NOTE — Progress Notes (Signed)
Patient is here today for a follow up. Patient states no new concerns today.  

## 2016-09-27 NOTE — Progress Notes (Signed)
[place] OFFICE PROGRESS NOTE  Patient Care Team: Madelyn Brunner, MD as PCP - General (Internal Medicine) Druscilla Brownie, MD as Consulting Physician (Dermatology)   SUMMARY OF ONCOLOGIC HISTORY: Oncology History    # April 2014- MULTIPLE MYELOMA [IgG- 2.2gm/dl; 40-50% plasma cell; Cyto-N; FISH- gain of chr.9, 15 & ccnd1/11q13] s/p RVD x6 [sep 2014; BMBx- ~5% plasma cells]; Maint Rev-DEX-Zometa; March 2015-Stopped Dex Cont- Rev-Zometa; July 2016- Rev 61m; NOV 4th  2016-HOLD; [facility] declined BMT.   # Re-START FEB 2017 Rev 136m3 W- on & 1 w OFF.   # Zometa q 75M  # CKD [creat ~1.4; sec MM]; DM     Multiple myeloma in remission ([facility]  10/01/2015 Initial Diagnosis    Multiple myeloma in remission ([facility]      INTERVAL HISTORY:  A pleasant  [facility] patient with above history of multiple myeloma currently on maintenance Revlimid is here for follow-up.   Patient denies any bone pain. Denies any tingling or numbness. Denies any fevers or chills. Denies any nausea vomiting diarrhea. Appetite is good.   REVIEW OF SYSTEMS:  A complete 10 point review of system is done which is negative except mentioned above/history of present illness.   PAST MEDICAL HISTORY :  Past Medical History:  Diagnosis Date  . Diabetes mellitus without complication ([facility]  . GERD (gastroesophageal reflux disease)   . Hyperlipidemia   . Hypertension   . Multiple myeloma ([facility]  . Obesity   . Thyroid cancer ([facility]  . Thyroid disease     PAST SURGICAL HISTORY :   Past Surgical History:  Procedure Laterality Date  . BONE MARROW BIOPSY  2014  . CATARACT EXTRACTION BILATERAL W/ ANTERIOR VITRECTOMY      FAMILY HISTORY :   Family History  Problem Relation Age of Onset  . Cancer Father 637. Diabetes Mother     SOCIAL HISTORY:   Social History  Substance Use Topics  . Smoking status: Never Smoker  . Smokeless tobacco: Never Used  . Alcohol use  No    ALLERGIES:  has No Known Allergies.  MEDICATIONS:  Current Outpatient Prescriptions  Medication Sig Dispense Refill  . acyclovir (ZOVIRAX) 400 MG tablet 400 mg 2 (two) times daily.    . Marland KitchenmLODipine (NORVASC) 10 MG tablet Take 10 mg by mouth daily.    . Marland Kitchenspirin EC 81 MG tablet Take 81 mg by mouth daily.    . Marland Kitchentorvastatin (LIPITOR) 20 MG tablet Take 20 mg by mouth daily.    . cloNIDine (CATAPRES) 0.1 MG tablet Take 0.1 mg by mouth 2 (two) times daily.    . Cyanocobalamin (RA VITAMIN B-12 TR) 1000 MCG TBCR Take by mouth.    . lenalidomide (REVLIMID) 15 MG capsule 1 capsule Daily for 3 weeks and then 1 week off 21 capsule 0  . levothyroxine (SYNTHROID) 200 MCG tablet Take 1 tablet by mouth daily.    . Marland Kitchenosartan (COZAAR) 100 MG tablet Take 100 mg by mouth daily.    . Glory RosebushELICA LANCETS 3326JISC Inject 1 Lancet as directed once daily.    . pioglitazone (ACTOS) 30 MG tablet Take 30 mg by mouth daily.    . potassium chloride SA (K-DUR,KLOR-CON) 20 MEQ tablet Take 20 mEq by mouth 2 (two) times daily.   0  . quinapril (ACCUPRIL) 40 MG tablet Take 40 mg by mouth daily.    . sennosides-docusate sodium (  SENOKOT-S) 8.6-50 MG tablet Take 2 tablets by mouth daily.     No current facility-administered medications for this visit.    Facility-Administered Medications Ordered in Other Visits  Medication Dose Route Frequency Provider Last Rate Last Dose  . 0.9 %  sodium chloride infusion   Intravenous Continuous Cammie Sickle, MD   Stopped at 12/11/14 1528    PHYSICAL EXAMINATION: ECOG PERFORMANCE STATUS: 0 - Asymptomatic  BP 137/71 (BP Location: Left Arm, Patient Position: Sitting)   Pulse (!) 55   Temp (!) 97.3 F (36.3 C) (Tympanic)   Resp 16   Wt 235 lb 9.6 oz (106.9 kg)   BMI 33.81 kg/m   Filed Weights   09/27/16 1409  Weight: 235 lb 9.6 oz (106.9 kg)    GENERAL: Well-nourished well-developed; Alert, no distress and comfortable. He is alone.  No pallor or  icterus OROPHARYNX: no thrush or ulceration; good dentition  NECK: supple, no masses felt LYMPH:  no palpable lymphadenopathy in the cervical, axillary or inguinal regions LUNGS: clear to auscultation and  No wheeze or crackles HEART/CVS: regular rate & rhythm and no murmurs; No lower extremity edema ABDOMEN:abdomen soft, non-tender and normal bowel sounds Musculoskeletal:no cyanosis of digits and no clubbing  PSYCH: alert & oriented x 3 with fluent speech NEURO: no focal motor/sensory deficits SKIN:  no rashes or significant lesions  LABORATORY DATA:  I have reviewed the data as listed    Component Value Date/Time   NA 138 09/20/2016 1408   NA 139 05/01/2014 1322   K 4.2 09/20/2016 1408   K 4.4 05/01/2014 1322   CL 108 09/20/2016 1408   CL 107 05/01/2014 1322   CO2 24 09/20/2016 1408   CO2 27 05/01/2014 1322   GLUCOSE 128 (H) 09/20/2016 1408   GLUCOSE 102 (H) 05/01/2014 1322   BUN 19 09/20/2016 1408   BUN 16 05/01/2014 1322   CREATININE 1.63 (H) 09/20/2016 1408   CREATININE 1.48 (H) 05/29/2014 0949   CALCIUM 8.6 (L) 09/20/2016 1408   CALCIUM 9.1 05/01/2014 1322   PROT 7.1 09/20/2016 1408   PROT 7.5 05/01/2014 1322   ALBUMIN 3.3 (L) 09/20/2016 1408   ALBUMIN 3.6 05/01/2014 1322   AST 19 09/20/2016 1408   AST 17 05/01/2014 1322   ALT 16 (L) 09/20/2016 1408   ALT 13 (L) 05/01/2014 1322   ALKPHOS 56 09/20/2016 1408   ALKPHOS 47 05/01/2014 1322   BILITOT 0.7 09/20/2016 1408   BILITOT 0.6 05/01/2014 1322   GFRNONAA 40 (L) 09/20/2016 1408   GFRNONAA 47 (L) 05/29/2014 0949   GFRAA 47 (L) 09/20/2016 1408   GFRAA 55 (L) 05/29/2014 0949    No results found for: SPEP, UPEP  Lab Results  Component Value Date   WBC 3.6 (L) 09/20/2016   NEUTROABS 1.7 09/20/2016   HGB 12.8 (L) 09/20/2016   HCT 37.4 (L) 09/20/2016   MCV 93.4 09/20/2016   PLT 189 09/20/2016      Chemistry      Component Value Date/Time   NA 138 09/20/2016 1408   NA 139 05/01/2014 1322   K 4.2  09/20/2016 1408   K 4.4 05/01/2014 1322   CL 108 09/20/2016 1408   CL 107 05/01/2014 1322   CO2 24 09/20/2016 1408   CO2 27 05/01/2014 1322   BUN 19 09/20/2016 1408   BUN 16 05/01/2014 1322   CREATININE 1.63 (H) 09/20/2016 1408   CREATININE 1.48 (H) 05/29/2014 0949      Component Value  Date/Time   CALCIUM 8.6 (L) 09/20/2016 1408   CALCIUM 9.1 05/01/2014 1322   ALKPHOS 56 09/20/2016 1408   ALKPHOS 47 05/01/2014 1322   AST 19 09/20/2016 1408   AST 17 05/01/2014 1322   ALT 16 (L) 09/20/2016 1408   ALT 13 (L) 05/01/2014 1322   BILITOT 0.7 09/20/2016 1408   BILITOT 0.6 05/01/2014 1322        ASSESSMENT & PLAN:  Multiple myeloma in remission ([place]) Multiple myeloma currently on maintenance Revlimid 15 mg 3 w-On & 1 w Off; Patient tolerating maintenance Revlimid very well; no side effects noted.   # no concerns for progression. Patient's most recent serum M protein AUG 2018 negative; serum free kappa/lambda light chain ratio- slightly abnormal.  # Labs today reviewed;  acceptable for treatment [Zometa] today. Continue Revlimid 15 mg 3 weeks on and one-week off.  # Creatinine is 1.6/ ca 8.8- on  Zometa q 3 months; On calcium and vitamin D. He will be due today. Discussed re: ONJ concerns; none at this time. Drink fluids.   #Check monthly CBC CMP [renal dysfunction];  We'll follow up with me in 3 months/CBC CMP-few days prior/ Zometa.       Cammie Sickle, MD 09/27/2016 11:47 PM

## 2016-10-23 ENCOUNTER — Other Ambulatory Visit: Payer: Self-pay | Admitting: *Deleted

## 2016-10-23 DIAGNOSIS — C9001 Multiple myeloma in remission: Secondary | ICD-10-CM

## 2016-10-23 DIAGNOSIS — C9000 Multiple myeloma not having achieved remission: Secondary | ICD-10-CM

## 2016-10-23 MED ORDER — LENALIDOMIDE 15 MG PO CAPS: ORAL_CAPSULE | ORAL | 0 refills | Status: DC

## 2016-11-01 ENCOUNTER — Inpatient Hospital Stay: Payer: Medicare Other | Attending: Internal Medicine

## 2016-11-01 DIAGNOSIS — C9001 Multiple myeloma in remission: Secondary | ICD-10-CM | POA: Diagnosis not present

## 2016-11-01 LAB — COMPREHENSIVE METABOLIC PANEL
ALBUMIN: 3.5 g/dL (ref 3.5–5.0)
ALT: 14 U/L — ABNORMAL LOW (ref 17–63)
AST: 20 U/L (ref 15–41)
Alkaline Phosphatase: 53 U/L (ref 38–126)
Anion gap: 5 (ref 5–15)
BILIRUBIN TOTAL: 0.8 mg/dL (ref 0.3–1.2)
BUN: 16 mg/dL (ref 6–20)
CALCIUM: 9.1 mg/dL (ref 8.9–10.3)
CO2: 27 mmol/L (ref 22–32)
Chloride: 105 mmol/L (ref 101–111)
Creatinine, Ser: 1.31 mg/dL — ABNORMAL HIGH (ref 0.61–1.24)
GFR calc Af Amer: >60 mL/min (ref >60)
GFR calc non Af Amer: 53 mL/min — ABNORMAL LOW (ref >60)
GLUCOSE: 107 mg/dL — AB (ref 65–99)
POTASSIUM: 4 mmol/L (ref 3.5–5.1)
SODIUM: 137 mmol/L (ref 135–145)
Total Protein: 7.4 g/dL (ref 6.5–8.1)

## 2016-11-01 LAB — CBC WITH DIFFERENTIAL/PLATELET
BASOS ABS: 0.1 10*3/uL (ref 0–0.1)
BASOS PCT: 2 %
EOS ABS: 0.2 10*3/uL (ref 0–0.7)
Eosinophils Relative: 8 %
HEMATOCRIT: 36.6 % — AB (ref 40.0–52.0)
HEMOGLOBIN: 12.5 g/dL — AB (ref 13.0–18.0)
Lymphocytes Relative: 25 %
Lymphs Abs: 0.7 10*3/uL — ABNORMAL LOW (ref 1.0–3.6)
MCH: 31.8 pg (ref 26.0–34.0)
MCHC: 34.1 g/dL (ref 32.0–36.0)
MCV: 93.2 fL (ref 80.0–100.0)
MONO ABS: 0.5 10*3/uL (ref 0.2–1.0)
Monocytes Relative: 17 %
NEUTROS ABS: 1.3 10*3/uL — AB (ref 1.4–6.5)
NEUTROS PCT: 48 %
Platelets: 229 10*3/uL (ref 150–440)
RBC: 3.93 MIL/uL — ABNORMAL LOW (ref 4.40–5.90)
RDW: 16.6 % — AB (ref 11.5–14.5)
WBC: 2.8 10*3/uL — ABNORMAL LOW (ref 3.8–10.6)

## 2016-11-06 ENCOUNTER — Telehealth: Payer: Self-pay | Admitting: Internal Medicine

## 2016-11-06 NOTE — Telephone Encounter (Signed)
Oral Oncology Patient Advocate Encounter  PAF [employer] diagnosis verification form was completed and faxed to [employer] at [phone]  This is the last step from the office needed to secure continued patient access to funding.   [place] Patient Advocate [phone] 11/06/2016 2:40 PM

## 2016-11-23 ENCOUNTER — Telehealth: Payer: Self-pay | Admitting: *Deleted

## 2016-11-23 ENCOUNTER — Other Ambulatory Visit: Payer: Self-pay | Admitting: *Deleted

## 2016-11-23 DIAGNOSIS — C9001 Multiple myeloma in remission: Secondary | ICD-10-CM

## 2016-11-23 MED ORDER — LENALIDOMIDE 15 MG PO CAPS: ORAL_CAPSULE | ORAL | 0 refills | Status: DC

## 2016-11-23 NOTE — Telephone Encounter (Signed)
Patient called-requesting to speak to [facility], RN.  Call returned. Patient is due for RF on revlimid. Reassured patient that our team is actively working to submit the revlimid rx to [facility] and had just performed the rems survey. [employer] should contact him with the shipping of his RF. Patient states he is tolerating the Revlimid without any concerns. He was instructed to perform his patient survey before the medication is shipped. Pt reminded not to share his revlimid with anyone, donate blood/sperm or have unprotected sex with a male that could potential get pregnant while taking revlimid. He gave verbal understanding and stated that he would also perform his self survey with rems program.

## 2016-11-29 ENCOUNTER — Inpatient Hospital Stay: Payer: Medicare Other | Attending: Internal Medicine

## 2016-11-29 DIAGNOSIS — C9001 Multiple myeloma in remission: Secondary | ICD-10-CM | POA: Diagnosis present

## 2016-11-29 LAB — COMPREHENSIVE METABOLIC PANEL
ALBUMIN: 3.5 g/dL (ref 3.5–5.0)
ALT: 15 U/L — AB (ref 17–63)
AST: 20 U/L (ref 15–41)
Alkaline Phosphatase: 51 U/L (ref 38–126)
Anion gap: 5 (ref 5–15)
BILIRUBIN TOTAL: 0.7 mg/dL (ref 0.3–1.2)
BUN: 15 mg/dL (ref 6–20)
CO2: 26 mmol/L (ref 22–32)
Calcium: 9 mg/dL (ref 8.9–10.3)
Chloride: 106 mmol/L (ref 101–111)
Creatinine, Ser: 1.27 mg/dL — ABNORMAL HIGH (ref 0.61–1.24)
GFR calc Af Amer: >60 mL/min (ref >60)
GFR calc non Af Amer: 55 mL/min — ABNORMAL LOW (ref >60)
GLUCOSE: 111 mg/dL — AB (ref 65–99)
POTASSIUM: 4 mmol/L (ref 3.5–5.1)
Sodium: 137 mmol/L (ref 135–145)
Total Protein: 7.6 g/dL (ref 6.5–8.1)

## 2016-11-29 LAB — CBC WITH DIFFERENTIAL/PLATELET
BASOS ABS: 0 10*3/uL (ref 0–0.1)
BASOS PCT: 2 %
Eosinophils Absolute: 0.2 10*3/uL (ref 0–0.7)
Eosinophils Relative: 6 %
HEMATOCRIT: 36.7 % — AB (ref 40.0–52.0)
HEMOGLOBIN: 12.3 g/dL — AB (ref 13.0–18.0)
LYMPHS PCT: 26 %
Lymphs Abs: 0.8 10*3/uL — ABNORMAL LOW (ref 1.0–3.6)
MCH: 31.8 pg (ref 26.0–34.0)
MCHC: 33.6 g/dL (ref 32.0–36.0)
MCV: 94.7 fL (ref 80.0–100.0)
Monocytes Absolute: 0.7 10*3/uL (ref 0.2–1.0)
Monocytes Relative: 22 %
NEUTROS ABS: 1.4 10*3/uL (ref 1.4–6.5)
NEUTROS PCT: 44 %
Platelets: 222 10*3/uL (ref 150–440)
RBC: 3.88 MIL/uL — AB (ref 4.40–5.90)
RDW: 17 % — AB (ref 11.5–14.5)
WBC: 3.1 10*3/uL — AB (ref 3.8–10.6)

## 2016-12-05 NOTE — Telephone Encounter (Signed)
Oral Oncology Patient Advocate Encounter  Faxed paper work patient brought for PAF. [place] Patient Advocate [phone] 12/05/2016 9:00 AM

## 2016-12-19 ENCOUNTER — Other Ambulatory Visit: Payer: Self-pay | Admitting: *Deleted

## 2016-12-19 DIAGNOSIS — C9001 Multiple myeloma in remission: Secondary | ICD-10-CM

## 2016-12-19 MED ORDER — LENALIDOMIDE 15 MG PO CAPS: ORAL_CAPSULE | ORAL | 0 refills | Status: DC

## 2017-01-03 ENCOUNTER — Inpatient Hospital Stay: Payer: Medicare Other

## 2017-01-03 ENCOUNTER — Encounter: Payer: Self-pay | Admitting: Internal Medicine

## 2017-01-03 ENCOUNTER — Inpatient Hospital Stay (HOSPITAL_BASED_OUTPATIENT_CLINIC_OR_DEPARTMENT_OTHER): Payer: Medicare Other | Admitting: Internal Medicine

## 2017-01-03 ENCOUNTER — Inpatient Hospital Stay: Payer: Medicare Other | Attending: Internal Medicine

## 2017-01-03 VITALS — BP 135/74 | HR 55 | Temp 98.1°F | Resp 16 | Wt 243.6 lb

## 2017-01-03 DIAGNOSIS — E1122 Type 2 diabetes mellitus with diabetic chronic kidney disease: Secondary | ICD-10-CM

## 2017-01-03 DIAGNOSIS — K219 Gastro-esophageal reflux disease without esophagitis: Secondary | ICD-10-CM | POA: Insufficient documentation

## 2017-01-03 DIAGNOSIS — Z7982 Long term (current) use of aspirin: Secondary | ICD-10-CM

## 2017-01-03 DIAGNOSIS — E079 Disorder of thyroid, unspecified: Secondary | ICD-10-CM

## 2017-01-03 DIAGNOSIS — C9001 Multiple myeloma in remission: Secondary | ICD-10-CM

## 2017-01-03 DIAGNOSIS — I129 Hypertensive chronic kidney disease with stage 1 through stage 4 chronic kidney disease, or unspecified chronic kidney disease: Secondary | ICD-10-CM | POA: Insufficient documentation

## 2017-01-03 DIAGNOSIS — E785 Hyperlipidemia, unspecified: Secondary | ICD-10-CM

## 2017-01-03 DIAGNOSIS — Z79899 Other long term (current) drug therapy: Secondary | ICD-10-CM | POA: Insufficient documentation

## 2017-01-03 DIAGNOSIS — N189 Chronic kidney disease, unspecified: Secondary | ICD-10-CM | POA: Insufficient documentation

## 2017-01-03 DIAGNOSIS — Z809 Family history of malignant neoplasm, unspecified: Secondary | ICD-10-CM | POA: Insufficient documentation

## 2017-01-03 LAB — CBC WITH DIFFERENTIAL/PLATELET
BASOS ABS: 0 10*3/uL (ref 0–0.1)
Basophils Relative: 1 %
EOS PCT: 5 %
Eosinophils Absolute: 0.2 10*3/uL (ref 0–0.7)
HEMATOCRIT: 37.8 % — AB (ref 40.0–52.0)
Hemoglobin: 12.3 g/dL — ABNORMAL LOW (ref 13.0–18.0)
LYMPHS ABS: 0.8 10*3/uL — AB (ref 1.0–3.6)
LYMPHS PCT: 26 %
MCH: 31.1 pg (ref 26.0–34.0)
MCHC: 32.5 g/dL (ref 32.0–36.0)
MCV: 95.5 fL (ref 80.0–100.0)
MONO ABS: 0.3 10*3/uL (ref 0.2–1.0)
MONOS PCT: 9 %
Neutro Abs: 1.8 10*3/uL (ref 1.4–6.5)
Neutrophils Relative %: 59 %
PLATELETS: 217 10*3/uL (ref 150–440)
RBC: 3.96 MIL/uL — ABNORMAL LOW (ref 4.40–5.90)
RDW: 16.9 % — AB (ref 11.5–14.5)
WBC: 3 10*3/uL — ABNORMAL LOW (ref 3.8–10.6)

## 2017-01-03 LAB — COMPREHENSIVE METABOLIC PANEL
ALT: 14 U/L — ABNORMAL LOW (ref 17–63)
ANION GAP: 6 (ref 5–15)
AST: 18 U/L (ref 15–41)
Albumin: 3.5 g/dL (ref 3.5–5.0)
Alkaline Phosphatase: 42 U/L (ref 38–126)
BILIRUBIN TOTAL: 0.6 mg/dL (ref 0.3–1.2)
BUN: 18 mg/dL (ref 6–20)
CO2: 25 mmol/L (ref 22–32)
CREATININE: 1.59 mg/dL — AB (ref 0.61–1.24)
Calcium: 8.7 mg/dL — ABNORMAL LOW (ref 8.9–10.3)
Chloride: 107 mmol/L (ref 101–111)
GFR, EST AFRICAN AMERICAN: 48 mL/min — AB (ref >60)
GFR, EST NON AFRICAN AMERICAN: 42 mL/min — AB (ref >60)
GLUCOSE: 102 mg/dL — AB (ref 65–99)
POTASSIUM: 4.2 mmol/L (ref 3.5–5.1)
Sodium: 138 mmol/L (ref 135–145)
TOTAL PROTEIN: 7.5 g/dL (ref 6.5–8.1)

## 2017-01-03 MED ORDER — ZOLEDRONIC ACID 4 MG/100ML IV SOLN
3.0000 mg | Freq: Once | INTRAVENOUS | Status: AC
  Administered 2017-01-03: 3 mg via INTRAVENOUS
  Filled 2017-01-03: qty 100

## 2017-01-03 MED ORDER — SODIUM CHLORIDE 0.9 % IV SOLN
INTRAVENOUS | Status: DC
  Administered 2017-01-03: 13:00:00 via INTRAVENOUS
  Filled 2017-01-03: qty 1000

## 2017-01-03 NOTE — Assessment & Plan Note (Addendum)
Multiple myeloma currently on maintenance Revlimid 15 mg 3 w-On & 1 w Off; Patient tolerating maintenance Revlimid very well; no side effects noted.   # no concerns for progression. Patient's most recent serum M protein AUG 2018 negative; serum free kappa/lambda light chain ratio- slightly abnormal.  Labs from today pending.  # Labs today reviewed;  acceptable for treatment [Zometa] today. Continue Revlimid 15 mg 3 weeks on and one-week off.  # Creatinine is 1.59/ ca 8.7- on  Zometa q 3 months; On calcium and vitamin D. He will be due today. Discussed re: ONJ concerns; none at this time. Drink fluids.   #Check monthly CBC bmp [renal dysfunction];  We'll follow up with me in 3 months/CBC CMP-few days prior/ Zometa.

## 2017-01-03 NOTE — Progress Notes (Signed)
[facility] OFFICE PROGRESS NOTE  Patient Care Team: Madelyn Brunner, MD as PCP - General (Internal Medicine) Druscilla Brownie, MD as Consulting Physician (Dermatology)   SUMMARY OF ONCOLOGIC HISTORY: Oncology History    # April 2014- MULTIPLE MYELOMA [IgG- 2.2gm/dl; 40-50% plasma cell; Cyto-N; FISH- gain of chr.9, 15 & ccnd1/11q13] s/p RVD x6 [sep 2014; BMBx- ~5% plasma cells]; Maint Rev-DEX-Zometa; March 2015-Stopped Dex Cont- Rev-Zometa; July 2016- Rev 19m; NOV 4th  2016-HOLD; [facility] declined BMT.   # Re-START FEB 2017 Rev 12m3 W- on & 1 w OFF.   # Zometa q 36M  # CKD [creat ~1.4; sec MM]; DM     Multiple myeloma in remission (HCS[facility] [place]  10/01/2015 Initial Diagnosis    Multiple myeloma in remission ([facility]       INTERVAL HISTORY:  A pleasant  89ear old male patient with above history of multiple myeloma currently on maintenance Revlimid is here for follow-up.   Patient denies any unusual cough or shortness of breath or chest pain.  Denies any skin rash or diarrhea. Patient denies any bone pain. Denies any tingling or numbness. Denies any fevers or chills. Denies any nausea vomiting diarrhea. Appetite is good.   REVIEW OF SYSTEMS:  A complete 10 point review of system is done which is negative except mentioned above/history of present illness.   PAST MEDICAL HISTORY :  Past Medical History:  Diagnosis Date  . Diabetes mellitus without complication (HCMountain [place]  . GERD (gastroesophageal reflux disease)   . Hyperlipidemia   . Hypertension   . Multiple myeloma ([facility]  . Obesity   . Thyroid cancer (HCChesape[place]  . Thyroid disease     PAST SURGICAL HISTORY :   Past Surgical History:  Procedure Laterality Date  . BONE MARROW BIOPSY  2014  . CATARACT EXTRACTION BILATERAL W/ ANTERIOR VITRECTOMY      FAMILY HISTORY :   Family History  Problem Relation Age of Onset  . Cancer Father 6348. Diabetes Mother     SOCIAL HISTORY:   Social  History   Tobacco Use  . Smoking status: Never Smoker  . Smokeless tobacco: Never Used  Substance Use Topics  . Alcohol use: No  . Drug use: No    ALLERGIES:  has No Known Allergies.  MEDICATIONS:  Current Outpatient Medications  Medication Sig Dispense Refill  . acyclovir (ZOVIRAX) 400 MG tablet 400 mg 2 (two) times daily.    . Marland KitchenmLODipine (NORVASC) 10 MG tablet Take 10 mg by mouth daily.    . Marland Kitchenspirin EC 81 MG tablet Take 81 mg by mouth daily.    . Marland Kitchentorvastatin (LIPITOR) 20 MG tablet Take 20 mg by mouth daily.    . cloNIDine (CATAPRES) 0.1 MG tablet Take 0.1 mg by mouth 2 (two) times daily.    . Cyanocobalamin (RA VITAMIN B-12 TR) 1000 MCG TBCR Take by mouth.    . lenalidomide (REVLIMID) 15 MG capsule 1 capsule Daily for 3 weeks and then 1 week off 21 capsule 0  . levothyroxine (SYNTHROID) 200 MCG tablet Take 1 tablet by mouth daily.    . Marland Kitchenosartan (COZAAR) 100 MG tablet Take 100 mg by mouth daily.    . Glory RosebushELICA LANCETS [id number] Inject 1 Lancet as directed once daily.    . pioglitazone (ACTOS) 30 MG tablet Take 30 mg by mouth daily.    . potassium chloride SA (K-DUR,KLOR-CON) 20 MEQ tablet Take 20  mEq by mouth 2 (two) times daily.   0  . quinapril (ACCUPRIL) 40 MG tablet Take 40 mg by mouth daily.    . sennosides-docusate sodium (SENOKOT-S) 8.6-50 MG tablet Take 2 tablets by mouth daily.     No current facility-administered medications for this visit.    Facility-Administered Medications Ordered in Other Visits  Medication Dose Route Frequency Provider Last Rate Last Dose  . 0.9 %  sodium chloride infusion   Intravenous Continuous Cammie Sickle, MD   Stopped at 12/11/14 1528    PHYSICAL EXAMINATION: ECOG PERFORMANCE STATUS: 0 - Asymptomatic  BP 135/74 (BP Location: Left Arm, Patient Position: Sitting)   Pulse (!) 55   Temp 98.1 F (36.7 C) (Tympanic)   Resp 16   Wt 243 lb 9.6 oz (110.5 kg)   BMI 34.95 kg/m   Filed Weights   01/03/17 1151  Weight: 243  lb 9.6 oz (110.5 kg)    GENERAL: Well-nourished well-developed; Alert, no distress and comfortable. He is alone.  No pallor or icterus OROPHARYNX: no thrush or ulceration; good dentition  NECK: supple, no masses felt LYMPH:  no palpable lymphadenopathy in the cervical, axillary or inguinal regions LUNGS: clear to auscultation and  No wheeze or crackles HEART/CVS: regular rate & rhythm and no murmurs; No lower extremity edema ABDOMEN:abdomen soft, non-tender and normal bowel sounds Musculoskeletal:no cyanosis of digits and no clubbing  PSYCH: alert & oriented x 3 with fluent speech NEURO: no focal motor/sensory deficits SKIN:  no rashes or significant lesions  LABORATORY DATA:  I have reviewed the data as listed    Component Value Date/Time   NA 138 01/03/2017 1136   NA 139 05/01/2014 1322   K 4.2 01/03/2017 1136   K 4.4 05/01/2014 1322   CL 107 01/03/2017 1136   CL 107 05/01/2014 1322   CO2 25 01/03/2017 1136   CO2 27 05/01/2014 1322   GLUCOSE 102 (H) 01/03/2017 1136   GLUCOSE 102 (H) 05/01/2014 1322   BUN 18 01/03/2017 1136   BUN 16 05/01/2014 1322   CREATININE 1.59 (H) 01/03/2017 1136   CREATININE 1.48 (H) 05/29/2014 0949   CALCIUM 8.7 (L) 01/03/2017 1136   CALCIUM 9.1 05/01/2014 1322   PROT 7.5 01/03/2017 1136   PROT 7.5 05/01/2014 1322   ALBUMIN 3.5 01/03/2017 1136   ALBUMIN 3.6 05/01/2014 1322   AST 18 01/03/2017 1136   AST 17 05/01/2014 1322   ALT 14 (L) 01/03/2017 1136   ALT 13 (L) 05/01/2014 1322   ALKPHOS 42 01/03/2017 1136   ALKPHOS 47 05/01/2014 1322   BILITOT 0.6 01/03/2017 1136   BILITOT 0.6 05/01/2014 1322   GFRNONAA 42 (L) 01/03/2017 1136   GFRNONAA 47 (L) 05/29/2014 0949   GFRAA 48 (L) 01/03/2017 1136   GFRAA 55 (L) 05/29/2014 0949    No results found for: SPEP, UPEP  Lab Results  Component Value Date   WBC 3.0 (L) 01/03/2017   NEUTROABS 1.8 01/03/2017   HGB 12.3 (L) 01/03/2017   HCT 37.8 (L) 01/03/2017   MCV 95.5 01/03/2017   PLT 217  01/03/2017      Chemistry      Component Value Date/Time   NA 138 01/03/2017 1136   NA 139 05/01/2014 1322   K 4.2 01/03/2017 1136   K 4.4 05/01/2014 1322   CL 107 01/03/2017 1136   CL 107 05/01/2014 1322   CO2 25 01/03/2017 1136   CO2 27 05/01/2014 1322   BUN 18 01/03/2017 1136  BUN 16 05/01/2014 1322   CREATININE 1.59 (H) 01/03/2017 1136   CREATININE 1.48 (H) 05/29/2014 0949      Component Value Date/Time   CALCIUM 8.7 (L) 01/03/2017 1136   CALCIUM 9.1 05/01/2014 1322   ALKPHOS 42 01/03/2017 1136   ALKPHOS 47 05/01/2014 1322   AST 18 01/03/2017 1136   AST 17 05/01/2014 1322   ALT 14 (L) 01/03/2017 1136   ALT 13 (L) 05/01/2014 1322   BILITOT 0.6 01/03/2017 1136   BILITOT 0.6 05/01/2014 1322        ASSESSMENT & PLAN:  Multiple myeloma in remission ([place]) Multiple myeloma currently on maintenance Revlimid 15 mg 3 w-On & 1 w Off; Patient tolerating maintenance Revlimid very well; no side effects noted.   # no concerns for progression. Patient's most recent serum M protein AUG 2018 negative; serum free kappa/lambda light chain ratio- slightly abnormal.  Labs from today pending.  # Labs today reviewed;  acceptable for treatment [Zometa] today. Continue Revlimid 15 mg 3 weeks on and one-week off.  # Creatinine is 1.59/ ca 8.7- on  Zometa q 3 months; On calcium and vitamin D. He will be due today. Discussed re: ONJ concerns; none at this time. Drink fluids.   #Check monthly CBC bmp [renal dysfunction];  We'll follow up with me in 3 months/CBC CMP-few days prior/ Zometa.       Cammie Sickle, MD 01/03/2017 7:52 PM

## 2017-01-04 LAB — MULTIPLE MYELOMA PANEL, SERUM
ALBUMIN SERPL ELPH-MCNC: 3.2 g/dL (ref 2.9–4.4)
ALPHA 1: 0.2 g/dL (ref 0.0–0.4)
Albumin/Glob SerPl: 0.9 (ref 0.7–1.7)
Alpha2 Glob SerPl Elph-Mcnc: 0.6 g/dL (ref 0.4–1.0)
B-Globulin SerPl Elph-Mcnc: 1.1 g/dL (ref 0.7–1.3)
GAMMA GLOB SERPL ELPH-MCNC: 1.7 g/dL (ref 0.4–1.8)
GLOBULIN, TOTAL: 3.7 g/dL (ref 2.2–3.9)
IGA: 558 mg/dL — AB (ref 61–437)
IgG (Immunoglobin G), Serum: 1665 mg/dL — ABNORMAL HIGH (ref 700–1600)
IgM (Immunoglobulin M), Srm: 45 mg/dL (ref 15–143)
Total Protein ELP: 6.9 g/dL (ref 6.0–8.5)

## 2017-01-05 LAB — KAPPA/LAMBDA LIGHT CHAINS
KAPPA FREE LGHT CHN: 83.7 mg/L — AB (ref 3.3–19.4)
Kappa, lambda light chain ratio: 2.01 — ABNORMAL HIGH (ref 0.26–1.65)
Lambda free light chains: 41.7 mg/L — ABNORMAL HIGH (ref 5.7–26.3)

## 2017-01-23 ENCOUNTER — Other Ambulatory Visit: Payer: Self-pay | Admitting: *Deleted

## 2017-01-23 DIAGNOSIS — C9001 Multiple myeloma in remission: Secondary | ICD-10-CM

## 2017-01-23 MED ORDER — LENALIDOMIDE 15 MG PO CAPS: ORAL_CAPSULE | ORAL | 0 refills | Status: DC

## 2017-01-31 ENCOUNTER — Inpatient Hospital Stay: Payer: Medicare Other | Attending: Internal Medicine

## 2017-01-31 DIAGNOSIS — C9001 Multiple myeloma in remission: Secondary | ICD-10-CM | POA: Insufficient documentation

## 2017-01-31 LAB — CBC WITH DIFFERENTIAL/PLATELET
BASOS ABS: 0 10*3/uL (ref 0–0.1)
BASOS PCT: 2 %
Eosinophils Absolute: 0.1 10*3/uL (ref 0–0.7)
Eosinophils Relative: 5 %
HEMATOCRIT: 38.2 % — AB (ref 40.0–52.0)
Hemoglobin: 12.4 g/dL — ABNORMAL LOW (ref 13.0–18.0)
LYMPHS PCT: 27 %
Lymphs Abs: 0.8 10*3/uL — ABNORMAL LOW (ref 1.0–3.6)
MCH: 31.4 pg (ref 26.0–34.0)
MCHC: 32.6 g/dL (ref 32.0–36.0)
MCV: 96.3 fL (ref 80.0–100.0)
MONOS PCT: 11 %
Monocytes Absolute: 0.3 10*3/uL (ref 0.2–1.0)
NEUTROS ABS: 1.6 10*3/uL (ref 1.4–6.5)
NEUTROS PCT: 55 %
Platelets: 240 10*3/uL (ref 150–440)
RBC: 3.97 MIL/uL — ABNORMAL LOW (ref 4.40–5.90)
RDW: 17.3 % — ABNORMAL HIGH (ref 11.5–14.5)
WBC: 2.8 10*3/uL — ABNORMAL LOW (ref 3.8–10.6)

## 2017-01-31 LAB — COMPREHENSIVE METABOLIC PANEL
ALK PHOS: 49 U/L (ref 38–126)
ALT: 12 U/L — ABNORMAL LOW (ref 17–63)
ANION GAP: 7 (ref 5–15)
AST: 17 U/L (ref 15–41)
Albumin: 3.4 g/dL — ABNORMAL LOW (ref 3.5–5.0)
BUN: 19 mg/dL (ref 6–20)
CALCIUM: 9 mg/dL (ref 8.9–10.3)
CO2: 26 mmol/L (ref 22–32)
Chloride: 105 mmol/L (ref 101–111)
Creatinine, Ser: 1.52 mg/dL — ABNORMAL HIGH (ref 0.61–1.24)
GFR calc non Af Amer: 44 mL/min — ABNORMAL LOW (ref >60)
GFR, EST AFRICAN AMERICAN: 51 mL/min — AB (ref >60)
Glucose, Bld: 94 mg/dL (ref 65–99)
POTASSIUM: 4.7 mmol/L (ref 3.5–5.1)
SODIUM: 138 mmol/L (ref 135–145)
Total Bilirubin: 0.6 mg/dL (ref 0.3–1.2)
Total Protein: 7.6 g/dL (ref 6.5–8.1)

## 2017-02-14 ENCOUNTER — Other Ambulatory Visit: Payer: Self-pay | Admitting: *Deleted

## 2017-02-14 DIAGNOSIS — C9001 Multiple myeloma in remission: Secondary | ICD-10-CM

## 2017-02-14 MED ORDER — LENALIDOMIDE 15 MG PO CAPS: ORAL_CAPSULE | ORAL | 0 refills | Status: DC

## 2017-02-28 ENCOUNTER — Inpatient Hospital Stay: Payer: Medicare HMO | Attending: Internal Medicine

## 2017-02-28 DIAGNOSIS — C9001 Multiple myeloma in remission: Secondary | ICD-10-CM | POA: Insufficient documentation

## 2017-02-28 LAB — COMPREHENSIVE METABOLIC PANEL
ALK PHOS: 47 U/L (ref 38–126)
ALT: 12 U/L — AB (ref 17–63)
ANION GAP: 3 — AB (ref 5–15)
AST: 14 U/L — ABNORMAL LOW (ref 15–41)
Albumin: 3.6 g/dL (ref 3.5–5.0)
BILIRUBIN TOTAL: 0.4 mg/dL (ref 0.3–1.2)
BUN: 17 mg/dL (ref 6–20)
CALCIUM: 8.5 mg/dL — AB (ref 8.9–10.3)
CO2: 24 mmol/L (ref 22–32)
CREATININE: 1.35 mg/dL — AB (ref 0.61–1.24)
Chloride: 110 mmol/L (ref 101–111)
GFR calc non Af Amer: 50 mL/min — ABNORMAL LOW (ref >60)
GFR, EST AFRICAN AMERICAN: 59 mL/min — AB (ref >60)
GLUCOSE: 98 mg/dL (ref 65–99)
Potassium: 4.5 mmol/L (ref 3.5–5.1)
Sodium: 137 mmol/L (ref 135–145)
Total Protein: 7.5 g/dL (ref 6.5–8.1)

## 2017-02-28 LAB — CBC WITH DIFFERENTIAL/PLATELET
BASOS ABS: 0 10*3/uL (ref 0–0.1)
BASOS PCT: 1 %
EOS ABS: 0.2 10*3/uL (ref 0–0.7)
Eosinophils Relative: 5 %
HEMATOCRIT: 39.8 % — AB (ref 40.0–52.0)
Hemoglobin: 13 g/dL (ref 13.0–18.0)
Lymphocytes Relative: 26 %
Lymphs Abs: 0.8 10*3/uL — ABNORMAL LOW (ref 1.0–3.6)
MCH: 31.3 pg (ref 26.0–34.0)
MCHC: 32.5 g/dL (ref 32.0–36.0)
MCV: 96.1 fL (ref 80.0–100.0)
MONO ABS: 0.3 10*3/uL (ref 0.2–1.0)
Monocytes Relative: 11 %
NEUTROS ABS: 1.8 10*3/uL (ref 1.4–6.5)
Neutrophils Relative %: 57 %
PLATELETS: 252 10*3/uL (ref 150–440)
RBC: 4.14 MIL/uL — ABNORMAL LOW (ref 4.40–5.90)
RDW: 16.7 % — AB (ref 11.5–14.5)
WBC: 3.1 10*3/uL — ABNORMAL LOW (ref 3.8–10.6)

## 2017-03-12 ENCOUNTER — Other Ambulatory Visit: Payer: Self-pay | Admitting: *Deleted

## 2017-03-12 DIAGNOSIS — C9001 Multiple myeloma in remission: Secondary | ICD-10-CM

## 2017-03-12 MED ORDER — LENALIDOMIDE 15 MG PO CAPS: ORAL_CAPSULE | ORAL | 0 refills | Status: DC

## 2017-04-04 ENCOUNTER — Inpatient Hospital Stay: Payer: Medicare HMO

## 2017-04-04 ENCOUNTER — Inpatient Hospital Stay (HOSPITAL_BASED_OUTPATIENT_CLINIC_OR_DEPARTMENT_OTHER): Payer: Medicare HMO | Admitting: Internal Medicine

## 2017-04-04 ENCOUNTER — Encounter: Payer: Self-pay | Admitting: Internal Medicine

## 2017-04-04 ENCOUNTER — Inpatient Hospital Stay: Payer: Medicare HMO | Attending: Internal Medicine

## 2017-04-04 VITALS — BP 148/74 | HR 59 | Temp 98.1°F | Resp 16 | Wt 242.0 lb

## 2017-04-04 DIAGNOSIS — E1122 Type 2 diabetes mellitus with diabetic chronic kidney disease: Secondary | ICD-10-CM | POA: Diagnosis not present

## 2017-04-04 DIAGNOSIS — I129 Hypertensive chronic kidney disease with stage 1 through stage 4 chronic kidney disease, or unspecified chronic kidney disease: Secondary | ICD-10-CM | POA: Insufficient documentation

## 2017-04-04 DIAGNOSIS — K219 Gastro-esophageal reflux disease without esophagitis: Secondary | ICD-10-CM | POA: Insufficient documentation

## 2017-04-04 DIAGNOSIS — E785 Hyperlipidemia, unspecified: Secondary | ICD-10-CM | POA: Diagnosis not present

## 2017-04-04 DIAGNOSIS — N189 Chronic kidney disease, unspecified: Secondary | ICD-10-CM | POA: Diagnosis not present

## 2017-04-04 DIAGNOSIS — C9001 Multiple myeloma in remission: Secondary | ICD-10-CM

## 2017-04-04 DIAGNOSIS — E079 Disorder of thyroid, unspecified: Secondary | ICD-10-CM | POA: Diagnosis not present

## 2017-04-04 DIAGNOSIS — Z7982 Long term (current) use of aspirin: Secondary | ICD-10-CM | POA: Insufficient documentation

## 2017-04-04 DIAGNOSIS — Z79899 Other long term (current) drug therapy: Secondary | ICD-10-CM | POA: Diagnosis not present

## 2017-04-04 LAB — CBC WITH DIFFERENTIAL/PLATELET
BASOS ABS: 0 10*3/uL (ref 0–0.1)
Basophils Relative: 1 %
EOS PCT: 7 %
Eosinophils Absolute: 0.2 10*3/uL (ref 0–0.7)
HEMATOCRIT: 36.8 % — AB (ref 40.0–52.0)
Hemoglobin: 12.3 g/dL — ABNORMAL LOW (ref 13.0–18.0)
LYMPHS ABS: 0.7 10*3/uL — AB (ref 1.0–3.6)
LYMPHS PCT: 21 %
MCH: 32 pg (ref 26.0–34.0)
MCHC: 33.6 g/dL (ref 32.0–36.0)
MCV: 95.4 fL (ref 80.0–100.0)
Monocytes Absolute: 0.5 10*3/uL (ref 0.2–1.0)
Monocytes Relative: 15 %
NEUTROS ABS: 1.9 10*3/uL (ref 1.4–6.5)
Neutrophils Relative %: 56 %
PLATELETS: 198 10*3/uL (ref 150–440)
RBC: 3.86 MIL/uL — ABNORMAL LOW (ref 4.40–5.90)
RDW: 16 % — ABNORMAL HIGH (ref 11.5–14.5)
WBC: 3.4 10*3/uL — AB (ref 3.8–10.6)

## 2017-04-04 LAB — COMPREHENSIVE METABOLIC PANEL
ALK PHOS: 52 U/L (ref 38–126)
ALT: 14 U/L — AB (ref 17–63)
AST: 18 U/L (ref 15–41)
Albumin: 3.2 g/dL — ABNORMAL LOW (ref 3.5–5.0)
Anion gap: 5 (ref 5–15)
BILIRUBIN TOTAL: 0.6 mg/dL (ref 0.3–1.2)
BUN: 19 mg/dL (ref 6–20)
CALCIUM: 8.6 mg/dL — AB (ref 8.9–10.3)
CO2: 24 mmol/L (ref 22–32)
CREATININE: 1.29 mg/dL — AB (ref 0.61–1.24)
Chloride: 111 mmol/L (ref 101–111)
GFR, EST NON AFRICAN AMERICAN: 53 mL/min — AB (ref >60)
Glucose, Bld: 124 mg/dL — ABNORMAL HIGH (ref 65–99)
Potassium: 4.1 mmol/L (ref 3.5–5.1)
Sodium: 140 mmol/L (ref 135–145)
TOTAL PROTEIN: 7 g/dL (ref 6.5–8.1)

## 2017-04-04 MED ORDER — SODIUM CHLORIDE 0.9 % IV SOLN
INTRAVENOUS | Status: DC
  Administered 2017-04-04: 12:00:00 via INTRAVENOUS
  Filled 2017-04-04: qty 1000

## 2017-04-04 MED ORDER — ZOLEDRONIC ACID 4 MG/5ML IV CONC
3.0000 mg | Freq: Once | INTRAVENOUS | Status: AC
  Administered 2017-04-04: 3 mg via INTRAVENOUS
  Filled 2017-04-04: qty 3.75

## 2017-04-04 NOTE — Progress Notes (Signed)
[place] OFFICE PROGRESS NOTE  Patient Care Team: Madelyn Brunner, MD as PCP - General (Internal Medicine) Druscilla Brownie, MD as Consulting Physician (Dermatology)   SUMMARY OF ONCOLOGIC HISTORY: Oncology History    # April 2014- MULTIPLE MYELOMA [IgG- 2.2gm/dl; 40-50% plasma cell; Cyto-N; FISH- gain of chr.9, 15 & ccnd1/11q13] s/p RVD x6 [sep 2014; BMBx- ~5% plasma cells]; Maint Rev-DEX-Zometa; March 2015-Stopped Dex Cont- Rev-Zometa; July 2016- Rev 50m; NOV 4th  2016-HOLD; [facility] declined BMT.   # Re-START FEB 2017 Rev 17m3 W- on & 1 w OFF.   # Zometa q 40M  # CKD [creat ~1.4; sec MM]; DM     Multiple myeloma in remission (HCM[place]  10/01/2015 Initial Diagnosis    Multiple myeloma in remission (HCB[place]       INTERVAL HISTORY:  A pleasant  [age]ear old male patient with above history of multiple myeloma currently on maintenance Revlimid is here for follow-up.   Patient denies any bone pain.  Denies any shortness of breath or cough.  No chest pain.  No tingling or numbness.  No rash.  No diarrhea.  Denies any jaw pain.  Denies any paresthesias.   REVIEW OF SYSTEMS:  A complete 10 point review of system is done which is negative except mentioned above/history of present illness.   PAST MEDICAL HISTORY :  Past Medical History:  Diagnosis Date  . Diabetes mellitus without complication (HCMcClenney T[place]  . GERD (gastroesophageal reflux disease)   . Hyperlipidemia   . Hypertension   . Multiple myeloma (HCLake Mohawk  . Obesity   . Thyroid cancer (HCFarson  . Thyroid disease     PAST SURGICAL HISTORY :   Past Surgical History:  Procedure Laterality Date  . BONE MARROW BIOPSY  2014  . CATARACT EXTRACTION BILATERAL W/ ANTERIOR VITRECTOMY      FAMILY HISTORY :   Family History  Problem Relation Age of Onset  . Cancer Father 6389. Diabetes Mother     SOCIAL HISTORY:   Social History   Tobacco Use  . Smoking status: Never Smoker  . Smokeless  tobacco: Never Used  Substance Use Topics  . Alcohol use: No  . Drug use: No    ALLERGIES:  has No Known Allergies.  MEDICATIONS:  Current Outpatient Medications  Medication Sig Dispense Refill  . acyclovir (ZOVIRAX) 400 MG tablet 400 mg 2 (two) times daily.    . Marland KitchenmLODipine (NORVASC) 10 MG tablet Take 10 mg by mouth daily.    . Marland Kitchenspirin EC 81 MG tablet Take 81 mg by mouth daily.    . Marland Kitchentorvastatin (LIPITOR) 20 MG tablet Take 20 mg by mouth daily.    . cloNIDine (CATAPRES) 0.1 MG tablet Take 0.1 mg by mouth 2 (two) times daily.    . Cyanocobalamin (RA VITAMIN B-12 TR) 1000 MCG TBCR Take by mouth.    . lenalidomide (REVLIMID) 15 MG capsule 1 capsule Daily for 3 weeks and then 1 week off 21 capsule 0  . levothyroxine (SYNTHROID) 200 MCG tablet Take 1 tablet by mouth daily.    . Marland Kitchenosartan (COZAAR) 100 MG tablet Take 100 mg by mouth daily.    . Glory RosebushELICA LANCETS [id number] Inject 1 Lancet as directed once daily.    . pioglitazone (ACTOS) 30 MG tablet Take 30 mg by mouth daily.    . potassium chloride SA (K-DUR,KLOR-CON) 20 MEQ tablet Take 20 mEq by mouth 2 (two)  times daily.   0  . quinapril (ACCUPRIL) 40 MG tablet Take 40 mg by mouth daily.    . sennosides-docusate sodium (SENOKOT-S) 8.6-50 MG tablet Take 2 tablets by mouth daily.     No current facility-administered medications for this visit.    Facility-Administered Medications Ordered in Other Visits  Medication Dose Route Frequency Provider Last Rate Last Dose  . 0.9 %  sodium chloride infusion   Intravenous Continuous Cammie Sickle, MD   Stopped at 12/11/14 1528    PHYSICAL EXAMINATION: ECOG PERFORMANCE STATUS: 0 - Asymptomatic  BP (!) 148/74 (BP Location: Left Arm, Patient Position: Sitting)   Pulse (!) 59   Temp 98.1 F (36.7 C) (Tympanic)   Resp 16   Wt 242 lb (109.8 kg)   BMI 34.72 kg/m   Filed Weights   04/04/17 1058  Weight: 242 lb (109.8 kg)    GENERAL: Well-nourished well-developed; Alert, no  distress and comfortable. He is alone.  No pallor or icterus OROPHARYNX: no thrush or ulceration; good dentition  NECK: supple, no masses felt LYMPH:  no palpable lymphadenopathy in the cervical, axillary or inguinal regions LUNGS: clear to auscultation and  No wheeze or crackles HEART/CVS: regular rate & rhythm and no murmurs; No lower extremity edema ABDOMEN:abdomen soft, non-tender and normal bowel sounds Musculoskeletal:no cyanosis of digits and no clubbing  PSYCH: alert & oriented x 3 with fluent speech NEURO: no focal motor/sensory deficits SKIN:  no rashes or significant lesions  LABORATORY DATA:  I have reviewed the data as listed    Component Value Date/Time   NA 140 04/04/2017 1036   NA 139 05/01/2014 1322   K 4.1 04/04/2017 1036   K 4.4 05/01/2014 1322   CL 111 04/04/2017 1036   CL 107 05/01/2014 1322   CO2 24 04/04/2017 1036   CO2 27 05/01/2014 1322   GLUCOSE 124 (H) 04/04/2017 1036   GLUCOSE 102 (H) 05/01/2014 1322   BUN 19 04/04/2017 1036   BUN 16 05/01/2014 1322   CREATININE 1.29 (H) 04/04/2017 1036   CREATININE 1.48 (H) 05/29/2014 0949   CALCIUM 8.6 (L) 04/04/2017 1036   CALCIUM 9.1 05/01/2014 1322   PROT 7.0 04/04/2017 1036   PROT 7.5 05/01/2014 1322   ALBUMIN 3.2 (L) 04/04/2017 1036   ALBUMIN 3.6 05/01/2014 1322   AST 18 04/04/2017 1036   AST 17 05/01/2014 1322   ALT 14 (L) 04/04/2017 1036   ALT 13 (L) 05/01/2014 1322   ALKPHOS 52 04/04/2017 1036   ALKPHOS 47 05/01/2014 1322   BILITOT 0.6 04/04/2017 1036   BILITOT 0.6 05/01/2014 1322   GFRNONAA 53 (L) 04/04/2017 1036   GFRNONAA 47 (L) 05/29/2014 0949   GFRAA >60 04/04/2017 1036   GFRAA 55 (L) 05/29/2014 0949    No results found for: SPEP, UPEP  Lab Results  Component Value Date   WBC 3.4 (L) 04/04/2017   NEUTROABS 1.9 04/04/2017   HGB 12.3 (L) 04/04/2017   HCT 36.8 (L) 04/04/2017   MCV 95.4 04/04/2017   PLT 198 04/04/2017      Chemistry      Component Value Date/Time   NA 140  04/04/2017 1036   NA 139 05/01/2014 1322   K 4.1 04/04/2017 1036   K 4.4 05/01/2014 1322   CL 111 04/04/2017 1036   CL 107 05/01/2014 1322   CO2 24 04/04/2017 1036   CO2 27 05/01/2014 1322   BUN 19 04/04/2017 1036   BUN 16 05/01/2014 1322  CREATININE 1.29 (H) 04/04/2017 1036   CREATININE 1.48 (H) 05/29/2014 0949      Component Value Date/Time   CALCIUM 8.6 (L) 04/04/2017 1036   CALCIUM 9.1 05/01/2014 1322   ALKPHOS 52 04/04/2017 1036   ALKPHOS 47 05/01/2014 1322   AST 18 04/04/2017 1036   AST 17 05/01/2014 1322   ALT 14 (L) 04/04/2017 1036   ALT 13 (L) 05/01/2014 1322   BILITOT 0.6 04/04/2017 1036   BILITOT 0.6 05/01/2014 1322        ASSESSMENT & PLAN:  Multiple myeloma in remission ([place]) Multiple myeloma currently on maintenance Revlimid 15 mg 3 w-On & 1 w Off; Patient tolerating maintenance Revlimid very well; no side effects noted.   # no concerns for clinical progression. Patient's most recent serum M protein end of Nov 2019- negative; serum free kappa/lambda light chain ratio- slightly abnormal.  Labs from today pending.  # Labs today reviewed;  acceptable for treatment [Zometa] today. Continue Revlimid 15 mg 3 weeks on and one-week off.  # Creatinine is 1.29/ ca 8.7- on  Zometa q 3 months; On calcium and vitamin D.   #  We'll follow up with me in 3 months/CBC CMP-few days prior/ Zometa.      Cammie Sickle, MD 04/04/2017 11:29 AM

## 2017-04-04 NOTE — Assessment & Plan Note (Addendum)
Multiple myeloma currently on maintenance Revlimid 15 mg 3 w-On & 1 w Off; Patient tolerating maintenance Revlimid very well; no side effects noted.   # no concerns for clinical progression. Patient's most recent serum M protein end of Nov 2019- negative; serum free kappa/lambda light chain ratio- slightly abnormal.  Labs from today pending.  # Labs today reviewed;  acceptable for treatment [Zometa] today. Continue Revlimid 15 mg 3 weeks on and one-week off.  # Creatinine is 1.29/ ca 8.7- on  Zometa q 3 months; On calcium and vitamin D.   #  We'll follow up with me in 3 months/CBC CMP-few days prior/ Zometa.

## 2017-04-05 LAB — KAPPA/LAMBDA LIGHT CHAINS
Kappa free light chain: 87.1 mg/L — ABNORMAL HIGH (ref 3.3–19.4)
Kappa, lambda light chain ratio: 1.8 — ABNORMAL HIGH (ref 0.26–1.65)
LAMDA FREE LIGHT CHAINS: 48.5 mg/L — AB (ref 5.7–26.3)

## 2017-04-06 LAB — MULTIPLE MYELOMA PANEL, SERUM
ALBUMIN/GLOB SERPL: 0.9 (ref 0.7–1.7)
Albumin SerPl Elph-Mcnc: 3.1 g/dL (ref 2.9–4.4)
Alpha 1: 0.2 g/dL (ref 0.0–0.4)
Alpha2 Glob SerPl Elph-Mcnc: 0.6 g/dL (ref 0.4–1.0)
B-Globulin SerPl Elph-Mcnc: 1.1 g/dL (ref 0.7–1.3)
Gamma Glob SerPl Elph-Mcnc: 1.6 g/dL (ref 0.4–1.8)
Globulin, Total: 3.5 g/dL (ref 2.2–3.9)
IGA: 582 mg/dL — AB (ref 61–437)
IGM (IMMUNOGLOBULIN M), SRM: 49 mg/dL (ref 15–143)
IgG (Immunoglobin G), Serum: 1649 mg/dL — ABNORMAL HIGH (ref 700–1600)
Total Protein ELP: 6.6 g/dL (ref 6.0–8.5)

## 2017-04-10 ENCOUNTER — Other Ambulatory Visit: Payer: Self-pay | Admitting: *Deleted

## 2017-04-10 DIAGNOSIS — C9001 Multiple myeloma in remission: Secondary | ICD-10-CM

## 2017-04-10 MED ORDER — LENALIDOMIDE 15 MG PO CAPS: ORAL_CAPSULE | ORAL | 0 refills | Status: DC

## 2017-05-14 ENCOUNTER — Other Ambulatory Visit: Payer: Self-pay | Admitting: *Deleted

## 2017-05-14 DIAGNOSIS — C9001 Multiple myeloma in remission: Secondary | ICD-10-CM

## 2017-05-15 MED ORDER — LENALIDOMIDE 15 MG PO CAPS: ORAL_CAPSULE | ORAL | 0 refills | Status: DC

## 2017-06-11 ENCOUNTER — Other Ambulatory Visit: Payer: Self-pay | Admitting: *Deleted

## 2017-06-11 DIAGNOSIS — C9001 Multiple myeloma in remission: Secondary | ICD-10-CM

## 2017-06-11 MED ORDER — LENALIDOMIDE 15 MG PO CAPS: ORAL_CAPSULE | ORAL | 0 refills | Status: DC

## 2017-06-27 ENCOUNTER — Inpatient Hospital Stay: Payer: Medicare HMO | Attending: Internal Medicine

## 2017-06-27 DIAGNOSIS — Z7982 Long term (current) use of aspirin: Secondary | ICD-10-CM | POA: Insufficient documentation

## 2017-06-27 DIAGNOSIS — Z85850 Personal history of malignant neoplasm of thyroid: Secondary | ICD-10-CM | POA: Insufficient documentation

## 2017-06-27 DIAGNOSIS — E785 Hyperlipidemia, unspecified: Secondary | ICD-10-CM | POA: Diagnosis not present

## 2017-06-27 DIAGNOSIS — I129 Hypertensive chronic kidney disease with stage 1 through stage 4 chronic kidney disease, or unspecified chronic kidney disease: Secondary | ICD-10-CM | POA: Insufficient documentation

## 2017-06-27 DIAGNOSIS — E1122 Type 2 diabetes mellitus with diabetic chronic kidney disease: Secondary | ICD-10-CM | POA: Diagnosis not present

## 2017-06-27 DIAGNOSIS — K219 Gastro-esophageal reflux disease without esophagitis: Secondary | ICD-10-CM | POA: Insufficient documentation

## 2017-06-27 DIAGNOSIS — Z79899 Other long term (current) drug therapy: Secondary | ICD-10-CM | POA: Diagnosis not present

## 2017-06-27 DIAGNOSIS — C9001 Multiple myeloma in remission: Secondary | ICD-10-CM | POA: Diagnosis present

## 2017-06-27 DIAGNOSIS — N189 Chronic kidney disease, unspecified: Secondary | ICD-10-CM | POA: Diagnosis not present

## 2017-06-27 LAB — COMPREHENSIVE METABOLIC PANEL
ALK PHOS: 54 U/L (ref 38–126)
ALT: 10 U/L — AB (ref 17–63)
AST: 14 U/L — AB (ref 15–41)
Albumin: 3.2 g/dL — ABNORMAL LOW (ref 3.5–5.0)
Anion gap: 5 (ref 5–15)
BUN: 17 mg/dL (ref 6–20)
CALCIUM: 8.8 mg/dL — AB (ref 8.9–10.3)
CHLORIDE: 109 mmol/L (ref 101–111)
CO2: 24 mmol/L (ref 22–32)
CREATININE: 1.43 mg/dL — AB (ref 0.61–1.24)
GFR calc non Af Amer: 47 mL/min — ABNORMAL LOW (ref >60)
GFR, EST AFRICAN AMERICAN: 55 mL/min — AB (ref >60)
Glucose, Bld: 139 mg/dL — ABNORMAL HIGH (ref 65–99)
Potassium: 3.8 mmol/L (ref 3.5–5.1)
SODIUM: 138 mmol/L (ref 135–145)
Total Bilirubin: 0.9 mg/dL (ref 0.3–1.2)
Total Protein: 7.4 g/dL (ref 6.5–8.1)

## 2017-06-27 LAB — CBC WITH DIFFERENTIAL/PLATELET
BASOS PCT: 1 %
Basophils Absolute: 0 10*3/uL (ref 0–0.1)
EOS ABS: 0.1 10*3/uL (ref 0–0.7)
EOS PCT: 2 %
HCT: 36.3 % — ABNORMAL LOW (ref 40.0–52.0)
HEMOGLOBIN: 12.3 g/dL — AB (ref 13.0–18.0)
LYMPHS ABS: 0.7 10*3/uL — AB (ref 1.0–3.6)
Lymphocytes Relative: 15 %
MCH: 32.7 pg (ref 26.0–34.0)
MCHC: 33.8 g/dL (ref 32.0–36.0)
MCV: 96.9 fL (ref 80.0–100.0)
MONO ABS: 0.6 10*3/uL (ref 0.2–1.0)
MONOS PCT: 13 %
NEUTROS PCT: 69 %
Neutro Abs: 3.5 10*3/uL (ref 1.4–6.5)
PLATELETS: 206 10*3/uL (ref 150–440)
RBC: 3.75 MIL/uL — ABNORMAL LOW (ref 4.40–5.90)
RDW: 16.5 % — AB (ref 11.5–14.5)
WBC: 5 10*3/uL (ref 3.8–10.6)

## 2017-07-03 ENCOUNTER — Other Ambulatory Visit: Payer: Self-pay | Admitting: *Deleted

## 2017-07-03 DIAGNOSIS — C9001 Multiple myeloma in remission: Secondary | ICD-10-CM

## 2017-07-04 ENCOUNTER — Inpatient Hospital Stay: Payer: Medicare HMO

## 2017-07-04 ENCOUNTER — Inpatient Hospital Stay (HOSPITAL_BASED_OUTPATIENT_CLINIC_OR_DEPARTMENT_OTHER): Payer: Medicare HMO | Admitting: Internal Medicine

## 2017-07-04 ENCOUNTER — Other Ambulatory Visit: Payer: Self-pay

## 2017-07-04 ENCOUNTER — Encounter: Payer: Self-pay | Admitting: Internal Medicine

## 2017-07-04 VITALS — BP 134/78 | HR 60 | Temp 98.0°F | Ht 70.0 in | Wt 243.0 lb

## 2017-07-04 DIAGNOSIS — E785 Hyperlipidemia, unspecified: Secondary | ICD-10-CM

## 2017-07-04 DIAGNOSIS — E1122 Type 2 diabetes mellitus with diabetic chronic kidney disease: Secondary | ICD-10-CM

## 2017-07-04 DIAGNOSIS — C9001 Multiple myeloma in remission: Secondary | ICD-10-CM | POA: Diagnosis not present

## 2017-07-04 DIAGNOSIS — I129 Hypertensive chronic kidney disease with stage 1 through stage 4 chronic kidney disease, or unspecified chronic kidney disease: Secondary | ICD-10-CM

## 2017-07-04 DIAGNOSIS — K219 Gastro-esophageal reflux disease without esophagitis: Secondary | ICD-10-CM

## 2017-07-04 DIAGNOSIS — Z85850 Personal history of malignant neoplasm of thyroid: Secondary | ICD-10-CM

## 2017-07-04 DIAGNOSIS — N189 Chronic kidney disease, unspecified: Secondary | ICD-10-CM | POA: Diagnosis not present

## 2017-07-04 DIAGNOSIS — Z7982 Long term (current) use of aspirin: Secondary | ICD-10-CM

## 2017-07-04 DIAGNOSIS — Z79899 Other long term (current) drug therapy: Secondary | ICD-10-CM

## 2017-07-04 MED ORDER — SODIUM CHLORIDE 0.9 % IV SOLN
Freq: Once | INTRAVENOUS | Status: AC
  Administered 2017-07-04: 14:00:00 via INTRAVENOUS
  Filled 2017-07-04: qty 1000

## 2017-07-04 MED ORDER — ZOLEDRONIC ACID 4 MG/5ML IV CONC
3.0000 mg | Freq: Once | INTRAVENOUS | Status: AC
  Administered 2017-07-04: 3 mg via INTRAVENOUS
  Filled 2017-07-04: qty 3.75

## 2017-07-04 NOTE — Progress Notes (Signed)
[place] OFFICE PROGRESS NOTE  Patient Care Team: Madelyn Brunner, MD as PCP - General (Internal Medicine) Druscilla Brownie, MD as Consulting Physician (Dermatology)  Cancer Staging No matching staging information was found for the patient.   Oncology History    # April 2014- MULTIPLE MYELOMA [IgG- 2.2gm/dl; 40-50% plasma cell; Cyto-N; FISH- gain of chr.9, 15 & ccnd1/11q13] s/p RVD x6 [sep 2014; BMBx- ~5% plasma cells]; Maint Rev-DEX-Zometa; March 2015-Stopped Dex Cont- Rev-Zometa; July 2016- Rev 51m; NOV 4th  2016-HOLD; [facility] declined BMT.   # Re-START FEB 2017 Rev 147m3 W- on & 1 w OFF.   # Zometa q 50M  # CKD [creat ~1.4; sec MM]; DM  -------------------------------------------------------------------------   DIAGNOSIS: [2[name] MULTIPLE MYELOMA  GOALS: control  CURRENT/MOST RECENT THERAPY-       Multiple [place]oma in remission ([facility]  10/01/2015 Initial Diagnosis    Multiple myeloma in remission ([facility]        INTERVAL HISTORY:  [name]89.o.  male pleasant patient above history of multiple myeloma on Revlimid maintenance is here for follow-up.  Patient denies any tingling or numbness preoperative spine no weight loss.  Review of Systems  Constitutional: Negative for chills, diaphoresis, fever, malaise/fatigue and weight loss.  HENT: Negative for nosebleeds and sore throat.   Eyes: Negative for double vision.  Respiratory: Negative for cough, hemoptysis, sputum production, shortness of breath and wheezing.   Cardiovascular: Negative for chest pain, palpitations, orthopnea and leg swelling.  Gastrointestinal: Negative for abdominal pain, blood in stool, constipation, diarrhea, heartburn, melena, nausea and vomiting.  Genitourinary: Negative for dysuria, frequency and urgency.  Musculoskeletal: Negative for back pain and joint pain.  Skin: Negative.  Negative for itching and rash.  Neurological: Negative for  dizziness, tingling, focal weakness, weakness and headaches.  Endo/Heme/Allergies: Does not bruise/bleed easily.  Psychiatric/Behavioral: Negative for depression. The patient is not nervous/anxious and does not have insomnia.       PAST MEDICAL HISTORY :  Past Medical History:  Diagnosis Date  . Diabetes mellitus without complication ([facility]  . GERD (gastroesophageal reflux disease)   . Hyperlipidemia   . Hypertension   . Multiple myeloma ([facility]  . Obesity   . Thyroid cancer ([facility]  . Thyroid disease     PAST SURGICAL HISTORY :   Past Surgical History:  Procedure Laterality Date  . BONE MARROW BIOPSY  2014  . CATARACT EXTRACTION BILATERAL W/ ANTERIOR VITRECTOMY      FAMILY HISTORY :   Family History  Problem Relation Age of Onset  . Cancer Father 637. Diabetes Mother     SOCIAL HISTORY:   Social History   Tobacco Use  . Smoking status: Never Smoker  . Smokeless tobacco: Never Used  Substance Use Topics  . Alcohol use: No  . Drug use: No    ALLERGIES:  has No Known Allergies.  MEDICATIONS:  Current Outpatient Medications  Medication Sig Dispense Refill  . acyclovir (ZOVIRAX) 400 MG tablet 400 mg 2 (two) times daily.    . Marland KitchenmLODipine (NORVASC) 10 MG tablet Take 10 mg by mouth daily.    . Marland Kitchenspirin EC 81 MG tablet Take 81 mg by mouth daily.    . Marland Kitchentorvastatin (LIPITOR) 20 MG tablet Take 20 mg by mouth daily.    . cloNIDine (CATAPRES) 0.1 MG tablet Take 0.1 mg by mouth 2 (two) times daily.    . Cyanocobalamin (RA  VITAMIN B-12 TR) 1000 MCG TBCR Take by mouth.    . lenalidomide (REVLIMID) 15 MG capsule 1 capsule Daily for 3 weeks and then 1 week off 21 capsule 0  . levothyroxine (SYNTHROID) 137 MCG tablet Take 1 tablet by mouth daily.     [name] losartan (COZAAR) 100 MG tablet Take 100 mg by mouth daily.    [name] DELICA LANCETS 24M MISC Inject 1 Lancet as directed once daily.    . pioglitazone (ACTOS) 30 MG tablet Take 30 mg by mouth daily.    . potassium chloride SA  (K-DUR,KLOR-CON) 20 MEQ tablet Take 20 mEq by mouth 2 (two) times daily.   0  . quinapril (ACCUPRIL) 40 MG tablet Take 40 mg by mouth daily.    . sennosides-docusate sodium (SENOKOT-S) 8.6-50 MG tablet Take 2 tablets by mouth daily.     No current facility-administered medications for this visit.    Facility-Administered Medications Ordered in Other Visits  Medication Dose Route Frequency Provider Last Rate Last Dose  . 0.9 %  sodium chloride infusion   Intravenous Continuous Cammie Sickle, MD   Stopped at 12/11/14 1528    PHYSICAL EXAMINATION: ECOG PERFORMANCE STATUS: 0 - Asymptomatic  BP 134/78   Pulse 60   Temp 98 F (36.7 C) (Tympanic)   Ht '5\' 10"'  (1.778 m)   Wt 243 lb (110.2 kg)   BMI 34.87 kg/m   Filed Weights   07/04/17 1333  Weight: 243 lb (110.2 kg)    GENERAL: Well-nourished well-developed; Alert, no distress and comfortable.  He is alone. EYES: no pallor or icterus OROPHARYNX: no thrush or ulceration; NECK: supple; no lymph nodes felt. LYMPH:  no palpable lymphadenopathy in the axillary or inguinal regions LUNGS: Decreased breath sounds auscultation bilaterally. No wheeze or crackles HEART/CVS: regular rate & rhythm and no murmurs; No lower extremity edema ABDOMEN:abdomen soft, non-tender and normal bowel sounds. No hepatomegaly or splenomegaly.  Musculoskeletal:no cyanosis of digits and no clubbing  PSYCH: alert & oriented x 3 with fluent speech NEURO: no focal motor/sensory deficits SKIN:  no rashes or significant lesions  LABORATORY DATA:  I have reviewed the data as listed    Component Value Date/Time   NA 138 06/27/2017 1118   NA 139 05/01/2014 1322   K 3.8 06/27/2017 1118   K 4.4 05/01/2014 1322   CL 109 06/27/2017 1118   CL 107 05/01/2014 1322   CO2 24 06/27/2017 1118   CO2 27 05/01/2014 1322   GLUCOSE 139 (H) 06/27/2017 1118   GLUCOSE 102 (H) 05/01/2014 1322   BUN 17 06/27/2017 1118   BUN 16 05/01/2014 1322   CREATININE 1.43 (H)  06/27/2017 1118   CREATININE 1.48 (H) 05/29/2014 0949   CALCIUM 8.8 (L) 06/27/2017 1118   CALCIUM 9.1 05/01/2014 1322   PROT 7.4 06/27/2017 1118   PROT 7.5 05/01/2014 1322   ALBUMIN 3.2 (L) 06/27/2017 1118   ALBUMIN 3.6 05/01/2014 1322   AST 14 (L) 06/27/2017 1118   AST 17 05/01/2014 1322   ALT 10 (L) 06/27/2017 1118   ALT 13 (L) 05/01/2014 1322   ALKPHOS 54 06/27/2017 1118   ALKPHOS 47 05/01/2014 1322   BILITOT 0.9 06/27/2017 1118   BILITOT 0.6 05/01/2014 1322   GFRNONAA 47 (L) 06/27/2017 1118   GFRNONAA 47 (L) 05/29/2014 0949   GFRAA 55 (L) 06/27/2017 1118   GFRAA 55 (L) 05/29/2014 0949    No results found for: SPEP, UPEP  Lab Results  Component Value Date  WBC 5.0 06/27/2017   NEUTROABS 3.5 06/27/2017   HGB 12.3 (L) 06/27/2017   HCT 36.3 (L) 06/27/2017   MCV 96.9 06/27/2017   PLT 206 06/27/2017      Chemistry      Component Value Date/Time   NA 138 06/27/2017 1118   NA 139 05/01/2014 1322   K 3.8 06/27/2017 1118   K 4.4 05/01/2014 1322   CL 109 06/27/2017 1118   CL 107 05/01/2014 1322   CO2 24 06/27/2017 1118   CO2 27 05/01/2014 1322   BUN 17 06/27/2017 1118   BUN 16 05/01/2014 1322   CREATININE 1.43 (H) 06/27/2017 1118   CREATININE 1.48 (H) 05/29/2014 0949      Component Value Date/Time   CALCIUM 8.8 (L) 06/27/2017 1118   CALCIUM 9.1 05/01/2014 1322   ALKPHOS 54 06/27/2017 1118   ALKPHOS 47 05/01/2014 1322   AST 14 (L) 06/27/2017 1118   AST 17 05/01/2014 1322   ALT 10 (L) 06/27/2017 1118   ALT 13 (L) 05/01/2014 1322   BILITOT 0.9 06/27/2017 1118   BILITOT 0.6 05/01/2014 1322       RADIOGRAPHIC STUDIES: I have personally reviewed the radiological images as listed and agreed with the findings in the report. No results found.   ASSESSMENT & PLAN:  Multiple myeloma in remission ([place]) #Multiple myeloma-on maintenance Revlimid 15 mg 3 weeks/1 week off.  Stable  #No clinical evidence of progression; myeloma panel February 2019-unremarkable.  Labs  from today pending.  Continue Revlimid.  #Chronic kidney disease creatinine 1.4-1.5; stable.  #Bone modifying agent/multiple myeloma-continue Zometa; no side effects noted.  Calcium 8.8.  Recommend adding vitamin D to his calcium supplementation.   #  We'll follow up with me in 3 months/CBC CMP/MM panel; K/L light chains-few days prior/ Zometa.   Orders Placed This Encounter  Procedures  . CBC with Differential    Standing Status:   Future    Standing Expiration Date:   07/05/2018  . Comprehensive metabolic panel    Standing Status:   Future    Standing Expiration Date:   07/05/2018  . Multiple Myeloma Panel (SPEP&IFE w/QIG)    Standing Status:   Future    Standing Expiration Date:   07/05/2018  . Kappa/lambda light chains    Standing Status:   Future    Standing Expiration Date:   07/05/2018   All questions were answered. The patient knows to call the clinic with any problems, questions or concerns.      Cammie Sickle, MD 07/04/2017 2:13 PM

## 2017-07-04 NOTE — Assessment & Plan Note (Addendum)
#  Multiple myeloma-on maintenance Revlimid 15 mg 3 weeks/1 week off.  Stable  #No clinical evidence of progression; myeloma panel February 2019-unremarkable.  Labs from today pending.  Continue Revlimid.  #Chronic kidney disease creatinine 1.4-1.5; stable.  #Bone modifying agent/multiple myeloma-continue Zometa; no side effects noted.  Calcium 8.8.  Recommend adding vitamin D to his calcium supplementation.   #  We'll follow up with me in 3 months/CBC CMP/MM panel; K/L light chains-few days prior/ Zometa.

## 2017-07-04 NOTE — Progress Notes (Signed)
Patient will start week break on 6/1. Patient has not missed any dosing. He denies any complaints with the revlimid.

## 2017-07-04 NOTE — Progress Notes (Signed)
Per Nira Conn RN per Dr. Rogue Bussing okay to proceed with Creatinine of 1.43 and Calcium of 8.8 from 06/27/17 labs.

## 2017-07-05 LAB — KAPPA/LAMBDA LIGHT CHAINS
KAPPA, LAMDA LIGHT CHAIN RATIO: 1.78 — AB (ref 0.26–1.65)
Kappa free light chain: 139.4 mg/L — ABNORMAL HIGH (ref 3.3–19.4)
Lambda free light chains: 78.1 mg/L — ABNORMAL HIGH (ref 5.7–26.3)

## 2017-07-08 LAB — MULTIPLE MYELOMA PANEL, SERUM
Albumin SerPl Elph-Mcnc: 2.8 g/dL — ABNORMAL LOW (ref 2.9–4.4)
Albumin/Glob SerPl: 0.7 (ref 0.7–1.7)
Alpha 1: 0.3 g/dL (ref 0.0–0.4)
Alpha2 Glob SerPl Elph-Mcnc: 0.9 g/dL (ref 0.4–1.0)
B-GLOBULIN SERPL ELPH-MCNC: 1.3 g/dL (ref 0.7–1.3)
Gamma Glob SerPl Elph-Mcnc: 1.8 g/dL (ref 0.4–1.8)
Globulin, Total: 4.3 g/dL — ABNORMAL HIGH (ref 2.2–3.9)
IGM (IMMUNOGLOBULIN M), SRM: 69 mg/dL (ref 15–143)
IgA: 708 mg/dL — ABNORMAL HIGH (ref 61–437)
IgG (Immunoglobin G), Serum: 1850 mg/dL — ABNORMAL HIGH (ref 700–1600)
TOTAL PROTEIN ELP: 7.1 g/dL (ref 6.0–8.5)

## 2017-07-11 ENCOUNTER — Other Ambulatory Visit: Payer: Self-pay | Admitting: *Deleted

## 2017-07-11 DIAGNOSIS — C9001 Multiple myeloma in remission: Secondary | ICD-10-CM

## 2017-07-11 MED ORDER — LENALIDOMIDE 15 MG PO CAPS
ORAL_CAPSULE | ORAL | 0 refills | Status: DC
Start: 2017-07-11 — End: 2017-08-03

## 2017-08-03 ENCOUNTER — Other Ambulatory Visit: Payer: Self-pay | Admitting: *Deleted

## 2017-08-03 DIAGNOSIS — C9001 Multiple myeloma in remission: Secondary | ICD-10-CM

## 2017-08-03 MED ORDER — LENALIDOMIDE 15 MG PO CAPS: ORAL_CAPSULE | ORAL | 0 refills | Status: DC

## 2017-08-14 ENCOUNTER — Other Ambulatory Visit: Payer: Self-pay | Admitting: *Deleted

## 2017-08-14 DIAGNOSIS — C9001 Multiple myeloma in remission: Secondary | ICD-10-CM

## 2017-09-02 IMAGING — US US EXTREM LOW VENOUS*R*
1 series · 13 of 24 positions shown · non-contrast
Comparison: None.

CLINICAL DATA: 70-year-old male with palpable "knots" right calf
area. Multiple myeloma on REVLIMID chemotherapy. Initial encounter.



[Series 1: us extrem low venous*right* · 0.07mm/px · 13 of 39 slices shown]
[im 1/39]
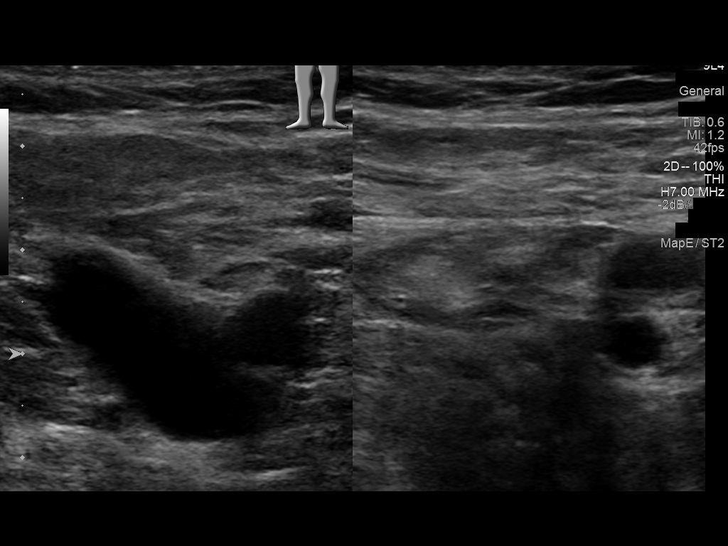
[im 4/39]
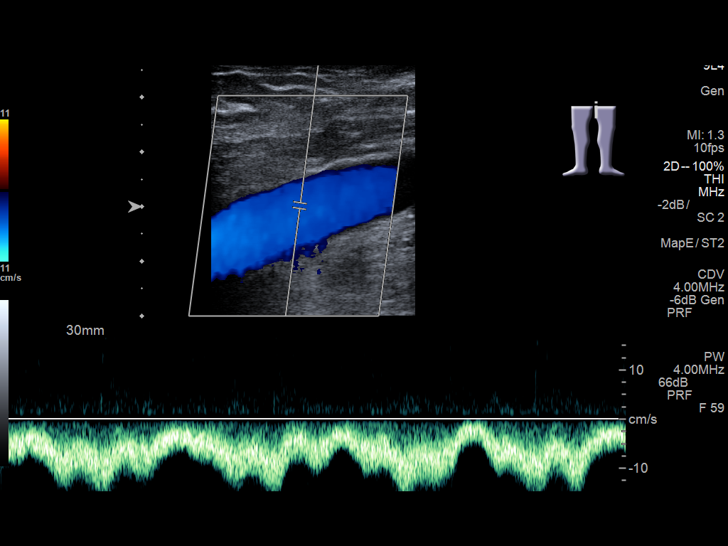
[im 7/39]
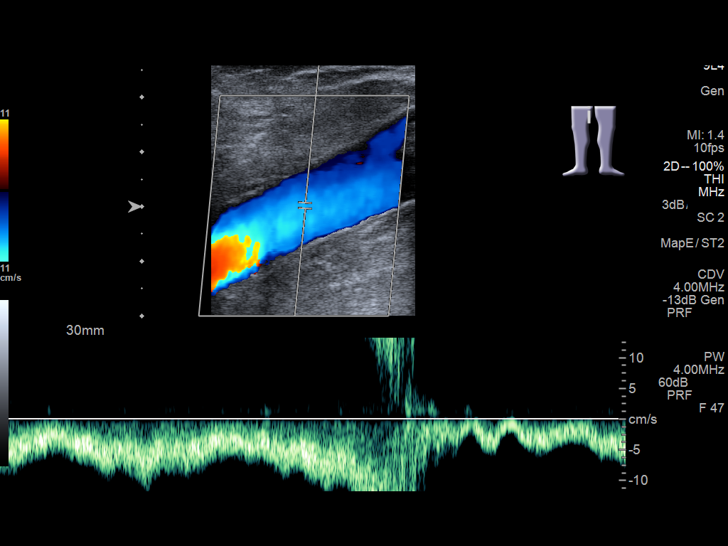
[im 10/39]
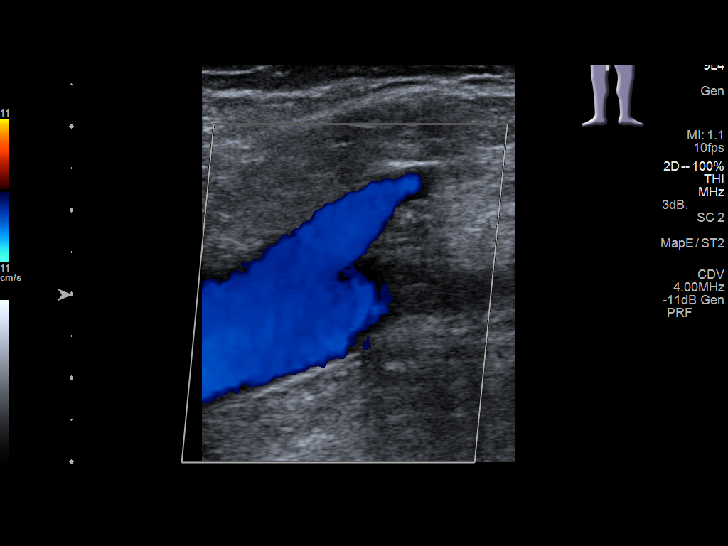
[im 14/39]
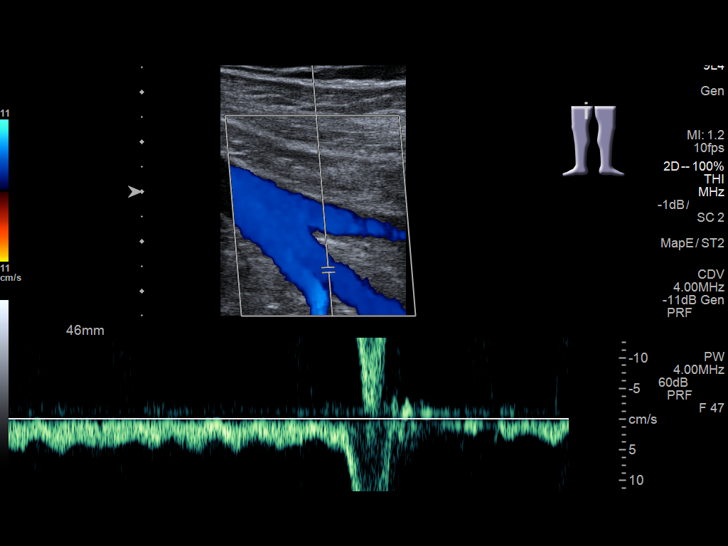
[im 17/39]
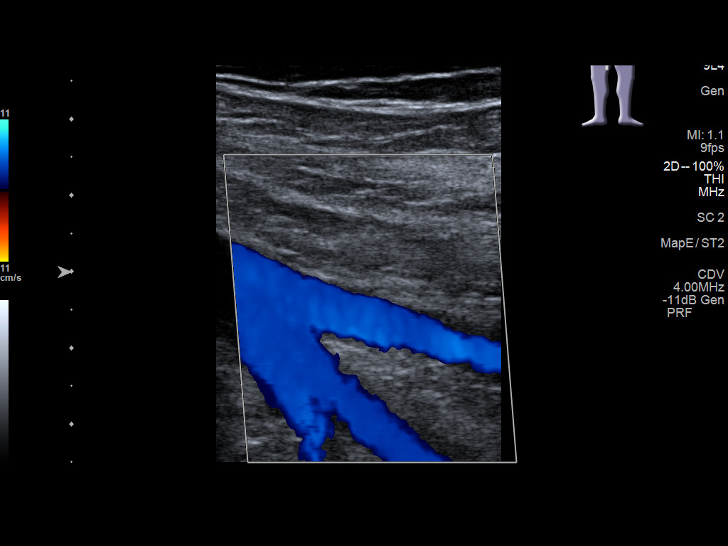
[im 20/39]
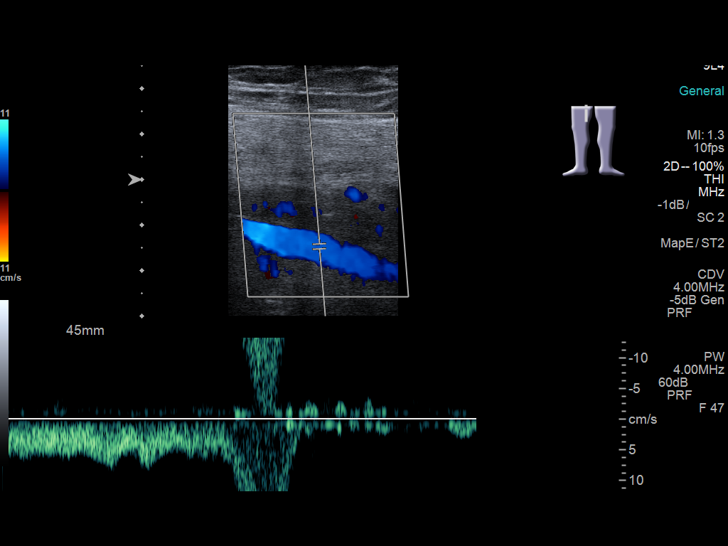
[im 22/39]
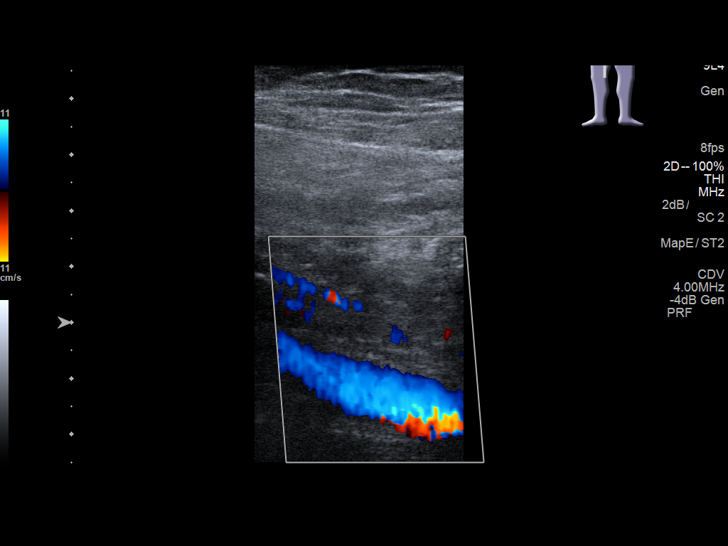
[im 25/39]
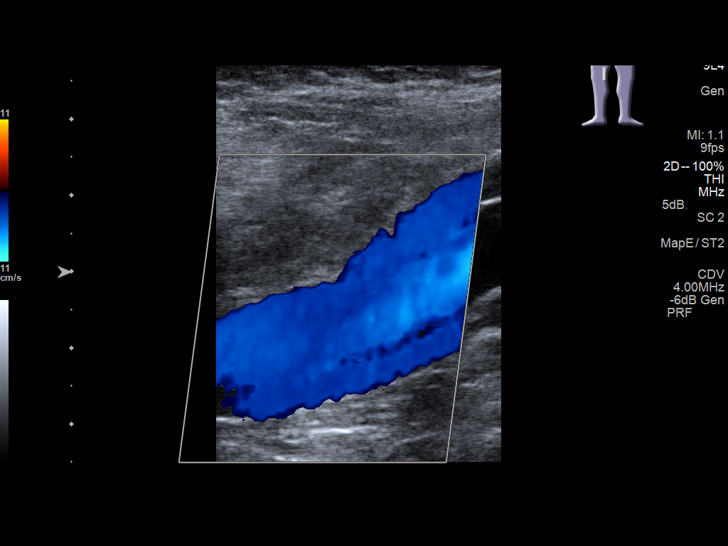
[im 29/39]
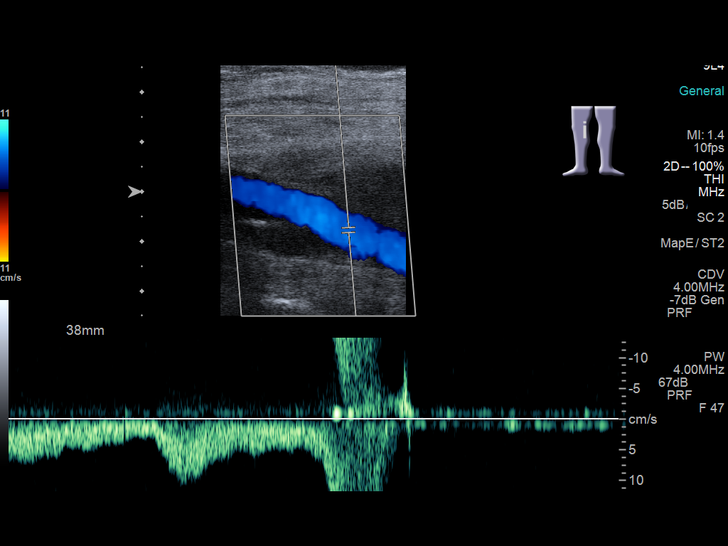
[im 32/39]
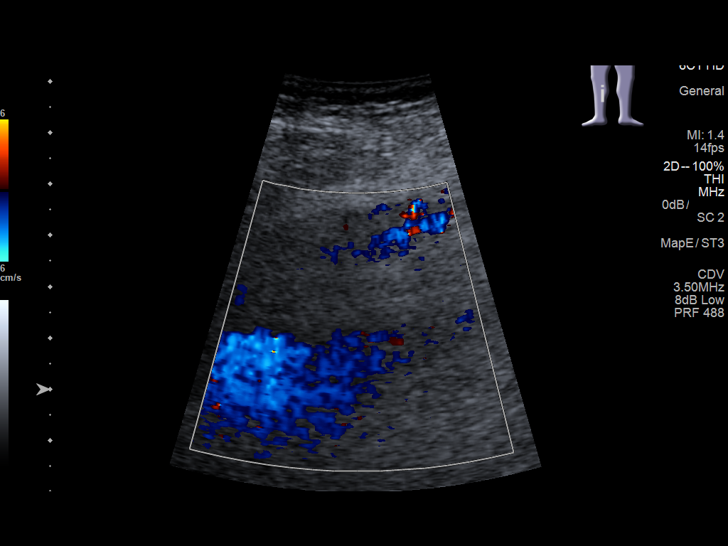
[im 35/39]
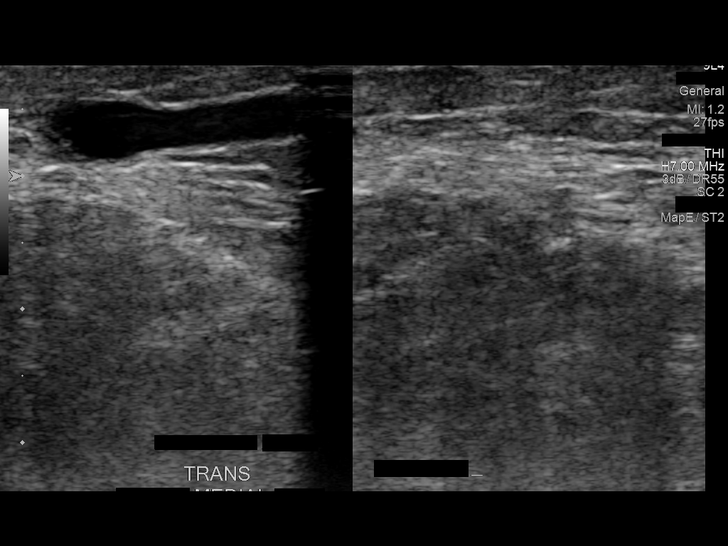
[im 39/39]
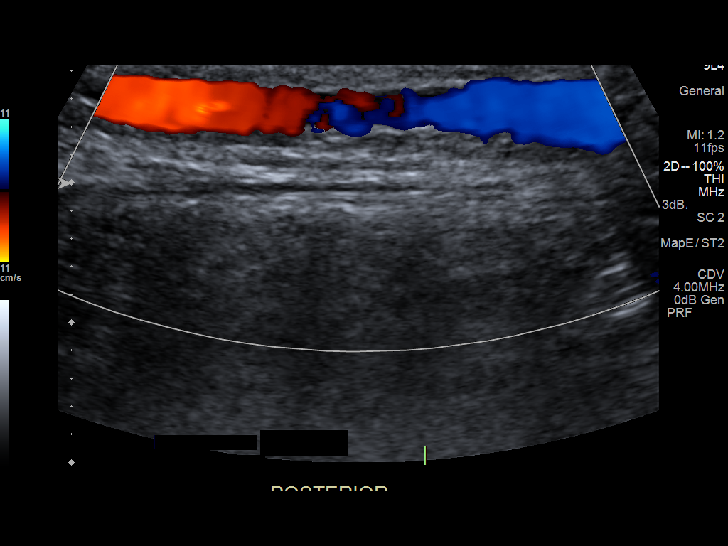

[13 of 24 positions shown; findings below may reference images not displayed]

FINDINGS: Contralateral Common Femoral Vein: Respiratory phasicity is normal
and symmetric with the symptomatic side. No evidence of thrombus.
Normal compressibility.

Common Femoral Vein: No evidence of thrombus. Normal
compressibility, respiratory phasicity and response to augmentation.

Saphenofemoral Junction: No evidence of thrombus. Normal
compressibility and flow on color Doppler imaging.

Profunda Femoral Vein: No evidence of thrombus. Normal
compressibility and flow on color Doppler imaging.

Femoral Vein: No evidence of thrombus. Normal compressibility,
respiratory phasicity and response to augmentation.

Popliteal Vein: No evidence of thrombus. Normal compressibility,
respiratory phasicity and response to augmentation.

Calf Veins: No evidence of thrombus in the visible calf veins.

Venous Reflux:  None.

Other Findings: Imaging of the palpable area demonstrates dilated
superficial veins in the lower medial calf which are normally
compressible and demonstrate flow on color Doppler (images 36
through 41).
IMPRESSION: 1.  No evidence of right lower extremity deep venous thrombosis.
2. Superficial venous varicosities at the palpable area of concern
which appear to be patent; no superficial thrombophlebitis
identified.

## 2017-09-07 ENCOUNTER — Other Ambulatory Visit: Payer: Self-pay | Admitting: *Deleted

## 2017-09-07 DIAGNOSIS — C9001 Multiple myeloma in remission: Secondary | ICD-10-CM

## 2017-09-07 MED ORDER — LENALIDOMIDE 15 MG PO CAPS: ORAL_CAPSULE | ORAL | 0 refills | Status: DC

## 2017-09-11 ENCOUNTER — Other Ambulatory Visit: Payer: Self-pay | Admitting: *Deleted

## 2017-09-11 NOTE — Telephone Encounter (Signed)
This nt on 09/07/17

## 2017-10-03 ENCOUNTER — Inpatient Hospital Stay (HOSPITAL_BASED_OUTPATIENT_CLINIC_OR_DEPARTMENT_OTHER): Payer: Medicare HMO | Admitting: Internal Medicine

## 2017-10-03 ENCOUNTER — Inpatient Hospital Stay: Payer: Medicare HMO | Attending: Internal Medicine

## 2017-10-03 ENCOUNTER — Inpatient Hospital Stay: Payer: Medicare HMO

## 2017-10-03 DIAGNOSIS — Z85850 Personal history of malignant neoplasm of thyroid: Secondary | ICD-10-CM

## 2017-10-03 DIAGNOSIS — Z7982 Long term (current) use of aspirin: Secondary | ICD-10-CM | POA: Insufficient documentation

## 2017-10-03 DIAGNOSIS — C9001 Multiple myeloma in remission: Secondary | ICD-10-CM

## 2017-10-03 DIAGNOSIS — K219 Gastro-esophageal reflux disease without esophagitis: Secondary | ICD-10-CM | POA: Diagnosis not present

## 2017-10-03 DIAGNOSIS — Z79899 Other long term (current) drug therapy: Secondary | ICD-10-CM | POA: Insufficient documentation

## 2017-10-03 DIAGNOSIS — N189 Chronic kidney disease, unspecified: Secondary | ICD-10-CM | POA: Diagnosis not present

## 2017-10-03 DIAGNOSIS — I129 Hypertensive chronic kidney disease with stage 1 through stage 4 chronic kidney disease, or unspecified chronic kidney disease: Secondary | ICD-10-CM | POA: Diagnosis not present

## 2017-10-03 DIAGNOSIS — E1122 Type 2 diabetes mellitus with diabetic chronic kidney disease: Secondary | ICD-10-CM

## 2017-10-03 DIAGNOSIS — E785 Hyperlipidemia, unspecified: Secondary | ICD-10-CM

## 2017-10-03 LAB — CBC WITH DIFFERENTIAL/PLATELET
BASOS PCT: 1 %
Basophils Absolute: 0 10*3/uL (ref 0–0.1)
Eosinophils Absolute: 0.2 10*3/uL (ref 0–0.7)
Eosinophils Relative: 8 %
HEMATOCRIT: 37 % — AB (ref 40.0–52.0)
Hemoglobin: 12.3 g/dL — ABNORMAL LOW (ref 13.0–18.0)
LYMPHS ABS: 0.7 10*3/uL — AB (ref 1.0–3.6)
Lymphocytes Relative: 23 %
MCH: 32.7 pg (ref 26.0–34.0)
MCHC: 33.3 g/dL (ref 32.0–36.0)
MCV: 98.1 fL (ref 80.0–100.0)
MONOS PCT: 16 %
Monocytes Absolute: 0.5 10*3/uL (ref 0.2–1.0)
NEUTROS ABS: 1.6 10*3/uL (ref 1.4–6.5)
NEUTROS PCT: 52 %
Platelets: 174 10*3/uL (ref 150–440)
RBC: 3.77 MIL/uL — AB (ref 4.40–5.90)
RDW: 16.8 % — ABNORMAL HIGH (ref 11.5–14.5)
WBC: 3.1 10*3/uL — AB (ref 3.8–10.6)

## 2017-10-03 LAB — COMPREHENSIVE METABOLIC PANEL
ALT: 10 U/L (ref 0–44)
AST: 15 U/L (ref 15–41)
Albumin: 3.5 g/dL (ref 3.5–5.0)
Alkaline Phosphatase: 44 U/L (ref 38–126)
Anion gap: 5 (ref 5–15)
BUN: 15 mg/dL (ref 8–23)
CALCIUM: 8.7 mg/dL — AB (ref 8.9–10.3)
CHLORIDE: 109 mmol/L (ref 98–111)
CO2: 25 mmol/L (ref 22–32)
Creatinine, Ser: 1.33 mg/dL — ABNORMAL HIGH (ref 0.61–1.24)
GFR, EST AFRICAN AMERICAN: 60 mL/min — AB (ref >60)
GFR, EST NON AFRICAN AMERICAN: 51 mL/min — AB (ref >60)
Glucose, Bld: 115 mg/dL — ABNORMAL HIGH (ref 70–99)
Potassium: 4.2 mmol/L (ref 3.5–5.1)
SODIUM: 139 mmol/L (ref 135–145)
Total Bilirubin: 0.6 mg/dL (ref 0.3–1.2)
Total Protein: 7.1 g/dL (ref 6.5–8.1)

## 2017-10-03 MED ORDER — LENALIDOMIDE 15 MG PO CAPS: ORAL_CAPSULE | ORAL | 0 refills | Status: DC

## 2017-10-03 MED ORDER — SODIUM CHLORIDE 0.9 % IV SOLN
INTRAVENOUS | Status: DC
  Administered 2017-10-03: 14:00:00 via INTRAVENOUS
  Filled 2017-10-03: qty 250

## 2017-10-03 MED ORDER — ZOLEDRONIC ACID 4 MG/5ML IV CONC
3.0000 mg | Freq: Once | INTRAVENOUS | Status: AC
  Administered 2017-10-03: 3 mg via INTRAVENOUS
  Filled 2017-10-03: qty 3.75

## 2017-10-03 NOTE — Progress Notes (Signed)
[facility]  Patient Care Team: Alan Brunner, MD as PCP - General (Internal Medicine) Alan Brownie, MD as Consulting Physician (Dermatology)  Cancer Staging No matching staging information was found for the patient.   Oncology History    # April 2014- MULTIPLE MYELOMA [IgG- 2.2gm/dl; 40-50% plasma cell; Cyto-N; FISH- gain of chr.9, 15 & ccnd1/11q13] s/p RVD x6 [sep 2014; BMBx- ~5% plasma cells]; Maint Rev-DEX-Zometa; March 2015-Stopped Dex Cont- Rev-Zometa; July 2016- Rev 80m; NOV 4th  2016-HOLD; [facility] declined BMT.   # Re-START FEB 2017 Rev 167m3 W- on & 1 w OFF.   # Zometa q 84M  # CKD [creat ~1.4; sec MM]; DM  -------------------------------------------------------------------------   DIAGNOSIS: [2Darrin.Alexander MULTIPLE MYELOMA  GOALS: control  CURRENT/MOST RECENT THERAPY- REVLIMID Maintenance [Feb 2017]      [name]  10/01/2015 Initial Diagnosis    [name]      INTERVAL HISTORY:  [name]37.o.  male pleasant patient above history of multiple myeloma currently on Revlimid maintenance is here for follow-up.  No fever no chills.  No nausea no vomiting.  No swelling in the legs.  No worsening back pain.   Review of Systems  Constitutional: Negative for chills, diaphoresis, fever, malaise/fatigue and weight loss.  HENT: Negative for nosebleeds and sore throat.   Eyes: Negative for double vision.  Respiratory: Negative for cough, hemoptysis, sputum production, shortness of breath and wheezing.   Cardiovascular: Negative for chest pain, palpitations, orthopnea and leg swelling.  Gastrointestinal: Negative for abdominal pain, blood in stool, constipation, diarrhea, heartburn, melena, nausea and vomiting.  Genitourinary: Negative for dysuria, frequency and urgency.  Musculoskeletal: Negative for back pain and joint pain.  Skin: Negative.  Negative  for itching and rash.  Neurological: Negative for dizziness, tingling, focal weakness, weakness and headaches.  Endo/Heme/Allergies: Does not bruise/bleed easily.  Psychiatric/Behavioral: Negative for depression. The patient is not nervous/anxious and does not have insomnia.       PAST MEDICAL HISTORY :  Past Medical History:  Diagnosis Date  . Diabetes mellitus without complication ([facility] [place]  . GERD (gastroesophageal reflux disease)   . Hyperlipidemia   . Hypertension   . Multiple myeloma ([facility]CDak[facility] [place]  . Obesity   . Thyroid cancer ([facility]  . Thyroid disease     PAST SURGICAL HISTORY :   Past Surgical History:  Procedure Laterality Date  . BONE MARROW BIOPSY  2014  . CATARACT EXTRACTION BILATERAL W/ ANTERIOR VITRECTOMY      FAMILY HISTORY :   Family History  Problem Relation Age of Onset  . Cancer Father 6388. Diabetes Mother     SOCIAL HISTORY:   Social History   Tobacco Use  . Smoking status: Never Smoker  . Smokeless tobacco: Never Used  Substance Use Topics  . Alcohol use: No  . Drug use: No    ALLERGIES:  has No Known Allergies.  MEDICATIONS:  Current Outpatient Medications  Medication Sig Dispense Refill  . acyclovir (ZOVIRAX) 400 MG tablet 400 mg 2 (two) times daily.    . Marland KitchenmLODipine (NORVASC) 10 MG tablet Take 10 mg by mouth daily.    . Marland Kitchenspirin EC 81 MG tablet Take 81 mg by mouth daily.    . Marland Kitchentorvastatin (LIPITOR) 20 MG tablet Take 20 mg by mouth daily.    . cloNIDine (CATAPRES) 0.1 MG tablet Take 0.1 mg  by mouth 2 (two) times daily.    . Cyanocobalamin (RA VITAMIN B-12 TR) 1000 MCG TBCR Take by mouth.    . levothyroxine (SYNTHROID) 137 MCG tablet Take 1 tablet by mouth daily.     [name] losartan (COZAAR) 100 MG tablet Take 100 mg by mouth daily.    [name] DELICA LANCETS 37C MISC Inject 1 Lancet as directed once daily.    . pioglitazone (ACTOS) 30 MG tablet Take 30 mg by mouth daily.    . potassium chloride SA (K-DUR,KLOR-CON) 20 MEQ tablet Take 20 mEq by mouth  2 (two) times daily.   0  . quinapril (ACCUPRIL) 40 MG tablet Take 40 mg by mouth daily.    . sennosides-docusate sodium (SENOKOT-S) 8.6-50 MG tablet Take 2 tablets by mouth daily.    [name] lenalidomide (REVLIMID) 15 MG capsule 1 capsule Daily for 3 weeks and then 1 week off 21 capsule 0   No current facility-administered medications for this visit.    Facility-Administered Medications Ordered in Other Visits  Medication Dose Route Frequency Provider Last Rate Last Dose  . 0.9 %  sodium chloride infusion   Intravenous Continuous Alan Sickle, MD   Stopped at 12/11/14 1528    PHYSICAL EXAMINATION: ECOG PERFORMANCE STATUS: 0 - Asymptomatic  BP 129/77   Pulse 63   Temp (!) 96.9 F (36.1 C) (Tympanic)   Resp 20   Ht '5\' 10"'  (1.778 m)   Wt 242 lb (109.8 kg)   BMI 34.72 kg/m   Filed Weights   10/03/17 1325  Weight: 242 lb (109.8 kg)    Physical Exam  Constitutional: He is oriented to person, place, and time and well-developed, well-nourished, and in no distress.  HENT:  Head: Normocephalic and atraumatic.  Mouth/Throat: Oropharynx is clear and moist. No oropharyngeal exudate.  Eyes: Pupils are equal, round, and reactive to light.  Neck: Normal range of motion. Neck supple.  Cardiovascular: Normal rate and regular rhythm.  Pulmonary/Chest: No respiratory distress. He has no wheezes.  Abdominal: Soft. Bowel sounds are normal. He exhibits no distension and no mass. There is no tenderness. There is no rebound and no guarding.  Musculoskeletal: Normal range of motion. He exhibits no edema or tenderness.  Neurological: He is alert and oriented to person, place, and time.  Skin: Skin is warm.  Psychiatric: Affect normal.      LABORATORY DATA:  I have reviewed the data as listed    Component Value Date/Time   NA 139 10/03/2017 1259   NA 139 05/01/2014 1322   K 4.2 10/03/2017 1259   K 4.4 05/01/2014 1322   CL 109 10/03/2017 1259   CL 107 05/01/2014 1322   CO2 25  10/03/2017 1259   CO2 27 05/01/2014 1322   GLUCOSE 115 (H) 10/03/2017 1259   GLUCOSE 102 (H) 05/01/2014 1322   BUN 15 10/03/2017 1259   BUN 16 05/01/2014 1322   CREATININE 1.33 (H) 10/03/2017 1259   CREATININE 1.48 (H) 05/29/2014 0949   CALCIUM 8.7 (L) 10/03/2017 1259   CALCIUM 9.1 05/01/2014 1322   PROT 7.1 10/03/2017 1259   PROT 7.5 05/01/2014 1322   ALBUMIN 3.5 10/03/2017 1259   ALBUMIN 3.6 05/01/2014 1322   AST 15 10/03/2017 1259   AST 17 05/01/2014 1322   ALT 10 10/03/2017 1259   ALT 13 (L) 05/01/2014 1322   ALKPHOS 44 10/03/2017 1259   ALKPHOS 47 05/01/2014 1322   BILITOT 0.6 10/03/2017 1259   BILITOT 0.6 05/01/2014 1322  GFRNONAA 51 (L) 10/03/2017 1259   GFRNONAA 47 (L) 05/29/2014 0949   GFRAA 60 (L) 10/03/2017 1259   GFRAA 55 (L) 05/29/2014 0949    No results found for: SPEP, UPEP  Lab Results  Component Value Date   WBC 3.1 (L) 10/03/2017   NEUTROABS 1.6 10/03/2017   HGB 12.3 (L) 10/03/2017   HCT 37.0 (L) 10/03/2017   MCV 98.1 10/03/2017   PLT 174 10/03/2017      Chemistry      Component Value Date/Time   NA 139 10/03/2017 1259   NA 139 05/01/2014 1322   K 4.2 10/03/2017 1259   K 4.4 05/01/2014 1322   CL 109 10/03/2017 1259   CL 107 05/01/2014 1322   CO2 25 10/03/2017 1259   CO2 27 05/01/2014 1322   BUN 15 10/03/2017 1259   BUN 16 05/01/2014 1322   CREATININE 1.33 (H) 10/03/2017 1259   CREATININE 1.48 (H) 05/29/2014 0949      Component Value Date/Time   CALCIUM 8.7 (L) 10/03/2017 1259   CALCIUM 9.1 05/01/2014 1322   ALKPHOS 44 10/03/2017 1259   ALKPHOS 47 05/01/2014 1322   AST 15 10/03/2017 1259   AST 17 05/01/2014 1322   ALT 10 10/03/2017 1259   ALT 13 (L) 05/01/2014 1322   BILITOT 0.6 10/03/2017 1259   BILITOT 0.6 05/01/2014 1322       RADIOGRAPHIC STUDIES: I have personally reviewed the radiological images as listed and agreed with the findings in the report. No results found.   ASSESSMENT & PLAN:  Multiple myeloma in remission  ([place]) #Multiple myeloma-on maintenance Revlimid 15 mg 3 weeks/1 week off.  STABLE.   #No clinical evidence of progression; myeloma panel May 2019- Negative M protein/slightly abnormal K/L ration ~2.0  Labs from today pending.  Continue Revlimid.new script given.   #Chronic kidney disease creatinine 1.4-1.5;STABLE.   #Bone modifying agent/multiple myeloma-continue Zometa; no side effects noted.  Calcium 8.9. STABLE.  Continue vitamin D to his calcium supplementation.   #  We'll follow up with me in 3 months/CBC CMP/MM panel; K/L light chains-few days prior/ Zometa. Zometa today; monthly cbc/bmp   Orders Placed This Encounter  Procedures  . CBC with Differential/Platelet    Standing Status:   Future    Standing Expiration Date:   10/04/2018  . Comprehensive metabolic panel    Standing Status:   Future    Standing Expiration Date:   10/04/2018  . Kappa/lambda light chains    Standing Status:   Future    Standing Expiration Date:   10/04/2018  . Multiple Myeloma Panel (SPEP&IFE w/QIG)    Standing Status:   Future    Standing Expiration Date:   10/04/2018   All questions were answered. The patient knows to call the clinic with any problems, questions or concerns.      Alan Sickle, MD 10/03/2017 2:13 PM

## 2017-10-03 NOTE — Assessment & Plan Note (Addendum)
#  Multiple myeloma-on maintenance Revlimid 15 mg 3 weeks/1 week off.  STABLE.   #No clinical evidence of progression; myeloma panel May 2019- Negative M protein/slightly abnormal K/L ration ~2.0  Labs from today pending.  Continue Revlimid.new script given.   #Chronic kidney disease creatinine 1.4-1.5;STABLE.   #Bone modifying agent/multiple myeloma-continue Zometa; no side effects noted.  Calcium 8.9. STABLE.  Continue vitamin D to his calcium supplementation.   #  We'll follow up with me in 3 months/CBC CMP/MM panel; K/L light chains-few days prior/ Zometa. Zometa today; monthly cbc/bmp

## 2017-10-03 NOTE — Progress Notes (Signed)
Patient here for f/u for myeloma. He is currently on his week break from revlimid. [facility] survey performed for revlimid renewal. Rems # [id number]. Pt would like to have this script sent to [facility] today and then he will have it ready for next week to start back.

## 2017-10-04 LAB — KAPPA/LAMBDA LIGHT CHAINS
KAPPA FREE LGHT CHN: 83.8 mg/L — AB (ref 3.3–19.4)
Kappa, lambda light chain ratio: 1.66 — ABNORMAL HIGH (ref 0.26–1.65)
LAMDA FREE LIGHT CHAINS: 50.4 mg/L — AB (ref 5.7–26.3)

## 2017-10-04 LAB — MULTIPLE MYELOMA PANEL, SERUM
ALBUMIN/GLOB SERPL: 1.1 (ref 0.7–1.7)
ALPHA 1: 0.2 g/dL (ref 0.0–0.4)
ALPHA2 GLOB SERPL ELPH-MCNC: 0.6 g/dL (ref 0.4–1.0)
Albumin SerPl Elph-Mcnc: 3.3 g/dL (ref 2.9–4.4)
B-Globulin SerPl Elph-Mcnc: 1 g/dL (ref 0.7–1.3)
GAMMA GLOB SERPL ELPH-MCNC: 1.5 g/dL (ref 0.4–1.8)
GLOBULIN, TOTAL: 3.3 g/dL (ref 2.2–3.9)
IGA: 522 mg/dL — AB (ref 61–437)
IgG (Immunoglobin G), Serum: 1591 mg/dL (ref 700–1600)
IgM (Immunoglobulin M), Srm: 39 mg/dL (ref 15–143)
Total Protein ELP: 6.6 g/dL (ref 6.0–8.5)

## 2017-10-31 ENCOUNTER — Inpatient Hospital Stay: Payer: Medicare HMO | Attending: Internal Medicine

## 2017-10-31 DIAGNOSIS — C9001 Multiple myeloma in remission: Secondary | ICD-10-CM | POA: Insufficient documentation

## 2017-10-31 LAB — CBC WITH DIFFERENTIAL/PLATELET
BASOS PCT: 1 %
Basophils Absolute: 0 10*3/uL (ref 0–0.1)
Eosinophils Absolute: 0.3 10*3/uL (ref 0–0.7)
Eosinophils Relative: 9 %
HCT: 35.9 % — ABNORMAL LOW (ref 40.0–52.0)
Hemoglobin: 12.1 g/dL — ABNORMAL LOW (ref 13.0–18.0)
LYMPHS PCT: 26 %
Lymphs Abs: 0.9 10*3/uL — ABNORMAL LOW (ref 1.0–3.6)
MCH: 32.8 pg (ref 26.0–34.0)
MCHC: 33.7 g/dL (ref 32.0–36.0)
MCV: 97.3 fL (ref 80.0–100.0)
Monocytes Absolute: 0.6 10*3/uL (ref 0.2–1.0)
Monocytes Relative: 18 %
NEUTROS ABS: 1.5 10*3/uL (ref 1.4–6.5)
Neutrophils Relative %: 46 %
Platelets: 165 10*3/uL (ref 150–440)
RBC: 3.69 MIL/uL — AB (ref 4.40–5.90)
RDW: 16.2 % — ABNORMAL HIGH (ref 11.5–14.5)
WBC: 3.3 10*3/uL — AB (ref 3.8–10.6)

## 2017-10-31 LAB — COMPREHENSIVE METABOLIC PANEL
ALBUMIN: 3.6 g/dL (ref 3.5–5.0)
ALT: 11 U/L (ref 0–44)
ANION GAP: 6 (ref 5–15)
AST: 16 U/L (ref 15–41)
Alkaline Phosphatase: 45 U/L (ref 38–126)
BUN: 17 mg/dL (ref 8–23)
CALCIUM: 8.7 mg/dL — AB (ref 8.9–10.3)
CO2: 26 mmol/L (ref 22–32)
Chloride: 110 mmol/L (ref 98–111)
Creatinine, Ser: 1.42 mg/dL — ABNORMAL HIGH (ref 0.61–1.24)
GFR calc non Af Amer: 47 mL/min — ABNORMAL LOW (ref >60)
GFR, EST AFRICAN AMERICAN: 55 mL/min — AB (ref >60)
GLUCOSE: 100 mg/dL — AB (ref 70–99)
Potassium: 4.2 mmol/L (ref 3.5–5.1)
SODIUM: 142 mmol/L (ref 135–145)
TOTAL PROTEIN: 7.3 g/dL (ref 6.5–8.1)
Total Bilirubin: 0.6 mg/dL (ref 0.3–1.2)

## 2017-11-02 ENCOUNTER — Other Ambulatory Visit: Payer: Self-pay | Admitting: *Deleted

## 2017-11-02 DIAGNOSIS — C9001 Multiple myeloma in remission: Secondary | ICD-10-CM

## 2017-11-05 MED ORDER — LENALIDOMIDE 15 MG PO CAPS: ORAL_CAPSULE | ORAL | 0 refills | Status: DC

## 2017-11-15 ENCOUNTER — Emergency Department
Admission: EM | Admit: 2017-11-15 | Discharge: 2017-11-15 | Disposition: A | Discharge status: Home / Self Care | Payer: Medicare HMO | Attending: Emergency Medicine | Admitting: Emergency Medicine

## 2017-11-15 ENCOUNTER — Other Ambulatory Visit: Payer: Self-pay

## 2017-11-15 ENCOUNTER — Emergency Department: Payer: Medicare HMO

## 2017-11-15 DIAGNOSIS — E039 Hypothyroidism, unspecified: Secondary | ICD-10-CM | POA: Insufficient documentation

## 2017-11-15 DIAGNOSIS — L03115 Cellulitis of right lower limb: Secondary | ICD-10-CM | POA: Diagnosis not present

## 2017-11-15 DIAGNOSIS — R2241 Localized swelling, mass and lump, right lower limb: Secondary | ICD-10-CM | POA: Diagnosis present

## 2017-11-15 DIAGNOSIS — E119 Type 2 diabetes mellitus without complications: Secondary | ICD-10-CM | POA: Diagnosis not present

## 2017-11-15 DIAGNOSIS — I10 Essential (primary) hypertension: Secondary | ICD-10-CM | POA: Insufficient documentation

## 2017-11-15 LAB — CBC WITH DIFFERENTIAL/PLATELET
Abs Immature Granulocytes: 0.02 10*3/uL (ref 0.00–0.07)
Basophils Absolute: 0 10*3/uL (ref 0.0–0.1)
Basophils Relative: 1 %
EOS ABS: 0.1 10*3/uL (ref 0.0–0.5)
Eosinophils Relative: 3 %
HEMATOCRIT: 38.7 % — AB (ref 39.0–52.0)
Hemoglobin: 13 g/dL (ref 13.0–17.0)
Immature Granulocytes: 1 %
LYMPHS ABS: 0.5 10*3/uL — AB (ref 0.7–4.0)
Lymphocytes Relative: 12 %
MCH: 32.3 pg (ref 26.0–34.0)
MCHC: 33.6 g/dL (ref 30.0–36.0)
MCV: 96.3 fL (ref 80.0–100.0)
MONO ABS: 0.6 10*3/uL (ref 0.1–1.0)
MONOS PCT: 14 %
Neutro Abs: 3.1 10*3/uL (ref 1.7–7.7)
Neutrophils Relative %: 69 %
Platelets: 165 10*3/uL (ref 150–400)
RBC: 4.02 MIL/uL — ABNORMAL LOW (ref 4.22–5.81)
RDW: 14.6 % (ref 11.5–15.5)
WBC: 4.4 10*3/uL (ref 4.0–10.5)
nRBC: 0 % (ref 0.0–0.2)

## 2017-11-15 LAB — COMPREHENSIVE METABOLIC PANEL
ALT: 15 U/L (ref 0–44)
ANION GAP: 9 (ref 5–15)
AST: 24 U/L (ref 15–41)
Albumin: 3.3 g/dL — ABNORMAL LOW (ref 3.5–5.0)
Alkaline Phosphatase: 42 U/L (ref 38–126)
BILIRUBIN TOTAL: 0.8 mg/dL (ref 0.3–1.2)
BUN: 20 mg/dL (ref 8–23)
CO2: 23 mmol/L (ref 22–32)
Calcium: 9 mg/dL (ref 8.9–10.3)
Chloride: 109 mmol/L (ref 98–111)
Creatinine, Ser: 1.49 mg/dL — ABNORMAL HIGH (ref 0.61–1.24)
GFR, EST AFRICAN AMERICAN: 52 mL/min — AB (ref >60)
GFR, EST NON AFRICAN AMERICAN: 45 mL/min — AB (ref >60)
Glucose, Bld: 130 mg/dL — ABNORMAL HIGH (ref 70–99)
POTASSIUM: 4.3 mmol/L (ref 3.5–5.1)
Sodium: 141 mmol/L (ref 135–145)
TOTAL PROTEIN: 8 g/dL (ref 6.5–8.1)

## 2017-11-15 MED ORDER — CLINDAMYCIN HCL 300 MG PO CAPS: 300.0000 mg | ORAL_CAPSULE | Freq: Three times a day (TID) | ORAL | 0 refills | Status: DC

## 2017-11-15 MED ORDER — OXYCODONE-ACETAMINOPHEN 5-325 MG PO TABS: 1.0000 | ORAL_TABLET | Freq: Two times a day (BID) | ORAL | 0 refills | Status: DC | PRN

## 2017-11-15 MED ORDER — VANCOMYCIN HCL IN DEXTROSE 1-5 GM/200ML-% IV SOLN
1000.0000 mg | Freq: Once | INTRAVENOUS | Status: AC
  Administered 2017-11-15: 1000 mg via INTRAVENOUS
  Filled 2017-11-15: qty 200

## 2017-11-15 NOTE — ED Provider Notes (Signed)
[facility] Emergency Department Provider Note       Time seen: ----------------------------------------- 8:58 AM on 11/15/2017 -----------------------------------------   I have reviewed the triage vital signs and the nursing notes.  HISTORY   Chief Complaint Cellulitis    HPI Alan Alexander is a 72 y.o. male with a history of diabetes, GERD, hyperlipidemia, hypertension, multiple myeloma, obesity, thyroid disease who presents to the ED for leg swelling and pain.  Patient denies fevers, chills, vomiting or diarrhea.  He does report he has multiple myeloma and takes Revlimid.  He states on Monday there were 2 small dots on the right lower extremity but this is worsened to most of the right lower extremity below the knee anteriorly.  It is painful especially with walking.  Past Medical History:  Diagnosis Date  . Diabetes mellitus without complication ([place])   . GERD (gastroesophageal reflux disease)   . Hyperlipidemia   . Hypertension   . Multiple myeloma ([place])   . Obesity   . Thyroid cancer ([place])   . Thyroid disease     Patient Active Problem List   Diagnosis Date Noted  . Multiple myeloma in remission ([place]) 10/01/2015  . Type 2 diabetes mellitus ([place]) 09/20/2014  . HLD (hyperlipidemia) 06/10/2014  . Impaired renal function 12/05/2013  . Obstructive apnea 12/05/2013  . Adult hypothyroidism 12/05/2013  . BP (high blood pressure) 12/05/2013    Past Surgical History:  Procedure Laterality Date  . BONE MARROW BIOPSY  2014  . CATARACT EXTRACTION BILATERAL W/ ANTERIOR VITRECTOMY      Allergies Patient has no known allergies.  Social History Social History   Tobacco Use  . Smoking status: Never Smoker  . Smokeless tobacco: Never Used  Substance Use Topics  . Alcohol use: No  . Drug use: No   Review of Systems Constitutional: Negative for fever. Cardiovascular: Negative for chest pain. Respiratory: Negative for shortness of  breath. Gastrointestinal: Negative for abdominal pain, vomiting and diarrhea. Musculoskeletal: Positive for right leg pain Skin: Positive for right leg erythema Neurological: Negative for headaches, focal weakness or numbness.  All systems negative/normal/unremarkable except as stated in the HPI  ____________________________________________   PHYSICAL EXAM:  VITAL SIGNS: ED Triage Vitals  Enc Vitals Group     BP 11/15/17 0854 134/65     Pulse Rate 11/15/17 0854 61     Resp 11/15/17 0854 16     Temp 11/15/17 0854 98 F (36.7 C)     Temp Source 11/15/17 0854 Oral     SpO2 11/15/17 0854 98 %     Weight 11/15/17 0855 242 lb 1 oz (109.8 kg)     Height --      Head Circumference --      Peak Flow --      Pain Score 11/15/17 0855 8     Pain Loc --      Pain Edu? --      Excl. in [place]? --    Constitutional: Alert and oriented. Well appearing and in no distress. ENT   Head: Normocephalic and atraumatic.   Nose: No congestion/rhinnorhea.   Mouth/Throat: Mucous membranes are moist.   Neck: No stridor. Cardiovascular: Normal rate, regular rhythm. No murmurs, rubs, or gallops. Respiratory: Normal respiratory effort without tachypnea nor retractions. Breath sounds are clear and equal bilaterally. No wheezes/rales/rhonchi. Gastrointestinal: Soft and nontender. Normal bowel sounds Musculoskeletal: Right lower extremity tenderness below the knee, there is erythema and scattered areas with some areas of dermatitis and/or vasculitis  Neurologic:  Normal speech and language. No gross focal neurologic deficits are appreciated.  Skin: Scattered areas of erythema, red raised skin with tenderness throughout the right lower extremity below the knee, mostly anteriorly Psychiatric: Mood and affect are normal. Speech and behavior are normal.   ____________________________________________  ED COURSE:  As part of my medical decision making, I reviewed the following data within the  [place] History obtained from family if available, nursing notes, old chart and ekg, as well as notes from prior ED visits. Patient presented for right lower extremity redness, swelling and pain, we will assess with labs and imaging as indicated at this time.   Procedures ____________________________________________   LABS (pertinent positives/negatives)  Labs Reviewed  CBC WITH DIFFERENTIAL/PLATELET - Abnormal; Notable for the following components:      Result Value   RBC 4.02 (*)    HCT 38.7 (*)    Lymphs Abs 0.5 (*)    All other components within normal limits  COMPREHENSIVE METABOLIC PANEL - Abnormal; Notable for the following components:   Glucose, Bld 130 (*)    Creatinine, Ser 1.49 (*)    Albumin 3.3 (*)    GFR calc non Af Amer 45 (*)    GFR calc Af Amer 52 (*)    All other components within normal limits  CULTURE, BLOOD (ROUTINE X 2)  CULTURE, BLOOD (ROUTINE X 2)    RADIOLOGY Images were viewed by me  Lower extremity ultrasound Is negative for DVT ____________________________________________  DIFFERENTIAL DIAGNOSIS   Cellulitis, DVT, vasculitis, dermatitis, coagulopathy  FINAL ASSESSMENT AND PLAN  Cellulitis   Plan: The patient had presented for right lower extremity erythema, swelling and pain. Patient's labs are reassuring. Patient's imaging was negative for DVT.  Most of the right lower extremity erythema appears to be cellulitis, there are some areas which are concerning for vasculitis.  I do want him to follow-up closely with his oncologist as soon as possible for recheck.  Of advised if he is not improved in 2 days he may require admission.  He is advised to return if he has fever or chills.   Laurence Aly, MD   Note: This note was generated in part or whole with voice recognition software. Voice recognition is usually quite accurate but there are transcription errors that can and very often do occur. I apologize for any  typographical errors that were not detected and corrected.     Earleen Newport, MD 11/15/17 1059

## 2017-11-15 NOTE — ED Triage Notes (Addendum)
Pt came to Ed via pov c/o leg swelling and pain. Rash and swelling to right leg from ankle to below the knee. Reports started Monday with 2 small dots and has since worsened. Denies, fall or injury. Afebrile in triage

## 2017-11-20 LAB — CULTURE, BLOOD (ROUTINE X 2)
CULTURE: NO GROWTH
CULTURE: NO GROWTH
SPECIAL REQUESTS: ADEQUATE

## 2017-11-21 ENCOUNTER — Telehealth: Payer: Self-pay | Admitting: *Deleted

## 2017-11-21 NOTE — Telephone Encounter (Signed)
t reports that he went to ER Thursday and was diagnosed with cellulitis and was given Oxycodone APAP and Clindamycin

## 2017-11-22 ENCOUNTER — Inpatient Hospital Stay: Payer: Medicare HMO | Attending: Oncology | Admitting: Oncology

## 2017-11-22 ENCOUNTER — Encounter: Payer: Self-pay | Admitting: Oncology

## 2017-11-22 VITALS — BP 109/67 | HR 72 | Temp 97.5°F | Resp 18

## 2017-11-22 DIAGNOSIS — E1122 Type 2 diabetes mellitus with diabetic chronic kidney disease: Secondary | ICD-10-CM

## 2017-11-22 DIAGNOSIS — K219 Gastro-esophageal reflux disease without esophagitis: Secondary | ICD-10-CM | POA: Diagnosis not present

## 2017-11-22 DIAGNOSIS — C9001 Multiple myeloma in remission: Secondary | ICD-10-CM | POA: Diagnosis present

## 2017-11-22 DIAGNOSIS — N189 Chronic kidney disease, unspecified: Secondary | ICD-10-CM | POA: Diagnosis not present

## 2017-11-22 DIAGNOSIS — M6281 Muscle weakness (generalized): Secondary | ICD-10-CM

## 2017-11-22 DIAGNOSIS — Z189 Retained foreign body fragments, unspecified material: Secondary | ICD-10-CM

## 2017-11-22 DIAGNOSIS — R066 Hiccough: Secondary | ICD-10-CM | POA: Diagnosis not present

## 2017-11-22 DIAGNOSIS — L03116 Cellulitis of left lower limb: Secondary | ICD-10-CM | POA: Insufficient documentation

## 2017-11-22 DIAGNOSIS — Z79899 Other long term (current) drug therapy: Secondary | ICD-10-CM | POA: Diagnosis not present

## 2017-11-22 DIAGNOSIS — R5383 Other fatigue: Secondary | ICD-10-CM

## 2017-11-22 MED ORDER — PANTOPRAZOLE SODIUM 40 MG PO TBEC: 40.0000 mg | DELAYED_RELEASE_TABLET | Freq: Every day | ORAL | 0 refills | Status: DC

## 2017-11-22 MED ORDER — OMEPRAZOLE 20 MG PO CPDR: 20.0000 mg | DELAYED_RELEASE_CAPSULE | Freq: Every day | ORAL | 0 refills | Status: DC

## 2017-11-22 MED ORDER — BACLOFEN 10 MG PO TABS: 10.0000 mg | ORAL_TABLET | Freq: Three times a day (TID) | ORAL | 0 refills | Status: DC

## 2017-11-22 MED ORDER — BACLOFEN 10 MG PO TABS: 5.0000 mg | ORAL_TABLET | Freq: Three times a day (TID) | ORAL | 0 refills | Status: DC

## 2017-11-22 NOTE — Progress Notes (Addendum)
Symptom Management Consult note [facility]  [phone] [phone]  Patient Care Team: Baxter Hire, MD as PCP - General (Internal Medicine) Druscilla Brownie, MD as Consulting Physician (Dermatology)   Name of the patient: Alan Alexander  [id number]  1945/12/31   Date of visit: 11/22/2017  Diagnosis: Multiple Myeloma  Chief Complaint: Follow-up Cellulitis and Hiccups  Current Treatment: Revlimid 3 weeks on, 1 week off  Oncology History: Patient was last seen by Dr. Rogue Bussing primary oncologist on 10/03/2017 for follow-up for treatment of multiple myeloma.  On maintenance Revlimid.  At that visit he denied any fevers, nausea or vomiting, swelling in legs or worsening back pain.  He appears to be doing well on Revlimid without any side effects.  There is no medical evidence of progression.  Kidney function remained unchanged.  He was instructed to continue Zometa q 3 months and to continue vitamin D and calcium supplementation.  In the interim, he was evaluated in the emergency room (11/15/17) for cellulitis/vasculitis of right leg.  Imaging of right lower extremity was negative for DVT.  He was started on clindamycin for 10 days and Percocet PRN for pain.  He was encouraged to follow-up with oncology in a few days to further assess.  Approximately 3 to 4 days after initiating antibiotics and narcotics, he developed intractable hiccups and GERD-like symptoms.   Oncology History    # April 2014- MULTIPLE MYELOMA [IgG- 2.2gm/dl; 40-50% plasma cell; Cyto-N; FISH- gain of chr.9, 15 & ccnd1/11q13] s/p RVD x6 [sep 2014; BMBx- ~5% plasma cells]; Maint Rev-DEX-Zometa; March 2015-Stopped Dex Cont- Rev-Zometa; July 2016- Rev 26m; NOV 4th  2016-HOLD; [facility] declined BMT.   # Re-START FEB 2017 Rev 113m3 W- on & 1 w OFF.   # Zometa q 54M  # CKD [creat ~1.4; sec MM];  DM  -------------------------------------------------------------------------   DIAGNOSIS: [2[name] MULTIPLE MYELOMA  GOALS: control  CURRENT/MOST RECENT THERAPY- REVLIMID Maintenance [Feb 2017]      Multiple myeloma in remission ([facility]  10/01/2015 Initial Diagnosis    Multiple myeloma in remission ([facility]    Subjective Data: Subjective:     [name] an [age].o. male who presents for evaluation of heartburn and hiccups. This has been associated with cough, heartburn and hiccups. He denies abdominal bloating, chest pain, choking on food, difficulty swallowing, dysphagia, melena, nausea, need to clear throat frequently, shortness of breath, upper abdominal discomfort and wheezing. Symptoms have been present for 4 days. He denies dysphagia. He has not lost weight. He denies melena, hematochezia, hematemesis, and coffee ground emesis. Medical therapy in the past has included: none.  The following portions of the patient's history were reviewed and updated as appropriate: allergies, current medications, past family history, past medical history, past social history, past surgical history and problem list.  Review of Systems A comprehensive review of systems was negative except for: Constitutional: positive for fatigue Gastrointestinal: positive for reflux symptoms and hiccups Integument/breast: positive for rash and cellulitis right leg; on antibioitcs Musculoskeletal: positive for muscle weakness    Objective:     BP 109/67 (BP Location: Right Arm, Patient Position: Sitting)   Pulse 72   Temp (!) 97.5 F (36.4 C) (Tympanic)   Resp 18  General appearance: alert, fatigued, no distress and moderately obese Lungs: clear to auscultation bilaterally Heart: regular rate and rhythm, S1, S2 normal, no murmur, click, rub or gallop Abdomen: soft, non-tender; bowel sounds normal; no masses,  no organomegaly Extremities: edema right leg Skin: erythema - lower leg(s) right and varicosities  - lower leg(s) right   Assessment:    Gastroesophageal Reflux Disease/hiccups unclear etiology. Possibly related to recently added medications (Percocet and clindamycin) and/or morbid obesity. On several medications that can cause GI upset including most recently prescribed clindamycin which often causes diarrhea.  Less common side effects include nausea, vomiting, flatulence, metallic taste, anorexia and esophagitis.  Has had 7/10 days worth of clindamycin.  Percocet has a less than 1% chance of causing hiccups.   Plan:    Nonpharmacologic treatments were discussed including: eating smaller meals, elevation of the head of bed at night, avoidance of caffeine, chocolate, nicotine and peppermint, and avoiding tight fitting clothing. Will start a trial of proton pump inhibitors.    Multiple myeloma: Currently on Revlimid.  Unclear of where he is in his cycle.  States he is currently taking his Revlimid and thinks he will be finishing at the end of this week.  He is scheduled to return to clinic on 11/28/2017 for repeat labs and then on 12/26/2017 for labs, MD assessment and Zometa.   Hiccups/Gerd: Symptoms began approximately 4 days ago.  New medications include Percocet and clindamycin as prescribed for cellulitis of right leg. Clindamycin can cause or worsen GERD symptoms/esophagitis. Given he has already taken 7 days worth of clindamycin, do not recommend stopping at this time given significant improvement in cellulitis.  He will take a total of 10 days completing in 2-3 days.  Patient's wife also admits to coughing at night and sleeping on 2 pillows.  Will trial Protonix 40 mg daily to see if this helps with symptoms. Up to date recommends RX baclofen 3 times daily for hiccups as needed. Sent to pharmacy. Given kidney dysfunction, up to date recommends dosage adjustment of Baclofen to 5 mg TID.  Checked interaction with Revlimid and all recently added medications.  There does not appear to be any  interactions.   Educated patient on physical maneuvers intentionally to help with hiccups including breath holding, Valsalva maneuver, sipping [place], pulling on tongue, gargling water, swelling a teaspoon of dry sugar, pulling knees to chest or leaning forward to compress the chest.   Cellulitis of right leg: This is improving.  States edema and pain have improved dramatically since he was seen in the emergency room. Continue taking clindamycin for a total of 10 days.  Percocet as needed for pain.  Instructed patient if he develops any fever or worsening of symptoms to call clinic immediately.  Greater than 50% was spent in counseling and coordination of care with this patient including but not limited to discussion of the relevant topics above (See A&P) including, but not limited to diagnosis and management of acute and chronic medical conditions.   CC: Dr. Vertell Novak, NP 11/22/2017 11:16 AM

## 2017-11-28 ENCOUNTER — Other Ambulatory Visit: Payer: Self-pay

## 2017-11-28 ENCOUNTER — Inpatient Hospital Stay: Payer: Medicare HMO

## 2017-11-28 DIAGNOSIS — C9001 Multiple myeloma in remission: Secondary | ICD-10-CM | POA: Diagnosis not present

## 2017-11-28 LAB — CBC WITH DIFFERENTIAL/PLATELET
ABS IMMATURE GRANULOCYTES: 0.04 10*3/uL (ref 0.00–0.07)
BASOS ABS: 0 10*3/uL (ref 0.0–0.1)
Basophils Relative: 0 %
Eosinophils Absolute: 0.3 10*3/uL (ref 0.0–0.5)
Eosinophils Relative: 6 %
HCT: 35.4 % — ABNORMAL LOW (ref 39.0–52.0)
Hemoglobin: 11.2 g/dL — ABNORMAL LOW (ref 13.0–17.0)
IMMATURE GRANULOCYTES: 1 %
Lymphocytes Relative: 19 %
Lymphs Abs: 0.8 10*3/uL (ref 0.7–4.0)
MCH: 30.7 pg (ref 26.0–34.0)
MCHC: 31.6 g/dL (ref 30.0–36.0)
MCV: 97 fL (ref 80.0–100.0)
Monocytes Absolute: 0.4 10*3/uL (ref 0.1–1.0)
Monocytes Relative: 10 %
NEUTROS ABS: 2.7 10*3/uL (ref 1.7–7.7)
NEUTROS PCT: 64 %
NRBC: 0 % (ref 0.0–0.2)
Platelets: 183 10*3/uL (ref 150–400)
RBC: 3.65 MIL/uL — AB (ref 4.22–5.81)
RDW: 14.6 % (ref 11.5–15.5)
WBC: 4.2 10*3/uL (ref 4.0–10.5)

## 2017-11-28 LAB — COMPREHENSIVE METABOLIC PANEL
ALBUMIN: 3.2 g/dL — AB (ref 3.5–5.0)
ALT: 14 U/L (ref 0–44)
ANION GAP: 4 — AB (ref 5–15)
AST: 18 U/L (ref 15–41)
Alkaline Phosphatase: 42 U/L (ref 38–126)
BILIRUBIN TOTAL: 0.5 mg/dL (ref 0.3–1.2)
BUN: 16 mg/dL (ref 8–23)
CO2: 23 mmol/L (ref 22–32)
Calcium: 8.9 mg/dL (ref 8.9–10.3)
Chloride: 113 mmol/L — ABNORMAL HIGH (ref 98–111)
Creatinine, Ser: 1.95 mg/dL — ABNORMAL HIGH (ref 0.61–1.24)
GFR, EST AFRICAN AMERICAN: 38 mL/min — AB (ref >60)
GFR, EST NON AFRICAN AMERICAN: 32 mL/min — AB (ref >60)
GLUCOSE: 106 mg/dL — AB (ref 70–99)
POTASSIUM: 4.6 mmol/L (ref 3.5–5.1)
Sodium: 140 mmol/L (ref 135–145)
Total Protein: 7.5 g/dL (ref 6.5–8.1)

## 2017-11-30 ENCOUNTER — Other Ambulatory Visit: Payer: Self-pay | Admitting: *Deleted

## 2017-11-30 DIAGNOSIS — C9001 Multiple myeloma in remission: Secondary | ICD-10-CM

## 2017-11-30 MED ORDER — LENALIDOMIDE 15 MG PO CAPS: ORAL_CAPSULE | ORAL | 0 refills | Status: DC

## 2017-12-05 ENCOUNTER — Telehealth: Payer: Self-pay | Admitting: Internal Medicine

## 2017-12-05 DIAGNOSIS — C9001 Multiple myeloma in remission: Secondary | ICD-10-CM

## 2017-12-05 NOTE — Addendum Note (Signed)
Addended by: Sandria Bales B on: 12/05/2017 04:26 PM   Modules accepted: Orders

## 2017-12-05 NOTE — Telephone Encounter (Signed)
Advised patient and patient's wife of these results, and that patient will need to come in tomorrow for lab work.  Colette, please schedule lab appt for patient.  Thanks!

## 2017-12-05 NOTE — Telephone Encounter (Signed)
Alan Alexander-please inform patient that his creatinine is elevated at 1.9/above his baseline of 1.4.  Recommend getting a BMP tomorrow- 10/31.  Please schedule lab. Thanks

## 2017-12-06 ENCOUNTER — Inpatient Hospital Stay: Payer: Medicare HMO

## 2017-12-06 ENCOUNTER — Other Ambulatory Visit: Payer: Self-pay

## 2017-12-06 DIAGNOSIS — C9001 Multiple myeloma in remission: Secondary | ICD-10-CM | POA: Diagnosis not present

## 2017-12-06 LAB — BASIC METABOLIC PANEL
Anion gap: 4 — ABNORMAL LOW (ref 5–15)
BUN: 17 mg/dL (ref 8–23)
CHLORIDE: 113 mmol/L — AB (ref 98–111)
CO2: 24 mmol/L (ref 22–32)
CREATININE: 1.47 mg/dL — AB (ref 0.61–1.24)
Calcium: 8.3 mg/dL — ABNORMAL LOW (ref 8.9–10.3)
GFR calc Af Amer: 53 mL/min — ABNORMAL LOW (ref >60)
GFR calc non Af Amer: 46 mL/min — ABNORMAL LOW (ref >60)
Glucose, Bld: 151 mg/dL — ABNORMAL HIGH (ref 70–99)
Potassium: 4.5 mmol/L (ref 3.5–5.1)
Sodium: 141 mmol/L (ref 135–145)

## 2017-12-07 ENCOUNTER — Telehealth: Payer: Self-pay | Admitting: Internal Medicine

## 2017-12-07 NOTE — Telephone Encounter (Signed)
Please inform patient that his kidney numbers are back to baseline creatinine 1.4; recommend drinking fluids/hydration.  No new recommendations follow-up as planned.  Thank you

## 2017-12-10 NOTE — Telephone Encounter (Signed)
I left message for patient notifying him of Dr. Sharmaine Base recommendations.

## 2017-12-26 ENCOUNTER — Inpatient Hospital Stay (HOSPITAL_BASED_OUTPATIENT_CLINIC_OR_DEPARTMENT_OTHER): Payer: Medicare HMO | Admitting: Internal Medicine

## 2017-12-26 ENCOUNTER — Encounter: Payer: Self-pay | Admitting: Internal Medicine

## 2017-12-26 ENCOUNTER — Other Ambulatory Visit: Payer: Self-pay

## 2017-12-26 ENCOUNTER — Inpatient Hospital Stay: Payer: Medicare HMO

## 2017-12-26 ENCOUNTER — Inpatient Hospital Stay: Payer: Medicare HMO | Attending: Internal Medicine

## 2017-12-26 VITALS — BP 167/78 | HR 53 | Temp 97.5°F | Resp 20 | Ht 70.0 in | Wt 235.0 lb

## 2017-12-26 DIAGNOSIS — Z7982 Long term (current) use of aspirin: Secondary | ICD-10-CM | POA: Insufficient documentation

## 2017-12-26 DIAGNOSIS — Z79899 Other long term (current) drug therapy: Secondary | ICD-10-CM

## 2017-12-26 DIAGNOSIS — E079 Disorder of thyroid, unspecified: Secondary | ICD-10-CM | POA: Insufficient documentation

## 2017-12-26 DIAGNOSIS — R7989 Other specified abnormal findings of blood chemistry: Secondary | ICD-10-CM | POA: Diagnosis not present

## 2017-12-26 DIAGNOSIS — K219 Gastro-esophageal reflux disease without esophagitis: Secondary | ICD-10-CM | POA: Diagnosis not present

## 2017-12-26 DIAGNOSIS — N189 Chronic kidney disease, unspecified: Secondary | ICD-10-CM | POA: Insufficient documentation

## 2017-12-26 DIAGNOSIS — I129 Hypertensive chronic kidney disease with stage 1 through stage 4 chronic kidney disease, or unspecified chronic kidney disease: Secondary | ICD-10-CM

## 2017-12-26 DIAGNOSIS — E785 Hyperlipidemia, unspecified: Secondary | ICD-10-CM | POA: Diagnosis not present

## 2017-12-26 DIAGNOSIS — C9001 Multiple myeloma in remission: Secondary | ICD-10-CM

## 2017-12-26 DIAGNOSIS — E1122 Type 2 diabetes mellitus with diabetic chronic kidney disease: Secondary | ICD-10-CM

## 2017-12-26 LAB — COMPREHENSIVE METABOLIC PANEL
ALK PHOS: 40 U/L (ref 38–126)
ALT: 9 U/L (ref 0–44)
ANION GAP: 5 (ref 5–15)
AST: 14 U/L — ABNORMAL LOW (ref 15–41)
Albumin: 3.5 g/dL (ref 3.5–5.0)
BUN: 21 mg/dL (ref 8–23)
CALCIUM: 9 mg/dL (ref 8.9–10.3)
CO2: 24 mmol/L (ref 22–32)
Chloride: 108 mmol/L (ref 98–111)
Creatinine, Ser: 1.33 mg/dL — ABNORMAL HIGH (ref 0.61–1.24)
GFR calc Af Amer: 60 mL/min — ABNORMAL LOW (ref >60)
GFR calc non Af Amer: 51 mL/min — ABNORMAL LOW (ref >60)
GLUCOSE: 93 mg/dL (ref 70–99)
POTASSIUM: 4.2 mmol/L (ref 3.5–5.1)
Sodium: 137 mmol/L (ref 135–145)
TOTAL PROTEIN: 7.3 g/dL (ref 6.5–8.1)
Total Bilirubin: 0.6 mg/dL (ref 0.3–1.2)

## 2017-12-26 LAB — CBC WITH DIFFERENTIAL/PLATELET
Abs Immature Granulocytes: 0.04 10*3/uL (ref 0.00–0.07)
Basophils Absolute: 0 10*3/uL (ref 0.0–0.1)
Basophils Relative: 1 %
EOS ABS: 0.2 10*3/uL (ref 0.0–0.5)
Eosinophils Relative: 6 %
HCT: 34.9 % — ABNORMAL LOW (ref 39.0–52.0)
Hemoglobin: 11.2 g/dL — ABNORMAL LOW (ref 13.0–17.0)
IMMATURE GRANULOCYTES: 1 %
Lymphocytes Relative: 24 %
Lymphs Abs: 0.7 10*3/uL (ref 0.7–4.0)
MCH: 31.3 pg (ref 26.0–34.0)
MCHC: 32.1 g/dL (ref 30.0–36.0)
MCV: 97.5 fL (ref 80.0–100.0)
MONOS PCT: 13 %
Monocytes Absolute: 0.4 10*3/uL (ref 0.1–1.0)
NEUTROS PCT: 55 %
NRBC: 0 % (ref 0.0–0.2)
Neutro Abs: 1.7 10*3/uL (ref 1.7–7.7)
Platelets: 159 10*3/uL (ref 150–400)
RBC: 3.58 MIL/uL — AB (ref 4.22–5.81)
RDW: 15.9 % — AB (ref 11.5–15.5)
WBC: 3.1 10*3/uL — AB (ref 4.0–10.5)

## 2017-12-26 MED ORDER — SODIUM CHLORIDE 0.9 % IV SOLN
INTRAVENOUS | Status: DC
  Administered 2017-12-26: 15:00:00 via INTRAVENOUS
  Filled 2017-12-26: qty 250

## 2017-12-26 MED ORDER — ZOLEDRONIC ACID 4 MG/5ML IV CONC
3.0000 mg | Freq: Once | INTRAVENOUS | Status: AC
  Administered 2017-12-26: 3 mg via INTRAVENOUS
  Filled 2017-12-26: qty 3.75

## 2017-12-26 NOTE — Progress Notes (Signed)
[place] OFFICE PROGRESS NOTE  Patient Care Team: Baxter Hire, MD as PCP - General (Internal Medicine) Druscilla Brownie, MD as Consulting Physician (Dermatology)  Cancer Staging No matching staging information was found for the patient.   Oncology History    # April 2014- MULTIPLE MYELOMA [IgG- 2.2gm/dl; 40-50% plasma cell; Cyto-N; FISH- gain of chr.9, 15 & ccnd1/11q13] s/p RVD x6 [sep 2014; BMBx- ~5% plasma cells]; Maint Rev-DEX-Zometa; March 2015-Stopped Dex Cont- Rev-Zometa; July 2016- Rev 37m; NOV 4th  2016-HOLD; [facility] declined BMT.   # Re-START FEB 2017 Rev 123m3 W- on & 1 w OFF.   # Zometa q 70M  # CKD [creat ~1.4; sec MM]; DM  -------------------------------------------------------------------------   DIAGNOSIS: [2[name] MULTIPLE MYELOMA  GOALS: control  CURRENT/MOST RECENT THERAPY- REVLIMID Maintenance [Feb 2017]      Multiple myeloma in remission ([facility]  10/01/2015 Initial Diagnosis    Multiple [place] in remission ([facility]      INTERVAL HISTORY:  ThPrudencio Velazco89.o.  male pleasant patient above history of multiple myeloma currently on Revlimid maintenance is here for follow-up.  In the interim patient was evaluated in the emergency room for cellulitis of his right lower extremity.  Patient admits to spider bite.  Patient was treated with antibiotics.  Patient was also noted to have elevated creatinine up to 1.89; question second antibiotic versus others.  Currently no skin rash.  No nausea no vomiting or back pain.  Appetite is good.  No tingling or numbness.  Review of Systems  Constitutional: Negative for chills, diaphoresis, fever, malaise/fatigue and weight loss.  HENT: Negative for nosebleeds and sore throat.   Eyes: Negative for double vision.  Respiratory: Negative for cough, hemoptysis, sputum production, shortness of breath and wheezing.   Cardiovascular: Negative for chest pain, palpitations, orthopnea  and leg swelling.  Gastrointestinal: Negative for abdominal pain, blood in stool, constipation, diarrhea, heartburn, melena, nausea and vomiting.  Genitourinary: Negative for dysuria, frequency and urgency.  Musculoskeletal: Negative for back pain and joint pain.  Skin: Negative.  Negative for itching and rash.  Neurological: Negative for dizziness, tingling, focal weakness, weakness and headaches.  Endo/Heme/Allergies: Does not bruise/bleed easily.  Psychiatric/Behavioral: Negative for depression. The patient is not nervous/anxious and does not have insomnia.       PAST MEDICAL HISTORY :  Past Medical History:  Diagnosis Date  . Diabetes mellitus without complication ([place]  . GERD (gastroesophageal reflux disease)   . Hyperlipidemia   . Hypertension   . Multiple myeloma ([facility]  . Obesity   . Thyroid cancer ([facility] [place]  . Thyroid disease     PAST SURGICAL HISTORY :   Past Surgical History:  Procedure Laterality Date  . BONE MARROW BIOPSY  2014  . CATARACT EXTRACTION BILATERAL W/ ANTERIOR VITRECTOMY      FAMILY HISTORY :   Family History  Problem Relation Age of Onset  . Cancer Father 6370. Diabetes Mother     SOCIAL HISTORY:   Social History   Tobacco Use  . Smoking status: Never Smoker  . Smokeless tobacco: Never Used  Substance Use Topics  . Alcohol use: No  . Drug use: No    ALLERGIES:  has No Known Allergies.  MEDICATIONS:  Current Outpatient Medications  Medication Sig Dispense Refill  . acyclovir (ZOVIRAX) 400 MG tablet 400 mg 2 (two) times daily.    . Marland KitchenmLODipine (NORVASC) 10 MG tablet Take 10  mg by mouth daily.    [name] aspirin EC 81 MG tablet Take 81 mg by mouth daily.    [name] atorvastatin (LIPITOR) 20 MG tablet Take 20 mg by mouth daily.    . baclofen (LIORESAL) 10 MG tablet Take 0.5 tablets (5 mg total) by mouth 3 (three) times daily. 30 each 0  . cloNIDine (CATAPRES) 0.1 MG tablet Take 0.1 mg by mouth 2 (two) times daily.    . Cyanocobalamin (RA VITAMIN  B-12 TR) 1000 MCG TBCR Take by mouth.    . lenalidomide (REVLIMID) 15 MG capsule 1 capsule Daily for 3 weeks and then 1 week off 21 capsule 0  . levothyroxine (SYNTHROID) 137 MCG tablet Take 1 tablet by mouth daily.     [name] losartan (COZAAR) 100 MG tablet Take 100 mg by mouth daily.    [name] DELICA LANCETS 25Q MISC Inject 1 Lancet as directed once daily.    . pantoprazole (PROTONIX) 40 MG tablet Take 1 tablet (40 mg total) by mouth daily. 60 tablet 0  . pioglitazone (ACTOS) 30 MG tablet Take 30 mg by mouth daily.    . potassium chloride SA (K-DUR,KLOR-CON) 20 MEQ tablet Take 20 mEq by mouth 2 (two) times daily.   0  . quinapril (ACCUPRIL) 40 MG tablet Take 40 mg by mouth daily.    . sennosides-docusate sodium (SENOKOT-S) 8.6-50 MG tablet Take 2 tablets by mouth daily.     No current facility-administered medications for this visit.    Facility-Administered Medications Ordered in Other Visits  Medication Dose Route Frequency Provider Last Rate Last Dose  . 0.9 %  sodium chloride infusion   Intravenous Continuous Cammie Sickle, MD   Stopped at 12/11/14 1528  . 0.9 %  sodium chloride infusion   Intravenous Continuous Cammie Sickle, MD 10 mL/hr at 12/26/17 1440    . zolendronic acid (ZOMETA) 3 mg in sodium chloride 0.9 % 100 mL IVPB  3 mg Intravenous Once Cammie Sickle, MD        PHYSICAL EXAMINATION: ECOG PERFORMANCE STATUS: 0 - Asymptomatic  BP (!) 167/78   Pulse (!) 53   Temp (!) 97.5 F (36.4 C) (Oral)   Resp 20   Ht _0  (1.778 m)   Wt 235 lb (106.6 kg)   BMI 33.72 kg/m   Filed Weights   12/26/17 1336  Weight: 235 lb (106.6 kg)    Physical Exam  Constitutional: He is oriented to person, place, and time and well-developed, well-nourished, and in no distress.  HENT:  Head: Normocephalic and atraumatic.  Mouth/Throat: Oropharynx is clear and moist. No oropharyngeal exudate.  Eyes: Pupils are equal, round, and reactive to light.  Neck: Normal  range of motion. Neck supple.  Cardiovascular: Normal rate and regular rhythm.  Pulmonary/Chest: No respiratory distress. He has no wheezes.  Abdominal: Soft. Bowel sounds are normal. He exhibits no distension and no mass. There is no tenderness. There is no rebound and no guarding.  Musculoskeletal: Normal range of motion. He exhibits no edema or tenderness.  Neurological: He is alert and oriented to person, place, and time.  Skin: Skin is warm.  Psychiatric: Affect normal.      LABORATORY DATA:  I have reviewed the data as listed    Component Value Date/Time   NA 137 12/26/2017 1306   NA 139 05/01/2014 1322   K 4.2 12/26/2017 1306   K 4.4 05/01/2014 1322   CL 108 12/26/2017 1306   CL 107  05/01/2014 1322   CO2 24 12/26/2017 1306   CO2 27 05/01/2014 1322   GLUCOSE 93 12/26/2017 1306   GLUCOSE 102 (H) 05/01/2014 1322   BUN 21 12/26/2017 1306   BUN 16 05/01/2014 1322   CREATININE 1.33 (H) 12/26/2017 1306   CREATININE 1.48 (H) 05/29/2014 0949   CALCIUM 9.0 12/26/2017 1306   CALCIUM 9.1 05/01/2014 1322   PROT 7.3 12/26/2017 1306   PROT 7.5 05/01/2014 1322   ALBUMIN 3.5 12/26/2017 1306   ALBUMIN 3.6 05/01/2014 1322   AST 14 (L) 12/26/2017 1306   AST 17 05/01/2014 1322   ALT 9 12/26/2017 1306   ALT 13 (L) 05/01/2014 1322   ALKPHOS 40 12/26/2017 1306   ALKPHOS 47 05/01/2014 1322   BILITOT 0.6 12/26/2017 1306   BILITOT 0.6 05/01/2014 1322   GFRNONAA 51 (L) 12/26/2017 1306   GFRNONAA 47 (L) 05/29/2014 0949   GFRAA 60 (L) 12/26/2017 1306   GFRAA 55 (L) 05/29/2014 0949    No results found for: SPEP, UPEP  Lab Results  Component Value Date   WBC 3.1 (L) 12/26/2017   NEUTROABS 1.7 12/26/2017   HGB 11.2 (L) 12/26/2017   HCT 34.9 (L) 12/26/2017   MCV 97.5 12/26/2017   PLT 159 12/26/2017      Chemistry      Component Value Date/Time   NA 137 12/26/2017 1306   NA 139 05/01/2014 1322   K 4.2 12/26/2017 1306   K 4.4 05/01/2014 1322   CL 108 12/26/2017 1306   CL 107  05/01/2014 1322   CO2 24 12/26/2017 1306   CO2 27 05/01/2014 1322   BUN 21 12/26/2017 1306   BUN 16 05/01/2014 1322   CREATININE 1.33 (H) 12/26/2017 1306   CREATININE 1.48 (H) 05/29/2014 0949      Component Value Date/Time   CALCIUM 9.0 12/26/2017 1306   CALCIUM 9.1 05/01/2014 1322   ALKPHOS 40 12/26/2017 1306   ALKPHOS 47 05/01/2014 1322   AST 14 (L) 12/26/2017 1306   AST 17 05/01/2014 1322   ALT 9 12/26/2017 1306   ALT 13 (L) 05/01/2014 1322   BILITOT 0.6 12/26/2017 1306   BILITOT 0.6 05/01/2014 1322       RADIOGRAPHIC STUDIES: I have personally reviewed the radiological images as listed and agreed with the findings in the report. No results found.   ASSESSMENT & PLAN:  Multiple myeloma in remission ([place]) #Multiple myeloma-on maintenance Revlimid 15 mg 3 weeks/1 week off.  Stable  #No clinical evidence of progression; myeloma panel May 2019- Negative M protein/slightly abnormal K/L ration ~2.0  Labs from today pending.    #Chronic kidney disease creatinine 1.4; recent elevation up to 1.89; currently Stable.  # cellulitis/spider bite- s/p anti-biotics- resolved.   #Bone modifying agent/multiple myeloma-continue Zometa every 3 months.  No side effects noted.  Calcium 8.9.  Continue calcium plus vitamin D.  Stable.  #  DISPOSITION: # Zometa today. # Monthly  Labs- cbc/bmp # Follow up in 3 months/MD- Zometa. CBC CMP/MM panel; K/L light chains-few days prior/    No orders of the defined types were placed in this encounter.  All questions were answered. The patient knows to call the clinic with any problems, questions or concerns.      Cammie Sickle, MD 12/26/2017 2:44 PM

## 2017-12-26 NOTE — Assessment & Plan Note (Addendum)
#  Multiple myeloma-on maintenance Revlimid 15 mg 3 weeks/1 week off.  Stable  #No clinical evidence of progression; myeloma panel May 2019- Negative M protein/slightly abnormal K/L ration ~2.0  Labs from today pending.    #Chronic kidney disease creatinine 1.4; recent elevation up to 1.89; currently Stable.  # cellulitis/spider bite- s/p anti-biotics- resolved.   #Bone modifying agent/multiple myeloma-continue Zometa every 3 months.  No side effects noted.  Calcium 8.9.  Continue calcium plus vitamin D.  Stable.  #  DISPOSITION: # Zometa today. # Monthly  Labs- cbc/bmp # Follow up in 3 months/MD- Zometa. CBC CMP/MM panel; K/L light chains-few days prior/

## 2017-12-27 LAB — KAPPA/LAMBDA LIGHT CHAINS
Kappa free light chain: 92.2 mg/L — ABNORMAL HIGH (ref 3.3–19.4)
Kappa, lambda light chain ratio: 1.57 (ref 0.26–1.65)
LAMDA FREE LIGHT CHAINS: 58.7 mg/L — AB (ref 5.7–26.3)

## 2017-12-28 LAB — MULTIPLE MYELOMA PANEL, SERUM
ALBUMIN SERPL ELPH-MCNC: 3.5 g/dL (ref 2.9–4.4)
ALBUMIN/GLOB SERPL: 1.1 (ref 0.7–1.7)
ALPHA 1: 0.2 g/dL (ref 0.0–0.4)
Alpha2 Glob SerPl Elph-Mcnc: 0.6 g/dL (ref 0.4–1.0)
B-GLOBULIN SERPL ELPH-MCNC: 1.2 g/dL (ref 0.7–1.3)
GAMMA GLOB SERPL ELPH-MCNC: 1.5 g/dL (ref 0.4–1.8)
GLOBULIN, TOTAL: 3.5 g/dL (ref 2.2–3.9)
IGA: 634 mg/dL — AB (ref 61–437)
IGG (IMMUNOGLOBIN G), SERUM: 1604 mg/dL — AB (ref 700–1600)
IgM (Immunoglobulin M), Srm: 43 mg/dL (ref 15–143)
Total Protein ELP: 7 g/dL (ref 6.0–8.5)

## 2017-12-31 ENCOUNTER — Other Ambulatory Visit: Payer: Self-pay | Admitting: *Deleted

## 2017-12-31 DIAGNOSIS — C9001 Multiple myeloma in remission: Secondary | ICD-10-CM

## 2017-12-31 MED ORDER — LENALIDOMIDE 15 MG PO CAPS: ORAL_CAPSULE | ORAL | 0 refills | Status: DC

## 2018-01-13 ENCOUNTER — Other Ambulatory Visit: Payer: Self-pay | Admitting: Oncology

## 2018-01-22 ENCOUNTER — Other Ambulatory Visit: Payer: Self-pay | Admitting: *Deleted

## 2018-01-22 DIAGNOSIS — C9001 Multiple myeloma in remission: Secondary | ICD-10-CM

## 2018-01-23 ENCOUNTER — Inpatient Hospital Stay: Payer: Medicare HMO | Attending: Internal Medicine

## 2018-01-23 DIAGNOSIS — C9001 Multiple myeloma in remission: Secondary | ICD-10-CM | POA: Insufficient documentation

## 2018-01-23 LAB — CBC WITH DIFFERENTIAL/PLATELET
ABS IMMATURE GRANULOCYTES: 0.01 10*3/uL (ref 0.00–0.07)
BASOS PCT: 1 %
Basophils Absolute: 0 10*3/uL (ref 0.0–0.1)
Eosinophils Absolute: 0.2 10*3/uL (ref 0.0–0.5)
Eosinophils Relative: 7 %
HCT: 34.7 % — ABNORMAL LOW (ref 39.0–52.0)
HEMOGLOBIN: 11.1 g/dL — AB (ref 13.0–17.0)
Immature Granulocytes: 0 %
Lymphocytes Relative: 29 %
Lymphs Abs: 0.8 10*3/uL (ref 0.7–4.0)
MCH: 31.6 pg (ref 26.0–34.0)
MCHC: 32 g/dL (ref 30.0–36.0)
MCV: 98.9 fL (ref 80.0–100.0)
MONO ABS: 0.4 10*3/uL (ref 0.1–1.0)
MONOS PCT: 16 %
NEUTROS ABS: 1.2 10*3/uL — AB (ref 1.7–7.7)
Neutrophils Relative %: 47 %
PLATELETS: 178 10*3/uL (ref 150–400)
RBC: 3.51 MIL/uL — ABNORMAL LOW (ref 4.22–5.81)
RDW: 16.5 % — ABNORMAL HIGH (ref 11.5–15.5)
WBC: 2.6 10*3/uL — ABNORMAL LOW (ref 4.0–10.5)
nRBC: 0 % (ref 0.0–0.2)

## 2018-01-23 LAB — BASIC METABOLIC PANEL
Anion gap: 6 (ref 5–15)
BUN: 16 mg/dL (ref 8–23)
CO2: 24 mmol/L (ref 22–32)
Calcium: 8.8 mg/dL — ABNORMAL LOW (ref 8.9–10.3)
Chloride: 112 mmol/L — ABNORMAL HIGH (ref 98–111)
Creatinine, Ser: 1.54 mg/dL — ABNORMAL HIGH (ref 0.61–1.24)
GFR calc Af Amer: 51 mL/min — ABNORMAL LOW (ref >60)
GFR, EST NON AFRICAN AMERICAN: 44 mL/min — AB (ref >60)
GLUCOSE: 87 mg/dL (ref 70–99)
POTASSIUM: 4.8 mmol/L (ref 3.5–5.1)
Sodium: 142 mmol/L (ref 135–145)

## 2018-02-04 ENCOUNTER — Other Ambulatory Visit: Payer: Self-pay | Admitting: *Deleted

## 2018-02-04 DIAGNOSIS — C9001 Multiple myeloma in remission: Secondary | ICD-10-CM

## 2018-02-04 MED ORDER — LENALIDOMIDE 15 MG PO CAPS: ORAL_CAPSULE | ORAL | 0 refills | Status: DC

## 2018-02-04 NOTE — Telephone Encounter (Signed)
)   Ref Range & Units 12d ago (01/23/18) 11mo ago (12/26/17) 27mo ago (11/28/17) 59mo ago (11/15/17) 73mo ago (10/31/17) 108mo ago (10/03/17) 33mo ago (06/27/17)  WBC 4.0 - 10.5 K/uL 2.6Low   3.1Low   4.2  4.4  3.3Low  R 3.1Low  R 5.0 R  RBC 4.22 - 5.81 MIL/uL 3.51Low   3.58Low   3.65Low   4.02Low   3.69Low  R 3.77Low  R 3.75Low  R  Hemoglobin 13.0 - 17.0 g/dL 11.1Low   11.2Low   11.2Low   13.0  12.1Low  R 12.3Low  R 12.3Low  R  HCT 39.0 - 52.0 % 34.7Low   34.9Low   35.4Low   38.7Low   35.9Low  R 37.0Low  R 36.3Low  R  MCV 80.0 - 100.0 fL 98.9  97.5  97.0  96.3  97.3  98.1  96.9   MCH 26.0 - 34.0 pg 31.6  31.3  30.7  32.3  32.8  32.7  32.7   MCHC 30.0 - 36.0 g/dL 32.0  32.1  31.6  33.6  33.7 R 33.3 R 33.8 R  RDW 11.5 - 15.5 % 16.5High   15.9High   14.6  14.6  16.2High  R 16.8High  R 16.5High  R  Platelets 150 - 400 K/uL 178  159  183  165  165 R 174 R 206 R  nRBC 0.0 - 0.2 % 0.0  0.0  0.0  0.0      Neutrophils Relative % % 47  55  64  69  46  52  69   Neutro Abs 1.7 - 7.7 K/uL 1.2Low   1.7  2.7  3.1  1.5 R 1.6 R 3.5 R  Lymphocytes Relative % 29  24  19  12  26  23  15    Lymphs Abs 0.7 - 4.0 K/uL 0.8  0.7  0.8  0.5Low   0.9Low  R 0.7Low  R 0.7Low  R  Monocytes Relative % 16  13  10  14  18  16  13    Monocytes Absolute 0.1 - 1.0 K/uL 0.4  0.4  0.4  0.6  0.6 R 0.5 R 0.6 R  Eosinophils Relative % 7  6  6  3  9  8  2    Eosinophils Absolute 0.0 - 0.5 K/uL 0.2  0.2  0.3  0.1  0.3 R 0.2 R 0.1 R  Basophils Relative % 1  1  0  1  1  1  1    Basophils Absolute 0.0 - 0.1 K/uL 0.0  0.0  0.0  0.0  0.0 R, CM 0.0 R, CM 0.0 R, CM  Immature Granulocytes % 0  1  1  1       Abs Immature Granulocytes 0.00 - 0.07 K/uL 0.01  0.04 CM 0.04 CM 0.02 CM     Comment: Performed at [facility], [address] [place], [place] [address]  Resulting Agency  [facility] CLIN LAB [place] [facility] [place] [facility] [place] [facility] [place] [facility] [place] [facility] [place] [facility]      Specimen Collected: 01/23/18 13:55  Last Resulted: 01/23/18 14:11       )   Ref Range & Units 12d ago (01/23/18) 37mo ago (12/26/17) 3mo ago (12/06/17) 30mo ago (11/28/17) 53mo ago (11/15/17) 36mo ago (10/31/17) 66mo ago (10/03/17)  Sodium 135 - 145 mmol/L 142  137  141  140  141  142  139  Potassium 3.5 - 5.1 mmol/L 4.8  4.2  4.5  4.6  4.3  4.2  4.2   Chloride 98 - 111 mmol/L 112High   108  113High   113High   109  110  109   CO2 22 - 32 mmol/L 24  24  24  23  23  26  25    Glucose, Bld 70 - 99 mg/dL 87  93  151High   106High   130High   100High   115High    BUN 8 - 23 mg/dL 16  21  17  16  20  17  15    Creatinine, Ser 0.61 - 1.24 mg/dL 1.54High   1.33High   1.47High   1.95High   1.49High   1.42High   1.33High    Calcium 8.9 - 10.3 mg/dL 8.8Low   9.0  8.3Low   8.9  9.0  8.7Low   8.7Low    GFR calc non Af Amer >60 mL/min 44Low   51Low   46Low   32Low   45Low   47Low   51Low    GFR calc Af Amer >60 mL/min 51Low   60Low  CM 53Low  CM 38Low  CM 52Low  CM 55Low  CM 60Low  CM  Anion gap 5 - 15 6  5  CM 4Low  CM 4Low  CM 9 CM 6 CM 5 CM  Comment: Performed at [facility], [address] [place], [place] [address]  Resulting Agency  [facility] CLIN LAB [place] [facility] [place] CL[facility] LAB [place] [facility] [place] [facility] [place] [facility] [place] [facility]      Specimen Collected: 01/23/18 13:55  Last Resulted: 01/23/18 14:25

## 2018-02-20 ENCOUNTER — Inpatient Hospital Stay: Payer: Medicare HMO | Attending: Internal Medicine

## 2018-02-20 DIAGNOSIS — C9001 Multiple myeloma in remission: Secondary | ICD-10-CM | POA: Insufficient documentation

## 2018-02-20 LAB — BASIC METABOLIC PANEL
ANION GAP: 4 — AB (ref 5–15)
BUN: 15 mg/dL (ref 8–23)
CALCIUM: 8.8 mg/dL — AB (ref 8.9–10.3)
CO2: 27 mmol/L (ref 22–32)
CREATININE: 1.55 mg/dL — AB (ref 0.61–1.24)
Chloride: 108 mmol/L (ref 98–111)
GFR, EST AFRICAN AMERICAN: 50 mL/min — AB (ref >60)
GFR, EST NON AFRICAN AMERICAN: 43 mL/min — AB (ref >60)
Glucose, Bld: 100 mg/dL — ABNORMAL HIGH (ref 70–99)
Potassium: 4.6 mmol/L (ref 3.5–5.1)
SODIUM: 139 mmol/L (ref 135–145)

## 2018-02-20 LAB — CBC WITH DIFFERENTIAL/PLATELET
Abs Immature Granulocytes: 0.03 10*3/uL (ref 0.00–0.07)
BASOS ABS: 0 10*3/uL (ref 0.0–0.1)
Basophils Relative: 1 %
Eosinophils Absolute: 0.2 10*3/uL (ref 0.0–0.5)
Eosinophils Relative: 6 %
HCT: 35 % — ABNORMAL LOW (ref 39.0–52.0)
Hemoglobin: 11.4 g/dL — ABNORMAL LOW (ref 13.0–17.0)
IMMATURE GRANULOCYTES: 1 %
Lymphocytes Relative: 26 %
Lymphs Abs: 0.9 10*3/uL (ref 0.7–4.0)
MCH: 31.9 pg (ref 26.0–34.0)
MCHC: 32.6 g/dL (ref 30.0–36.0)
MCV: 98 fL (ref 80.0–100.0)
Monocytes Absolute: 0.7 10*3/uL (ref 0.1–1.0)
Monocytes Relative: 20 %
NEUTROS PCT: 46 %
NRBC: 0 % (ref 0.0–0.2)
Neutro Abs: 1.7 10*3/uL (ref 1.7–7.7)
PLATELETS: 202 10*3/uL (ref 150–400)
RBC: 3.57 MIL/uL — AB (ref 4.22–5.81)
RDW: 14.9 % (ref 11.5–15.5)
WBC: 3.6 10*3/uL — AB (ref 4.0–10.5)

## 2018-03-04 ENCOUNTER — Other Ambulatory Visit: Payer: Self-pay | Admitting: *Deleted

## 2018-03-04 DIAGNOSIS — C9001 Multiple myeloma in remission: Secondary | ICD-10-CM

## 2018-03-06 MED ORDER — LENALIDOMIDE 15 MG PO CAPS: ORAL_CAPSULE | ORAL | 0 refills | Status: DC

## 2018-03-06 NOTE — Addendum Note (Signed)
Addended by: Sabino Gasser on: 03/06/2018 08:35 AM   Modules accepted: Orders

## 2018-03-12 ENCOUNTER — Other Ambulatory Visit: Payer: Self-pay | Admitting: Oncology

## 2018-03-25 ENCOUNTER — Inpatient Hospital Stay: Payer: Medicare HMO | Attending: Internal Medicine

## 2018-03-25 DIAGNOSIS — I129 Hypertensive chronic kidney disease with stage 1 through stage 4 chronic kidney disease, or unspecified chronic kidney disease: Secondary | ICD-10-CM | POA: Insufficient documentation

## 2018-03-25 DIAGNOSIS — Z7982 Long term (current) use of aspirin: Secondary | ICD-10-CM | POA: Insufficient documentation

## 2018-03-25 DIAGNOSIS — N183 Chronic kidney disease, stage 3 (moderate): Secondary | ICD-10-CM | POA: Insufficient documentation

## 2018-03-25 DIAGNOSIS — Z79899 Other long term (current) drug therapy: Secondary | ICD-10-CM | POA: Insufficient documentation

## 2018-03-25 DIAGNOSIS — E785 Hyperlipidemia, unspecified: Secondary | ICD-10-CM | POA: Insufficient documentation

## 2018-03-25 DIAGNOSIS — C9001 Multiple myeloma in remission: Secondary | ICD-10-CM

## 2018-03-25 DIAGNOSIS — E1122 Type 2 diabetes mellitus with diabetic chronic kidney disease: Secondary | ICD-10-CM | POA: Insufficient documentation

## 2018-03-25 DIAGNOSIS — K219 Gastro-esophageal reflux disease without esophagitis: Secondary | ICD-10-CM | POA: Diagnosis not present

## 2018-03-25 LAB — CBC WITH DIFFERENTIAL/PLATELET
ABS IMMATURE GRANULOCYTES: 0.03 10*3/uL (ref 0.00–0.07)
BASOS ABS: 0.1 10*3/uL (ref 0.0–0.1)
Basophils Relative: 2 %
EOS PCT: 12 %
Eosinophils Absolute: 0.3 10*3/uL (ref 0.0–0.5)
HEMATOCRIT: 37.4 % — AB (ref 39.0–52.0)
HEMOGLOBIN: 12.1 g/dL — AB (ref 13.0–17.0)
Immature Granulocytes: 1 %
LYMPHS ABS: 0.7 10*3/uL (ref 0.7–4.0)
LYMPHS PCT: 25 %
MCH: 31.7 pg (ref 26.0–34.0)
MCHC: 32.4 g/dL (ref 30.0–36.0)
MCV: 97.9 fL (ref 80.0–100.0)
MONO ABS: 0.4 10*3/uL (ref 0.1–1.0)
Monocytes Relative: 16 %
NEUTROS ABS: 1.1 10*3/uL — AB (ref 1.7–7.7)
Neutrophils Relative %: 44 %
Platelets: 155 10*3/uL (ref 150–400)
RBC: 3.82 MIL/uL — ABNORMAL LOW (ref 4.22–5.81)
RDW: 14.2 % (ref 11.5–15.5)
WBC: 2.6 10*3/uL — ABNORMAL LOW (ref 4.0–10.5)
nRBC: 0 % (ref 0.0–0.2)

## 2018-03-25 LAB — COMPREHENSIVE METABOLIC PANEL
ALT: 12 U/L (ref 0–44)
AST: 16 U/L (ref 15–41)
Albumin: 3.3 g/dL — ABNORMAL LOW (ref 3.5–5.0)
Alkaline Phosphatase: 45 U/L (ref 38–126)
Anion gap: 6 (ref 5–15)
BUN: 17 mg/dL (ref 8–23)
CHLORIDE: 109 mmol/L (ref 98–111)
CO2: 25 mmol/L (ref 22–32)
CREATININE: 1.46 mg/dL — AB (ref 0.61–1.24)
Calcium: 8.6 mg/dL — ABNORMAL LOW (ref 8.9–10.3)
GFR calc Af Amer: 54 mL/min — ABNORMAL LOW (ref >60)
GFR calc non Af Amer: 47 mL/min — ABNORMAL LOW (ref >60)
GLUCOSE: 101 mg/dL — AB (ref 70–99)
Potassium: 4.3 mmol/L (ref 3.5–5.1)
Sodium: 140 mmol/L (ref 135–145)
Total Bilirubin: 0.4 mg/dL (ref 0.3–1.2)
Total Protein: 7.2 g/dL (ref 6.5–8.1)

## 2018-03-26 LAB — KAPPA/LAMBDA LIGHT CHAINS
KAPPA FREE LGHT CHN: 78.2 mg/L — AB (ref 3.3–19.4)
KAPPA, LAMDA LIGHT CHAIN RATIO: 1.53 (ref 0.26–1.65)
LAMDA FREE LIGHT CHAINS: 51 mg/L — AB (ref 5.7–26.3)

## 2018-03-27 ENCOUNTER — Inpatient Hospital Stay: Payer: Medicare HMO

## 2018-03-27 ENCOUNTER — Inpatient Hospital Stay (HOSPITAL_BASED_OUTPATIENT_CLINIC_OR_DEPARTMENT_OTHER): Payer: Medicare HMO | Admitting: Internal Medicine

## 2018-03-27 ENCOUNTER — Encounter: Payer: Self-pay | Admitting: Internal Medicine

## 2018-03-27 VITALS — BP 154/87 | HR 49 | Temp 97.2°F | Resp 16 | Wt 243.2 lb

## 2018-03-27 DIAGNOSIS — C9001 Multiple myeloma in remission: Secondary | ICD-10-CM | POA: Diagnosis not present

## 2018-03-27 DIAGNOSIS — E785 Hyperlipidemia, unspecified: Secondary | ICD-10-CM

## 2018-03-27 DIAGNOSIS — I129 Hypertensive chronic kidney disease with stage 1 through stage 4 chronic kidney disease, or unspecified chronic kidney disease: Secondary | ICD-10-CM

## 2018-03-27 DIAGNOSIS — N183 Chronic kidney disease, stage 3 (moderate): Secondary | ICD-10-CM

## 2018-03-27 DIAGNOSIS — Z79899 Other long term (current) drug therapy: Secondary | ICD-10-CM

## 2018-03-27 DIAGNOSIS — Z7982 Long term (current) use of aspirin: Secondary | ICD-10-CM

## 2018-03-27 DIAGNOSIS — E1122 Type 2 diabetes mellitus with diabetic chronic kidney disease: Secondary | ICD-10-CM

## 2018-03-27 DIAGNOSIS — K219 Gastro-esophageal reflux disease without esophagitis: Secondary | ICD-10-CM

## 2018-03-27 LAB — MULTIPLE MYELOMA PANEL, SERUM
ALBUMIN SERPL ELPH-MCNC: 3.2 g/dL (ref 2.9–4.4)
ALPHA2 GLOB SERPL ELPH-MCNC: 0.5 g/dL (ref 0.4–1.0)
Albumin/Glob SerPl: 1.1 (ref 0.7–1.7)
Alpha 1: 0.2 g/dL (ref 0.0–0.4)
B-GLOBULIN SERPL ELPH-MCNC: 1 g/dL (ref 0.7–1.3)
Gamma Glob SerPl Elph-Mcnc: 1.4 g/dL (ref 0.4–1.8)
Globulin, Total: 3.2 g/dL (ref 2.2–3.9)
IGG (IMMUNOGLOBIN G), SERUM: 1427 mg/dL (ref 700–1600)
IGM (IMMUNOGLOBULIN M), SRM: 37 mg/dL (ref 15–143)
IgA: 563 mg/dL — ABNORMAL HIGH (ref 61–437)
TOTAL PROTEIN ELP: 6.4 g/dL (ref 6.0–8.5)

## 2018-03-27 MED ORDER — SODIUM CHLORIDE 0.9 % IV SOLN
INTRAVENOUS | Status: DC
  Administered 2018-03-27: 14:00:00 via INTRAVENOUS
  Filled 2018-03-27: qty 250

## 2018-03-27 MED ORDER — ZOLEDRONIC ACID 4 MG/5ML IV CONC
3.0000 mg | Freq: Once | INTRAVENOUS | Status: AC
  Administered 2018-03-27: 3 mg via INTRAVENOUS
  Filled 2018-03-27: qty 3.75

## 2018-03-27 NOTE — Progress Notes (Signed)
[facility] OFFICE PROGRESS NOTE  Patient Care Team: Baxter Hire, MD as PCP - General (Internal Medicine) Druscilla Brownie, MD as Consulting Physician (Dermatology)  Cancer Staging No matching staging information was found for the patient.   Oncology History    # April 2014- MULTIPLE MYELOMA [IgG- 2.2gm/dl; 40-50% plasma cell; Cyto-N; FISH- gain of chr.9, 15 & ccnd1/11q13] s/p RVD x6 [sep 2014; BMBx- ~5% plasma cells]; Maint Rev-DEX-Zometa; March 2015-Stopped Dex Cont- Rev-Zometa; July 2016- Rev 36m; NOV 4th  2016-HOLD; [facility] declined BMT.   # Re-START FEB 2017 Rev 127m3 W- on & 1 w OFF.   # Zometa q 72M  # CKD [creat ~1.4; sec MM]; DM  -------------------------------------------------------------------------   DIAGNOSIS: [2Darrin.Alexander MULTIPLE MYELOMA  GOALS: control  CURRENT/MOST RECENT THERAPY- REVLIMID Maintenance [Feb 2017]      Multiple myeloma in remission ([facility]  10/01/2015 Initial Diagnosis    Multiple myeloma in remission ([facility]      INTERVAL HISTORY:  [name]42.o.  male pleasant patient above history of multiple myeloma currently on Revlimid maintenance is here for follow-up.  Patient has not had any recent hospitalization or visits to the emergency room.  Appetite is good.  No bone pain.  No nausea no vomiting.  No tingling numbness.  No jaw pain.  Review of Systems  Constitutional: Negative for chills, diaphoresis, fever, malaise/fatigue and weight loss.  HENT: Negative for nosebleeds and sore throat.   Eyes: Negative for double vision.  Respiratory: Negative for cough, hemoptysis, sputum production, shortness of breath and wheezing.   Cardiovascular: Negative for chest pain, palpitations, orthopnea and leg swelling.  Gastrointestinal: Negative for abdominal pain, blood in stool, constipation, diarrhea, heartburn, melena, nausea and vomiting.  Genitourinary: Negative for dysuria, frequency and urgency.   Musculoskeletal: Negative for back pain and joint pain.  Skin: Negative.  Negative for itching and rash.  Neurological: Negative for dizziness, tingling, focal weakness, weakness and headaches.  Endo/Heme/Allergies: Does not bruise/bleed easily.  Psychiatric/Behavioral: Negative for depression. The patient is not nervous/anxious and does not have insomnia.       PAST MEDICAL HISTORY :  Past Medical History:  Diagnosis Date  . Diabetes mellitus without complication ([facility]  . GERD (gastroesophageal reflux disease)   . Hyperlipidemia   . Hypertension   . Multiple myeloma ([facility]  . Obesity   . Thyroid cancer ([facility]  . Thyroid disease     PAST SURGICAL HISTORY :   Past Surgical History:  Procedure Laterality Date  . BONE MARROW BIOPSY  2014  . CATARACT EXTRACTION BILATERAL W/ ANTERIOR VITRECTOMY      FAMILY HISTORY :   Family History  Problem Relation Age of Onset  . Cancer Father [id number]. Diabetes Mother     SOCIAL HISTORY:   Social History   Tobacco Use  . Smoking status: Never Smoker  . Smokeless tobacco: Never Used  Substance Use Topics  . Alcohol use: No  . Drug use: No    ALLERGIES:  has No Known Allergies.  MEDICATIONS:  Current Outpatient Medications  Medication Sig Dispense Refill  . acyclovir (ZOVIRAX) 400 MG tablet 400 mg 2 (two) times daily.    . Marland KitchenmLODipine (NORVASC) 10 MG tablet Take 10 mg by mouth daily.    . Marland Kitchenspirin EC 81 MG tablet Take 81 mg by mouth daily.    . Marland Kitchentorvastatin (LIPITOR) 20 MG tablet Take 20 mg by mouth daily.    .[name]  baclofen (LIORESAL) 10 MG tablet Take 0.5 tablets (5 mg total) by mouth 3 (three) times daily. 30 each 0  . cloNIDine (CATAPRES) 0.1 MG tablet Take 0.1 mg by mouth 2 (two) times daily.    . Cyanocobalamin (RA VITAMIN B-12 TR) 1000 MCG TBCR Take by mouth.    . lenalidomide (REVLIMID) 15 MG capsule 1 capsule Daily for 3 weeks and then 1 week off 21 capsule 0  . levothyroxine (SYNTHROID) 137 MCG tablet Take 1 tablet by mouth  daily.     [name] losartan (COZAAR) 100 MG tablet Take 100 mg by mouth daily.    [name] DELICA LANCETS 69C MISC Inject 1 Lancet as directed once daily.    . pantoprazole (PROTONIX) 40 MG tablet TAKE 1 TABLET BY MOUTH EVERY DAY 60 tablet 3  . pioglitazone (ACTOS) 30 MG tablet Take 30 mg by mouth daily.    . potassium chloride SA (K-DUR,KLOR-CON) 20 MEQ tablet Take 20 mEq by mouth 2 (two) times daily.   0  . quinapril (ACCUPRIL) 40 MG tablet Take 40 mg by mouth daily.    . sennosides-docusate sodium (SENOKOT-S) 8.6-50 MG tablet Take 2 tablets by mouth daily.     No current facility-administered medications for this visit.    Facility-Administered Medications Ordered in Other Visits  Medication Dose Route Frequency Provider Last Rate Last Dose  . 0.9 %  sodium chloride infusion   Intravenous Continuous Cammie Sickle, MD   Stopped at 12/11/14 1528    PHYSICAL EXAMINATION: ECOG PERFORMANCE STATUS: 0 - Asymptomatic  BP (!) 154/87 (BP Location: Left Arm, Patient Position: Sitting, Cuff Size: Normal)   Pulse (!) 49   Temp (!) 97.2 F (36.2 C) (Tympanic)   Resp 16   Wt 243 lb 3.2 oz (110.3 kg)   BMI 34.90 kg/m   Filed Weights   03/27/18 1301  Weight: 243 lb 3.2 oz (110.3 kg)    Physical Exam  Constitutional: He is oriented to person, place, and time and well-developed, well-nourished, and in no distress.  HENT:  Head: Normocephalic and atraumatic.  Mouth/Throat: Oropharynx is clear and moist. No oropharyngeal exudate.  Eyes: Pupils are equal, round, and reactive to light.  Neck: Normal range of motion. Neck supple.  Cardiovascular: Normal rate and regular rhythm.  Pulmonary/Chest: No respiratory distress. He has no wheezes.  Abdominal: Soft. Bowel sounds are normal. He exhibits no distension and no mass. There is no abdominal tenderness. There is no rebound and no guarding.  Musculoskeletal: Normal range of motion.        General: No tenderness or edema.  Neurological: He  is alert and oriented to person, place, and time.  Skin: Skin is warm.  Psychiatric: Affect normal.      LABORATORY DATA:  I have reviewed the data as listed    Component Value Date/Time   NA 140 03/25/2018 1123   NA 139 05/01/2014 1322   K 4.3 03/25/2018 1123   K 4.4 05/01/2014 1322   CL 109 03/25/2018 1123   CL 107 05/01/2014 1322   CO2 25 03/25/2018 1123   CO2 27 05/01/2014 1322   GLUCOSE 101 (H) 03/25/2018 1123   GLUCOSE 102 (H) 05/01/2014 1322   BUN 17 03/25/2018 1123   BUN 16 05/01/2014 1322   CREATININE 1.46 (H) 03/25/2018 1123   CREATININE 1.48 (H) 05/29/2014 0949   CALCIUM 8.6 (L) 03/25/2018 1123   CALCIUM 9.1 05/01/2014 1322   PROT 7.2 03/25/2018 1123   PROT 7.5  05/01/2014 1322   ALBUMIN 3.3 (L) 03/25/2018 1123   ALBUMIN 3.6 05/01/2014 1322   AST 16 03/25/2018 1123   AST 17 05/01/2014 1322   ALT 12 03/25/2018 1123   ALT 13 (L) 05/01/2014 1322   ALKPHOS 45 03/25/2018 1123   ALKPHOS 47 05/01/2014 1322   BILITOT 0.4 03/25/2018 1123   BILITOT 0.6 05/01/2014 1322   GFRNONAA 47 (L) 03/25/2018 1123   GFRNONAA 47 (L) 05/29/2014 0949   GFRAA 54 (L) 03/25/2018 1123   GFRAA 55 (L) 05/29/2014 0949    No results found for: SPEP, UPEP  Lab Results  Component Value Date   WBC 2.6 (L) 03/25/2018   NEUTROABS 1.1 (L) 03/25/2018   HGB 12.1 (L) 03/25/2018   HCT 37.4 (L) 03/25/2018   MCV 97.9 03/25/2018   PLT 155 03/25/2018      Chemistry      Component Value Date/Time   NA 140 03/25/2018 1123   NA 139 05/01/2014 1322   K 4.3 03/25/2018 1123   K 4.4 05/01/2014 1322   CL 109 03/25/2018 1123   CL 107 05/01/2014 1322   CO2 25 03/25/2018 1123   CO2 27 05/01/2014 1322   BUN 17 03/25/2018 1123   BUN 16 05/01/2014 1322   CREATININE 1.46 (H) 03/25/2018 1123   CREATININE 1.48 (H) 05/29/2014 0949      Component Value Date/Time   CALCIUM 8.6 (L) 03/25/2018 1123   CALCIUM 9.1 05/01/2014 1322   ALKPHOS 45 03/25/2018 1123   ALKPHOS 47 05/01/2014 1322   AST 16  03/25/2018 1123   AST 17 05/01/2014 1322   ALT 12 03/25/2018 1123   ALT 13 (L) 05/01/2014 1322   BILITOT 0.4 03/25/2018 1123   BILITOT 0.6 05/01/2014 1322       RADIOGRAPHIC STUDIES: I have personally reviewed the radiological images as listed and agreed with the findings in the report. No results found.   ASSESSMENT & PLAN:  Multiple myeloma in remission ([place]) #Multiple myeloma-on maintenance Revlimid 15 mg 3 weeks/1 week off.  Stable  #No clinical evidence of progression; myeloma panel NO 2019- Negative M protein; feb 2020- pending; ; Feb 2020-abnormal K/L ratio=Normal.   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   #Bone modifying agent/multiple myeloma-continue Zometa every 3 months.  No side effects noted.  Calcium 8.6.  Continue calcium plus vitamin D. STABLE.   #  DISPOSITION: # Zometa today. # cbc/cmp every month x 2 [march and april];  # Follow up in 3 months/MD- Zometa. CBC CMP/MM panel; K/L light chains-few days prior   No orders of the defined types were placed in this encounter.  All questions were answered. The patient knows to call the clinic with any problems, questions or concerns.      Cammie Sickle, MD 03/27/2018 7:31 PM

## 2018-03-27 NOTE — Assessment & Plan Note (Addendum)
#  Multiple myeloma-on maintenance Revlimid 15 mg 3 weeks/1 week off.  Stable  #No clinical evidence of progression; myeloma panel NO 2019- Negative M protein; feb 2020- pending; ; Feb 2020-abnormal K/L ratio=Normal.   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   #Bone modifying agent/multiple myeloma-continue Zometa every 3 months.  No side effects noted.  Calcium 8.6.  Continue calcium plus vitamin D. STABLE.   #  DISPOSITION: # Zometa today. # cbc/cmp every month x 2 [march and april];  # Follow up in 3 months/MD- Zometa. CBC CMP/MM panel; K/L light chains-few days prior

## 2018-03-29 ENCOUNTER — Other Ambulatory Visit: Payer: Self-pay | Admitting: *Deleted

## 2018-03-29 DIAGNOSIS — C9001 Multiple myeloma in remission: Secondary | ICD-10-CM

## 2018-03-29 MED ORDER — LENALIDOMIDE 15 MG PO CAPS: ORAL_CAPSULE | ORAL | 0 refills | Status: DC

## 2018-04-22 ENCOUNTER — Other Ambulatory Visit: Payer: Self-pay

## 2018-04-22 DIAGNOSIS — C9001 Multiple myeloma in remission: Secondary | ICD-10-CM

## 2018-04-24 ENCOUNTER — Inpatient Hospital Stay: Payer: Medicare HMO | Attending: Internal Medicine

## 2018-05-02 ENCOUNTER — Other Ambulatory Visit: Payer: Self-pay | Admitting: *Deleted

## 2018-05-02 DIAGNOSIS — C9001 Multiple myeloma in remission: Secondary | ICD-10-CM

## 2018-05-03 MED ORDER — LENALIDOMIDE 15 MG PO CAPS: ORAL_CAPSULE | ORAL | 0 refills | Status: DC

## 2018-05-13 ENCOUNTER — Other Ambulatory Visit: Payer: Self-pay | Admitting: Pharmacist

## 2018-05-13 DIAGNOSIS — C9001 Multiple myeloma in remission: Secondary | ICD-10-CM

## 2018-05-13 MED ORDER — LENALIDOMIDE 15 MG PO CAPS: ORAL_CAPSULE | ORAL | 0 refills | Status: DC

## 2018-05-22 ENCOUNTER — Other Ambulatory Visit: Payer: Self-pay

## 2018-05-22 ENCOUNTER — Inpatient Hospital Stay: Payer: Medicare HMO | Attending: Internal Medicine

## 2018-05-22 DIAGNOSIS — C9001 Multiple myeloma in remission: Secondary | ICD-10-CM | POA: Insufficient documentation

## 2018-05-22 LAB — COMPREHENSIVE METABOLIC PANEL
ALT: 12 U/L (ref 0–44)
AST: 16 U/L (ref 15–41)
Albumin: 3.7 g/dL (ref 3.5–5.0)
Alkaline Phosphatase: 44 U/L (ref 38–126)
Anion gap: 3 — ABNORMAL LOW (ref 5–15)
BUN: 18 mg/dL (ref 8–23)
CO2: 25 mmol/L (ref 22–32)
Calcium: 8.5 mg/dL — ABNORMAL LOW (ref 8.9–10.3)
Chloride: 109 mmol/L (ref 98–111)
Creatinine, Ser: 1.66 mg/dL — ABNORMAL HIGH (ref 0.61–1.24)
GFR calc Af Amer: 46 mL/min — ABNORMAL LOW (ref >60)
GFR calc non Af Amer: 40 mL/min — ABNORMAL LOW (ref >60)
Glucose, Bld: 102 mg/dL — ABNORMAL HIGH (ref 70–99)
Potassium: 4.4 mmol/L (ref 3.5–5.1)
Sodium: 137 mmol/L (ref 135–145)
Total Bilirubin: 0.7 mg/dL (ref 0.3–1.2)
Total Protein: 7.5 g/dL (ref 6.5–8.1)

## 2018-05-22 LAB — CBC WITH DIFFERENTIAL/PLATELET
Abs Immature Granulocytes: 0.02 10*3/uL (ref 0.00–0.07)
Basophils Absolute: 0 10*3/uL (ref 0.0–0.1)
Basophils Relative: 0 %
Eosinophils Absolute: 0.2 10*3/uL (ref 0.0–0.5)
Eosinophils Relative: 8 %
HCT: 36.7 % — ABNORMAL LOW (ref 39.0–52.0)
Hemoglobin: 11.9 g/dL — ABNORMAL LOW (ref 13.0–17.0)
Immature Granulocytes: 1 %
Lymphocytes Relative: 25 %
Lymphs Abs: 0.7 10*3/uL (ref 0.7–4.0)
MCH: 31.6 pg (ref 26.0–34.0)
MCHC: 32.4 g/dL (ref 30.0–36.0)
MCV: 97.6 fL (ref 80.0–100.0)
Monocytes Absolute: 0.4 10*3/uL (ref 0.1–1.0)
Monocytes Relative: 13 %
Neutro Abs: 1.5 10*3/uL — ABNORMAL LOW (ref 1.7–7.7)
Neutrophils Relative %: 53 %
Platelets: 172 10*3/uL (ref 150–400)
RBC: 3.76 MIL/uL — ABNORMAL LOW (ref 4.22–5.81)
RDW: 14.9 % (ref 11.5–15.5)
WBC: 2.8 10*3/uL — ABNORMAL LOW (ref 4.0–10.5)
nRBC: 0 % (ref 0.0–0.2)

## 2018-05-23 LAB — KAPPA/LAMBDA LIGHT CHAINS
Kappa free light chain: 87.6 mg/L — ABNORMAL HIGH (ref 3.3–19.4)
Kappa, lambda light chain ratio: 1.99 — ABNORMAL HIGH (ref 0.26–1.65)
Lambda free light chains: 44.1 mg/L — ABNORMAL HIGH (ref 5.7–26.3)

## 2018-05-23 LAB — MULTIPLE MYELOMA PANEL, SERUM
Albumin SerPl Elph-Mcnc: 3.3 g/dL (ref 2.9–4.4)
Albumin/Glob SerPl: 1 (ref 0.7–1.7)
Alpha 1: 0.2 g/dL (ref 0.0–0.4)
Alpha2 Glob SerPl Elph-Mcnc: 0.6 g/dL (ref 0.4–1.0)
B-Globulin SerPl Elph-Mcnc: 1.1 g/dL (ref 0.7–1.3)
Gamma Glob SerPl Elph-Mcnc: 1.5 g/dL (ref 0.4–1.8)
Globulin, Total: 3.5 g/dL (ref 2.2–3.9)
IgA: 551 mg/dL — ABNORMAL HIGH (ref 61–437)
IgG (Immunoglobin G), Serum: 1445 mg/dL (ref 603–1613)
IgM (Immunoglobulin M), Srm: 33 mg/dL (ref 15–143)
Total Protein ELP: 6.8 g/dL (ref 6.0–8.5)

## 2018-06-05 ENCOUNTER — Other Ambulatory Visit: Payer: Self-pay | Admitting: *Deleted

## 2018-06-05 DIAGNOSIS — C9001 Multiple myeloma in remission: Secondary | ICD-10-CM

## 2018-06-05 MED ORDER — LENALIDOMIDE 15 MG PO CAPS: ORAL_CAPSULE | ORAL | 0 refills | Status: DC

## 2018-06-24 ENCOUNTER — Inpatient Hospital Stay: Payer: Medicare HMO

## 2018-06-26 ENCOUNTER — Ambulatory Visit: Payer: Medicare HMO | Admitting: Internal Medicine

## 2018-06-26 ENCOUNTER — Ambulatory Visit: Payer: Medicare HMO

## 2018-07-08 ENCOUNTER — Other Ambulatory Visit: Payer: Self-pay | Admitting: *Deleted

## 2018-07-08 DIAGNOSIS — C9001 Multiple myeloma in remission: Secondary | ICD-10-CM

## 2018-07-09 ENCOUNTER — Other Ambulatory Visit: Payer: Self-pay | Admitting: *Deleted

## 2018-07-09 ENCOUNTER — Other Ambulatory Visit: Payer: Self-pay

## 2018-07-09 DIAGNOSIS — C9001 Multiple myeloma in remission: Secondary | ICD-10-CM

## 2018-07-09 MED ORDER — LENALIDOMIDE 15 MG PO CAPS: ORAL_CAPSULE | ORAL | 0 refills | Status: DC

## 2018-07-10 ENCOUNTER — Inpatient Hospital Stay: Payer: Medicare HMO | Attending: Internal Medicine

## 2018-07-10 ENCOUNTER — Other Ambulatory Visit: Payer: Self-pay

## 2018-07-10 DIAGNOSIS — E785 Hyperlipidemia, unspecified: Secondary | ICD-10-CM | POA: Insufficient documentation

## 2018-07-10 DIAGNOSIS — K219 Gastro-esophageal reflux disease without esophagitis: Secondary | ICD-10-CM | POA: Insufficient documentation

## 2018-07-10 DIAGNOSIS — Z85850 Personal history of malignant neoplasm of thyroid: Secondary | ICD-10-CM | POA: Diagnosis not present

## 2018-07-10 DIAGNOSIS — C9001 Multiple myeloma in remission: Secondary | ICD-10-CM | POA: Diagnosis present

## 2018-07-10 DIAGNOSIS — I129 Hypertensive chronic kidney disease with stage 1 through stage 4 chronic kidney disease, or unspecified chronic kidney disease: Secondary | ICD-10-CM | POA: Diagnosis not present

## 2018-07-10 DIAGNOSIS — E1122 Type 2 diabetes mellitus with diabetic chronic kidney disease: Secondary | ICD-10-CM | POA: Diagnosis not present

## 2018-07-10 DIAGNOSIS — Z79899 Other long term (current) drug therapy: Secondary | ICD-10-CM | POA: Insufficient documentation

## 2018-07-10 DIAGNOSIS — N183 Chronic kidney disease, stage 3 (moderate): Secondary | ICD-10-CM | POA: Diagnosis not present

## 2018-07-10 DIAGNOSIS — Z7982 Long term (current) use of aspirin: Secondary | ICD-10-CM | POA: Diagnosis not present

## 2018-07-10 LAB — COMPREHENSIVE METABOLIC PANEL
ALT: 14 U/L (ref 0–44)
AST: 16 U/L (ref 15–41)
Albumin: 3.6 g/dL (ref 3.5–5.0)
Alkaline Phosphatase: 44 U/L (ref 38–126)
Anion gap: 7 (ref 5–15)
BUN: 22 mg/dL (ref 8–23)
CO2: 23 mmol/L (ref 22–32)
Calcium: 8.7 mg/dL — ABNORMAL LOW (ref 8.9–10.3)
Chloride: 109 mmol/L (ref 98–111)
Creatinine, Ser: 1.88 mg/dL — ABNORMAL HIGH (ref 0.61–1.24)
GFR calc Af Amer: 40 mL/min — ABNORMAL LOW (ref >60)
GFR calc non Af Amer: 34 mL/min — ABNORMAL LOW (ref >60)
Glucose, Bld: 96 mg/dL (ref 70–99)
Potassium: 4.9 mmol/L (ref 3.5–5.1)
Sodium: 139 mmol/L (ref 135–145)
Total Bilirubin: 0.8 mg/dL (ref 0.3–1.2)
Total Protein: 7.3 g/dL (ref 6.5–8.1)

## 2018-07-10 LAB — CBC WITH DIFFERENTIAL/PLATELET
Abs Immature Granulocytes: 0.02 10*3/uL (ref 0.00–0.07)
Basophils Absolute: 0 10*3/uL (ref 0.0–0.1)
Basophils Relative: 1 %
Eosinophils Absolute: 0.2 10*3/uL (ref 0.0–0.5)
Eosinophils Relative: 8 %
HCT: 36.8 % — ABNORMAL LOW (ref 39.0–52.0)
Hemoglobin: 11.9 g/dL — ABNORMAL LOW (ref 13.0–17.0)
Immature Granulocytes: 1 %
Lymphocytes Relative: 32 %
Lymphs Abs: 0.8 10*3/uL (ref 0.7–4.0)
MCH: 31.4 pg (ref 26.0–34.0)
MCHC: 32.3 g/dL (ref 30.0–36.0)
MCV: 97.1 fL (ref 80.0–100.0)
Monocytes Absolute: 0.4 10*3/uL (ref 0.1–1.0)
Monocytes Relative: 18 %
Neutro Abs: 0.9 10*3/uL — ABNORMAL LOW (ref 1.7–7.7)
Neutrophils Relative %: 40 %
Platelets: 124 10*3/uL — ABNORMAL LOW (ref 150–400)
RBC: 3.79 MIL/uL — ABNORMAL LOW (ref 4.22–5.81)
RDW: 14.7 % (ref 11.5–15.5)
WBC: 2.3 10*3/uL — ABNORMAL LOW (ref 4.0–10.5)
nRBC: 0 % (ref 0.0–0.2)

## 2018-07-11 LAB — KAPPA/LAMBDA LIGHT CHAINS
Kappa free light chain: 96.2 mg/L — ABNORMAL HIGH (ref 3.3–19.4)
Kappa, lambda light chain ratio: 1.91 — ABNORMAL HIGH (ref 0.26–1.65)
Lambda free light chains: 50.4 mg/L — ABNORMAL HIGH (ref 5.7–26.3)

## 2018-07-12 ENCOUNTER — Other Ambulatory Visit: Payer: Self-pay

## 2018-07-12 ENCOUNTER — Encounter: Payer: Self-pay | Admitting: Internal Medicine

## 2018-07-12 ENCOUNTER — Inpatient Hospital Stay: Payer: Medicare HMO

## 2018-07-12 ENCOUNTER — Inpatient Hospital Stay (HOSPITAL_BASED_OUTPATIENT_CLINIC_OR_DEPARTMENT_OTHER): Payer: Medicare HMO | Admitting: Internal Medicine

## 2018-07-12 VITALS — BP 139/90 | HR 56 | Temp 97.3°F | Resp 20 | Wt 244.4 lb

## 2018-07-12 DIAGNOSIS — Z7982 Long term (current) use of aspirin: Secondary | ICD-10-CM

## 2018-07-12 DIAGNOSIS — E785 Hyperlipidemia, unspecified: Secondary | ICD-10-CM

## 2018-07-12 DIAGNOSIS — Z79899 Other long term (current) drug therapy: Secondary | ICD-10-CM

## 2018-07-12 DIAGNOSIS — Z85850 Personal history of malignant neoplasm of thyroid: Secondary | ICD-10-CM

## 2018-07-12 DIAGNOSIS — I129 Hypertensive chronic kidney disease with stage 1 through stage 4 chronic kidney disease, or unspecified chronic kidney disease: Secondary | ICD-10-CM

## 2018-07-12 DIAGNOSIS — N183 Chronic kidney disease, stage 3 unspecified: Secondary | ICD-10-CM

## 2018-07-12 DIAGNOSIS — C9001 Multiple myeloma in remission: Secondary | ICD-10-CM

## 2018-07-12 DIAGNOSIS — K219 Gastro-esophageal reflux disease without esophagitis: Secondary | ICD-10-CM

## 2018-07-12 DIAGNOSIS — E1122 Type 2 diabetes mellitus with diabetic chronic kidney disease: Secondary | ICD-10-CM

## 2018-07-12 LAB — MULTIPLE MYELOMA PANEL, SERUM
Albumin SerPl Elph-Mcnc: 3.6 g/dL (ref 2.9–4.4)
Albumin/Glob SerPl: 1.2 (ref 0.7–1.7)
Alpha 1: 0.2 g/dL (ref 0.0–0.4)
Alpha2 Glob SerPl Elph-Mcnc: 0.7 g/dL (ref 0.4–1.0)
B-Globulin SerPl Elph-Mcnc: 1 g/dL (ref 0.7–1.3)
Gamma Glob SerPl Elph-Mcnc: 1.4 g/dL (ref 0.4–1.8)
Globulin, Total: 3.2 g/dL (ref 2.2–3.9)
IgA: 484 mg/dL — ABNORMAL HIGH (ref 61–437)
IgG (Immunoglobin G), Serum: 1448 mg/dL (ref 603–1613)
IgM (Immunoglobulin M), Srm: 31 mg/dL (ref 15–143)
Total Protein ELP: 6.8 g/dL (ref 6.0–8.5)

## 2018-07-12 LAB — PROTEIN / CREATININE RATIO, URINE
Creatinine, Urine: 180 mg/dL
Protein Creatinine Ratio: 0.09 mg/mg{Cre} (ref 0.00–0.15)
Total Protein, Urine: 17 mg/dL

## 2018-07-12 NOTE — Patient Instructions (Signed)
#  Do not start Revlimid until further instructed.

## 2018-07-12 NOTE — Assessment & Plan Note (Addendum)
#  Multiple myeloma-on maintenance Revlimid 15 mg 3 weeks/1 week off.  Negative for SPEP; however slowly rising kappa lambda light chain ratio=1.9.  See discussion below.  Clinically doubt if patient's renal insufficiency is caused by progression of myeloma-since myeloma parameters are not too bad.  However if inconclusive a bone marrow biopsy would be recommended.  #Hold Revlimid today/hold Zometa [see below].  White count 2.3 ANC 0.9 hemoglobin 11 platelets 124 likely secondary to Revlimid/worsening renal dysfunction.  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  Will check urine protein creat ratio.  Also check kidney ultrasound.  Refer to nephrology  #Bone modifying agent/multiple myeloma-hold Zometa; question because of renal insufficiency.  #  DISPOSITION: # HOLD Zometa today. # Urine test today; US kidney asap.  # # referral to Nephrology asap re: CKD # follow up in second week of July-MD- cbc/cmp/ MM panel; kappa/lamda light chain.

## 2018-07-12 NOTE — Progress Notes (Signed)
Referral to [facility] [place] [facility] faxed per v/o Dr. Rogue Bussing

## 2018-07-12 NOTE — Progress Notes (Signed)
[facility] OFFICE PROGRESS NOTE  Patient Care Team: Alan Hire, MD as PCP - General (Internal Medicine) Alan Brownie, MD as Consulting Physician (Dermatology)  Cancer Staging No matching staging information was found for the patient.   Oncology History    # April 2014- MULTIPLE MYELOMA [IgG- 2.2gm/dl; 40-50% plasma cell; Cyto-N; FISH- gain of chr.9, 15 & ccnd1/11q13] s/p RVD x6 [sep 2014; BMBx- ~5% plasma cells]; Maint Rev-DEX-Zometa; March 2015-Stopped Dex Cont- Rev-Zometa; July 2016- Rev 28m; NOV 4th  2016-HOLD; [facility] declined BMT.   # Re-START FEB 2017 Rev 138m3 W- on & 1 w OFF.   # Zometa q 52M  # CKD [creat ~1.4; sec MM]; DM  -------------------------------------------------------------------------   DIAGNOSIS: [2[name] MULTIPLE MYELOMA  GOALS: control  CURRENT/MOST RECENT THERAPY- REVLIMID Maintenance [Feb 2017]      Multiple myeloma in remission ([place]  10/01/2015 Initial Diagnosis    Multiple myeloma in remission ([facility]      INTERVAL HISTORY:  ThButch Otterson89.o.  male pleasant patient above history of multiple myeloma currently on Revlimid maintenance is here for follow-up.  Patient denies any nausea vomiting but denies any swelling in the legs.  No chest pain or shortness with cough.  He has been taking Revlimid 15 mg 3 weeks on 1 week off.  Denies any NSAIDs.  Review of Systems  Constitutional: Negative for chills, diaphoresis, fever, malaise/fatigue and weight loss.  HENT: Negative for nosebleeds and sore throat.   Eyes: Negative for double vision.  Respiratory: Negative for cough, hemoptysis, sputum production, shortness of breath and wheezing.   Cardiovascular: Negative for chest pain, palpitations, orthopnea and leg swelling.  Gastrointestinal: Negative for abdominal pain, blood in stool, constipation, diarrhea, heartburn, melena, nausea and vomiting.  Genitourinary: Negative for dysuria, frequency and  urgency.  Musculoskeletal: Negative for back pain and joint pain.  Skin: Negative.  Negative for itching and rash.  Neurological: Negative for dizziness, tingling, focal weakness, weakness and headaches.  Endo/Heme/Allergies: Does not bruise/bleed easily.  Psychiatric/Behavioral: Negative for depression. The patient is not nervous/anxious and does not have insomnia.       PAST MEDICAL HISTORY :  Past Medical History:  Diagnosis Date  . Diabetes mellitus without complication ([place]  . GERD (gastroesophageal reflux disease)   . Hyperlipidemia   . Hypertension   . Multiple myeloma ([facility]  . Obesity   . Thyroid cancer ([facility]  . Thyroid disease     PAST SURGICAL HISTORY :   Past Surgical History:  Procedure Laterality Date  . BONE MARROW BIOPSY  2014  . CATARACT EXTRACTION BILATERAL W/ ANTERIOR VITRECTOMY      FAMILY HISTORY :   Family History  Problem Relation Age of Onset  . Cancer Father [id number]. Diabetes Mother     SOCIAL HISTORY:   Social History   Tobacco Use  . Smoking status: Never Smoker  . Smokeless tobacco: Never Used  Substance Use Topics  . Alcohol use: No  . Drug use: No    ALLERGIES:  has No Known Allergies.  MEDICATIONS:  Current Outpatient Medications  Medication Sig Dispense Refill  . acyclovir (ZOVIRAX) 400 MG tablet 400 mg 2 (two) times daily.    . Marland KitchenmLODipine (NORVASC) 10 MG tablet Take 10 mg by mouth daily.    . Marland Kitchenspirin EC 81 MG tablet Take 81 mg by mouth daily.    . Marland Kitchentorvastatin (LIPITOR) 20 MG tablet Take 20  mg by mouth daily.    . baclofen (LIORESAL) 10 MG tablet Take 0.5 tablets (5 mg total) by mouth 3 (three) times daily. 30 each 0  . cloNIDine (CATAPRES) 0.1 MG tablet Take 0.1 mg by mouth 2 (two) times daily.    . Cyanocobalamin (RA VITAMIN B-12 TR) 1000 MCG TBCR Take by mouth.    . lenalidomide (REVLIMID) 15 MG capsule Take 1 capsule (15 mg total) by mouth daily for 3 weeks, then hold for 1 week off 21 capsule 0  . levothyroxine  (SYNTHROID) 137 MCG tablet Take 1 tablet by mouth daily.     [name] losartan (COZAAR) 100 MG tablet Take 100 mg by mouth daily.    [name] DELICA LANCETS 45Y MISC Inject 1 Lancet as directed once daily.    . pantoprazole (PROTONIX) 40 MG tablet TAKE 1 TABLET BY MOUTH EVERY DAY 60 tablet 3  . pioglitazone (ACTOS) 30 MG tablet Take 30 mg by mouth daily.    . potassium chloride SA (K-DUR,KLOR-CON) 20 MEQ tablet Take 20 mEq by mouth 2 (two) times daily.   0  . quinapril (ACCUPRIL) 40 MG tablet Take 40 mg by mouth daily.     No current facility-administered medications for this visit.    Facility-Administered Medications Ordered in Other Visits  Medication Dose Route Frequency Provider Last Rate Last Dose  . 0.9 %  sodium chloride infusion   Intravenous Continuous Cammie Sickle, MD   Stopped at 12/11/14 1528    PHYSICAL EXAMINATION: ECOG PERFORMANCE STATUS: 0 - Asymptomatic  BP 139/90 (BP Location: Right Arm, Patient Position: Sitting, Cuff Size: Large)   Pulse (!) 56   Temp (!) 97.3 F (36.3 C) (Tympanic)   Resp 20   Wt 244 lb 6.4 oz (110.9 kg)   BMI 35.07 kg/m   Filed Weights   07/12/18 1318  Weight: 244 lb 6.4 oz (110.9 kg)    Physical Exam  Constitutional: He is oriented to person, place, and time and well-developed, well-nourished, and in no distress.  HENT:  Head: Normocephalic and atraumatic.  Mouth/Throat: Oropharynx is clear and moist. No oropharyngeal exudate.  Eyes: Pupils are equal, round, and reactive to light.  Neck: Normal range of motion. Neck supple.  Cardiovascular: Normal rate and regular rhythm.  Pulmonary/Chest: No respiratory distress. He has no wheezes.  Abdominal: Soft. Bowel sounds are normal. He exhibits no distension and no mass. There is no abdominal tenderness. There is no rebound and no guarding.  Musculoskeletal: Normal range of motion.        General: No tenderness or edema.  Neurological: He is alert and oriented to person, place, and  time.  Skin: Skin is warm.  Psychiatric: Affect normal.      LABORATORY DATA:  I have reviewed the data as listed    Component Value Date/Time   NA 139 07/10/2018 1316   NA 139 05/01/2014 1322   K 4.9 07/10/2018 1316   K 4.4 05/01/2014 1322   CL 109 07/10/2018 1316   CL 107 05/01/2014 1322   CO2 23 07/10/2018 1316   CO2 27 05/01/2014 1322   GLUCOSE 96 07/10/2018 1316   GLUCOSE 102 (H) 05/01/2014 1322   BUN 22 07/10/2018 1316   BUN 16 05/01/2014 1322   CREATININE 1.88 (H) 07/10/2018 1316   CREATININE 1.48 (H) 05/29/2014 0949   CALCIUM 8.7 (L) 07/10/2018 1316   CALCIUM 9.1 05/01/2014 1322   PROT 7.3 07/10/2018 1316   PROT 7.5 05/01/2014 1322  ALBUMIN 3.6 07/10/2018 1316   ALBUMIN 3.6 05/01/2014 1322   AST 16 07/10/2018 1316   AST 17 05/01/2014 1322   ALT 14 07/10/2018 1316   ALT 13 (L) 05/01/2014 1322   ALKPHOS 44 07/10/2018 1316   ALKPHOS 47 05/01/2014 1322   BILITOT 0.8 07/10/2018 1316   BILITOT 0.6 05/01/2014 1322   GFRNONAA 34 (L) 07/10/2018 1316   GFRNONAA 47 (L) 05/29/2014 0949   GFRAA 40 (L) 07/10/2018 1316   GFRAA 55 (L) 05/29/2014 0949    No results found for: SPEP, UPEP  Lab Results  Component Value Date   WBC 2.3 (L) 07/10/2018   NEUTROABS 0.9 (L) 07/10/2018   HGB 11.9 (L) 07/10/2018   HCT 36.8 (L) 07/10/2018   MCV 97.1 07/10/2018   PLT 124 (L) 07/10/2018      Chemistry      Component Value Date/Time   NA 139 07/10/2018 1316   NA 139 05/01/2014 1322   K 4.9 07/10/2018 1316   K 4.4 05/01/2014 1322   CL 109 07/10/2018 1316   CL 107 05/01/2014 1322   CO2 23 07/10/2018 1316   CO2 27 05/01/2014 1322   BUN 22 07/10/2018 1316   BUN 16 05/01/2014 1322   CREATININE 1.88 (H) 07/10/2018 1316   CREATININE 1.48 (H) 05/29/2014 0949      Component Value Date/Time   CALCIUM 8.7 (L) 07/10/2018 1316   CALCIUM 9.1 05/01/2014 1322   ALKPHOS 44 07/10/2018 1316   ALKPHOS 47 05/01/2014 1322   AST 16 07/10/2018 1316   AST 17 05/01/2014 1322   ALT 14  07/10/2018 1316   ALT 13 (L) 05/01/2014 1322   BILITOT 0.8 07/10/2018 1316   BILITOT 0.6 05/01/2014 1322       RADIOGRAPHIC STUDIES: I have personally reviewed the radiological images as listed and agreed with the findings in the report. No results found.   ASSESSMENT & PLAN:  Multiple myeloma in remission ([place]) #Multiple myeloma-on maintenance Revlimid 15 mg 3 weeks/1 week off.  Negative for SPEP; however slowly rising kappa lambda light chain ratio=1.9.  See discussion below.  Clinically doubt if patient's renal insufficiency is caused by progression of myeloma-since myeloma parameters are not too bad.  However if inconclusive a bone marrow biopsy would be recommended.  #Hold Revlimid today/hold Zometa [see below].  White count 2.3 ANC 0.9 hemoglobin 11 platelets 124 likely secondary to Revlimid/worsening renal dysfunction.  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  Will check urine protein creat ratio.  Also check kidney ultrasound.  Refer to nephrology  #Bone modifying agent/multiple myeloma-hold Zometa; question because of renal insufficiency.  #  DISPOSITION: # HOLD Zometa today. # Urine test today; US kidney asap.  # # referral to Nephrology asap re: CKD # follow up in second week of July-MD- cbc/cmp/ MM panel; kappa/lamda light chain.    Orders Placed This Encounter  Procedures  . US RENAL    Standing Status:   Future    Standing Expiration Date:   09/11/2019    Order Specific Question:   Reason for Exam (SYMPTOM  OR DIAGNOSIS REQUIRED)    Answer:   renal failure    Order Specific Question:   Preferred imaging location?    Answer:   [facility]  . Protein / creatinine ratio, urine    Standing Status:   Future    Number of Occurrences:   1    Standing Expiration Date:   07/12/2019  . CBC with Differential  Standing Status:   Future    Standing Expiration Date:   07/12/2019  . Comprehensive metabolic panel    Standing Status:   Future     Standing Expiration Date:   07/12/2019  . Multiple Myeloma Panel (SPEP&IFE w/QIG)    Standing Status:   Future    Standing Expiration Date:   07/12/2019  . Kappa/lambda light chains    Standing Status:   Future    Standing Expiration Date:   07/12/2019  . Ambulatory referral to Nephrology    Referral Priority:   Routine    Referral Type:   Consultation    Referral Reason:   Specialty Services Required    Requested Specialty:   Nephrology    Number of Visits Requested:   1   All questions were answered. The patient knows to call the clinic with any problems, questions or concerns.      Cammie Sickle, MD 07/12/2018 4:46 PM

## 2018-07-16 ENCOUNTER — Ambulatory Visit
Admission: RE | Admit: 2018-07-16 | Discharge: 2018-07-16 | Disposition: A | Discharge status: Home / Self Care | Payer: Medicare HMO | Source: Ambulatory Visit | Attending: Internal Medicine | Admitting: Internal Medicine

## 2018-07-16 ENCOUNTER — Other Ambulatory Visit: Payer: Self-pay

## 2018-07-16 DIAGNOSIS — N183 Chronic kidney disease, stage 3 unspecified: Secondary | ICD-10-CM

## 2018-07-16 DIAGNOSIS — C9001 Multiple myeloma in remission: Secondary | ICD-10-CM | POA: Diagnosis present

## 2018-07-22 ENCOUNTER — Telehealth: Payer: Self-pay | Admitting: *Deleted

## 2018-07-22 DIAGNOSIS — C9001 Multiple myeloma in remission: Secondary | ICD-10-CM

## 2018-07-22 NOTE — Telephone Encounter (Signed)
-----   Message from Cammie Sickle, MD sent at 07/22/2018  8:21 AM EDT ----- Nira Conn- I have sent him a message on my chart regarding this kidney ultrasound.   please check with patient regarding appointment with nephrology.  Please order cbc/BMP in 1 week from now.   Follow-up with me as planned.  Thanks GB

## 2018-07-29 ENCOUNTER — Inpatient Hospital Stay: Payer: Medicare HMO

## 2018-07-29 ENCOUNTER — Other Ambulatory Visit: Payer: Self-pay

## 2018-07-29 DIAGNOSIS — C9001 Multiple myeloma in remission: Secondary | ICD-10-CM

## 2018-07-29 LAB — CBC WITH DIFFERENTIAL/PLATELET
Abs Immature Granulocytes: 0.01 10*3/uL (ref 0.00–0.07)
Basophils Absolute: 0 10*3/uL (ref 0.0–0.1)
Basophils Relative: 0 %
Eosinophils Absolute: 0.1 10*3/uL (ref 0.0–0.5)
Eosinophils Relative: 2 %
HCT: 37.8 % — ABNORMAL LOW (ref 39.0–52.0)
Hemoglobin: 12.3 g/dL — ABNORMAL LOW (ref 13.0–17.0)
Immature Granulocytes: 0 %
Lymphocytes Relative: 28 %
Lymphs Abs: 0.6 10*3/uL — ABNORMAL LOW (ref 0.7–4.0)
MCH: 31.1 pg (ref 26.0–34.0)
MCHC: 32.5 g/dL (ref 30.0–36.0)
MCV: 95.7 fL (ref 80.0–100.0)
Monocytes Absolute: 0.3 10*3/uL (ref 0.1–1.0)
Monocytes Relative: 12 %
Neutro Abs: 1.3 10*3/uL — ABNORMAL LOW (ref 1.7–7.7)
Neutrophils Relative %: 58 %
Platelets: 170 10*3/uL (ref 150–400)
RBC: 3.95 MIL/uL — ABNORMAL LOW (ref 4.22–5.81)
RDW: 14.2 % (ref 11.5–15.5)
WBC: 2.3 10*3/uL — ABNORMAL LOW (ref 4.0–10.5)
nRBC: 0 % (ref 0.0–0.2)

## 2018-07-29 LAB — BASIC METABOLIC PANEL
Anion gap: 5 (ref 5–15)
BUN: 25 mg/dL — ABNORMAL HIGH (ref 8–23)
CO2: 25 mmol/L (ref 22–32)
Calcium: 8.6 mg/dL — ABNORMAL LOW (ref 8.9–10.3)
Chloride: 105 mmol/L (ref 98–111)
Creatinine, Ser: 1.68 mg/dL — ABNORMAL HIGH (ref 0.61–1.24)
GFR calc Af Amer: 46 mL/min — ABNORMAL LOW (ref >60)
GFR calc non Af Amer: 39 mL/min — ABNORMAL LOW (ref >60)
Glucose, Bld: 116 mg/dL — ABNORMAL HIGH (ref 70–99)
Potassium: 4.9 mmol/L (ref 3.5–5.1)
Sodium: 135 mmol/L (ref 135–145)

## 2018-08-02 ENCOUNTER — Other Ambulatory Visit: Payer: Self-pay | Admitting: *Deleted

## 2018-08-02 DIAGNOSIS — C9001 Multiple myeloma in remission: Secondary | ICD-10-CM

## 2018-08-02 NOTE — Telephone Encounter (Signed)
Currently on hold per Dr. Rogue Bussing due to kidney function.

## 2018-08-05 ENCOUNTER — Other Ambulatory Visit: Payer: Self-pay | Admitting: *Deleted

## 2018-08-05 DIAGNOSIS — C9001 Multiple myeloma in remission: Secondary | ICD-10-CM

## 2018-08-05 NOTE — Telephone Encounter (Signed)
Patient's revlimid is currently on hold due to nephrology concerns.

## 2018-08-07 ENCOUNTER — Other Ambulatory Visit: Payer: Self-pay | Admitting: *Deleted

## 2018-08-07 DIAGNOSIS — C9001 Multiple myeloma in remission: Secondary | ICD-10-CM

## 2018-08-15 ENCOUNTER — Other Ambulatory Visit: Payer: Self-pay

## 2018-08-16 ENCOUNTER — Inpatient Hospital Stay (HOSPITAL_BASED_OUTPATIENT_CLINIC_OR_DEPARTMENT_OTHER): Payer: Medicare HMO | Admitting: Internal Medicine

## 2018-08-16 ENCOUNTER — Inpatient Hospital Stay: Payer: Medicare HMO | Attending: Internal Medicine

## 2018-08-16 ENCOUNTER — Other Ambulatory Visit: Payer: Self-pay

## 2018-08-16 DIAGNOSIS — E785 Hyperlipidemia, unspecified: Secondary | ICD-10-CM | POA: Diagnosis not present

## 2018-08-16 DIAGNOSIS — E1122 Type 2 diabetes mellitus with diabetic chronic kidney disease: Secondary | ICD-10-CM | POA: Insufficient documentation

## 2018-08-16 DIAGNOSIS — C9001 Multiple myeloma in remission: Secondary | ICD-10-CM

## 2018-08-16 DIAGNOSIS — N189 Chronic kidney disease, unspecified: Secondary | ICD-10-CM | POA: Diagnosis not present

## 2018-08-16 DIAGNOSIS — K219 Gastro-esophageal reflux disease without esophagitis: Secondary | ICD-10-CM | POA: Insufficient documentation

## 2018-08-16 DIAGNOSIS — Z79899 Other long term (current) drug therapy: Secondary | ICD-10-CM

## 2018-08-16 DIAGNOSIS — I129 Hypertensive chronic kidney disease with stage 1 through stage 4 chronic kidney disease, or unspecified chronic kidney disease: Secondary | ICD-10-CM | POA: Diagnosis not present

## 2018-08-16 DIAGNOSIS — Z7982 Long term (current) use of aspirin: Secondary | ICD-10-CM | POA: Diagnosis not present

## 2018-08-16 DIAGNOSIS — N183 Chronic kidney disease, stage 3 unspecified: Secondary | ICD-10-CM

## 2018-08-16 LAB — COMPREHENSIVE METABOLIC PANEL
ALT: 11 U/L (ref 0–44)
AST: 17 U/L (ref 15–41)
Albumin: 3.6 g/dL (ref 3.5–5.0)
Alkaline Phosphatase: 47 U/L (ref 38–126)
Anion gap: 6 (ref 5–15)
BUN: 28 mg/dL — ABNORMAL HIGH (ref 8–23)
CO2: 24 mmol/L (ref 22–32)
Calcium: 8.9 mg/dL (ref 8.9–10.3)
Chloride: 109 mmol/L (ref 98–111)
Creatinine, Ser: 1.64 mg/dL — ABNORMAL HIGH (ref 0.61–1.24)
GFR calc Af Amer: 47 mL/min — ABNORMAL LOW (ref >60)
GFR calc non Af Amer: 41 mL/min — ABNORMAL LOW (ref >60)
Glucose, Bld: 122 mg/dL — ABNORMAL HIGH (ref 70–99)
Potassium: 4.9 mmol/L (ref 3.5–5.1)
Sodium: 139 mmol/L (ref 135–145)
Total Bilirubin: 0.5 mg/dL (ref 0.3–1.2)
Total Protein: 7.2 g/dL (ref 6.5–8.1)

## 2018-08-16 LAB — CBC WITH DIFFERENTIAL/PLATELET
Abs Immature Granulocytes: 0.01 10*3/uL (ref 0.00–0.07)
Basophils Absolute: 0 10*3/uL (ref 0.0–0.1)
Basophils Relative: 0 %
Eosinophils Absolute: 0.1 10*3/uL (ref 0.0–0.5)
Eosinophils Relative: 3 %
HCT: 37.1 % — ABNORMAL LOW (ref 39.0–52.0)
Hemoglobin: 11.7 g/dL — ABNORMAL LOW (ref 13.0–17.0)
Immature Granulocytes: 0 %
Lymphocytes Relative: 23 %
Lymphs Abs: 0.7 10*3/uL (ref 0.7–4.0)
MCH: 30.6 pg (ref 26.0–34.0)
MCHC: 31.5 g/dL (ref 30.0–36.0)
MCV: 97.1 fL (ref 80.0–100.0)
Monocytes Absolute: 0.5 10*3/uL (ref 0.1–1.0)
Monocytes Relative: 16 %
Neutro Abs: 1.7 10*3/uL (ref 1.7–7.7)
Neutrophils Relative %: 58 %
Platelets: 172 10*3/uL (ref 150–400)
RBC: 3.82 MIL/uL — ABNORMAL LOW (ref 4.22–5.81)
RDW: 15.1 % (ref 11.5–15.5)
WBC: 3 10*3/uL — ABNORMAL LOW (ref 4.0–10.5)
nRBC: 0 % (ref 0.0–0.2)

## 2018-08-16 NOTE — Progress Notes (Signed)
Patient does not offer any problems today. Patient evaluated by [facility] on 07/26/18.  Has a lab recheck next week with MD follow in 2 weeks.  Revlimid still on hold.

## 2018-08-16 NOTE — Progress Notes (Signed)
[place] [place] OFFICE PROGRESS NOTE  Patient Care Team: Alan Hire, MD as PCP - General (Internal Medicine) Alan Brownie, MD as Consulting Physician (Dermatology)  Cancer Staging No matching staging information was found for the patient.   Oncology History Overview Note   # April 2014- MULTIPLE MYELOMA [IgG- 2.2gm/dl; 40-50% plasma cell; Cyto-N; FISH- gain of chr.9, 15 & ccnd1/11q13] s/p RVD x6 [sep 2014; BMBx- ~5% plasma cells]; Maint Rev-DEX-Zometa; March 2015-Stopped Dex Cont- Rev-Zometa; July 2016- Rev 59m; NOV 4th  2016[facility]HOLD; [facility] declined BMT.   # Re-START FEB 2017 Rev 126m3 W- on & 1 w OFF.   # Zometa q 98M  # CKD [creat ~1.4; sec MM]; DM  -------------------------------------------------------------------------   DIAGNOSIS: [2Darrin.Alexander MULTIPLE MYELOMA  GOALS: control  CURRENT/MOST RECENT THERAPY- REVLIMID Maintenance [Feb 2017]    Multiple myeloma in remission (Alan Alexander 10/01/2015 Initial Diagnosis   Multiple myeloma in remission (Alan Alexander      INTERVAL HISTORY:  Alan Alexander.o.  male pleasant patient above history of multiple myeloma most recently on Revlimid maintenance is here for follow-up.  Patient's Revlimid was held approximately a month ago because of worsening renal function-nadir 1.8.  Baseline around 1.4-1.5.  In the interim he has been evaluated by nephrology.  Patient denies any nausea vomiting preoperative good pain no diarrhea.  No cough no shortness of breath.  He is not taking NSAIDs.  Review of Systems  Constitutional: Negative for chills, diaphoresis, fever, malaise/fatigue and weight loss.  HENT: Negative for nosebleeds and sore throat.   Eyes: Negative for double vision.  Respiratory: Negative for cough, hemoptysis, sputum production, shortness of breath and wheezing.   Cardiovascular: Negative for chest pain, palpitations, orthopnea and leg swelling.  Gastrointestinal: Negative for abdominal  pain, blood in stool, constipation, diarrhea, heartburn, melena, nausea and vomiting.  Genitourinary: Negative for dysuria, frequency and urgency.  Musculoskeletal: Negative for back pain and joint pain.  Skin: Negative.  Negative for itching and rash.  Neurological: Negative for dizziness, tingling, focal weakness, weakness and headaches.  Endo/Heme/Allergies: Does not bruise/bleed easily.  Psychiatric/Behavioral: Negative for depression. The patient is not nervous/anxious and does not have insomnia.       PAST MEDICAL HISTORY :  Past Medical History:  Diagnosis Date  . Diabetes mellitus without complication ([place]  . GERD (gastroesophageal reflux disease)   . Hyperlipidemia   . Hypertension   . Multiple myeloma ([place]  . Obesity   . Thyroid cancer (HC[place]  . Thyroid disease     PAST SURGICAL HISTORY :   Past Surgical History:  Procedure Laterality Date  . BONE MARROW BIOPSY  2014  . CATARACT EXTRACTION BILATERAL W/ ANTERIOR VITRECTOMY      FAMILY HISTORY :   Family History  Problem Relation Age of Onset  . Cancer Father [id number]. Diabetes Mother     SOCIAL HISTORY:   Social History   Tobacco Use  . Smoking status: Never Smoker  . Smokeless tobacco: Never Used  Substance Use Topics  . Alcohol use: No  . Drug use: No    ALLERGIES:  has No Known Allergies.  MEDICATIONS:  Current Outpatient Medications  Medication Sig Dispense Refill  . acyclovir (ZOVIRAX) 400 MG tablet 400 mg 2 (two) times daily.    . Marland KitchenmLODipine (NORVASC) 10 MG tablet Take 10 mg by mouth daily.    . Marland Kitchenspirin EC 81 MG tablet Take 81 mg by mouth  daily.    . atorvastatin (LIPITOR) 20 MG tablet Take 20 mg by mouth daily.    . Cyanocobalamin (RA VITAMIN B-12 TR) 1000 MCG TBCR Take by mouth.    . levothyroxine (SYNTHROID) 137 MCG tablet Take 1 tablet by mouth daily.     [name] losartan (COZAAR) 100 MG tablet Take 100 mg by mouth daily.    [name] DELICA LANCETS 29H MISC Inject 1 Lancet as directed once  daily.    . pantoprazole (PROTONIX) 40 MG tablet TAKE 1 TABLET BY MOUTH EVERY DAY 60 tablet 3  . pioglitazone (ACTOS) 30 MG tablet Take 30 mg by mouth daily.    . potassium chloride SA (K-DUR,KLOR-CON) 20 MEQ tablet Take 20 mEq by mouth 2 (two) times daily.   0  . quinapril (ACCUPRIL) 40 MG tablet Take 40 mg by mouth daily.    . baclofen (LIORESAL) 10 MG tablet Take 0.5 tablets (5 mg total) by mouth 3 (three) times daily. (Patient not taking: Reported on 08/16/2018) 30 each 0  . cloNIDine (CATAPRES) 0.1 MG tablet Take 0.1 mg by mouth 2 (two) times daily.    [name] lenalidomide (REVLIMID) 15 MG capsule Take 1 capsule (15 mg total) by mouth daily for 3 weeks, then hold for 1 week off (Patient not taking: Reported on 08/16/2018) 21 capsule 0   No current facility-administered medications for this visit.    Facility-Administered Medications Ordered in Other Visits  Medication Dose Route Frequency Provider Last Rate Last Dose  . 0.9 %  sodium chloride infusion   Intravenous Continuous Cammie Sickle, MD   Stopped at 12/11/14 1528    PHYSICAL EXAMINATION: ECOG PERFORMANCE STATUS: 0 - Asymptomatic  BP (!) 153/68   Pulse (!) 55   Temp (!) 96.2 F (35.7 C)   Resp 18   Wt 245 lb (111.1 kg)   BMI 35.15 kg/m   Filed Weights   08/16/18 1335  Weight: 245 lb (111.1 kg)    Physical Exam  Constitutional: He is oriented to person, place, and time and well-developed, well-nourished, and in no distress.  HENT:  Head: Normocephalic and atraumatic.  Mouth/Throat: Oropharynx is clear and moist. No oropharyngeal exudate.  Eyes: Pupils are equal, round, and reactive to light.  Neck: Normal range of motion. Neck supple.  Cardiovascular: Normal rate and regular rhythm.  Pulmonary/Chest: No respiratory distress. He has no wheezes.  Abdominal: Soft. Bowel sounds are normal. He exhibits no distension and no mass. There is no abdominal tenderness. There is no rebound and no guarding.  Musculoskeletal:  Normal range of motion.        General: No tenderness or edema.  Neurological: He is alert and oriented to person, place, and time.  Skin: Skin is warm.  Psychiatric: Affect normal.      LABORATORY DATA:  I have reviewed the data as listed    Component Value Date/Time   NA 139 08/16/2018 1321   NA 139 05/01/2014 1322   K 4.9 08/16/2018 1321   K 4.4 05/01/2014 1322   CL 109 08/16/2018 1321   CL 107 05/01/2014 1322   CO2 24 08/16/2018 1321   CO2 27 05/01/2014 1322   GLUCOSE 122 (H) 08/16/2018 1321   GLUCOSE 102 (H) 05/01/2014 1322   BUN 28 (H) 08/16/2018 1321   BUN 16 05/01/2014 1322   CREATININE 1.64 (H) 08/16/2018 1321   CREATININE 1.48 (H) 05/29/2014 0949   CALCIUM 8.9 08/16/2018 1321   CALCIUM 9.1 05/01/2014 1322  PROT 7.2 08/16/2018 1321   PROT 7.5 05/01/2014 1322   ALBUMIN 3.6 08/16/2018 1321   ALBUMIN 3.6 05/01/2014 1322   AST 17 08/16/2018 1321   AST 17 05/01/2014 1322   ALT 11 08/16/2018 1321   ALT 13 (L) 05/01/2014 1322   ALKPHOS 47 08/16/2018 1321   ALKPHOS 47 05/01/2014 1322   BILITOT 0.5 08/16/2018 1321   BILITOT 0.6 05/01/2014 1322   GFRNONAA 41 (L) 08/16/2018 1321   GFRNONAA 47 (L) 05/29/2014 0949   GFRAA 47 (L) 08/16/2018 1321   GFRAA 55 (L) 05/29/2014 0949    No results found for: SPEP, UPEP  Lab Results  Component Value Date   WBC 3.0 (L) 08/16/2018   NEUTROABS 1.7 08/16/2018   HGB 11.7 (L) 08/16/2018   HCT 37.1 (L) 08/16/2018   MCV 97.1 08/16/2018   PLT 172 08/16/2018      Chemistry      Component Value Date/Time   NA 139 08/16/2018 1321   NA 139 05/01/2014 1322   K 4.9 08/16/2018 1321   K 4.4 05/01/2014 1322   CL 109 08/16/2018 1321   CL 107 05/01/2014 1322   CO2 24 08/16/2018 1321   CO2 27 05/01/2014 1322   BUN 28 (H) 08/16/2018 1321   BUN 16 05/01/2014 1322   CREATININE 1.64 (H) 08/16/2018 1321   CREATININE 1.48 (H) 05/29/2014 0949      Component Value Date/Time   CALCIUM 8.9 08/16/2018 1321   CALCIUM 9.1 05/01/2014  1322   ALKPHOS 47 08/16/2018 1321   ALKPHOS 47 05/01/2014 1322   AST 17 08/16/2018 1321   AST 17 05/01/2014 1322   ALT 11 08/16/2018 1321   ALT 13 (L) 05/01/2014 1322   BILITOT 0.5 08/16/2018 1321   BILITOT 0.6 05/01/2014 1322       RADIOGRAPHIC STUDIES: I have personally reviewed the radiological images as listed and agreed with the findings in the report. No results found.   ASSESSMENT & PLAN:  Multiple myeloma in remission ([place]) #Multiple myeloma-most recently on maintenance Revlimid 15 mg 3 weeks/1 week off.  June 2020 negative for SPEP; however slowly rising kappa lambda light chain ratio=1.9.    # Continue to hold Revlimid at this time given the renal dysfunction [creatinine 1.68 today-see below].  Await myeloma panel from today.  If still abnormal would recommend 24-hour urine collection.  #CKD worsening GFR 40; ultrasound kidneys negative; status post evaluation with nephrology.  Await repeat evaluation with Dr. Candiss Norse in couple of weeks.  #Bone modifying agent/multiple myeloma-continue hold Zometa.  #  DISPOSITION: # HOLD Zometa today. # follow up in 1 month-MD- cbc/cmp; possible Zometa- Dr.B   No orders of the defined types were placed in this encounter.  All questions were answered. The patient knows to call the clinic with any problems, questions or concerns.      Cammie Sickle, MD 08/16/2018 2:09 PM

## 2018-08-16 NOTE — Assessment & Plan Note (Addendum)
#  Multiple myeloma-most recently on maintenance Revlimid 15 mg 3 weeks/1 week off.  June 2020 negative for SPEP; however slowly rising kappa lambda light chain ratio=1.9.    # Continue to hold Revlimid at this time given the renal dysfunction [creatinine 1.68 today-see below].  Await myeloma panel from today.  If still abnormal would recommend 24-hour urine collection.  #CKD worsening GFR 40; ultrasound kidneys negative; status post evaluation with nephrology.  Await repeat evaluation with Dr. Candiss Norse in couple of weeks.  #Bone modifying agent/multiple myeloma-continue hold Zometa.  #  DISPOSITION: # HOLD Zometa today. # follow up in 1 month-MD- cbc/cmp; possible Zometa- Dr.B

## 2018-08-19 LAB — KAPPA/LAMBDA LIGHT CHAINS
Kappa free light chain: 48.2 mg/L — ABNORMAL HIGH (ref 3.3–19.4)
Kappa, lambda light chain ratio: 1.64 (ref 0.26–1.65)
Lambda free light chains: 29.4 mg/L — ABNORMAL HIGH (ref 5.7–26.3)

## 2018-08-20 LAB — MULTIPLE MYELOMA PANEL, SERUM
Albumin SerPl Elph-Mcnc: 3.6 g/dL (ref 2.9–4.4)
Albumin/Glob SerPl: 1.2 (ref 0.7–1.7)
Alpha 1: 0.2 g/dL (ref 0.0–0.4)
Alpha2 Glob SerPl Elph-Mcnc: 0.7 g/dL (ref 0.4–1.0)
B-Globulin SerPl Elph-Mcnc: 1 g/dL (ref 0.7–1.3)
Gamma Glob SerPl Elph-Mcnc: 1.3 g/dL (ref 0.4–1.8)
Globulin, Total: 3.1 g/dL (ref 2.2–3.9)
IgA: 446 mg/dL — ABNORMAL HIGH (ref 61–437)
IgG (Immunoglobin G), Serum: 1424 mg/dL (ref 603–1613)
IgM (Immunoglobulin M), Srm: 32 mg/dL (ref 15–143)
Total Protein ELP: 6.7 g/dL (ref 6.0–8.5)

## 2018-09-12 ENCOUNTER — Other Ambulatory Visit: Payer: Self-pay | Admitting: *Deleted

## 2018-09-12 DIAGNOSIS — C9001 Multiple myeloma in remission: Secondary | ICD-10-CM

## 2018-09-13 ENCOUNTER — Inpatient Hospital Stay: Payer: Medicare HMO

## 2018-09-13 ENCOUNTER — Inpatient Hospital Stay: Payer: Medicare HMO | Attending: Internal Medicine

## 2018-09-13 ENCOUNTER — Other Ambulatory Visit: Payer: Self-pay

## 2018-09-13 ENCOUNTER — Inpatient Hospital Stay (HOSPITAL_BASED_OUTPATIENT_CLINIC_OR_DEPARTMENT_OTHER): Payer: Medicare HMO | Admitting: Internal Medicine

## 2018-09-13 DIAGNOSIS — C9001 Multiple myeloma in remission: Secondary | ICD-10-CM

## 2018-09-13 DIAGNOSIS — Z79899 Other long term (current) drug therapy: Secondary | ICD-10-CM | POA: Insufficient documentation

## 2018-09-13 DIAGNOSIS — Z7982 Long term (current) use of aspirin: Secondary | ICD-10-CM | POA: Insufficient documentation

## 2018-09-13 DIAGNOSIS — I129 Hypertensive chronic kidney disease with stage 1 through stage 4 chronic kidney disease, or unspecified chronic kidney disease: Secondary | ICD-10-CM | POA: Insufficient documentation

## 2018-09-13 DIAGNOSIS — E1122 Type 2 diabetes mellitus with diabetic chronic kidney disease: Secondary | ICD-10-CM | POA: Diagnosis not present

## 2018-09-13 DIAGNOSIS — Z7984 Long term (current) use of oral hypoglycemic drugs: Secondary | ICD-10-CM | POA: Insufficient documentation

## 2018-09-13 DIAGNOSIS — K219 Gastro-esophageal reflux disease without esophagitis: Secondary | ICD-10-CM | POA: Insufficient documentation

## 2018-09-13 DIAGNOSIS — Z85850 Personal history of malignant neoplasm of thyroid: Secondary | ICD-10-CM | POA: Diagnosis not present

## 2018-09-13 DIAGNOSIS — N189 Chronic kidney disease, unspecified: Secondary | ICD-10-CM | POA: Insufficient documentation

## 2018-09-13 DIAGNOSIS — E785 Hyperlipidemia, unspecified: Secondary | ICD-10-CM | POA: Diagnosis not present

## 2018-09-13 LAB — CBC WITH DIFFERENTIAL/PLATELET
Abs Immature Granulocytes: 0.01 10*3/uL (ref 0.00–0.07)
Basophils Absolute: 0 10*3/uL (ref 0.0–0.1)
Basophils Relative: 0 %
Eosinophils Absolute: 0.1 10*3/uL (ref 0.0–0.5)
Eosinophils Relative: 3 %
HCT: 37 % — ABNORMAL LOW (ref 39.0–52.0)
Hemoglobin: 12.3 g/dL — ABNORMAL LOW (ref 13.0–17.0)
Immature Granulocytes: 0 %
Lymphocytes Relative: 22 %
Lymphs Abs: 0.7 10*3/uL (ref 0.7–4.0)
MCH: 31.9 pg (ref 26.0–34.0)
MCHC: 33.2 g/dL (ref 30.0–36.0)
MCV: 95.9 fL (ref 80.0–100.0)
Monocytes Absolute: 0.6 10*3/uL (ref 0.1–1.0)
Monocytes Relative: 18 %
Neutro Abs: 1.8 10*3/uL (ref 1.7–7.7)
Neutrophils Relative %: 57 %
Platelets: 188 10*3/uL (ref 150–400)
RBC: 3.86 MIL/uL — ABNORMAL LOW (ref 4.22–5.81)
RDW: 14.9 % (ref 11.5–15.5)
WBC: 3.2 10*3/uL — ABNORMAL LOW (ref 4.0–10.5)
nRBC: 0 % (ref 0.0–0.2)

## 2018-09-13 LAB — COMPREHENSIVE METABOLIC PANEL
ALT: 12 U/L (ref 0–44)
AST: 16 U/L (ref 15–41)
Albumin: 3.8 g/dL (ref 3.5–5.0)
Alkaline Phosphatase: 46 U/L (ref 38–126)
Anion gap: 6 (ref 5–15)
BUN: 27 mg/dL — ABNORMAL HIGH (ref 8–23)
CO2: 25 mmol/L (ref 22–32)
Calcium: 9.2 mg/dL (ref 8.9–10.3)
Chloride: 107 mmol/L (ref 98–111)
Creatinine, Ser: 1.7 mg/dL — ABNORMAL HIGH (ref 0.61–1.24)
GFR calc Af Amer: 45 mL/min — ABNORMAL LOW (ref >60)
GFR calc non Af Amer: 39 mL/min — ABNORMAL LOW (ref >60)
Glucose, Bld: 104 mg/dL — ABNORMAL HIGH (ref 70–99)
Potassium: 5.3 mmol/L — ABNORMAL HIGH (ref 3.5–5.1)
Sodium: 138 mmol/L (ref 135–145)
Total Bilirubin: 0.6 mg/dL (ref 0.3–1.2)
Total Protein: 7.7 g/dL (ref 6.5–8.1)

## 2018-09-13 NOTE — Progress Notes (Signed)
[place] OFFICE PROGRESS NOTE  Patient Care Team: Baxter Hire, MD as PCP - General (Internal Medicine) Druscilla Brownie, MD as Consulting Physician (Dermatology)  Cancer Staging No matching staging information was found for the patient.   Oncology History Overview Note   # April 2014- MULTIPLE MYELOMA [IgG- 2.2gm/dl; 40-50% plasma cell; Cyto-N; FISH- gain of chr.9, 15 & ccnd1/11q13] s/p RVD x6 [sep 2014; BMBx- ~5% plasma cells]; Maint Rev-DEX-Zometa; March 2015-Stopped Dex Cont- Rev-Zometa; July 2016- Rev 58m; NOV 4th  2016-HOLD; [facility] declined BMT.   # Re-START FEB 2017 Rev 167m3 W- on & 1 w OFF.   # Zometa q 18M  # CKD [creat ~1.4; sec MM]; DM  -------------------------------------------------------------------------   DIAGNOSIS: [2[name] MULTIPLE MYELOMA  GOALS: control  CURRENT/MOST RECENT THERAPY- REVLIMID Maintenance [Feb 2017]    Multiple myeloma in remission ([facility] 10/01/2015 Initial Diagnosis   Multiple myeloma in remission ([facility]      INTERVAL HISTORY:  ThBreven Guidroz89.o.  male pleasant patient above history of multiple myeloma most recently on Revlimid maintenance is here for follow-up.  Patient's Revlimid has been on hold since June 2020 because of worsening renal function.  Patient also underwent evaluation with Dr. SiCandiss Norseephrology.   Patient denies any nausea vomiting preoperative good pain no diarrhea.  No cough no shortness of breath.  He is not taking NSAIDs.  Review of Systems  Constitutional: Negative for chills, diaphoresis, fever, malaise/fatigue and weight loss.  HENT: Negative for nosebleeds and sore throat.   Eyes: Negative for double vision.  Respiratory: Negative for cough, hemoptysis, sputum production, shortness of breath and wheezing.   Cardiovascular: Negative for chest pain, palpitations, orthopnea and leg swelling.  Gastrointestinal: Negative for abdominal pain, blood in stool,  constipation, diarrhea, heartburn, melena, nausea and vomiting.  Genitourinary: Negative for dysuria, frequency and urgency.  Musculoskeletal: Negative for back pain and joint pain.  Skin: Negative.  Negative for itching and rash.  Neurological: Negative for dizziness, tingling, focal weakness, weakness and headaches.  Endo/Heme/Allergies: Does not bruise/bleed easily.  Psychiatric/Behavioral: Negative for depression. The patient is not nervous/anxious and does not have insomnia.       PAST MEDICAL HISTORY :  Past Medical History:  Diagnosis Date  . Diabetes mellitus without complication ([facility]  . GERD (gastroesophageal reflux disease)   . Hyperlipidemia   . Hypertension   . Multiple myeloma ([facility]CM[facility]ris[facility]  . Obesity   . Thyroid cancer ([facility]  . Thyroid disease     PAST SURGICAL HISTORY :   Past Surgical History:  Procedure Laterality Date  . BONE MARROW BIOPSY  2014  . CATARACT EXTRACTION BILATERAL W/ ANTERIOR VITRECTOMY      FAMILY HISTORY :   Family History  Problem Relation Age of Onset  . Cancer Father [id number]. Diabetes Mother     SOCIAL HISTORY:   Social History   Tobacco Use  . Smoking status: Never Smoker  . Smokeless tobacco: Never Used  Substance Use Topics  . Alcohol use: No  . Drug use: No    ALLERGIES:  has No Known Allergies.  MEDICATIONS:  Current Outpatient Medications  Medication Sig Dispense Refill  . acyclovir (ZOVIRAX) 400 MG tablet 400 mg 2 (two) times daily.    . Marland KitchenmLODipine (NORVASC) 10 MG tablet Take 10 mg by mouth daily.    . Marland Kitchenspirin EC 81 MG tablet Take 81 mg by mouth daily.    .[name]  atorvastatin (LIPITOR) 20 MG tablet Take 20 mg by mouth daily.    . cloNIDine (CATAPRES) 0.1 MG tablet Take 0.1 mg by mouth 2 (two) times daily.    . Cyanocobalamin (RA VITAMIN B-12 TR) 1000 MCG TBCR Take by mouth.    . levothyroxine (SYNTHROID) 137 MCG tablet Take 1 tablet by mouth daily.     [name] losartan (COZAAR) 100 MG tablet Take 100 mg by mouth daily.    [name] DELICA LANCETS 54T MISC Inject 1 Lancet as directed once daily.    . pantoprazole (PROTONIX) 40 MG tablet TAKE 1 TABLET BY MOUTH EVERY DAY 60 tablet 3  . pioglitazone (ACTOS) 30 MG tablet Take 30 mg by mouth daily.    . potassium chloride SA (K-DUR,KLOR-CON) 20 MEQ tablet Take 20 mEq by mouth 2 (two) times daily.   0  . quinapril (ACCUPRIL) 40 MG tablet Take 40 mg by mouth daily.    . baclofen (LIORESAL) 10 MG tablet Take 0.5 tablets (5 mg total) by mouth 3 (three) times daily. (Patient not taking: Reported on 08/16/2018) 30 each 0  . lenalidomide (REVLIMID) 15 MG capsule Take 1 capsule (15 mg total) by mouth daily for 3 weeks, then hold for 1 week off (Patient not taking: Reported on 08/16/2018) 21 capsule 0   No current facility-administered medications for this visit.    Facility-Administered Medications Ordered in Other Visits  Medication Dose Route Frequency Provider Last Rate Last Dose  . 0.9 %  sodium chloride infusion   Intravenous Continuous Cammie Sickle, MD   Stopped at 12/11/14 1528    PHYSICAL EXAMINATION: ECOG PERFORMANCE STATUS: 0 - Asymptomatic  BP (!) 152/83   Pulse (!) 50   Temp 97.9 F (36.6 C)   Resp 18   Wt 237 lb 3.2 oz (107.6 kg)   BMI 34.03 kg/m   Filed Weights   09/13/18 1327  Weight: 237 lb 3.2 oz (107.6 kg)    Physical Exam  Constitutional: He is oriented to person, place, and time and well-developed, well-nourished, and in no distress.  HENT:  Head: Normocephalic and atraumatic.  Mouth/Throat: Oropharynx is clear and moist. No oropharyngeal exudate.  Eyes: Pupils are equal, round, and reactive to light.  Neck: Normal range of motion. Neck supple.  Cardiovascular: Normal rate and regular rhythm.  Pulmonary/Chest: No respiratory distress. He has no wheezes.  Abdominal: Soft. Bowel sounds are normal. He exhibits no distension and no mass. There is no abdominal tenderness. There is no rebound and no guarding.  Musculoskeletal: Normal  range of motion.        General: No tenderness or edema.  Neurological: He is alert and oriented to person, place, and time.  Skin: Skin is warm.  Psychiatric: Affect normal.      LABORATORY DATA:  I have reviewed the data as listed    Component Value Date/Time   NA 138 09/13/2018 1302   NA 139 05/01/2014 1322   K 5.3 (H) 09/13/2018 1302   K 4.4 05/01/2014 1322   CL 107 09/13/2018 1302   CL 107 05/01/2014 1322   CO2 25 09/13/2018 1302   CO2 27 05/01/2014 1322   GLUCOSE 104 (H) 09/13/2018 1302   GLUCOSE 102 (H) 05/01/2014 1322   BUN 27 (H) 09/13/2018 1302   BUN 16 05/01/2014 1322   CREATININE 1.70 (H) 09/13/2018 1302   CREATININE 1.48 (H) 05/29/2014 0949   CALCIUM 9.2 09/13/2018 1302   CALCIUM 9.1 05/01/2014 1322   PROT  7.7 09/13/2018 1302   PROT 7.5 05/01/2014 1322   ALBUMIN 3.8 09/13/2018 1302   ALBUMIN 3.6 05/01/2014 1322   AST 16 09/13/2018 1302   AST 17 05/01/2014 1322   ALT 12 09/13/2018 1302   ALT 13 (L) 05/01/2014 1322   ALKPHOS 46 09/13/2018 1302   ALKPHOS 47 05/01/2014 1322   BILITOT 0.6 09/13/2018 1302   BILITOT 0.6 05/01/2014 1322   GFRNONAA 39 (L) 09/13/2018 1302   GFRNONAA 47 (L) 05/29/2014 0949   GFRAA 45 (L) 09/13/2018 1302   GFRAA 55 (L) 05/29/2014 0949    No results found for: SPEP, UPEP  Lab Results  Component Value Date   WBC 3.2 (L) 09/13/2018   NEUTROABS 1.8 09/13/2018   HGB 12.3 (L) 09/13/2018   HCT 37.0 (L) 09/13/2018   MCV 95.9 09/13/2018   PLT 188 09/13/2018      Chemistry      Component Value Date/Time   NA 138 09/13/2018 1302   NA 139 05/01/2014 1322   K 5.3 (H) 09/13/2018 1302   K 4.4 05/01/2014 1322   CL 107 09/13/2018 1302   CL 107 05/01/2014 1322   CO2 25 09/13/2018 1302   CO2 27 05/01/2014 1322   BUN 27 (H) 09/13/2018 1302   BUN 16 05/01/2014 1322   CREATININE 1.70 (H) 09/13/2018 1302   CREATININE 1.48 (H) 05/29/2014 0949      Component Value Date/Time   CALCIUM 9.2 09/13/2018 1302   CALCIUM 9.1 05/01/2014  1322   ALKPHOS 46 09/13/2018 1302   ALKPHOS 47 05/01/2014 1322   AST 16 09/13/2018 1302   AST 17 05/01/2014 1322   ALT 12 09/13/2018 1302   ALT 13 (L) 05/01/2014 1322   BILITOT 0.6 09/13/2018 1302   BILITOT 0.6 05/01/2014 1322       RADIOGRAPHIC STUDIES: I have personally reviewed the radiological images as listed and agreed with the findings in the report. No results found.   ASSESSMENT & PLAN:  Multiple myeloma in remission ([place]) #Multiple myeloma-most recently on maintenance Revlimid 15 mg 3 weeks/1 week off.  July 2020 negative for SPEP; however slowly rising kappa lambda light chain ratio=1.65.     # Continue to HOLD Revlimid at this time given the renal dysfunction [GFR- 40-50s].  Will consider 24-hour urine collection if renal function does not improve.  #CKD worsening GFR 40-50s [baseline creatinine 1.3-1.4; peak 1.8]; stable but not improving.  Question etiology.  S/p evaluation with nephrology Dr. Candiss Norse.  Continue to hold Revlimid for now.  #Bone modifying agent/multiple myeloma-continue hold Zometa/given renal dysfunction.  #  DISPOSITION: # NO Zometa today. # follow up in 6 weeks;-MD- cbc/cmp; possible MM panel/k-lamda light chains-Zometa- Dr.B   Orders Placed This Encounter  Procedures  . CBC with Differential    Standing Status:   Future    Standing Expiration Date:   09/13/2019  . Comprehensive metabolic panel    Standing Status:   Future    Standing Expiration Date:   09/13/2019  . Multiple Myeloma Panel (SPEP&IFE w/QIG)    Standing Status:   Future    Standing Expiration Date:   09/13/2019  . Kappa/lambda light chains    Standing Status:   Future    Standing Expiration Date:   09/13/2019   All questions were answered. The patient knows to call the clinic with any problems, questions or concerns.      Cammie Sickle, MD 09/29/2018 12:00 PM

## 2018-09-13 NOTE — Progress Notes (Signed)
Patient was advised by nephrologist to discontinue one of his BP meds but he is unsure of which one.

## 2018-09-13 NOTE — Assessment & Plan Note (Addendum)
#  Multiple myeloma-most recently on maintenance Revlimid 15 mg 3 weeks/1 week off.  July 2020 negative for SPEP; however slowly rising kappa lambda light chain ratio=1.65.     # Continue to HOLD Revlimid at this time given the renal dysfunction [GFR- 40-50s].  Will consider 24-hour urine collection if renal function does not improve.  #CKD worsening GFR 40-50s [baseline creatinine 1.3-1.4; peak 1.8]; stable but not improving.  Question etiology.  S/p evaluation with nephrology Dr. Candiss Norse.  Continue to hold Revlimid for now.  #Bone modifying agent/multiple myeloma-continue hold Zometa/given renal dysfunction.  #  DISPOSITION: # NO Zometa today. # follow up in 6 weeks;-MD- cbc/cmp; possible MM panel/k-lamda light chains-Zometa- Dr.B

## 2018-09-23 ENCOUNTER — Other Ambulatory Visit: Payer: Self-pay | Admitting: Physician Assistant

## 2018-10-24 ENCOUNTER — Encounter: Payer: Self-pay | Admitting: Internal Medicine

## 2018-10-25 ENCOUNTER — Inpatient Hospital Stay: Payer: Medicare HMO

## 2018-10-25 ENCOUNTER — Inpatient Hospital Stay (HOSPITAL_BASED_OUTPATIENT_CLINIC_OR_DEPARTMENT_OTHER): Payer: Medicare HMO | Admitting: Internal Medicine

## 2018-10-25 ENCOUNTER — Other Ambulatory Visit: Payer: Self-pay

## 2018-10-25 ENCOUNTER — Inpatient Hospital Stay: Payer: Medicare HMO | Attending: Internal Medicine

## 2018-10-25 DIAGNOSIS — K219 Gastro-esophageal reflux disease without esophagitis: Secondary | ICD-10-CM | POA: Insufficient documentation

## 2018-10-25 DIAGNOSIS — Z7984 Long term (current) use of oral hypoglycemic drugs: Secondary | ICD-10-CM | POA: Insufficient documentation

## 2018-10-25 DIAGNOSIS — E079 Disorder of thyroid, unspecified: Secondary | ICD-10-CM | POA: Insufficient documentation

## 2018-10-25 DIAGNOSIS — Z7982 Long term (current) use of aspirin: Secondary | ICD-10-CM | POA: Insufficient documentation

## 2018-10-25 DIAGNOSIS — Z79899 Other long term (current) drug therapy: Secondary | ICD-10-CM | POA: Insufficient documentation

## 2018-10-25 DIAGNOSIS — E785 Hyperlipidemia, unspecified: Secondary | ICD-10-CM | POA: Diagnosis not present

## 2018-10-25 DIAGNOSIS — E1122 Type 2 diabetes mellitus with diabetic chronic kidney disease: Secondary | ICD-10-CM | POA: Insufficient documentation

## 2018-10-25 DIAGNOSIS — I129 Hypertensive chronic kidney disease with stage 1 through stage 4 chronic kidney disease, or unspecified chronic kidney disease: Secondary | ICD-10-CM | POA: Diagnosis not present

## 2018-10-25 DIAGNOSIS — C9001 Multiple myeloma in remission: Secondary | ICD-10-CM | POA: Diagnosis present

## 2018-10-25 DIAGNOSIS — N189 Chronic kidney disease, unspecified: Secondary | ICD-10-CM | POA: Insufficient documentation

## 2018-10-25 LAB — CBC WITH DIFFERENTIAL/PLATELET
Abs Immature Granulocytes: 0.01 10*3/uL (ref 0.00–0.07)
Basophils Absolute: 0 10*3/uL (ref 0.0–0.1)
Basophils Relative: 1 %
Eosinophils Absolute: 0.1 10*3/uL (ref 0.0–0.5)
Eosinophils Relative: 2 %
HCT: 40.5 % (ref 39.0–52.0)
Hemoglobin: 13.1 g/dL (ref 13.0–17.0)
Immature Granulocytes: 0 %
Lymphocytes Relative: 20 %
Lymphs Abs: 0.7 10*3/uL (ref 0.7–4.0)
MCH: 31 pg (ref 26.0–34.0)
MCHC: 32.3 g/dL (ref 30.0–36.0)
MCV: 96 fL (ref 80.0–100.0)
Monocytes Absolute: 0.6 10*3/uL (ref 0.1–1.0)
Monocytes Relative: 17 %
Neutro Abs: 2.1 10*3/uL (ref 1.7–7.7)
Neutrophils Relative %: 60 %
Platelets: 220 10*3/uL (ref 150–400)
RBC: 4.22 MIL/uL (ref 4.22–5.81)
RDW: 14.2 % (ref 11.5–15.5)
WBC: 3.5 10*3/uL — ABNORMAL LOW (ref 4.0–10.5)
nRBC: 0 % (ref 0.0–0.2)

## 2018-10-25 LAB — COMPREHENSIVE METABOLIC PANEL
ALT: 16 U/L (ref 0–44)
AST: 18 U/L (ref 15–41)
Albumin: 3.8 g/dL (ref 3.5–5.0)
Alkaline Phosphatase: 56 U/L (ref 38–126)
Anion gap: 5 (ref 5–15)
BUN: 25 mg/dL — ABNORMAL HIGH (ref 8–23)
CO2: 28 mmol/L (ref 22–32)
Calcium: 9 mg/dL (ref 8.9–10.3)
Chloride: 107 mmol/L (ref 98–111)
Creatinine, Ser: 1.61 mg/dL — ABNORMAL HIGH (ref 0.61–1.24)
GFR calc Af Amer: 48 mL/min — ABNORMAL LOW (ref >60)
GFR calc non Af Amer: 42 mL/min — ABNORMAL LOW (ref >60)
Glucose, Bld: 101 mg/dL — ABNORMAL HIGH (ref 70–99)
Potassium: 4.5 mmol/L (ref 3.5–5.1)
Sodium: 140 mmol/L (ref 135–145)
Total Bilirubin: 0.6 mg/dL (ref 0.3–1.2)
Total Protein: 7.4 g/dL (ref 6.5–8.1)

## 2018-10-25 NOTE — Progress Notes (Signed)
[place] OFFICE PROGRESS NOTE  Patient Care Team: Baxter Hire, MD as PCP - General (Internal Medicine) Druscilla Brownie, MD as Consulting Physician (Dermatology)  Cancer Staging No matching staging information was found for the patient.   Oncology History Overview Note   # April 2014- MULTIPLE MYELOMA [IgG- 2.2gm/dl; 40-50% plasma cell; Cyto-N; FISH- gain of chr.9, 15 & ccnd1/11q13] s/p RVD x6 [sep 2014; BMBx- ~5% plasma cells]; Maint Rev-DEX-Zometa; March 2015-Stopped Dex Cont- Rev-Zometa; July 2016- Rev 75m; NOV 4th  2016-HOLD; [facility] declined BMT.   # Re-START FEB 2017 Rev 156m3 W- on & 1 w OFF.   # Zometa q 55M  # CKD [creat ~1.4; sec MM]; DM  -------------------------------------------------------------------------   DIAGNOSIS: [2[name] MULTIPLE MYELOMA  GOALS: control  CURRENT/MOST RECENT THERAPY- REVLIMID Maintenance [Feb 2017]    Multiple myeloma in remission ([facility] 10/01/2015 Initial Diagnosis   Multiple myeloma in remission (HCR[facility]      INTERVAL HISTORY:  [name]89.o.  male pleasant patient above history of multiple myeloma most recently on Revlimid maintenance is here for follow-up.  Patient's Revlimid is on hold since June 2020 because of renal function.  Patient's baseline creatinine is about 1.3-1.4; more recently creatinine has been around 1.6-1.8.  He has been drinking fluids.  No nausea no vomiting.  No chest pain or shortness of breath or cough.  No bone pain.  No worsening swelling in legs.   Review of Systems  Constitutional: Negative for chills, diaphoresis, fever, malaise/fatigue and weight loss.  HENT: Negative for nosebleeds and sore throat.   Eyes: Negative for double vision.  Respiratory: Negative for cough, hemoptysis, sputum production, shortness of breath and wheezing.   Cardiovascular: Negative for chest pain, palpitations, orthopnea and leg swelling.  Gastrointestinal: Negative for  abdominal pain, blood in stool, constipation, diarrhea, heartburn, melena, nausea and vomiting.  Genitourinary: Negative for dysuria, frequency and urgency.  Musculoskeletal: Negative for back pain and joint pain.  Skin: Negative.  Negative for itching and rash.  Neurological: Negative for dizziness, tingling, focal weakness, weakness and headaches.  Endo/Heme/Allergies: Does not bruise/bleed easily.  Psychiatric/Behavioral: Negative for depression. The patient is not nervous/anxious and does not have insomnia.       PAST MEDICAL HISTORY :  Past Medical History:  Diagnosis Date  . Diabetes mellitus without complication ([facility]an[facility]  . GERD (gastroesophageal reflux disease)   . Hyperlipidemia   . Hypertension   . Multiple myeloma (HCCircleville  . Obesity   . Thyroid cancer (HCVardaman  . Thyroid disease     PAST SURGICAL HISTORY :   Past Surgical History:  Procedure Laterality Date  . BONE MARROW BIOPSY  2014  . CATARACT EXTRACTION BILATERAL W/ ANTERIOR VITRECTOMY      FAMILY HISTORY :   Family History  Problem Relation Age of Onset  . Cancer Father [id number]. Diabetes Mother     SOCIAL HISTORY:   Social History   Tobacco Use  . Smoking status: Never Smoker  . Smokeless tobacco: Never Used  Substance Use Topics  . Alcohol use: No  . Drug use: No    ALLERGIES:  has No Known Allergies.  MEDICATIONS:  Current Outpatient Medications  Medication Sig Dispense Refill  . amLODipine (NORVASC) 10 MG tablet Take 10 mg by mouth daily.    . Marland Kitchenspirin EC 81 MG tablet Take 81 mg by mouth daily.    . Marland Kitchentorvastatin (LIPITOR) 20 MG  tablet Take 20 mg by mouth daily.    . baclofen (LIORESAL) 10 MG tablet Take 0.5 tablets (5 mg total) by mouth 3 (three) times daily. 30 each 0  . cloNIDine (CATAPRES) 0.1 MG tablet Take 0.1 mg by mouth 2 (two) times daily.    . Cyanocobalamin (RA VITAMIN B-12 TR) 1000 MCG TBCR Take by mouth.    . levothyroxine (SYNTHROID) 137 MCG tablet Take 1 tablet by mouth daily.     [name]  losartan (COZAAR) 100 MG tablet Take 100 mg by mouth daily.    [name] DELICA LANCETS 84X MISC Inject 1 Lancet as directed once daily.    . pantoprazole (PROTONIX) 40 MG tablet TAKE 1 TABLET BY MOUTH EVERY DAY 60 tablet 3  . pioglitazone (ACTOS) 30 MG tablet Take 30 mg by mouth daily.    . potassium chloride SA (K-DUR,KLOR-CON) 20 MEQ tablet Take 20 mEq by mouth 2 (two) times daily.   0  . quinapril (ACCUPRIL) 40 MG tablet Take 40 mg by mouth daily.    [name] lenalidomide (REVLIMID) 15 MG capsule Take 1 capsule (15 mg total) by mouth daily for 3 weeks, then hold for 1 week off (Patient not taking: Reported on 08/16/2018) 21 capsule 0   No current facility-administered medications for this visit.    Facility-Administered Medications Ordered in Other Visits  Medication Dose Route Frequency Provider Last Rate Last Dose  . 0.9 %  sodium chloride infusion   Intravenous Continuous Cammie Sickle, MD   Stopped at 12/11/14 1528    PHYSICAL EXAMINATION: ECOG PERFORMANCE STATUS: 0 - Asymptomatic  BP (!) 154/82 (BP Location: Left Arm, Patient Position: Sitting)   Pulse (!) 55   Temp (!) 97.2 F (36.2 C) (Tympanic)   Resp 20   Ht '5\' 10"'  (1.778 m)   Wt 235 lb (106.6 kg)   BMI 33.72 kg/m   Filed Weights   10/25/18 1259  Weight: 235 lb (106.6 kg)    Physical Exam  Constitutional: He is oriented to person, place, and time and well-developed, well-nourished, and in no distress.  HENT:  Head: Normocephalic and atraumatic.  Mouth/Throat: Oropharynx is clear and moist. No oropharyngeal exudate.  Eyes: Pupils are equal, round, and reactive to light.  Neck: Normal range of motion. Neck supple.  Cardiovascular: Normal rate and regular rhythm.  Pulmonary/Chest: No respiratory distress. He has no wheezes.  Abdominal: Soft. Bowel sounds are normal. He exhibits no distension and no mass. There is no abdominal tenderness. There is no rebound and no guarding.  Musculoskeletal: Normal range of  motion.        General: No tenderness or edema.  Neurological: He is alert and oriented to person, place, and time.  Skin: Skin is warm.  Psychiatric: Affect normal.      LABORATORY DATA:  I have reviewed the data as listed    Component Value Date/Time   NA 140 10/25/2018 1246   NA 139 05/01/2014 1322   K 4.5 10/25/2018 1246   K 4.4 05/01/2014 1322   CL 107 10/25/2018 1246   CL 107 05/01/2014 1322   CO2 28 10/25/2018 1246   CO2 27 05/01/2014 1322   GLUCOSE 101 (H) 10/25/2018 1246   GLUCOSE 102 (H) 05/01/2014 1322   BUN 25 (H) 10/25/2018 1246   BUN 16 05/01/2014 1322   CREATININE 1.61 (H) 10/25/2018 1246   CREATININE 1.48 (H) 05/29/2014 0949   CALCIUM 9.0 10/25/2018 1246   CALCIUM 9.1 05/01/2014 1322  PROT 7.4 10/25/2018 1246   PROT 7.5 05/01/2014 1322   ALBUMIN 3.8 10/25/2018 1246   ALBUMIN 3.6 05/01/2014 1322   AST 18 10/25/2018 1246   AST 17 05/01/2014 1322   ALT 16 10/25/2018 1246   ALT 13 (L) 05/01/2014 1322   ALKPHOS 56 10/25/2018 1246   ALKPHOS 47 05/01/2014 1322   BILITOT 0.6 10/25/2018 1246   BILITOT 0.6 05/01/2014 1322   GFRNONAA 42 (L) 10/25/2018 1246   GFRNONAA 47 (L) 05/29/2014 0949   GFRAA 48 (L) 10/25/2018 1246   GFRAA 55 (L) 05/29/2014 0949    No results found for: SPEP, UPEP  Lab Results  Component Value Date   WBC 3.5 (L) 10/25/2018   NEUTROABS 2.1 10/25/2018   HGB 13.1 10/25/2018   HCT 40.5 10/25/2018   MCV 96.0 10/25/2018   PLT 220 10/25/2018      Chemistry      Component Value Date/Time   NA 140 10/25/2018 1246   NA 139 05/01/2014 1322   K 4.5 10/25/2018 1246   K 4.4 05/01/2014 1322   CL 107 10/25/2018 1246   CL 107 05/01/2014 1322   CO2 28 10/25/2018 1246   CO2 27 05/01/2014 1322   BUN 25 (H) 10/25/2018 1246   BUN 16 05/01/2014 1322   CREATININE 1.61 (H) 10/25/2018 1246   CREATININE 1.48 (H) 05/29/2014 0949      Component Value Date/Time   CALCIUM 9.0 10/25/2018 1246   CALCIUM 9.1 05/01/2014 1322   ALKPHOS 56  10/25/2018 1246   ALKPHOS 47 05/01/2014 1322   AST 18 10/25/2018 1246   AST 17 05/01/2014 1322   ALT 16 10/25/2018 1246   ALT 13 (L) 05/01/2014 1322   BILITOT 0.6 10/25/2018 1246   BILITOT 0.6 05/01/2014 1322       RADIOGRAPHIC STUDIES: I have personally reviewed the radiological images as listed and agreed with the findings in the report. No results found.   ASSESSMENT & PLAN:  Multiple myeloma in remission ([place]) #Multiple myeloma-most recently on maintenance Revlimid 15 mg 3 weeks/1 week off.  July 2020 negative for SPEP; however slowly rising kappa lambda light chain ratio=1.65.     #I would recommend continuing to hold Revlimid at this time; given the renal insufficiency [GFR 40s].  No clinical concerns for myeloma/as holding Revlimid-improve the hemoglobin.  #CKD -  GFR 40-50s [baseline creatinine 1.3-1.4; peak 1.8]; stable question etiology.  S/p evaluation with nephrology Dr. Candiss Norse.  Continue to hold Revlimid for now.  We will speak to Dr. Candiss Norse about restarting Revlimid/Zometa.  #Bone modifying agent/multiple myeloma-continue hold Zometa/given renal dysfunction.  Calcium 9.5.  Hold Zometa because of creatinine 1.6.  #  DISPOSITION: We will give patient a call regarding the plan after discussing with Dr. Candiss Norse # NO Zometa today. # follow up in 6 weeks;-MD- cbc/cmp;-possible Zometa- Dr.B   No orders of the defined types were placed in this encounter.  All questions were answered. The patient knows to call the clinic with any problems, questions or concerns.      Cammie Sickle, MD 10/25/2018 1:37 PM

## 2018-10-25 NOTE — Assessment & Plan Note (Addendum)
#  Multiple myeloma-most recently on maintenance Revlimid 15 mg 3 weeks/1 week off.  July 2020 negative for SPEP; however slowly rising kappa lambda light chain ratio=1.65.     #I would recommend continuing to hold Revlimid at this time; given the renal insufficiency [GFR 40s].  No clinical concerns for myeloma/as holding Revlimid-improve the hemoglobin.  #CKD -  GFR 40-50s [baseline creatinine 1.3-1.4; peak 1.8]; stable question etiology.  S/p evaluation with nephrology Dr. Candiss Norse.  Continue to hold Revlimid for now.  We will speak to Dr. Candiss Norse about restarting Revlimid/Zometa.  #Bone modifying agent/multiple myeloma-continue hold Zometa/given renal dysfunction.  Calcium 9.5.  Hold Zometa because of creatinine 1.6.  #  DISPOSITION: We will give patient a call regarding the plan after discussing with Dr. Candiss Norse # NO Zometa today. # follow up in 6 weeks;-MD- cbc/cmp;-possible Zometa- Dr.B

## 2018-10-28 LAB — MULTIPLE MYELOMA PANEL, SERUM
Albumin SerPl Elph-Mcnc: 3.8 g/dL (ref 2.9–4.4)
Albumin/Glob SerPl: 1.2 (ref 0.7–1.7)
Alpha 1: 0.2 g/dL (ref 0.0–0.4)
Alpha2 Glob SerPl Elph-Mcnc: 0.7 g/dL (ref 0.4–1.0)
B-Globulin SerPl Elph-Mcnc: 1 g/dL (ref 0.7–1.3)
Gamma Glob SerPl Elph-Mcnc: 1.5 g/dL (ref 0.4–1.8)
Globulin, Total: 3.3 g/dL (ref 2.2–3.9)
IgA: 458 mg/dL — ABNORMAL HIGH (ref 61–437)
IgG (Immunoglobin G), Serum: 1679 mg/dL — ABNORMAL HIGH (ref 603–1613)
IgM (Immunoglobulin M), Srm: 34 mg/dL (ref 15–143)
Total Protein ELP: 7.1 g/dL (ref 6.0–8.5)

## 2018-10-28 LAB — KAPPA/LAMBDA LIGHT CHAINS
Kappa free light chain: 44.9 mg/L — ABNORMAL HIGH (ref 3.3–19.4)
Kappa, lambda light chain ratio: 1.77 — ABNORMAL HIGH (ref 0.26–1.65)
Lambda free light chains: 25.3 mg/L (ref 5.7–26.3)

## 2018-10-29 ENCOUNTER — Telehealth: Payer: Self-pay | Admitting: Internal Medicine

## 2018-10-29 NOTE — Telephone Encounter (Signed)
Late entry. Discussed with Dr. Candiss Norse -patient's chronic kidney disease is likely secondary to history of diabetes hypertension; especially given the absence of any obstruction /or worsening myeloma.    I spoke to patient on 9/18-there would recommend restarting Revlimid 15 mg a day 2 weeks on 2 weeks off.  Follow-up as planned  GB

## 2018-12-06 ENCOUNTER — Other Ambulatory Visit: Payer: Self-pay

## 2018-12-06 ENCOUNTER — Other Ambulatory Visit: Payer: Self-pay | Admitting: *Deleted

## 2018-12-06 ENCOUNTER — Inpatient Hospital Stay: Payer: Medicare HMO

## 2018-12-06 ENCOUNTER — Inpatient Hospital Stay: Payer: Medicare HMO | Attending: Nurse Practitioner

## 2018-12-06 ENCOUNTER — Encounter: Payer: Self-pay | Admitting: Nurse Practitioner

## 2018-12-06 ENCOUNTER — Inpatient Hospital Stay (HOSPITAL_BASED_OUTPATIENT_CLINIC_OR_DEPARTMENT_OTHER): Payer: Medicare HMO | Admitting: Nurse Practitioner

## 2018-12-06 VITALS — BP 136/79 | HR 58 | Temp 96.7°F | Resp 16 | Wt 233.2 lb

## 2018-12-06 DIAGNOSIS — C9001 Multiple myeloma in remission: Secondary | ICD-10-CM

## 2018-12-06 DIAGNOSIS — K219 Gastro-esophageal reflux disease without esophagitis: Secondary | ICD-10-CM | POA: Insufficient documentation

## 2018-12-06 DIAGNOSIS — E1122 Type 2 diabetes mellitus with diabetic chronic kidney disease: Secondary | ICD-10-CM | POA: Insufficient documentation

## 2018-12-06 DIAGNOSIS — Z7982 Long term (current) use of aspirin: Secondary | ICD-10-CM | POA: Diagnosis not present

## 2018-12-06 DIAGNOSIS — Z79899 Other long term (current) drug therapy: Secondary | ICD-10-CM | POA: Insufficient documentation

## 2018-12-06 DIAGNOSIS — E785 Hyperlipidemia, unspecified: Secondary | ICD-10-CM | POA: Insufficient documentation

## 2018-12-06 DIAGNOSIS — R7989 Other specified abnormal findings of blood chemistry: Secondary | ICD-10-CM | POA: Diagnosis not present

## 2018-12-06 DIAGNOSIS — N1831 Chronic kidney disease, stage 3a: Secondary | ICD-10-CM

## 2018-12-06 DIAGNOSIS — N189 Chronic kidney disease, unspecified: Secondary | ICD-10-CM | POA: Diagnosis not present

## 2018-12-06 DIAGNOSIS — Z85850 Personal history of malignant neoplasm of thyroid: Secondary | ICD-10-CM | POA: Insufficient documentation

## 2018-12-06 DIAGNOSIS — I129 Hypertensive chronic kidney disease with stage 1 through stage 4 chronic kidney disease, or unspecified chronic kidney disease: Secondary | ICD-10-CM | POA: Diagnosis not present

## 2018-12-06 LAB — COMPREHENSIVE METABOLIC PANEL
ALT: 15 U/L (ref 0–44)
AST: 18 U/L (ref 15–41)
Albumin: 3.8 g/dL (ref 3.5–5.0)
Alkaline Phosphatase: 55 U/L (ref 38–126)
Anion gap: 6 (ref 5–15)
BUN: 24 mg/dL — ABNORMAL HIGH (ref 8–23)
CO2: 24 mmol/L (ref 22–32)
Calcium: 9 mg/dL (ref 8.9–10.3)
Chloride: 109 mmol/L (ref 98–111)
Creatinine, Ser: 1.65 mg/dL — ABNORMAL HIGH (ref 0.61–1.24)
GFR calc Af Amer: 47 mL/min — ABNORMAL LOW (ref >60)
GFR calc non Af Amer: 40 mL/min — ABNORMAL LOW (ref >60)
Glucose, Bld: 102 mg/dL — ABNORMAL HIGH (ref 70–99)
Potassium: 4.7 mmol/L (ref 3.5–5.1)
Sodium: 139 mmol/L (ref 135–145)
Total Bilirubin: 0.5 mg/dL (ref 0.3–1.2)
Total Protein: 7.7 g/dL (ref 6.5–8.1)

## 2018-12-06 LAB — CBC WITH DIFFERENTIAL/PLATELET
Abs Immature Granulocytes: 0.01 10*3/uL (ref 0.00–0.07)
Basophils Absolute: 0 10*3/uL (ref 0.0–0.1)
Basophils Relative: 0 %
Eosinophils Absolute: 0.1 10*3/uL (ref 0.0–0.5)
Eosinophils Relative: 3 %
HCT: 40.5 % (ref 39.0–52.0)
Hemoglobin: 13.2 g/dL (ref 13.0–17.0)
Immature Granulocytes: 0 %
Lymphocytes Relative: 25 %
Lymphs Abs: 0.9 10*3/uL (ref 0.7–4.0)
MCH: 31 pg (ref 26.0–34.0)
MCHC: 32.6 g/dL (ref 30.0–36.0)
MCV: 95.1 fL (ref 80.0–100.0)
Monocytes Absolute: 0.5 10*3/uL (ref 0.1–1.0)
Monocytes Relative: 14 %
Neutro Abs: 2 10*3/uL (ref 1.7–7.7)
Neutrophils Relative %: 58 %
Platelets: 209 10*3/uL (ref 150–400)
RBC: 4.26 MIL/uL (ref 4.22–5.81)
RDW: 13.2 % (ref 11.5–15.5)
WBC: 3.5 10*3/uL — ABNORMAL LOW (ref 4.0–10.5)
nRBC: 0 % (ref 0.0–0.2)

## 2018-12-06 MED ORDER — SODIUM CHLORIDE 0.9 % IV SOLN
INTRAVENOUS | Status: DC
  Filled 2018-12-06: qty 250

## 2018-12-06 MED ORDER — ZOLEDRONIC ACID 4 MG/5ML IV CONC
3.0000 mg | Freq: Once | INTRAVENOUS | Status: AC
  Administered 2018-12-06: 3 mg via INTRAVENOUS
  Filled 2018-12-06: qty 3.75

## 2018-12-06 MED ORDER — LENALIDOMIDE 15 MG PO CAPS: ORAL_CAPSULE | ORAL | 0 refills | Status: DC

## 2018-12-06 NOTE — Progress Notes (Signed)
[place] OFFICE PROGRESS NOTE  Patient Care Team: Baxter Hire, MD as PCP - General (Internal Medicine) Druscilla Brownie, MD as Consulting Physician (Dermatology)  Cancer Staging No matching staging information was found for the patient.   Oncology History Overview Note   # April 2014- MULTIPLE MYELOMA [IgG- 2.2gm/dl; 40-50% plasma cell; Cyto-N; FISH- gain of chr.9, 15 & ccnd1/11q13] s/p RVD x6 [sep 2014; BMBx- ~5% plasma cells]; Maint Rev-DEX-Zometa; March 2015-Stopped Dex Cont- Rev-Zometa; July 2016- Rev 84m; NOV 4th  2016-HOLD; [facility] declined BMT.   # Re-START FEB 2017 Rev 112m3 W- on & 1 w OFF.   # Zometa q 21M  # CKD [creat ~1.4; sec MM]; DM  -------------------------------------------------------------------------   DIAGNOSIS: [2[name] MULTIPLE MYELOMA  GOALS: control  CURRENT/MOST RECENT THERAPY- REVLIMID Maintenance [Feb 2017]    Multiple myeloma in remission ([facility] 10/01/2015 Initial Diagnosis   Multiple myeloma in remission ([facility]      INTERVAL HISTORY: Alan [age]org89.o.  male pleasant patient with above history of multiple myeloma, most recently on Revlimid maintenance, returns to clinic for consideration of restarting Revlimid and Zometa.  Patient's Revlimid has been on hold since June 2020 due to renal function.  Baseline creatinine is about 1.3-1.4.  More recently, creatinine has been around 1.6-1.8.  pleasant patient above history of multiple myeloma most recently on Revlimid maintenance is here for follow-up.  Patient's Revlimid is on hold since June 2020 because of renal function.  Patient's baseline creatinine is about 1.3-1.4; more recently creatinine has been around 1.6-1.8 and Revlimid was held.  He was seen by Dr.Singh/nephrology who advised that elevated creatinine likely secondary to hypertension diabetes as opposed to multiple myeloma. Dr. BrRogue Bussingecommended restarting Revlimid 15 mg a day, 2 weeks  on, 2 weeks off. Patient says he has not yet started Revlimid.  Today, since Tuesday eating and drinking well.  No nausea or vomiting.  No chest pain or shortness of breath.  Denies new aches or pains.  No bone pain.  No worsening swelling in the legs.  Says he feels at baseline.   Review of Systems  Constitutional: Negative for chills, diaphoresis, fever, malaise/fatigue and weight loss.  HENT: Negative for nosebleeds and sore throat.   Eyes: Negative for double vision.  Respiratory: Negative for cough, hemoptysis, sputum production, shortness of breath and wheezing.   Cardiovascular: Negative for chest pain, palpitations, orthopnea and leg swelling.  Gastrointestinal: Negative for abdominal pain, blood in stool, constipation, diarrhea, heartburn, melena, nausea and vomiting.  Genitourinary: Negative for dysuria, frequency and urgency.  Musculoskeletal: Negative for back pain and joint pain.  Skin: Negative.  Negative for itching and rash.  Neurological: Negative for dizziness, tingling, focal weakness, weakness and headaches.  Endo/Heme/Allergies: Does not bruise/bleed easily.  Psychiatric/Behavioral: Negative for depression. The patient is not nervous/anxious and does not have insomnia.       PAST MEDICAL HISTORY :  Past Medical History:  Diagnosis Date  . Diabetes mellitus without complication ([facility]  . GERD (gastroesophageal reflux disease)   . Hyperlipidemia   . Hypertension   . Multiple myeloma ([facility] [place]  . Obesity   . Thyroid cancer ([facility]  . Thyroid disease     PAST SURGICAL HISTORY :   Past Surgical History:  Procedure Laterality Date  . BONE MARROW BIOPSY  2014  . CATARACT EXTRACTION BILATERAL W/ ANTERIOR VITRECTOMY      FAMILY HISTORY :   Family  History  Problem Relation Age of Onset  . Cancer Father 33  . Diabetes Mother     SOCIAL HISTORY:   Social History   Tobacco Use  . Smoking status: Never Smoker  . Smokeless tobacco: Never Used  Substance Use  Topics  . Alcohol use: No  . Drug use: No    ALLERGIES:  has No Known Allergies.  MEDICATIONS:  Current Outpatient Medications  Medication Sig Dispense Refill  . amLODipine (NORVASC) 10 MG tablet Take 10 mg by mouth daily.    [name] aspirin EC 81 MG tablet Take 81 mg by mouth daily.    [name] atorvastatin (LIPITOR) 20 MG tablet Take 20 mg by mouth daily.    . cloNIDine (CATAPRES) 0.1 MG tablet Take 0.1 mg by mouth 2 (two) times daily.    . Cyanocobalamin (RA VITAMIN B-12 TR) 1000 MCG TBCR Take by mouth.    . levothyroxine (SYNTHROID) 137 MCG tablet Take 1 tablet by mouth daily.     [name] losartan (COZAAR) 100 MG tablet Take 100 mg by mouth daily.    . pioglitazone (ACTOS) 30 MG tablet Take 30 mg by mouth daily.    . potassium chloride SA (K-DUR,KLOR-CON) 20 MEQ tablet Take 20 mEq by mouth 2 (two) times daily.   0  . quinapril (ACCUPRIL) 40 MG tablet Take 40 mg by mouth daily.    . baclofen (LIORESAL) 10 MG tablet Take 0.5 tablets (5 mg total) by mouth 3 (three) times daily. (Patient not taking: Reported on 12/06/2018) 30 each 0  . lenalidomide (REVLIMID) 15 MG capsule Take 1 capsule (15 mg total) by mouth daily for 3 weeks, then hold for 1 week off (Patient not taking: Reported on 12/06/2018) 21 capsule 0  . ONETOUCH DELICA LANCETS 92J MISC Inject 1 Lancet as directed once daily.    . pantoprazole (PROTONIX) 40 MG tablet TAKE 1 TABLET BY MOUTH EVERY DAY (Patient not taking: Reported on 12/06/2018) 60 tablet 3   No current facility-administered medications for this visit.    Facility-Administered Medications Ordered in Other Visits  Medication Dose Route Frequency Provider Last Rate Last Dose  . 0.9 %  sodium chloride infusion   Intravenous Continuous Cammie Sickle, MD   Stopped at 12/11/14 1528  . 0.9 %  sodium chloride infusion   Intravenous Continuous Cammie Sickle, MD 10 mL/hr at 12/06/18 1432      PHYSICAL EXAMINATION: ECOG PERFORMANCE STATUS: 0 - Asymptomatic  BP 136/79 (BP  Location: Right Arm, Patient Position: Sitting)   Pulse (!) 58   Temp (!) 96.7 F (35.9 C) (Temporal)   Resp 16   Wt 233 lb 3.2 oz (105.8 kg)   BMI 33.46 kg/m   Filed Weights   12/06/18 1324  Weight: 233 lb 3.2 oz (105.8 kg)    Physical Exam  Constitutional: He is oriented to person, place, and time and well-developed, well-nourished, and in no distress.  Unaccompanied.  Wearing mask.  HENT:  Head: Normocephalic and atraumatic.  Mouth/Throat: Oropharynx is clear and moist. No oropharyngeal exudate.  Eyes: Conjunctivae are normal. No scleral icterus.  Neck: Normal range of motion. Neck supple.  Cardiovascular: Normal rate and regular rhythm.  Pulmonary/Chest: No respiratory distress. He has no wheezes.  Abdominal: Soft. Bowel sounds are normal. He exhibits no distension and no mass. There is no abdominal tenderness. There is no rebound and no guarding.  BMI 33.46  Musculoskeletal: Normal range of motion.        General:  No tenderness or edema.  Lymphadenopathy:    He has no cervical adenopathy.  Neurological: He is alert and oriented to person, place, and time.  Skin: Skin is warm and dry.  Psychiatric: Mood and affect normal.    LABORATORY DATA:  I have reviewed the data as listed    Component Value Date/Time   NA 139 12/06/2018 1307   NA 139 05/01/2014 1322   K 4.7 12/06/2018 1307   K 4.4 05/01/2014 1322   CL 109 12/06/2018 1307   CL 107 05/01/2014 1322   CO2 24 12/06/2018 1307   CO2 27 05/01/2014 1322   GLUCOSE 102 (H) 12/06/2018 1307   GLUCOSE 102 (H) 05/01/2014 1322   BUN 24 (H) 12/06/2018 1307   BUN 16 05/01/2014 1322   CREATININE 1.65 (H) 12/06/2018 1307   CREATININE 1.48 (H) 05/29/2014 0949   CALCIUM 9.0 12/06/2018 1307   CALCIUM 9.1 05/01/2014 1322   PROT 7.7 12/06/2018 1307   PROT 7.5 05/01/2014 1322   ALBUMIN 3.8 12/06/2018 1307   ALBUMIN 3.6 05/01/2014 1322   AST 18 12/06/2018 1307   AST 17 05/01/2014 1322   ALT 15 12/06/2018 1307   ALT 13 (L)  05/01/2014 1322   ALKPHOS 55 12/06/2018 1307   ALKPHOS 47 05/01/2014 1322   BILITOT 0.5 12/06/2018 1307   BILITOT 0.6 05/01/2014 1322   GFRNONAA 40 (L) 12/06/2018 1307   GFRNONAA 47 (L) 05/29/2014 0949   GFRAA 47 (L) 12/06/2018 1307   GFRAA 55 (L) 05/29/2014 0949    No results found for: SPEP, UPEP  Lab Results  Component Value Date   WBC 3.5 (L) 12/06/2018   NEUTROABS 2.0 12/06/2018   HGB 13.2 12/06/2018   HCT 40.5 12/06/2018   MCV 95.1 12/06/2018   PLT 209 12/06/2018      Chemistry      Component Value Date/Time   NA 139 12/06/2018 1307   NA 139 05/01/2014 1322   K 4.7 12/06/2018 1307   K 4.4 05/01/2014 1322   CL 109 12/06/2018 1307   CL 107 05/01/2014 1322   CO2 24 12/06/2018 1307   CO2 27 05/01/2014 1322   BUN 24 (H) 12/06/2018 1307   BUN 16 05/01/2014 1322   CREATININE 1.65 (H) 12/06/2018 1307   CREATININE 1.48 (H) 05/29/2014 0949      Component Value Date/Time   CALCIUM 9.0 12/06/2018 1307   CALCIUM 9.1 05/01/2014 1322   ALKPHOS 55 12/06/2018 1307   ALKPHOS 47 05/01/2014 1322   AST 18 12/06/2018 1307   AST 17 05/01/2014 1322   ALT 15 12/06/2018 1307   ALT 13 (L) 05/01/2014 1322   BILITOT 0.5 12/06/2018 1307   BILITOT 0.6 05/01/2014 1322       RADIOGRAPHIC STUDIES: I have personally reviewed the radiological images as listed and agreed with the findings in the report. No results found.   ASSESSMENT & PLAN:  Multiple myeloma in remission ([place]) #Multiple myeloma-most recently on maintenance Revlimid 15 mg 3 weeks on 1 week off.  July 2020 - for SPEP however, slowly rising kappa lambda light chain ratio is 1.65.  Revlimid held due to renal insufficiency (GFR 40s).  No clinical concerns of myeloma/is holding Revlimid.  Improving hemoglobin (13.2 today).  Clinically stable today.  #Patient has not yet restarted Revlimid.  Advised him of Dr.Brahmanday's recommendation to restart Revlimid 15 mg 2 weeks on, 2 weeks off.  Patient agreeable with this.  Will  coordinate getting the medication to him so  he can start upon receipt.  # CKD-GFR 40s-50s (baseline creatinine 1.3-1.4; peak 1.8).  Discussed with nephrology/Dr. Candiss Norse who advises CKD likely secondary to diabetes and hypertension.  Creatinine 1.65 today.  Blood pressure improved.  Continue to follow-up with nephrology.  #Bone modifying agent/multiple myeloma-Zometa has been held due to renal dysfunction.  Case discussed with Dr. Grayland Ormond who recommends proceeding with renally dosed Zometa 5m.  Calcium 9.0.  Disposition: Proceed with Zometa 3 mg today Initiate Revlimid upon receipt 2 weeks on, 2 weeks off Return to clinic in 4 weeks for labs (CBC, CMP, kappa lambda light chains, multiple myeloma panel), follow-up with Dr. BRogue Bussing and consideration of Zometa.    Orders Placed This Encounter  Procedures  . CBC with Differential    Standing Status:   Future    Standing Expiration Date:   12/06/2019  . Comprehensive metabolic panel    Standing Status:   Future    Standing Expiration Date:   12/06/2019  . Multiple Myeloma Panel (SPEP&IFE w/QIG)    Standing Status:   Future    Standing Expiration Date:   12/06/2019  . Kappa/lambda light chains    Standing Status:   Future    Standing Expiration Date:   12/06/2019   All questions were answered. The patient knows to call the clinic with any problems, questions or concerns.   LBeckey Rutter DNP, AGNP-[facility]. BRogue Bussing

## 2018-12-06 NOTE — Assessment & Plan Note (Signed)
#  Multiple myeloma-most recently on maintenance Revlimid 15 mg 3 weeks on 1 week off.  July 2020 - for SPEP however, slowly rising kappa lambda light chain ratio is 1.65.  Revlimid held due to renal insufficiency (GFR 40s).  No clinical concerns of myeloma/is holding Revlimid.  Improving hemoglobin (13.2 today).  Clinically stable today.  #Patient has not yet restarted Revlimid.  Advised him of Dr.Brahmanday's recommendation to restart Revlimid 15 mg 2 weeks on, 2 weeks off.  Patient agreeable with this.  Will coordinate getting the medication to him so he can start upon receipt.  # CKD-GFR 40s-50s (baseline creatinine 1.3-1.4; peak 1.8).  Discussed with nephrology/Dr. Candiss Norse who advises CKD likely secondary to diabetes and hypertension.  Creatinine 1.65 today.  Blood pressure improved.  Continue to follow-up with nephrology.  #Bone modifying agent/multiple myeloma-Zometa has been held due to renal dysfunction.  Case discussed with Dr. Grayland Ormond who recommends proceeding with renally dosed Zometa '3mg'$ .  Calcium 9.0.  Disposition: Proceed with Zometa 3 mg today Initiate Revlimid upon receipt 2 weeks on, 2 weeks off Return to clinic in 4 weeks for labs (CBC, CMP, kappa lambda light chains, multiple myeloma panel), follow-up with Dr. Rogue Bussing, and consideration of Zometa.

## 2018-12-09 ENCOUNTER — Telehealth: Payer: Self-pay | Admitting: Pharmacy Technician

## 2018-12-09 NOTE — Telephone Encounter (Signed)
Oral Oncology Patient Advocate Encounter  Received notification from [place] that prior authorization for Revlimid is required.  PA submitted on CoverMyMeds Key [id number]  Status is pending  Oral Oncology Clinic will continue to follow.  [place] Patient [place] Phone [phone] Fax [phone] 12/09/2018 2:00 PM

## 2018-12-09 NOTE — Telephone Encounter (Signed)
Oral Oncology Patient Advocate Encounter  Prior Authorization for Revlimid has been approved.    PA# [id number] Effective dates: 02/06/2018 through 02/06/2019  I will inform [employer] of the approval.  Oral Oncology Clinic will continue to follow.   [place] Patient [place] Phone [phone] Fax [phone] 12/09/2018 2:09 PM

## 2018-12-10 NOTE — Telephone Encounter (Signed)
[employer] rep emailed that patient has a copay of $564 and they would be reaching out to the patient for copay assistance.

## 2019-01-01 ENCOUNTER — Encounter: Payer: Self-pay | Admitting: Internal Medicine

## 2019-01-01 ENCOUNTER — Other Ambulatory Visit: Payer: Self-pay

## 2019-01-06 ENCOUNTER — Inpatient Hospital Stay (HOSPITAL_BASED_OUTPATIENT_CLINIC_OR_DEPARTMENT_OTHER): Payer: Medicare HMO | Admitting: Internal Medicine

## 2019-01-06 ENCOUNTER — Inpatient Hospital Stay: Payer: Medicare HMO | Attending: Internal Medicine

## 2019-01-06 ENCOUNTER — Inpatient Hospital Stay: Payer: Medicare HMO

## 2019-01-06 ENCOUNTER — Other Ambulatory Visit: Payer: Self-pay

## 2019-01-06 DIAGNOSIS — Z7984 Long term (current) use of oral hypoglycemic drugs: Secondary | ICD-10-CM | POA: Diagnosis not present

## 2019-01-06 DIAGNOSIS — E1122 Type 2 diabetes mellitus with diabetic chronic kidney disease: Secondary | ICD-10-CM | POA: Diagnosis not present

## 2019-01-06 DIAGNOSIS — Z85850 Personal history of malignant neoplasm of thyroid: Secondary | ICD-10-CM | POA: Diagnosis not present

## 2019-01-06 DIAGNOSIS — N183 Chronic kidney disease, stage 3 unspecified: Secondary | ICD-10-CM | POA: Diagnosis not present

## 2019-01-06 DIAGNOSIS — Z7982 Long term (current) use of aspirin: Secondary | ICD-10-CM | POA: Insufficient documentation

## 2019-01-06 DIAGNOSIS — Z79899 Other long term (current) drug therapy: Secondary | ICD-10-CM | POA: Diagnosis not present

## 2019-01-06 DIAGNOSIS — E785 Hyperlipidemia, unspecified: Secondary | ICD-10-CM | POA: Diagnosis not present

## 2019-01-06 DIAGNOSIS — K219 Gastro-esophageal reflux disease without esophagitis: Secondary | ICD-10-CM | POA: Insufficient documentation

## 2019-01-06 DIAGNOSIS — C9001 Multiple myeloma in remission: Secondary | ICD-10-CM

## 2019-01-06 DIAGNOSIS — I129 Hypertensive chronic kidney disease with stage 1 through stage 4 chronic kidney disease, or unspecified chronic kidney disease: Secondary | ICD-10-CM | POA: Diagnosis not present

## 2019-01-06 LAB — CBC WITH DIFFERENTIAL/PLATELET
Abs Immature Granulocytes: 0.01 10*3/uL (ref 0.00–0.07)
Basophils Absolute: 0 10*3/uL (ref 0.0–0.1)
Basophils Relative: 1 %
Eosinophils Absolute: 0.1 10*3/uL (ref 0.0–0.5)
Eosinophils Relative: 2 %
HCT: 41.4 % (ref 39.0–52.0)
Hemoglobin: 13.2 g/dL (ref 13.0–17.0)
Immature Granulocytes: 0 %
Lymphocytes Relative: 31 %
Lymphs Abs: 1 10*3/uL (ref 0.7–4.0)
MCH: 30.1 pg (ref 26.0–34.0)
MCHC: 31.9 g/dL (ref 30.0–36.0)
MCV: 94.3 fL (ref 80.0–100.0)
Monocytes Absolute: 0.6 10*3/uL (ref 0.1–1.0)
Monocytes Relative: 21 %
Neutro Abs: 1.4 10*3/uL — ABNORMAL LOW (ref 1.7–7.7)
Neutrophils Relative %: 45 %
Platelets: 208 10*3/uL (ref 150–400)
RBC: 4.39 MIL/uL (ref 4.22–5.81)
RDW: 13.7 % (ref 11.5–15.5)
WBC: 3 10*3/uL — ABNORMAL LOW (ref 4.0–10.5)
nRBC: 0 % (ref 0.0–0.2)

## 2019-01-06 LAB — COMPREHENSIVE METABOLIC PANEL
ALT: 15 U/L (ref 0–44)
AST: 18 U/L (ref 15–41)
Albumin: 3.8 g/dL (ref 3.5–5.0)
Alkaline Phosphatase: 47 U/L (ref 38–126)
Anion gap: 7 (ref 5–15)
BUN: 25 mg/dL — ABNORMAL HIGH (ref 8–23)
CO2: 23 mmol/L (ref 22–32)
Calcium: 9.2 mg/dL (ref 8.9–10.3)
Chloride: 108 mmol/L (ref 98–111)
Creatinine, Ser: 1.48 mg/dL — ABNORMAL HIGH (ref 0.61–1.24)
GFR calc Af Amer: 53 mL/min — ABNORMAL LOW (ref >60)
GFR calc non Af Amer: 46 mL/min — ABNORMAL LOW (ref >60)
Glucose, Bld: 99 mg/dL (ref 70–99)
Potassium: 4.6 mmol/L (ref 3.5–5.1)
Sodium: 138 mmol/L (ref 135–145)
Total Bilirubin: 0.5 mg/dL (ref 0.3–1.2)
Total Protein: 8 g/dL (ref 6.5–8.1)

## 2019-01-06 MED ORDER — ZOLEDRONIC ACID 4 MG/5ML IV CONC
3.0000 mg | Freq: Once | INTRAVENOUS | Status: AC
  Administered 2019-01-06: 3 mg via INTRAVENOUS
  Filled 2019-01-06: qty 3.75

## 2019-01-06 MED ORDER — SODIUM CHLORIDE 0.9 % IV SOLN
INTRAVENOUS | Status: DC
  Administered 2019-01-06: 14:00:00 via INTRAVENOUS
  Filled 2019-01-06: qty 250

## 2019-01-06 NOTE — Assessment & Plan Note (Addendum)
#  Multiple myeloma-most recently on maintenance Revlimid 15 mg 3 weeks on 1 week off.  SEP 2020: NEG SPEP however, slowly rising kappa lambda light chain ratio is 1.65.STABLE.   #On Revlimid 15 mg 2 weeks on, 2 weeks off- tolerating well;   # CKD-III- GFR 40s-50s (baseline creatinine 1.3-1.4; peak 1.8). STABLE.   #Bone modifying agent/multiple myeloma-Zometa has been held due to renal dysfunction. on zometa   Disposition: # Proceed with Zometa 3 mg today # Follow up in 4 weeks-MD;  labs (CBC, CMP, kappa lambda light chains, multiple myeloma panel),  and possible Zometa.

## 2019-01-06 NOTE — Progress Notes (Signed)
[place] OFFICE PROGRESS NOTE  Patient Care Team: Baxter Hire, MD as PCP - General (Internal Medicine) Druscilla Brownie, MD as Consulting Physician (Dermatology)  Cancer Staging No matching staging information was found for the patient.   Oncology History Overview Note   # April 2014- MULTIPLE MYELOMA [IgG- 2.2gm/dl; 40-50% plasma cell; Cyto-N; FISH- gain of chr.9, 15 & ccnd1/11q13] s/p RVD x6 [sep 2014; BMBx- ~5% plasma cells]; Maint Rev-DEX-Zometa; March 2015-Stopped Dex Cont- Rev-Zometa; July 2016- Rev 49m; NOV 4th  2016-HOLD[facility] declined B[facility].   # Re-START FEB 2017 Rev 138m3 W- on & 1 w OFF; HELD Rev June- Oct 2020- sec to worsening renal function [Dr.Singh]; SEP 2020- Rev 15 mg 2 w-On & 2 w-OFF.   # Zometa  # CKD [creat ~1.4; sec MM; Dr.Singh]; DM  -------------------------------------------------------------------------   DIAGNOSIS: [2[name] MULTIPLE MYELOMA  GOALS: control  CURRENT/MOST RECENT THERAPY- REVLIMID Maintenance [Feb 2017]    Multiple myeloma in remission ([facility] 10/01/2015 Initial Diagnosis   Multiple myeloma in remission (HCH[place]      INTERVAL HISTORY:  Alan Aloia89.o.  male pleasant patient above history of multiple myeloma most recently on Revlimid maintenance is here for follow-up.  Revlimid on hold since June because of worsening renal function.  Patient restarted back on Revlimid in the month of October.  He is currently on 15 mg 2 weeks on 1 week off.  Patient appetite is good.  No weight loss.  No nausea no vomiting.  No chest pain or shortness of breath cough.  No worsening swelling in legs.   Review of Systems  Constitutional: Negative for chills, diaphoresis, fever, malaise/fatigue and weight loss.  HENT: Negative for nosebleeds and sore throat.   Eyes: Negative for double vision.  Respiratory: Negative for cough, hemoptysis, sputum production, shortness of breath and wheezing.    Cardiovascular: Negative for chest pain, palpitations, orthopnea and leg swelling.  Gastrointestinal: Negative for abdominal pain, blood in stool, constipation, diarrhea, heartburn, melena, nausea and vomiting.  Genitourinary: Negative for dysuria, frequency and urgency.  Musculoskeletal: Negative for back pain and joint pain.  Skin: Negative.  Negative for itching and rash.  Neurological: Negative for dizziness, tingling, focal weakness, weakness and headaches.  Endo/Heme/Allergies: Does not bruise/bleed easily.  Psychiatric/Behavioral: Negative for depression. The patient is not nervous/anxious and does not have insomnia.       PAST MEDICAL HISTORY :  Past Medical History:  Diagnosis Date  . Diabetes mellitus without complication (HCFreet[place]  . GERD (gastroesophageal reflux disease)   . Hyperlipidemia   . Hypertension   . Multiple myeloma (HCHills[place]  . Obesity   . Thyroid cancer (HCC[place]  . Thyroid disease     PAST SURGICAL HISTORY :   Past Surgical History:  Procedure Laterality Date  . BONE MARROW BIOPSY  2014  . CATARACT EXTRACTION BILATERAL W/ ANTERIOR VITRECTOMY      FAMILY HISTORY :   Family History  Problem Relation Age of Onset  . Cancer Father [id number]. Diabetes Mother     SOCIAL HISTORY:   Social History   Tobacco Use  . Smoking status: Never Smoker  . Smokeless tobacco: Never Used  Substance Use Topics  . Alcohol use: No  . Drug use: No    ALLERGIES:  has No Known Allergies.  MEDICATIONS:  Current Outpatient Medications  Medication Sig Dispense Refill  . amLODipine (NORVASC) 10 MG tablet Take 10 mg  by mouth daily.    [name] aspirin EC 81 MG tablet Take 81 mg by mouth daily.    [name] atorvastatin (LIPITOR) 20 MG tablet Take 20 mg by mouth daily.    . baclofen (LIORESAL) 10 MG tablet Take 0.5 tablets (5 mg total) by mouth 3 (three) times daily. 30 each 0  . cloNIDine (CATAPRES) 0.1 MG tablet Take 0.1 mg by mouth 2 (two) times daily.    . Cyanocobalamin (RA VITAMIN B-12  TR) 1000 MCG TBCR Take by mouth.    . levothyroxine (SYNTHROID) 137 MCG tablet Take 1 tablet by mouth daily.     [name] DELICA LANCETS 62M MISC Inject 1 Lancet as directed once daily.    . pantoprazole (PROTONIX) 40 MG tablet TAKE 1 TABLET BY MOUTH EVERY DAY 60 tablet 3  . pioglitazone (ACTOS) 30 MG tablet Take 30 mg by mouth daily.    . potassium chloride SA (K-DUR,KLOR-CON) 20 MEQ tablet Take 20 mEq by mouth 2 (two) times daily.   0  . quinapril (ACCUPRIL) 40 MG tablet Take 40 mg by mouth daily.    [name] lenalidomide (REVLIMID) 15 MG capsule Take 1 capsule (15 mg total) by mouth daily for 2 weeks, then hold for 2 weeks off (Patient not taking: Reported on 01/01/2019) 14 capsule 0   No current facility-administered medications for this visit.    Facility-Administered Medications Ordered in Other Visits  Medication Dose Route Frequency Provider Last Rate Last Dose  . 0.9 %  sodium chloride infusion   Intravenous Continuous Cammie Sickle, MD   Stopped at 12/11/14 1528  . 0.9 %  sodium chloride infusion   Intravenous Continuous Cammie Sickle, MD   Stopped at 01/06/19 1419    PHYSICAL EXAMINATION: ECOG PERFORMANCE STATUS: 0 - Asymptomatic  BP 136/69 (BP Location: Left Arm, Patient Position: Sitting, Cuff Size: Large)   Pulse (!) 57   Temp 98.1 F (36.7 C) (Tympanic)   Resp 20   Ht _0  (1.778 m)   Wt 233 lb (105.7 kg)   BMI 33.43 kg/m   Filed Weights   01/06/19 1257  Weight: 233 lb (105.7 kg)    Physical Exam  Constitutional: He is oriented to person, place, and time and well-developed, well-nourished, and in no distress.  HENT:  Head: Normocephalic and atraumatic.  Mouth/Throat: Oropharynx is clear and moist. No oropharyngeal exudate.  Eyes: Pupils are equal, round, and reactive to light.  Neck: Normal range of motion. Neck supple.  Cardiovascular: Normal rate and regular rhythm.  Pulmonary/Chest: No respiratory distress. He has no wheezes.  Abdominal:  Soft. Bowel sounds are normal. He exhibits no distension and no mass. There is no abdominal tenderness. There is no rebound and no guarding.  Musculoskeletal: Normal range of motion.        General: No tenderness or edema.  Neurological: He is alert and oriented to person, place, and time.  Skin: Skin is warm.  Psychiatric: Affect normal.      LABORATORY DATA:  I have reviewed the data as listed    Component Value Date/Time   NA 138 01/06/2019 1252   NA 139 05/01/2014 1322   K 4.6 01/06/2019 1252   K 4.4 05/01/2014 1322   CL 108 01/06/2019 1252   CL 107 05/01/2014 1322   CO2 23 01/06/2019 1252   CO2 27 05/01/2014 1322   GLUCOSE 99 01/06/2019 1252   GLUCOSE 102 (H) 05/01/2014 1322   BUN 25 (H) 01/06/2019 1252  BUN 16 05/01/2014 1322   CREATININE 1.48 (H) 01/06/2019 1252   CREATININE 1.48 (H) 05/29/2014 0949   CALCIUM 9.2 01/06/2019 1252   CALCIUM 9.1 05/01/2014 1322   PROT 8.0 01/06/2019 1252   PROT 7.5 05/01/2014 1322   ALBUMIN 3.8 01/06/2019 1252   ALBUMIN 3.6 05/01/2014 1322   AST 18 01/06/2019 1252   AST 17 05/01/2014 1322   ALT 15 01/06/2019 1252   ALT 13 (L) 05/01/2014 1322   ALKPHOS 47 01/06/2019 1252   ALKPHOS 47 05/01/2014 1322   BILITOT 0.5 01/06/2019 1252   BILITOT 0.6 05/01/2014 1322   GFRNONAA 46 (L) 01/06/2019 1252   GFRNONAA 47 (L) 05/29/2014 0949   GFRAA 53 (L) 01/06/2019 1252   GFRAA 55 (L) 05/29/2014 0949    No results found for: SPEP, UPEP  Lab Results  Component Value Date   WBC 3.0 (L) 01/06/2019   NEUTROABS 1.4 (L) 01/06/2019   HGB 13.2 01/06/2019   HCT 41.4 01/06/2019   MCV 94.3 01/06/2019   PLT 208 01/06/2019      Chemistry      Component Value Date/Time   NA 138 01/06/2019 1252   NA 139 05/01/2014 1322   K 4.6 01/06/2019 1252   K 4.4 05/01/2014 1322   CL 108 01/06/2019 1252   CL 107 05/01/2014 1322   CO2 23 01/06/2019 1252   CO2 27 05/01/2014 1322   BUN 25 (H) 01/06/2019 1252   BUN 16 05/01/2014 1322   CREATININE 1.48  (H) 01/06/2019 1252   CREATININE 1.48 (H) 05/29/2014 0949      Component Value Date/Time   CALCIUM 9.2 01/06/2019 1252   CALCIUM 9.1 05/01/2014 1322   ALKPHOS 47 01/06/2019 1252   ALKPHOS 47 05/01/2014 1322   AST 18 01/06/2019 1252   AST 17 05/01/2014 1322   ALT 15 01/06/2019 1252   ALT 13 (L) 05/01/2014 1322   BILITOT 0.5 01/06/2019 1252   BILITOT 0.6 05/01/2014 1322       RADIOGRAPHIC STUDIES: I have personally reviewed the radiological images as listed and agreed with the findings in the report. No results found.   ASSESSMENT & PLAN:  Multiple myeloma in remission ([place]) #Multiple myeloma-most recently on maintenance Revlimid 15 mg 3 weeks on 1 week off.  SEP 2020: NEG SPEP however, slowly rising kappa lambda light chain ratio is 1.65.STABLE.   #On Revlimid 15 mg 2 weeks on, 2 weeks off- tolerating well;   # CKD-III- GFR 40s-50s (baseline creatinine 1.3-1.4; peak 1.8). STABLE.   #Bone modifying agent/multiple myeloma-Zometa has been held due to renal dysfunction. on zometa   Disposition: # Proceed with Zometa 3 mg today # Follow up in 4 weeks-MD;  labs (CBC, CMP, kappa lambda light chains, multiple myeloma panel),  and possible Zometa.    No orders of the defined types were placed in this encounter.  All questions were answered. The patient knows to call the clinic with any problems, questions or concerns.      Cammie Sickle, MD 01/06/2019 4:15 PM

## 2019-01-07 ENCOUNTER — Other Ambulatory Visit: Payer: Self-pay | Admitting: *Deleted

## 2019-01-07 DIAGNOSIS — C9001 Multiple myeloma in remission: Secondary | ICD-10-CM

## 2019-01-07 LAB — MULTIPLE MYELOMA PANEL, SERUM
Albumin SerPl Elph-Mcnc: 3.8 g/dL (ref 2.9–4.4)
Albumin/Glob SerPl: 1.1 (ref 0.7–1.7)
Alpha 1: 0.2 g/dL (ref 0.0–0.4)
Alpha2 Glob SerPl Elph-Mcnc: 0.6 g/dL (ref 0.4–1.0)
B-Globulin SerPl Elph-Mcnc: 1.2 g/dL (ref 0.7–1.3)
Gamma Glob SerPl Elph-Mcnc: 1.4 g/dL (ref 0.4–1.8)
Globulin, Total: 3.5 g/dL (ref 2.2–3.9)
IgA: 474 mg/dL — ABNORMAL HIGH (ref 61–437)
IgG (Immunoglobin G), Serum: 1576 mg/dL (ref 603–1613)
IgM (Immunoglobulin M), Srm: 31 mg/dL (ref 15–143)
Total Protein ELP: 7.3 g/dL (ref 6.0–8.5)

## 2019-01-07 LAB — KAPPA/LAMBDA LIGHT CHAINS
Kappa free light chain: 62.5 mg/L — ABNORMAL HIGH (ref 3.3–19.4)
Kappa, lambda light chain ratio: 1.9 — ABNORMAL HIGH (ref 0.26–1.65)
Lambda free light chains: 32.9 mg/L — ABNORMAL HIGH (ref 5.7–26.3)

## 2019-01-07 MED ORDER — LENALIDOMIDE 15 MG PO CAPS: ORAL_CAPSULE | ORAL | 0 refills | Status: DC

## 2019-02-03 ENCOUNTER — Inpatient Hospital Stay (HOSPITAL_BASED_OUTPATIENT_CLINIC_OR_DEPARTMENT_OTHER): Payer: Medicare HMO | Admitting: Internal Medicine

## 2019-02-03 ENCOUNTER — Other Ambulatory Visit: Payer: Self-pay

## 2019-02-03 ENCOUNTER — Inpatient Hospital Stay: Payer: Medicare HMO | Attending: Internal Medicine

## 2019-02-03 ENCOUNTER — Encounter: Payer: Self-pay | Admitting: Internal Medicine

## 2019-02-03 ENCOUNTER — Inpatient Hospital Stay: Payer: Medicare HMO

## 2019-02-03 VITALS — Resp 20

## 2019-02-03 DIAGNOSIS — Z7982 Long term (current) use of aspirin: Secondary | ICD-10-CM | POA: Insufficient documentation

## 2019-02-03 DIAGNOSIS — E785 Hyperlipidemia, unspecified: Secondary | ICD-10-CM | POA: Insufficient documentation

## 2019-02-03 DIAGNOSIS — Z7984 Long term (current) use of oral hypoglycemic drugs: Secondary | ICD-10-CM | POA: Insufficient documentation

## 2019-02-03 DIAGNOSIS — K219 Gastro-esophageal reflux disease without esophagitis: Secondary | ICD-10-CM | POA: Diagnosis not present

## 2019-02-03 DIAGNOSIS — E1122 Type 2 diabetes mellitus with diabetic chronic kidney disease: Secondary | ICD-10-CM | POA: Diagnosis not present

## 2019-02-03 DIAGNOSIS — C9001 Multiple myeloma in remission: Secondary | ICD-10-CM

## 2019-02-03 DIAGNOSIS — N183 Chronic kidney disease, stage 3 unspecified: Secondary | ICD-10-CM | POA: Insufficient documentation

## 2019-02-03 DIAGNOSIS — Z79899 Other long term (current) drug therapy: Secondary | ICD-10-CM | POA: Insufficient documentation

## 2019-02-03 DIAGNOSIS — Z85850 Personal history of malignant neoplasm of thyroid: Secondary | ICD-10-CM | POA: Insufficient documentation

## 2019-02-03 DIAGNOSIS — I129 Hypertensive chronic kidney disease with stage 1 through stage 4 chronic kidney disease, or unspecified chronic kidney disease: Secondary | ICD-10-CM | POA: Insufficient documentation

## 2019-02-03 LAB — CBC WITH DIFFERENTIAL/PLATELET
Abs Immature Granulocytes: 0.01 10*3/uL (ref 0.00–0.07)
Basophils Absolute: 0 10*3/uL (ref 0.0–0.1)
Basophils Relative: 1 %
Eosinophils Absolute: 0.1 10*3/uL (ref 0.0–0.5)
Eosinophils Relative: 3 %
HCT: 41.7 % (ref 39.0–52.0)
Hemoglobin: 13 g/dL (ref 13.0–17.0)
Immature Granulocytes: 0 %
Lymphocytes Relative: 37 %
Lymphs Abs: 1.4 10*3/uL (ref 0.7–4.0)
MCH: 29.5 pg (ref 26.0–34.0)
MCHC: 31.2 g/dL (ref 30.0–36.0)
MCV: 94.8 fL (ref 80.0–100.0)
Monocytes Absolute: 0.7 10*3/uL (ref 0.1–1.0)
Monocytes Relative: 17 %
Neutro Abs: 1.6 10*3/uL — ABNORMAL LOW (ref 1.7–7.7)
Neutrophils Relative %: 42 %
Platelets: 161 10*3/uL (ref 150–400)
RBC: 4.4 MIL/uL (ref 4.22–5.81)
RDW: 14.3 % (ref 11.5–15.5)
WBC: 3.8 10*3/uL — ABNORMAL LOW (ref 4.0–10.5)
nRBC: 0 % (ref 0.0–0.2)

## 2019-02-03 LAB — COMPREHENSIVE METABOLIC PANEL
ALT: 16 U/L (ref 0–44)
AST: 18 U/L (ref 15–41)
Albumin: 3.8 g/dL (ref 3.5–5.0)
Alkaline Phosphatase: 46 U/L (ref 38–126)
Anion gap: 4 — ABNORMAL LOW (ref 5–15)
BUN: 21 mg/dL (ref 8–23)
CO2: 25 mmol/L (ref 22–32)
Calcium: 8.9 mg/dL (ref 8.9–10.3)
Chloride: 109 mmol/L (ref 98–111)
Creatinine, Ser: 1.34 mg/dL — ABNORMAL HIGH (ref 0.61–1.24)
GFR calc Af Amer: 60 mL/min — ABNORMAL LOW (ref >60)
GFR calc non Af Amer: 51 mL/min — ABNORMAL LOW (ref >60)
Glucose, Bld: 121 mg/dL — ABNORMAL HIGH (ref 70–99)
Potassium: 4.5 mmol/L (ref 3.5–5.1)
Sodium: 138 mmol/L (ref 135–145)
Total Bilirubin: 0.6 mg/dL (ref 0.3–1.2)
Total Protein: 7.6 g/dL (ref 6.5–8.1)

## 2019-02-03 MED ORDER — ZOLEDRONIC ACID 4 MG/5ML IV CONC
3.0000 mg | Freq: Once | INTRAVENOUS | Status: AC
  Administered 2019-02-03: 3 mg via INTRAVENOUS
  Filled 2019-02-03: qty 3.75

## 2019-02-03 MED ORDER — SODIUM CHLORIDE 0.9 % IV SOLN
Freq: Once | INTRAVENOUS | Status: AC
  Filled 2019-02-03: qty 250

## 2019-02-03 NOTE — Assessment & Plan Note (Addendum)
#  Multiple myeloma-most recently on maintenance Revlimid 15 mg 3 weeks on 1 week off.  SEP 2020: NEG NOV  however, slowly rising kappa lambda light chain ratio is 1.99-STABLE. . stable.   #On Revlimid 15 mg 2 weeks on, 2 weeks off- tolerating well; continue the same.  # CKD-III- GFR 40s-50s (baseline creatinine 1.3-1.4; peak 1.8).  Stable.  Follows with nephrology next month.  #Bone modifying agent/multiple myeloma-- on zometa 60m q  [place].   Disposition: # Proceed with Zometa 3 mg today # Follow up in 4 weeks-MD;  labs (CBC, CMP, kappa lambda light chains, multiple myeloma panel),  and possible Zometa.

## 2019-02-03 NOTE — Progress Notes (Signed)
[place] OFFICE PROGRESS NOTE  Patient Care Team: Baxter Hire, MD as PCP - General (Internal Medicine) Druscilla Brownie, MD as Consulting Physician (Dermatology)  Cancer Staging No matching staging information was found for the patient.   Oncology History Overview Note   # April 2014- MULTIPLE MYELOMA [IgG- 2.2gm/dl; 40-50% plasma cell; Cyto-N; FISH- gain of chr.9, 15 & ccnd1/11q13] s/p RVD x6 [sep 2014; BMBx- ~5% plasma cells]; Maint Rev-DEX-Zometa; March 2015-Stopped Dex Cont- Rev-Zometa; July 2016- Rev 3m; NOV 4th  2016-HOLD; [facility] declined BMT.   # Re-START FEB 2017 Rev 138m3 W- on & 1 w OFF; HELD Rev June- Oct 2020- sec to worsening renal function [Dr.Singh]; SEP 2020- Rev 15 mg 2 w-On & 2 w-OFF.   # Zometa  # CKD [creat ~1.4; sec MM; Dr.Singh]; DM  -------------------------------------------------------------------------   DIAGNOSIS: [2[name] MULTIPLE MYELOMA  GOALS: control  CURRENT/MOST RECENT THERAPY- REVLIMID Maintenance [Feb 2017]    Multiple myeloma in remission ([place] 10/01/2015 Initial Diagnosis   Multiple myeloma in remission ([facility]      INTERVAL HISTORY:  ThRaven Harmes89.o.  male pleasant patient above history of multiple myeloma most recently on Revlimid maintenance is here for follow-up.  Patient appetite is good.  No weight loss.  No nausea no vomiting.  No chest pain or shortness of breath cough.  No worsening swelling in legs.   Review of Systems  Constitutional: Negative for chills, diaphoresis, fever, malaise/fatigue and weight loss.  HENT: Negative for nosebleeds and sore throat.   Eyes: Negative for double vision.  Respiratory: Negative for cough, hemoptysis, sputum production, shortness of breath and wheezing.   Cardiovascular: Negative for chest pain, palpitations, orthopnea and leg swelling.  Gastrointestinal: Negative for abdominal pain, blood in stool, constipation, diarrhea, heartburn,  melena, nausea and vomiting.  Genitourinary: Negative for dysuria, frequency and urgency.  Musculoskeletal: Negative for back pain and joint pain.  Skin: Negative.  Negative for itching and rash.  Neurological: Negative for dizziness, tingling, focal weakness, weakness and headaches.  Endo/Heme/Allergies: Does not bruise/bleed easily.  Psychiatric/Behavioral: Negative for depression. The patient is not nervous/anxious and does not have insomnia.       PAST MEDICAL HISTORY :  Past Medical History:  Diagnosis Date  . Diabetes mellitus without complication ([facility]  . GERD (gastroesophageal reflux disease)   . Hyperlipidemia   . Hypertension   . Multiple myeloma ([facility]  . Obesity   . Thyroid cancer ([facility]  . Thyroid disease     PAST SURGICAL HISTORY :   Past Surgical History:  Procedure Laterality Date  . BONE MARROW BIOPSY  2014  . CATARACT EXTRACTION BILATERAL W/ ANTERIOR VITRECTOMY      FAMILY HISTORY :   Family History  Problem Relation Age of Onset  . Cancer Father 6329. Diabetes Mother     SOCIAL HISTORY:   Social History   Tobacco Use  . Smoking status: Never Smoker  . Smokeless tobacco: Never Used  Substance Use Topics  . Alcohol use: No  . Drug use: No    ALLERGIES:  has No Known Allergies.  MEDICATIONS:  Current Outpatient Medications  Medication Sig Dispense Refill  . amLODipine (NORVASC) 10 MG tablet Take 10 mg by mouth daily.    . Marland Kitchenspirin EC 81 MG tablet Take 81 mg by mouth daily.    . Marland Kitchentorvastatin (LIPITOR) 20 MG tablet Take 20 mg by mouth daily.    .[name]  baclofen (LIORESAL) 10 MG tablet Take 0.5 tablets (5 mg total) by mouth 3 (three) times daily. 30 each 0  . cloNIDine (CATAPRES) 0.1 MG tablet Take 0.1 mg by mouth 2 (two) times daily.    . Cyanocobalamin (RA VITAMIN B-12 TR) 1000 MCG TBCR Take by mouth.    . lenalidomide (REVLIMID) 15 MG capsule Take 1 capsule (15 mg total) by mouth daily for 2 weeks, then hold for 2 weeks off 14 capsule 0  .  levothyroxine (SYNTHROID) 137 MCG tablet Take 1 tablet by mouth daily.     [name] DELICA LANCETS 16W MISC Inject 1 Lancet as directed once daily.    . pantoprazole (PROTONIX) 40 MG tablet TAKE 1 TABLET BY MOUTH EVERY DAY 60 tablet 3  . pioglitazone (ACTOS) 30 MG tablet Take 30 mg by mouth daily.    . potassium chloride SA (K-DUR,KLOR-CON) 20 MEQ tablet Take 20 mEq by mouth 2 (two) times daily.   0  . quinapril (ACCUPRIL) 40 MG tablet Take 40 mg by mouth daily.     No current facility-administered medications for this visit.   Facility-Administered Medications Ordered in Other Visits  Medication Dose Route Frequency Provider Last Rate Last Admin  . 0.9 %  sodium chloride infusion   Intravenous Continuous Cammie Sickle, MD   Stopped at 12/11/14 1528    PHYSICAL EXAMINATION: ECOG PERFORMANCE STATUS: 0 - Asymptomatic  BP (!) 147/79 (BP Location: Left Arm, Patient Position: Sitting, Cuff Size: Large)   Pulse (!) 55   Temp (!) 97.5 F (36.4 C) (Tympanic)   Wt 239 lb (108.4 kg)   BMI 34.29 kg/m   Filed Weights   02/03/19 1422  Weight: 239 lb (108.4 kg)    Physical Exam  Constitutional: He is oriented to person, place, and time and well-developed, well-nourished, and in no distress.  HENT:  Head: Normocephalic and atraumatic.  Mouth/Throat: Oropharynx is clear and moist. No oropharyngeal exudate.  Eyes: Pupils are equal, round, and reactive to light.  Cardiovascular: Normal rate and regular rhythm.  Pulmonary/Chest: No respiratory distress. He has no wheezes.  Abdominal: Soft. Bowel sounds are normal. He exhibits no distension and no mass. There is no abdominal tenderness. There is no rebound and no guarding.  Musculoskeletal:        General: No tenderness or edema. Normal range of motion.     Cervical back: Normal range of motion and neck supple.  Neurological: He is alert and oriented to person, place, and time.  Skin: Skin is warm.  Psychiatric: Affect normal.       LABORATORY DATA:  I have reviewed the data as listed    Component Value Date/Time   NA 138 02/03/2019 1356   NA 139 05/01/2014 1322   K 4.5 02/03/2019 1356   K 4.4 05/01/2014 1322   CL 109 02/03/2019 1356   CL 107 05/01/2014 1322   CO2 25 02/03/2019 1356   CO2 27 05/01/2014 1322   GLUCOSE 121 (H) 02/03/2019 1356   GLUCOSE 102 (H) 05/01/2014 1322   BUN 21 02/03/2019 1356   BUN 16 05/01/2014 1322   CREATININE 1.34 (H) 02/03/2019 1356   CREATININE 1.48 (H) 05/29/2014 0949   CALCIUM 8.9 02/03/2019 1356   CALCIUM 9.1 05/01/2014 1322   PROT 7.6 02/03/2019 1356   PROT 7.5 05/01/2014 1322   ALBUMIN 3.8 02/03/2019 1356   ALBUMIN 3.6 05/01/2014 1322   AST 18 02/03/2019 1356   AST 17 05/01/2014 1322   ALT  16 02/03/2019 1356   ALT 13 (L) 05/01/2014 1322   ALKPHOS 46 02/03/2019 1356   ALKPHOS 47 05/01/2014 1322   BILITOT 0.6 02/03/2019 1356   BILITOT 0.6 05/01/2014 1322   GFRNONAA 51 (L) 02/03/2019 1356   GFRNONAA 47 (L) 05/29/2014 0949   GFRAA 60 (L) 02/03/2019 1356   GFRAA 55 (L) 05/29/2014 0949    No results found for: SPEP, UPEP  Lab Results  Component Value Date   WBC 3.8 (L) 02/03/2019   NEUTROABS 1.6 (L) 02/03/2019   HGB 13.0 02/03/2019   HCT 41.7 02/03/2019   MCV 94.8 02/03/2019   PLT 161 02/03/2019      Chemistry      Component Value Date/Time   NA 138 02/03/2019 1356   NA 139 05/01/2014 1322   K 4.5 02/03/2019 1356   K 4.4 05/01/2014 1322   CL 109 02/03/2019 1356   CL 107 05/01/2014 1322   CO2 25 02/03/2019 1356   CO2 27 05/01/2014 1322   BUN 21 02/03/2019 1356   BUN 16 05/01/2014 1322   CREATININE 1.34 (H) 02/03/2019 1356   CREATININE 1.48 (H) 05/29/2014 0949      Component Value Date/Time   CALCIUM 8.9 02/03/2019 1356   CALCIUM 9.1 05/01/2014 1322   ALKPHOS 46 02/03/2019 1356   ALKPHOS 47 05/01/2014 1322   AST 18 02/03/2019 1356   AST 17 05/01/2014 1322   ALT 16 02/03/2019 1356   ALT 13 (L) 05/01/2014 1322   BILITOT 0.6 02/03/2019  1356   BILITOT 0.6 05/01/2014 1322       RADIOGRAPHIC STUDIES: I have personally reviewed the radiological images as listed and agreed with the findings in the report. No results found.   ASSESSMENT & PLAN:  Multiple myeloma in remission ([place]) #Multiple myeloma-most recently on maintenance Revlimid 15 mg 3 weeks on 1 week off.  SEP 2020: NEG NOV  however, slowly rising kappa lambda light chain ratio is 1.99-STABLE. . stable.   #On Revlimid 15 mg 2 weeks on, 2 weeks off- tolerating well; continue the same.  # CKD-III- GFR 40s-50s (baseline creatinine 1.3-1.4; peak 1.8).  Stable.  Follows with nephrology next month.  #Bone modifying agent/multiple myeloma-- on zometa 39m q  Monthy.   Disposition: # Proceed with Zometa 3 mg today # Follow up in 4 weeks-MD;  labs (CBC, CMP, kappa lambda light chains, multiple myeloma panel),  and possible Zometa.    Orders Placed This Encounter  Procedures  . CBC with Differential    Standing Status:   Future    Standing Expiration Date:   02/03/2020  . Comprehensive metabolic panel    Standing Status:   Future    Standing Expiration Date:   02/03/2020  . Multiple Myeloma Panel (SPEP&IFE w/QIG)    Standing Status:   Future    Standing Expiration Date:   02/03/2020  . Kappa/lambda light chains    Standing Status:   Future    Standing Expiration Date:   02/03/2020   All questions were answered. The patient knows to call the clinic with any problems, questions or concerns.      GCammie Sickle MD 02/03/2019 4:06 PM

## 2019-02-04 ENCOUNTER — Other Ambulatory Visit: Payer: Self-pay | Admitting: *Deleted

## 2019-02-04 DIAGNOSIS — C9001 Multiple myeloma in remission: Secondary | ICD-10-CM

## 2019-02-04 MED ORDER — LENALIDOMIDE 15 MG PO CAPS: ORAL_CAPSULE | ORAL | 0 refills | Status: DC

## 2019-03-03 DIAGNOSIS — E1122 Type 2 diabetes mellitus with diabetic chronic kidney disease: Secondary | ICD-10-CM | POA: Insufficient documentation

## 2019-03-04 ENCOUNTER — Inpatient Hospital Stay: Payer: Medicare HMO

## 2019-03-04 ENCOUNTER — Other Ambulatory Visit: Payer: Self-pay

## 2019-03-04 ENCOUNTER — Inpatient Hospital Stay: Payer: Medicare HMO | Attending: Internal Medicine

## 2019-03-04 ENCOUNTER — Inpatient Hospital Stay: Payer: Medicare HMO | Admitting: Internal Medicine

## 2019-03-04 ENCOUNTER — Other Ambulatory Visit: Payer: Self-pay | Admitting: *Deleted

## 2019-03-04 DIAGNOSIS — Z809 Family history of malignant neoplasm, unspecified: Secondary | ICD-10-CM | POA: Insufficient documentation

## 2019-03-04 DIAGNOSIS — N183 Chronic kidney disease, stage 3 unspecified: Secondary | ICD-10-CM | POA: Insufficient documentation

## 2019-03-04 DIAGNOSIS — Z7984 Long term (current) use of oral hypoglycemic drugs: Secondary | ICD-10-CM | POA: Insufficient documentation

## 2019-03-04 DIAGNOSIS — Z79899 Other long term (current) drug therapy: Secondary | ICD-10-CM | POA: Insufficient documentation

## 2019-03-04 DIAGNOSIS — K219 Gastro-esophageal reflux disease without esophagitis: Secondary | ICD-10-CM | POA: Diagnosis not present

## 2019-03-04 DIAGNOSIS — C9001 Multiple myeloma in remission: Secondary | ICD-10-CM

## 2019-03-04 DIAGNOSIS — Z85850 Personal history of malignant neoplasm of thyroid: Secondary | ICD-10-CM | POA: Insufficient documentation

## 2019-03-04 DIAGNOSIS — E785 Hyperlipidemia, unspecified: Secondary | ICD-10-CM | POA: Diagnosis not present

## 2019-03-04 DIAGNOSIS — Z7982 Long term (current) use of aspirin: Secondary | ICD-10-CM | POA: Insufficient documentation

## 2019-03-04 DIAGNOSIS — E119 Type 2 diabetes mellitus without complications: Secondary | ICD-10-CM | POA: Insufficient documentation

## 2019-03-04 DIAGNOSIS — Z833 Family history of diabetes mellitus: Secondary | ICD-10-CM | POA: Insufficient documentation

## 2019-03-04 DIAGNOSIS — I10 Essential (primary) hypertension: Secondary | ICD-10-CM | POA: Diagnosis not present

## 2019-03-04 LAB — CBC WITH DIFFERENTIAL/PLATELET
Abs Immature Granulocytes: 0.01 10*3/uL (ref 0.00–0.07)
Basophils Absolute: 0 10*3/uL (ref 0.0–0.1)
Basophils Relative: 1 %
Eosinophils Absolute: 0.1 10*3/uL (ref 0.0–0.5)
Eosinophils Relative: 3 %
HCT: 41 % (ref 39.0–52.0)
Hemoglobin: 12.8 g/dL — ABNORMAL LOW (ref 13.0–17.0)
Immature Granulocytes: 0 %
Lymphocytes Relative: 30 %
Lymphs Abs: 1 10*3/uL (ref 0.7–4.0)
MCH: 29.7 pg (ref 26.0–34.0)
MCHC: 31.2 g/dL (ref 30.0–36.0)
MCV: 95.1 fL (ref 80.0–100.0)
Monocytes Absolute: 0.7 10*3/uL (ref 0.1–1.0)
Monocytes Relative: 21 %
Neutro Abs: 1.5 10*3/uL — ABNORMAL LOW (ref 1.7–7.7)
Neutrophils Relative %: 45 %
Platelets: 160 10*3/uL (ref 150–400)
RBC: 4.31 MIL/uL (ref 4.22–5.81)
RDW: 14.8 % (ref 11.5–15.5)
WBC: 3.4 10*3/uL — ABNORMAL LOW (ref 4.0–10.5)
nRBC: 0 % (ref 0.0–0.2)

## 2019-03-04 LAB — COMPREHENSIVE METABOLIC PANEL
ALT: 18 U/L (ref 0–44)
AST: 20 U/L (ref 15–41)
Albumin: 3.8 g/dL (ref 3.5–5.0)
Alkaline Phosphatase: 49 U/L (ref 38–126)
Anion gap: 6 (ref 5–15)
BUN: 22 mg/dL (ref 8–23)
CO2: 24 mmol/L (ref 22–32)
Calcium: 8.9 mg/dL (ref 8.9–10.3)
Chloride: 109 mmol/L (ref 98–111)
Creatinine, Ser: 1.43 mg/dL — ABNORMAL HIGH (ref 0.61–1.24)
GFR calc Af Amer: 55 mL/min — ABNORMAL LOW (ref >60)
GFR calc non Af Amer: 48 mL/min — ABNORMAL LOW (ref >60)
Glucose, Bld: 103 mg/dL — ABNORMAL HIGH (ref 70–99)
Potassium: 4.6 mmol/L (ref 3.5–5.1)
Sodium: 139 mmol/L (ref 135–145)
Total Bilirubin: 0.4 mg/dL (ref 0.3–1.2)
Total Protein: 7.5 g/dL (ref 6.5–8.1)

## 2019-03-04 MED ORDER — ZOLEDRONIC ACID 4 MG/5ML IV CONC
3.0000 mg | Freq: Once | INTRAVENOUS | Status: AC
  Administered 2019-03-04: 3 mg via INTRAVENOUS
  Filled 2019-03-04: qty 3.75

## 2019-03-04 MED ORDER — SODIUM CHLORIDE 0.9 % IV SOLN
Freq: Once | INTRAVENOUS | Status: AC
  Filled 2019-03-04: qty 250

## 2019-03-04 MED ORDER — LENALIDOMIDE 15 MG PO CAPS: ORAL_CAPSULE | ORAL | 0 refills | Status: DC

## 2019-03-04 NOTE — Assessment & Plan Note (Addendum)
#  Multiple myeloma-most recently on maintenance Revlimid 15 mg 2  weeks on 2 week off.  SPEP 2020: NEG NOV  however, slowly rising kappa lambda light chain ratio is 1.99- STABLE.   # continue current dose/and schedule of revlimid-ANC is 1.5.   # CKD-III- GFR 40s-50s (baseline creatinine 1.3-1.4; peak 1.8).  Stable.  #Bone modifying agent/multiple myeloma-- on zometa '3mg'$  q  Monthy.  Stable.  Proceed with Zometa today.  # Elevated blood pressure-blood pressures in 160s to 170s.  Recommend keeping a log of his blood pressures.  Recommend follow-up with his PCP.  # I discussed regarding Covid-19 precautions.  I reviewed the vaccine effectiveness and potential side effects in detail.  Also discussed long-term effectiveness and safety profile are unclear at this time.  I discussed December, 2020 ASCO position statement-that all patients are recommended COVID-19 vaccinations [when available]-as long as they do not have allergy to components of the vaccine.  However, I think the benefits of the vaccination outweigh the potential risks. Re: ELMRA-15 vaccination.  For more information/scheduling recommend call [phone], 8:30am-4:30pm.   Disposition: # Proceed with Zometa 3 mg today # in 1 month- labs- cbc/cmp # Follow up in 2 MD;  labs (CBC, CMP, kappa lambda light chains, multiple myeloma panel),  and possible Zometa. Dr.B

## 2019-03-04 NOTE — Patient Instructions (Signed)
Re: COVID-19 vaccination.  For more information/scheduling recommend call [place] [facility] department- [phone], 8:30am-4:30pm.   

## 2019-03-04 NOTE — Progress Notes (Signed)
[facility] OFFICE PROGRESS NOTE  Patient Care Team: Baxter Hire, MD as PCP - General (Internal Medicine) Druscilla Brownie, MD as Consulting Physician (Dermatology) Cammie Sickle, MD as Consulting Physician (Hematology and Oncology)  Cancer Staging No matching staging information was found for the patient.   Oncology History Overview Note   # April 2014- MULTIPLE MYELOMA [IgG- 2.2gm/dl; 40-50% plasma cell; Cyto-N; FISH- gain of chr.9, 15 & ccnd1/11q13] s/p RVD x6 [sep 2014; BMBx- ~5% plasma cells]; Maint Rev-DEX-Zometa; March 2015-Stopped Dex Cont- Rev-Zometa; July 2016- Rev '25mg'$ ; NOV 4th  2016-HOLD; BMT eval at [facility] [Dr.Woods]; declined BMT.   # Re-START FEB 2017 Rev '15mg'$  3 W- on & 1 w OFF; HELD Rev June- Oct 2020- sec to worsening renal function [Dr.Singh]; SEP 2020- Rev 15 mg 2 w-On & 2 w-OFF.   # Zometa  # CKD [creat ~1.4; sec MM; Dr.Singh]; DM  -------------------------------------------------------------------------   DIAGNOSIS: [name] ] MULTIPLE MYELOMA  GOALS: control  CURRENT/MOST RECENT THERAPY- REVLIMID Maintenance [Feb 2017]    Multiple myeloma in remission ([place])  10/01/2015 Initial Diagnosis   Multiple myeloma in remission ([place])     INTERVAL HISTORY:  Alan Alexander 74 y.o.  male pleasant patient above history of multiple myeloma most recently on Revlimid maintenance is here for follow-up.  Patient denies any worsening fatigue but denies any bone pain.  Appetite is good.  No weight loss.  No swelling of the legs.  No tingling or numbness.  Review of Systems  Constitutional: Negative for chills, diaphoresis, fever, malaise/fatigue and weight loss.  HENT: Negative for nosebleeds and sore throat.   Eyes: Negative for double vision.  Respiratory: Negative for cough, hemoptysis, sputum production, shortness of breath and wheezing.   Cardiovascular: Negative for chest pain, palpitations, orthopnea and leg swelling.  Gastrointestinal:  Negative for abdominal pain, blood in stool, constipation, diarrhea, heartburn, melena, nausea and vomiting.  Genitourinary: Negative for dysuria, frequency and urgency.  Musculoskeletal: Negative for back pain and joint pain.  Skin: Negative.  Negative for itching and rash.  Neurological: Negative for dizziness, tingling, focal weakness, weakness and headaches.  Endo/Heme/Allergies: Does not bruise/bleed easily.  Psychiatric/Behavioral: Negative for depression. The patient is not nervous/anxious and does not have insomnia.       PAST MEDICAL HISTORY :  Past Medical History:  Diagnosis Date  . Diabetes mellitus without complication ([place])   . GERD (gastroesophageal reflux disease)   . Hyperlipidemia   . Hypertension   . Multiple myeloma ([place])   . Obesity   . Thyroid cancer ([place])   . Thyroid disease     PAST SURGICAL HISTORY :   Past Surgical History:  Procedure Laterality Date  . BONE MARROW BIOPSY  2014  . CATARACT EXTRACTION BILATERAL W/ ANTERIOR VITRECTOMY      FAMILY HISTORY :   Family History  Problem Relation Age of Onset  . Cancer Father 78  . Diabetes Mother     SOCIAL HISTORY:   Social History   Tobacco Use  . Smoking status: Never Smoker  . Smokeless tobacco: Never Used  Substance Use Topics  . Alcohol use: No  . Drug use: No    ALLERGIES:  has No Known Allergies.  MEDICATIONS:  Current Outpatient Medications  Medication Sig Dispense Refill  . amLODipine (NORVASC) 10 MG tablet Take 10 mg by mouth daily.    [name] aspirin EC 81 MG tablet Take 81 mg by mouth daily.    [name] atorvastatin (LIPITOR) 20 MG tablet  Take 20 mg by mouth daily.    . baclofen (LIORESAL) 10 MG tablet Take 0.5 tablets (5 mg total) by mouth 3 (three) times daily. 30 each 0  . cloNIDine (CATAPRES) 0.1 MG tablet Take 0.1 mg by mouth 2 (two) times daily.    . Cyanocobalamin (RA VITAMIN B-12 TR) 1000 MCG TBCR Take by mouth.    . lenalidomide (REVLIMID) 15 MG capsule Take 1 capsule (15 mg  total) by mouth daily for 2 weeks, then hold for 2 weeks off 14 capsule 0  . levothyroxine (SYNTHROID) 137 MCG tablet Take 1 tablet by mouth daily.     [name] DELICA LANCETS 40G MISC Inject 1 Lancet as directed once daily.    . pantoprazole (PROTONIX) 40 MG tablet TAKE 1 TABLET BY MOUTH EVERY DAY 60 tablet 3  . pioglitazone (ACTOS) 30 MG tablet Take 30 mg by mouth daily.    . potassium chloride SA (K-DUR,KLOR-CON) 20 MEQ tablet Take 20 mEq by mouth 2 (two) times daily.   0  . quinapril (ACCUPRIL) 40 MG tablet Take 40 mg by mouth daily.     No current facility-administered medications for this visit.   Facility-Administered Medications Ordered in Other Visits  Medication Dose Route Frequency Provider Last Rate Last Admin  . 0.9 %  sodium chloride infusion   Intravenous Continuous Cammie Sickle, MD   Stopped at 12/11/14 1528    PHYSICAL EXAMINATION: ECOG PERFORMANCE STATUS: 0 - Asymptomatic  BP (!) 164/77 (BP Location: Left Arm, Patient Position: Sitting, Cuff Size: Large)   Pulse (!) 57   Wt 234 lb (106.1 kg)   BMI 33.58 kg/m   Filed Weights   03/04/19 1428  Weight: 234 lb (106.1 kg)    Physical Exam  Constitutional: He is oriented to person, place, and time and well-developed, well-nourished, and in no distress.  HENT:  Head: Normocephalic and atraumatic.  Mouth/Throat: Oropharynx is clear and moist. No oropharyngeal exudate.  Eyes: Pupils are equal, round, and reactive to light.  Cardiovascular: Normal rate and regular rhythm.  Pulmonary/Chest: No respiratory distress. He has no wheezes.  Abdominal: Soft. Bowel sounds are normal. He exhibits no distension and no mass. There is no abdominal tenderness. There is no rebound and no guarding.  Musculoskeletal:        General: No tenderness or edema. Normal range of motion.     Cervical back: Normal range of motion and neck supple.  Neurological: He is alert and oriented to person, place, and time.  Skin: Skin is  warm.  Psychiatric: Affect normal.      LABORATORY DATA:  I have reviewed the data as listed    Component Value Date/Time   NA 139 03/04/2019 1410   NA 139 05/01/2014 1322   K 4.6 03/04/2019 1410   K 4.4 05/01/2014 1322   CL 109 03/04/2019 1410   CL 107 05/01/2014 1322   CO2 24 03/04/2019 1410   CO2 27 05/01/2014 1322   GLUCOSE 103 (H) 03/04/2019 1410   GLUCOSE 102 (H) 05/01/2014 1322   BUN 22 03/04/2019 1410   BUN 16 05/01/2014 1322   CREATININE 1.43 (H) 03/04/2019 1410   CREATININE 1.48 (H) 05/29/2014 0949   CALCIUM 8.9 03/04/2019 1410   CALCIUM 9.1 05/01/2014 1322   PROT 7.5 03/04/2019 1410   PROT 7.5 05/01/2014 1322   ALBUMIN 3.8 03/04/2019 1410   ALBUMIN 3.6 05/01/2014 1322   AST 20 03/04/2019 1410   AST 17 05/01/2014 1322  ALT 18 03/04/2019 1410   ALT 13 (L) 05/01/2014 1322   ALKPHOS 49 03/04/2019 1410   ALKPHOS 47 05/01/2014 1322   BILITOT 0.4 03/04/2019 1410   BILITOT 0.6 05/01/2014 1322   GFRNONAA 48 (L) 03/04/2019 1410   GFRNONAA 47 (L) 05/29/2014 0949   GFRAA 55 (L) 03/04/2019 1410   GFRAA 55 (L) 05/29/2014 0949    No results found for: SPEP, UPEP  Lab Results  Component Value Date   WBC 3.4 (L) 03/04/2019   NEUTROABS 1.5 (L) 03/04/2019   HGB 12.8 (L) 03/04/2019   HCT 41.0 03/04/2019   MCV 95.1 03/04/2019   PLT 160 03/04/2019      Chemistry      Component Value Date/Time   NA 139 03/04/2019 1410   NA 139 05/01/2014 1322   K 4.6 03/04/2019 1410   K 4.4 05/01/2014 1322   CL 109 03/04/2019 1410   CL 107 05/01/2014 1322   CO2 24 03/04/2019 1410   CO2 27 05/01/2014 1322   BUN 22 03/04/2019 1410   BUN 16 05/01/2014 1322   CREATININE 1.43 (H) 03/04/2019 1410   CREATININE 1.48 (H) 05/29/2014 0949      Component Value Date/Time   CALCIUM 8.9 03/04/2019 1410   CALCIUM 9.1 05/01/2014 1322   ALKPHOS 49 03/04/2019 1410   ALKPHOS 47 05/01/2014 1322   AST 20 03/04/2019 1410   AST 17 05/01/2014 1322   ALT 18 03/04/2019 1410   ALT 13 (L)  05/01/2014 1322   BILITOT 0.4 03/04/2019 1410   BILITOT 0.6 05/01/2014 1322       RADIOGRAPHIC STUDIES: I have personally reviewed the radiological images as listed and agreed with the findings in the report. No results found.   ASSESSMENT & PLAN:  Multiple myeloma in remission ([place]) #Multiple myeloma-most recently on maintenance Revlimid 15 mg 2  weeks on 2 week off.  SPEP 2020: NEG NOV  however, slowly rising kappa lambda light chain ratio is 1.99- STABLE.   # continue current dose/and schedule of revlimid-ANC is 1.5.   # CKD-III- GFR 40s-50s (baseline creatinine 1.3-1.4; peak 1.8).  Stable.  #Bone modifying agent/multiple myeloma-- on zometa '3mg'$  q  Monthy.  Stable.  Proceed with Zometa today.  # Elevated blood pressure-blood pressures in 160s to 170s.  Recommend keeping a log of his blood pressures.  Recommend follow-up with his PCP.  # I discussed regarding Covid-19 precautions.  I reviewed the vaccine effectiveness and potential side effects in detail.  Also discussed long-term effectiveness and safety profile are unclear at this time.  I discussed December, 2020 ASCO position statement-that all patients are recommended COVID-19 vaccinations [when available]-as long as they do not have allergy to components of the vaccine.  However, I think the benefits of the vaccination outweigh the potential risks. Re: KYHCW-23 vaccination.  For more information/scheduling recommend call [phone], 8:30am-4:30pm.   Disposition: # Proceed with Zometa 3 mg today # in 1 month- labs- cbc/cmp # Follow up in 2 MD;  labs (CBC, CMP, kappa lambda light chains, multiple myeloma panel),  and possible Zometa. Dr.B   Orders Placed This Encounter  Procedures  . Kappa/lambda light chains    Standing Status:   Future    Number of Occurrences:   1    Standing Expiration Date:   03/03/2020  . CBC with Differential    Standing Status:   Standing    Number of  Occurrences:   20    Standing  Expiration Date:   03/03/2020  . Comprehensive metabolic panel    Standing Status:   Standing    Number of Occurrences:   20    Standing Expiration Date:   03/03/2020  . Multiple Myeloma Panel (SPEP&IFE w/QIG)    Standing Status:   Future    Standing Expiration Date:   03/03/2020  . Kappa/lambda light chains    Standing Status:   Future    Standing Expiration Date:   03/03/2020   All questions were answered. The patient knows to call the clinic with any problems, questions or concerns.      Cammie Sickle, MD 03/04/2019 3:07 PM

## 2019-03-05 LAB — KAPPA/LAMBDA LIGHT CHAINS
Kappa free light chain: 55.5 mg/L — ABNORMAL HIGH (ref 3.3–19.4)
Kappa, lambda light chain ratio: 1.55 (ref 0.26–1.65)
Lambda free light chains: 35.8 mg/L — ABNORMAL HIGH (ref 5.7–26.3)

## 2019-03-10 LAB — MULTIPLE MYELOMA PANEL, SERUM
Albumin SerPl Elph-Mcnc: 3.5 g/dL (ref 2.9–4.4)
Albumin/Glob SerPl: 1.1 (ref 0.7–1.7)
Alpha 1: 0.2 g/dL (ref 0.0–0.4)
Alpha2 Glob SerPl Elph-Mcnc: 0.6 g/dL (ref 0.4–1.0)
B-Globulin SerPl Elph-Mcnc: 1.1 g/dL (ref 0.7–1.3)
Gamma Glob SerPl Elph-Mcnc: 1.4 g/dL (ref 0.4–1.8)
Globulin, Total: 3.3 g/dL (ref 2.2–3.9)
IgA: 473 mg/dL — ABNORMAL HIGH (ref 61–437)
IgG (Immunoglobin G), Serum: 1452 mg/dL (ref 603–1613)
IgM (Immunoglobulin M), Srm: 39 mg/dL (ref 15–143)
Total Protein ELP: 6.8 g/dL (ref 6.0–8.5)

## 2019-03-26 IMAGING — US US EXTREM LOW VENOUS*R*
1 series · 14 of 24 positions shown · non-contrast
Comparison: None.

CLINICAL DATA: Redness and swelling for 4 days.  Cellulitis.

EXAM:
RIGHT LOWER EXTREMITY VENOUS DUPLEX ULTRASOUND
TECHNIQUE: Doppler venous assessment of the right lower extremity deep venous
system was performed, including characterization of spectral flow,
compressibility, and phasicity.

[Series 1: us extrem low venous*right* · 0.08mm/px · 14 of 34 slices shown]
[im 1/34]
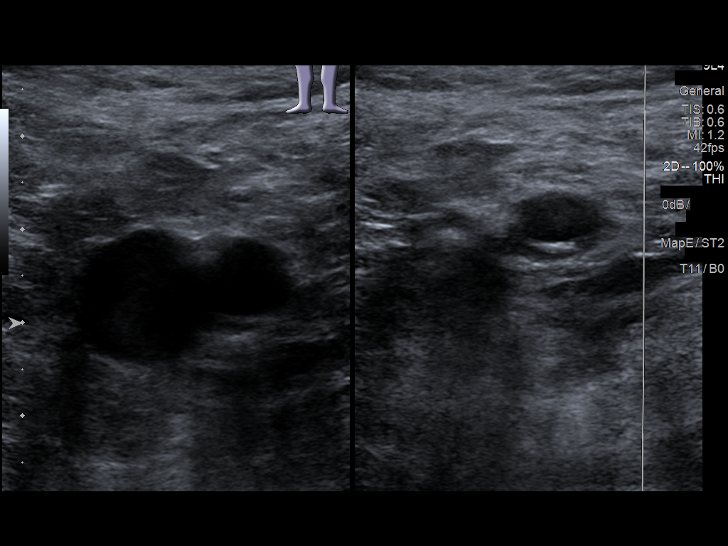
[im 3/34]
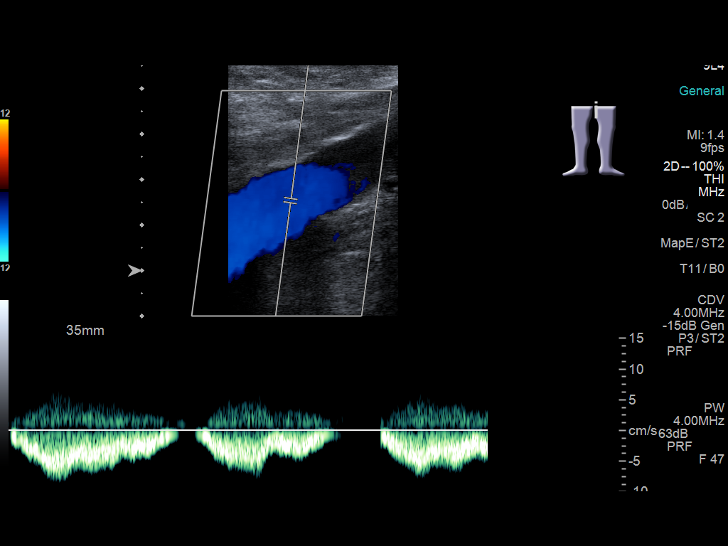
[im 6/34]
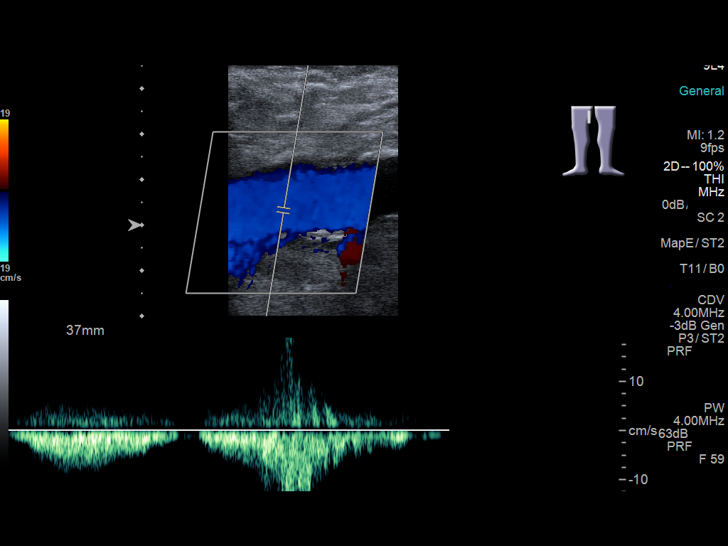
[im 9/34]
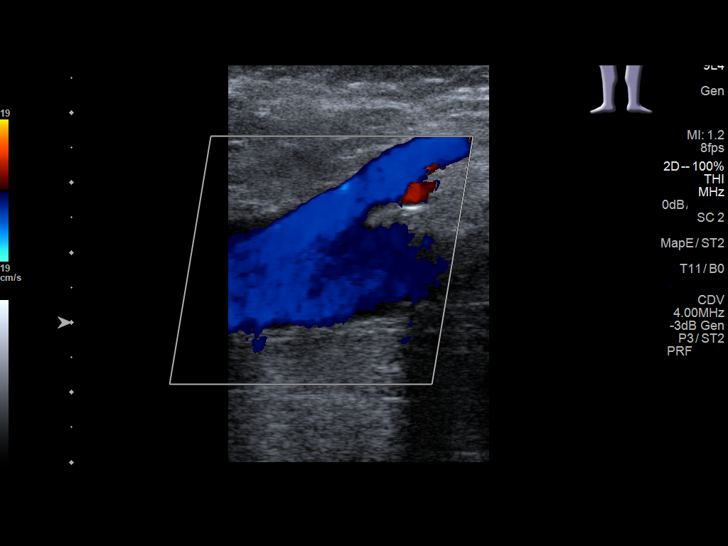
[im 11/34]
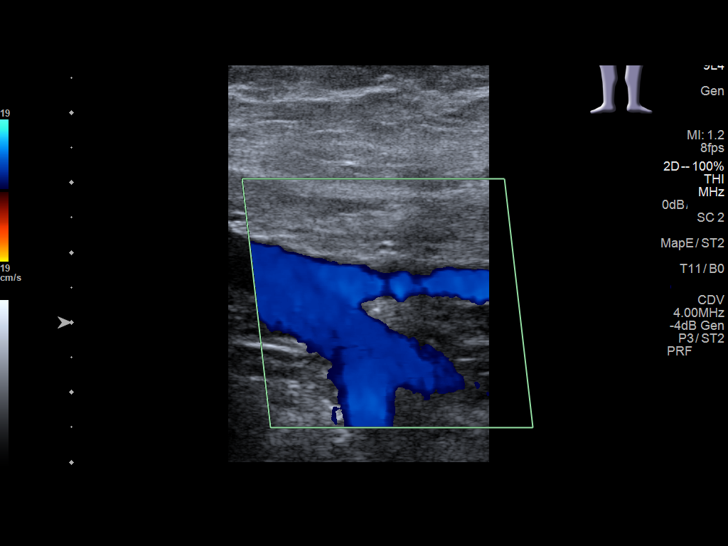
[im 13/34]
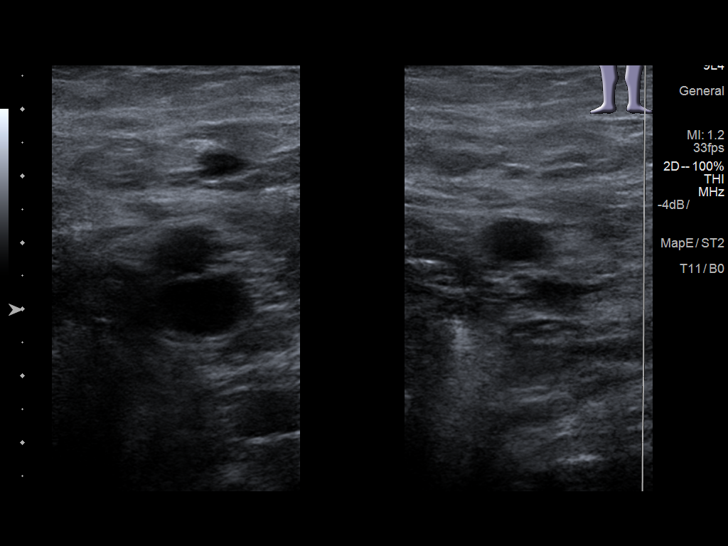
[im 16/34]
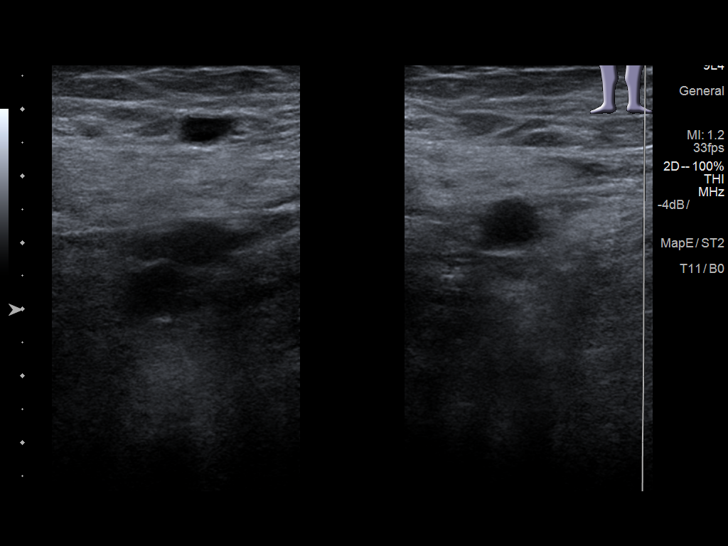
[im 18/34]
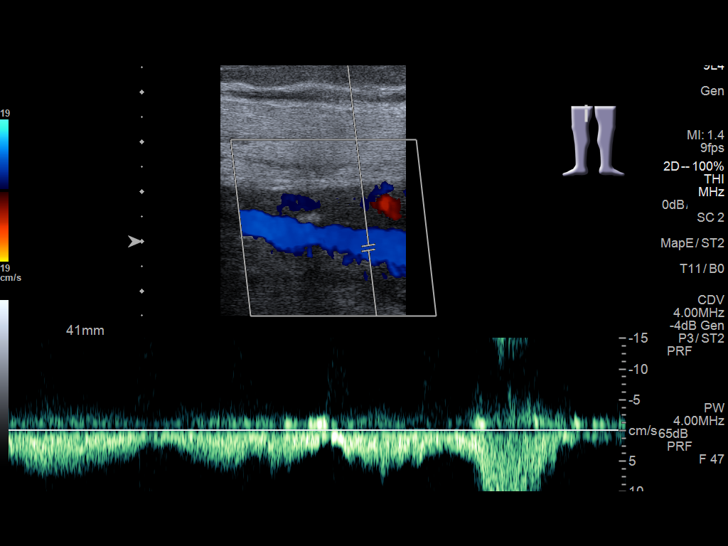
[im 21/34]
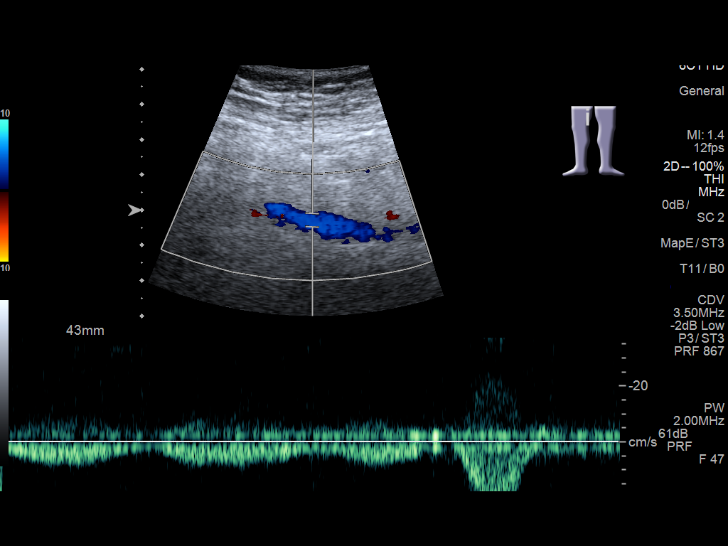
[im 23/34]
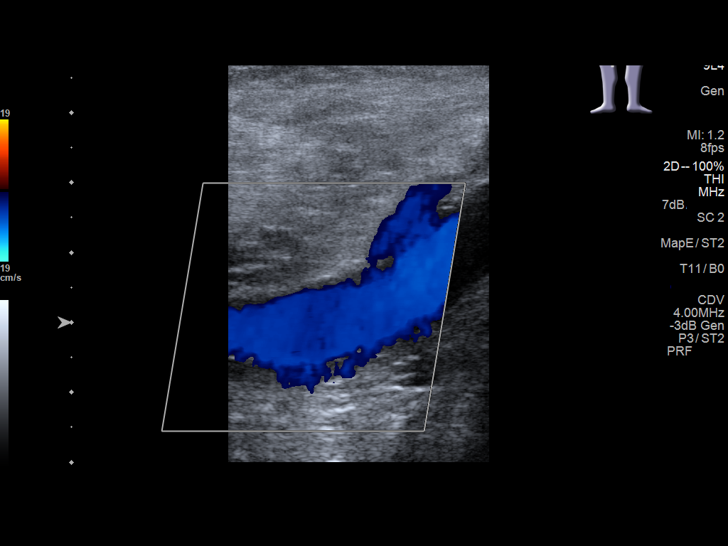
[im 26/34]
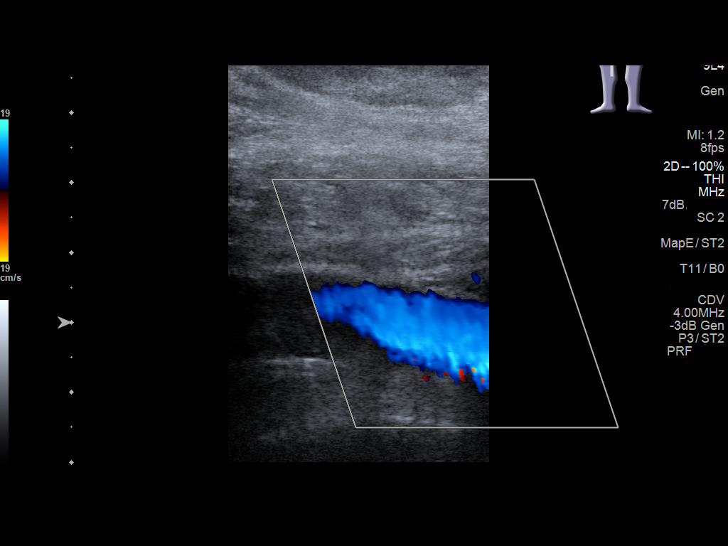
[im 28/34]
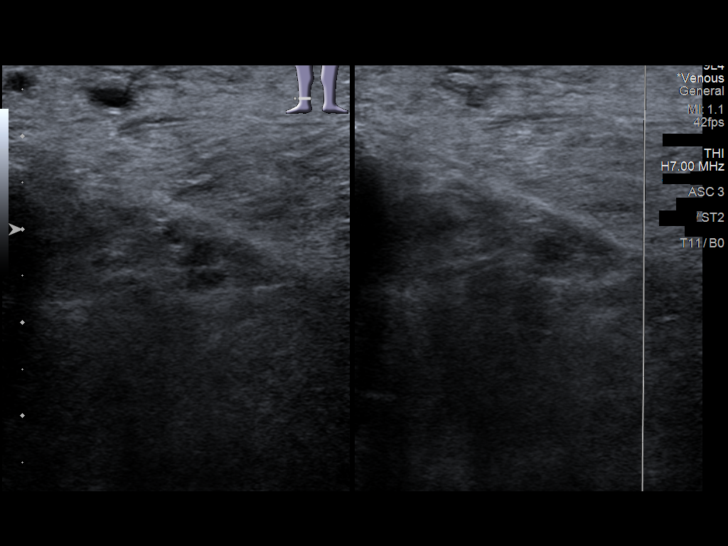
[im 31/34]
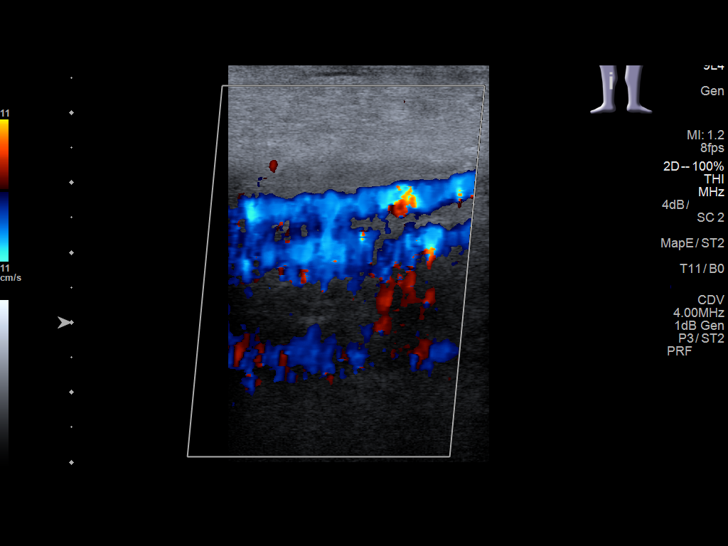
[im 34/34]
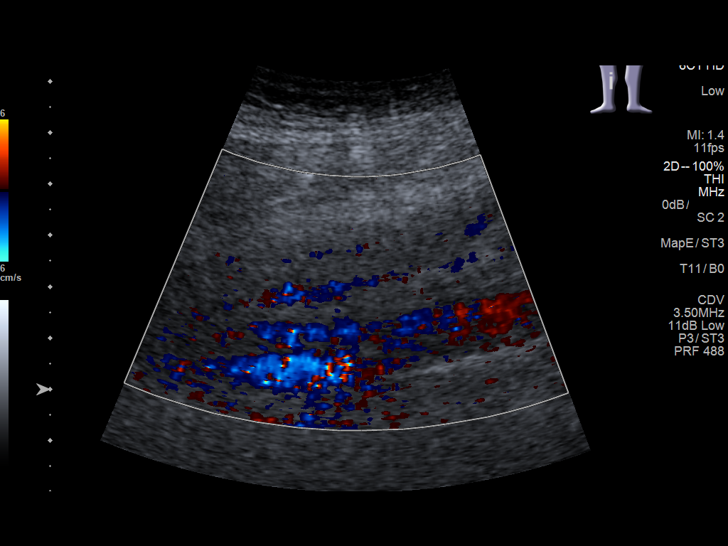

[14 of 24 positions shown; findings below may reference images not displayed]

FINDINGS: There is complete compressibility of the right common femoral,
femoral, and popliteal veins. Doppler analysis demonstrates
respiratory phasicity and augmentation of flow with calf
compression. No obvious superficial vein or calf vein thrombosis.
IMPRESSION: No evidence of right lower extremity DVT

## 2019-03-31 ENCOUNTER — Other Ambulatory Visit: Payer: Self-pay

## 2019-03-31 ENCOUNTER — Other Ambulatory Visit: Payer: Self-pay | Admitting: *Deleted

## 2019-03-31 DIAGNOSIS — C9001 Multiple myeloma in remission: Secondary | ICD-10-CM

## 2019-04-01 ENCOUNTER — Other Ambulatory Visit: Payer: Self-pay

## 2019-04-01 ENCOUNTER — Inpatient Hospital Stay: Payer: Medicare HMO | Attending: Internal Medicine

## 2019-04-01 DIAGNOSIS — C9001 Multiple myeloma in remission: Secondary | ICD-10-CM

## 2019-04-01 LAB — CBC WITH DIFFERENTIAL/PLATELET
Abs Immature Granulocytes: 0.01 10*3/uL (ref 0.00–0.07)
Basophils Absolute: 0 10*3/uL (ref 0.0–0.1)
Basophils Relative: 1 %
Eosinophils Absolute: 0.1 10*3/uL (ref 0.0–0.5)
Eosinophils Relative: 3 %
HCT: 40.7 % (ref 39.0–52.0)
Hemoglobin: 12.8 g/dL — ABNORMAL LOW (ref 13.0–17.0)
Immature Granulocytes: 0 %
Lymphocytes Relative: 36 %
Lymphs Abs: 1.1 10*3/uL (ref 0.7–4.0)
MCH: 30.2 pg (ref 26.0–34.0)
MCHC: 31.4 g/dL (ref 30.0–36.0)
MCV: 96 fL (ref 80.0–100.0)
Monocytes Absolute: 0.5 10*3/uL (ref 0.1–1.0)
Monocytes Relative: 17 %
Neutro Abs: 1.3 10*3/uL — ABNORMAL LOW (ref 1.7–7.7)
Neutrophils Relative %: 43 %
Platelets: 145 10*3/uL — ABNORMAL LOW (ref 150–400)
RBC: 4.24 MIL/uL (ref 4.22–5.81)
RDW: 15 % (ref 11.5–15.5)
WBC: 3 10*3/uL — ABNORMAL LOW (ref 4.0–10.5)
nRBC: 0 % (ref 0.0–0.2)

## 2019-04-01 LAB — COMPREHENSIVE METABOLIC PANEL
ALT: 18 U/L (ref 0–44)
AST: 19 U/L (ref 15–41)
Albumin: 3.9 g/dL (ref 3.5–5.0)
Alkaline Phosphatase: 49 U/L (ref 38–126)
Anion gap: 5 (ref 5–15)
BUN: 20 mg/dL (ref 8–23)
CO2: 26 mmol/L (ref 22–32)
Calcium: 9 mg/dL (ref 8.9–10.3)
Chloride: 108 mmol/L (ref 98–111)
Creatinine, Ser: 1.33 mg/dL — ABNORMAL HIGH (ref 0.61–1.24)
GFR calc Af Amer: >60 mL/min (ref >60)
GFR calc non Af Amer: 52 mL/min — ABNORMAL LOW (ref >60)
Glucose, Bld: 139 mg/dL — ABNORMAL HIGH (ref 70–99)
Potassium: 5 mmol/L (ref 3.5–5.1)
Sodium: 139 mmol/L (ref 135–145)
Total Bilirubin: 0.5 mg/dL (ref 0.3–1.2)
Total Protein: 7.6 g/dL (ref 6.5–8.1)

## 2019-04-02 MED ORDER — LENALIDOMIDE 15 MG PO CAPS: ORAL_CAPSULE | ORAL | 0 refills | Status: DC

## 2019-04-28 ENCOUNTER — Other Ambulatory Visit: Payer: Self-pay

## 2019-04-28 ENCOUNTER — Other Ambulatory Visit: Payer: Self-pay | Admitting: *Deleted

## 2019-04-28 DIAGNOSIS — C9001 Multiple myeloma in remission: Secondary | ICD-10-CM

## 2019-04-28 MED ORDER — LENALIDOMIDE 15 MG PO CAPS: ORAL_CAPSULE | ORAL | 0 refills | Status: DC

## 2019-04-28 NOTE — Progress Notes (Signed)
Spoke with patient. He is due for revlimid renewal. Rems survey performed. Patient instructed to take his patient survey in order for medication to be dispense. He gave verbal understanding of the plan of care.

## 2019-04-29 ENCOUNTER — Inpatient Hospital Stay: Payer: Medicare HMO

## 2019-04-29 ENCOUNTER — Inpatient Hospital Stay (HOSPITAL_BASED_OUTPATIENT_CLINIC_OR_DEPARTMENT_OTHER): Payer: Medicare HMO | Admitting: Internal Medicine

## 2019-04-29 ENCOUNTER — Inpatient Hospital Stay: Payer: Medicare HMO | Attending: Internal Medicine

## 2019-04-29 ENCOUNTER — Encounter: Payer: Self-pay | Admitting: Internal Medicine

## 2019-04-29 VITALS — Resp 18

## 2019-04-29 DIAGNOSIS — Z7982 Long term (current) use of aspirin: Secondary | ICD-10-CM | POA: Diagnosis not present

## 2019-04-29 DIAGNOSIS — Z7984 Long term (current) use of oral hypoglycemic drugs: Secondary | ICD-10-CM | POA: Insufficient documentation

## 2019-04-29 DIAGNOSIS — C9001 Multiple myeloma in remission: Secondary | ICD-10-CM

## 2019-04-29 DIAGNOSIS — I129 Hypertensive chronic kidney disease with stage 1 through stage 4 chronic kidney disease, or unspecified chronic kidney disease: Secondary | ICD-10-CM | POA: Diagnosis not present

## 2019-04-29 DIAGNOSIS — Z79899 Other long term (current) drug therapy: Secondary | ICD-10-CM | POA: Insufficient documentation

## 2019-04-29 DIAGNOSIS — E1122 Type 2 diabetes mellitus with diabetic chronic kidney disease: Secondary | ICD-10-CM | POA: Diagnosis not present

## 2019-04-29 LAB — COMPREHENSIVE METABOLIC PANEL
ALT: 16 U/L (ref 0–44)
AST: 16 U/L (ref 15–41)
Albumin: 3.7 g/dL (ref 3.5–5.0)
Alkaline Phosphatase: 45 U/L (ref 38–126)
Anion gap: 6 (ref 5–15)
BUN: 19 mg/dL (ref 8–23)
CO2: 25 mmol/L (ref 22–32)
Calcium: 8.8 mg/dL — ABNORMAL LOW (ref 8.9–10.3)
Chloride: 106 mmol/L (ref 98–111)
Creatinine, Ser: 1.4 mg/dL — ABNORMAL HIGH (ref 0.61–1.24)
GFR calc Af Amer: 57 mL/min — ABNORMAL LOW (ref >60)
GFR calc non Af Amer: 49 mL/min — ABNORMAL LOW (ref >60)
Glucose, Bld: 121 mg/dL — ABNORMAL HIGH (ref 70–99)
Potassium: 4.6 mmol/L (ref 3.5–5.1)
Sodium: 137 mmol/L (ref 135–145)
Total Bilirubin: 0.5 mg/dL (ref 0.3–1.2)
Total Protein: 7.3 g/dL (ref 6.5–8.1)

## 2019-04-29 LAB — CBC WITH DIFFERENTIAL/PLATELET
Abs Immature Granulocytes: 0.01 10*3/uL (ref 0.00–0.07)
Basophils Absolute: 0 10*3/uL (ref 0.0–0.1)
Basophils Relative: 1 %
Eosinophils Absolute: 0.1 10*3/uL (ref 0.0–0.5)
Eosinophils Relative: 3 %
HCT: 38.6 % — ABNORMAL LOW (ref 39.0–52.0)
Hemoglobin: 12.7 g/dL — ABNORMAL LOW (ref 13.0–17.0)
Immature Granulocytes: 0 %
Lymphocytes Relative: 35 %
Lymphs Abs: 1.1 10*3/uL (ref 0.7–4.0)
MCH: 30.8 pg (ref 26.0–34.0)
MCHC: 32.9 g/dL (ref 30.0–36.0)
MCV: 93.5 fL (ref 80.0–100.0)
Monocytes Absolute: 0.6 10*3/uL (ref 0.1–1.0)
Monocytes Relative: 18 %
Neutro Abs: 1.4 10*3/uL — ABNORMAL LOW (ref 1.7–7.7)
Neutrophils Relative %: 43 %
Platelets: 156 10*3/uL (ref 150–400)
RBC: 4.13 MIL/uL — ABNORMAL LOW (ref 4.22–5.81)
RDW: 14.8 % (ref 11.5–15.5)
WBC: 3.1 10*3/uL — ABNORMAL LOW (ref 4.0–10.5)
nRBC: 0 % (ref 0.0–0.2)

## 2019-04-29 MED ORDER — SODIUM CHLORIDE 0.9 % IV SOLN
Freq: Once | INTRAVENOUS | Status: AC
  Filled 2019-04-29: qty 250

## 2019-04-29 MED ORDER — ZOLEDRONIC ACID 4 MG/5ML IV CONC
3.0000 mg | Freq: Once | INTRAVENOUS | Status: AC
  Administered 2019-04-29: 3 mg via INTRAVENOUS
  Filled 2019-04-29: qty 3.75

## 2019-04-29 NOTE — Progress Notes (Signed)
[facility] OFFICE PROGRESS NOTE  Patient Care Team: Baxter Hire, MD as PCP - General (Internal Medicine) Druscilla Brownie, MD as Consulting Physician (Dermatology) Cammie Sickle, MD as Consulting Physician (Hematology and Oncology)  Cancer Staging No matching staging information was found for the patient.   Oncology History Overview Note   # April 2014- MULTIPLE MYELOMA [IgG- 2.2gm/dl; 40-50% plasma cell; Cyto-N; FISH- gain of chr.9, 15 & ccnd1/11q13] s/p RVD x6 [sep 2014; BMBx- ~5% plasma cells]; Maint Rev-DEX-Zometa; March 2015-Stopped Dex Cont- Rev-Zometa; July 2016- Rev 72m; NOV 4th  2016-HOLD; [facility][Dr.Woods]; declined BMT.   # Re-START FEB 2017 Rev 174m3 W- on & 1 w OFF; HELD Rev June- Oct 2020- sec to worsening renal function [Dr.Singh]; SEP 2020- Rev 15 mg 2 w-On & 2 w-OFF.   # Zometa  # CKD [creat ~1.4; sec MM; Dr.Singh]; DM  -------------------------------------------------------------------------   DIAGNOSIS: [2[name] MULTIPLE MYELOMA  GOALS: control  CURRENT/MOST RECENT THERAPY- REVLIMID Maintenance [Feb 2017]    Multiple myeloma in remission ([facility] 10/01/2015 Initial Diagnosis   Multiple myeloma in remission ([facility]    INTERVAL HISTORY:  ThShann Merrick89.o.  male pleasant patient above history of multiple myeloma most recently on Revlimid maintenance is here for follow-up.  Patient denies any worsening swelling the legs denies any rash.  No fatigue.  Appetite is good.  No fevers or chills.  Denies any jaw pain.   Review of Systems  Constitutional: Negative for chills, diaphoresis, fever, malaise/fatigue and weight loss.  HENT: Negative for nosebleeds and sore throat.   Eyes: Negative for double vision.  Respiratory: Negative for cough, hemoptysis, sputum production, shortness of breath and wheezing.   Cardiovascular: Negative for chest pain, palpitations, orthopnea and leg swelling.  Gastrointestinal: Negative for  abdominal pain, blood in stool, constipation, diarrhea, heartburn, melena, nausea and vomiting.  Genitourinary: Negative for dysuria, frequency and urgency.  Musculoskeletal: Negative for back pain and joint pain.  Skin: Negative.  Negative for itching and rash.  Neurological: Negative for dizziness, tingling, focal weakness, weakness and headaches.  Endo/Heme/Allergies: Does not bruise/bleed easily.  Psychiatric/Behavioral: Negative for depression. The patient is not nervous/anxious and does not have insomnia.       PAST MEDICAL HISTORY :  Past Medical History:  Diagnosis Date  . Diabetes mellitus without complication ([facility]  . GERD (gastroesophageal reflux disease)   . Hyperlipidemia   . Hypertension   . Multiple myeloma ([facility]  . Obesity   . Thyroid cancer ([facility]  . Thyroid disease     PAST SURGICAL HISTORY :   Past Surgical History:  Procedure Laterality Date  . BONE MARROW BIOPSY  2014  . CATARACT EXTRACTION BILATERAL W/ ANTERIOR VITRECTOMY      FAMILY HISTORY :   Family History  Problem Relation Age of Onset  . Cancer Father [id number]. Diabetes Mother     SOCIAL HISTORY:   Social History   Tobacco Use  . Smoking status: Never Smoker  . Smokeless tobacco: Never Used  Substance Use Topics  . Alcohol use: No  . Drug use: No    ALLERGIES:  has No Known Allergies.  MEDICATIONS:  Current Outpatient Medications  Medication Sig Dispense Refill  . amLODipine (NORVASC) 10 MG tablet Take 10 mg by mouth daily.    . Marland Kitchenspirin EC 81 MG tablet Take 81 mg by mouth daily.    . Marland Kitchentorvastatin (LIPITOR) 20 MG tablet Take  20 mg by mouth daily.    . cloNIDine (CATAPRES) 0.1 MG tablet Take 0.1 mg by mouth 2 (two) times daily.    . Cyanocobalamin (RA VITAMIN B-12 TR) 1000 MCG TBCR Take by mouth.    . levothyroxine (SYNTHROID) 137 MCG tablet Take 1 tablet by mouth daily.     [name] DELICA LANCETS 68G MISC Inject 1 Lancet as directed once daily.    . pantoprazole (PROTONIX) 40 MG  tablet TAKE 1 TABLET BY MOUTH EVERY DAY 60 tablet 3  . pioglitazone (ACTOS) 30 MG tablet Take 30 mg by mouth daily.    . potassium chloride SA (K-DUR,KLOR-CON) 20 MEQ tablet Take 20 mEq by mouth 2 (two) times daily.   0  . quinapril (ACCUPRIL) 40 MG tablet Take 40 mg by mouth daily.    [name] lenalidomide (REVLIMID) 15 MG capsule Take 1 capsule (15 mg total) by mouth daily for 2 weeks, then hold for 2 weeks off (Patient not taking: Reported on 04/28/2019) 14 capsule 0   No current facility-administered medications for this visit.   Facility-Administered Medications Ordered in Other Visits  Medication Dose Route Frequency Provider Last Rate Last Admin  . 0.9 %  sodium chloride infusion   Intravenous Continuous Cammie Sickle, MD   Stopped at 12/11/14 1528    PHYSICAL EXAMINATION: ECOG PERFORMANCE STATUS: 0 - Asymptomatic  BP (!) 144/69 (BP Location: Left Arm, Patient Position: Sitting, Cuff Size: Large)   Pulse (!) 54   Temp (!) 97.5 F (36.4 C) (Tympanic)   Wt 246 lb (111.6 kg)   BMI 35.30 kg/m   Filed Weights   04/29/19 1436  Weight: 246 lb (111.6 kg)    Physical Exam  Constitutional: He is oriented to person, place, and time and well-developed, well-nourished, and in no distress.  HENT:  Head: Normocephalic and atraumatic.  Mouth/Throat: Oropharynx is clear and moist. No oropharyngeal exudate.  Eyes: Pupils are equal, round, and reactive to light.  Cardiovascular: Normal rate and regular rhythm.  Pulmonary/Chest: No respiratory distress. He has no wheezes.  Abdominal: Soft. Bowel sounds are normal. He exhibits no distension and no mass. There is no abdominal tenderness. There is no rebound and no guarding.  Musculoskeletal:        General: No tenderness or edema. Normal range of motion.     Cervical back: Normal range of motion and neck supple.  Neurological: He is alert and oriented to person, place, and time.  Skin: Skin is warm.  Psychiatric: Affect normal.       LABORATORY DATA:  I have reviewed the data as listed    Component Value Date/Time   NA 137 04/29/2019 1418   NA 139 05/01/2014 1322   K 4.6 04/29/2019 1418   K 4.4 05/01/2014 1322   CL 106 04/29/2019 1418   CL 107 05/01/2014 1322   CO2 25 04/29/2019 1418   CO2 27 05/01/2014 1322   GLUCOSE 121 (H) 04/29/2019 1418   GLUCOSE 102 (H) 05/01/2014 1322   BUN 19 04/29/2019 1418   BUN 16 05/01/2014 1322   CREATININE 1.40 (H) 04/29/2019 1418   CREATININE 1.48 (H) 05/29/2014 0949   CALCIUM 8.8 (L) 04/29/2019 1418   CALCIUM 9.1 05/01/2014 1322   PROT 7.3 04/29/2019 1418   PROT 7.5 05/01/2014 1322   ALBUMIN 3.7 04/29/2019 1418   ALBUMIN 3.6 05/01/2014 1322   AST 16 04/29/2019 1418   AST 17 05/01/2014 1322   ALT 16 04/29/2019 1418   ALT  13 (L) 05/01/2014 1322   ALKPHOS 45 04/29/2019 1418   ALKPHOS 47 05/01/2014 1322   BILITOT 0.5 04/29/2019 1418   BILITOT 0.6 05/01/2014 1322   GFRNONAA 49 (L) 04/29/2019 1418   GFRNONAA 47 (L) 05/29/2014 0949   GFRAA 57 (L) 04/29/2019 1418   GFRAA 55 (L) 05/29/2014 0949    No results found for: SPEP, UPEP  Lab Results  Component Value Date   WBC 3.1 (L) 04/29/2019   NEUTROABS 1.4 (L) 04/29/2019   HGB 12.7 (L) 04/29/2019   HCT 38.6 (L) 04/29/2019   MCV 93.5 04/29/2019   PLT 156 04/29/2019      Chemistry      Component Value Date/Time   NA 137 04/29/2019 1418   NA 139 05/01/2014 1322   K 4.6 04/29/2019 1418   K 4.4 05/01/2014 1322   CL 106 04/29/2019 1418   CL 107 05/01/2014 1322   CO2 25 04/29/2019 1418   CO2 27 05/01/2014 1322   BUN 19 04/29/2019 1418   BUN 16 05/01/2014 1322   CREATININE 1.40 (H) 04/29/2019 1418   CREATININE 1.48 (H) 05/29/2014 0949      Component Value Date/Time   CALCIUM 8.8 (L) 04/29/2019 1418   CALCIUM 9.1 05/01/2014 1322   ALKPHOS 45 04/29/2019 1418   ALKPHOS 47 05/01/2014 1322   AST 16 04/29/2019 1418   AST 17 05/01/2014 1322   ALT 16 04/29/2019 1418   ALT 13 (L) 05/01/2014 1322   BILITOT  0.5 04/29/2019 1418   BILITOT 0.6 05/01/2014 1322       RADIOGRAPHIC STUDIES: I have personally reviewed the radiological images as listed and agreed with the findings in the report. No results found.   ASSESSMENT & PLAN:  Multiple myeloma in remission ([place]) #Multiple myeloma-most recently on maintenance Revlimid 15 mg 2  weeks on 2 week off.  JAN 2021M-protine-NEG; kappa lambda light chain ratio=N; - STABLE>   # continue current dose/and schedule of revlimid-ANC is 1.5.   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 STABLE.   #Bone modifying agent/multiple myeloma-- on zometa 42m q  Monthy.  Stable.  Proceed with Zometa today.  # Elevated blood pressure-blood pressures in the 1865systolic stable recommend keeping a log of his blood pressures.  Recommend follow-up with his PCP.  Disposition: # Proceed with Zometa 3 mg today # in 1 month- labs- cbc/cmp # Follow up in 2 MD;  labs (CBC, CMP, kappa lambda light chains, multiple myeloma panel),  and possible Zometa. Dr.B   No orders of the defined types were placed in this encounter.  All questions were answered. The patient knows to call the clinic with any problems, questions or concerns.      GCammie Sickle MD 04/29/2019 3:03 PM

## 2019-04-29 NOTE — Assessment & Plan Note (Addendum)
#  Multiple myeloma-most recently on maintenance Revlimid 15 mg 2  weeks on 2 week off.  JAN 2021M-protine-NEG; kappa lambda light chain ratio=N; - STABLE>   # continue current dose/and schedule of revlimid-ANC is 1.5.   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 STABLE.   #Bone modifying agent/multiple myeloma-- on zometa '3mg'$  q  Monthy.  Stable.  Proceed with Zometa today.  # Elevated blood pressure-blood pressures in the 301 systolic stable recommend keeping a log of his blood pressures.  Recommend follow-up with his PCP.  Disposition: # Proceed with Zometa 3 mg today # in 1 month- labs- cbc/cmp # Follow up in 2 MD;  labs (CBC, CMP, kappa lambda light chains, multiple myeloma panel),  and possible Zometa. Dr.B

## 2019-04-30 LAB — KAPPA/LAMBDA LIGHT CHAINS
Kappa free light chain: 60.6 mg/L — ABNORMAL HIGH (ref 3.3–19.4)
Kappa, lambda light chain ratio: 1.74 — ABNORMAL HIGH (ref 0.26–1.65)
Lambda free light chains: 34.8 mg/L — ABNORMAL HIGH (ref 5.7–26.3)

## 2019-05-01 LAB — MULTIPLE MYELOMA PANEL, SERUM
Albumin SerPl Elph-Mcnc: 3.3 g/dL (ref 2.9–4.4)
Albumin/Glob SerPl: 1.1 (ref 0.7–1.7)
Alpha 1: 0.2 g/dL (ref 0.0–0.4)
Alpha2 Glob SerPl Elph-Mcnc: 0.6 g/dL (ref 0.4–1.0)
B-Globulin SerPl Elph-Mcnc: 1.1 g/dL (ref 0.7–1.3)
Gamma Glob SerPl Elph-Mcnc: 1.4 g/dL (ref 0.4–1.8)
Globulin, Total: 3.2 g/dL (ref 2.2–3.9)
IgA: 457 mg/dL — ABNORMAL HIGH (ref 61–437)
IgG (Immunoglobin G), Serum: 1429 mg/dL (ref 603–1613)
IgM (Immunoglobulin M), Srm: 33 mg/dL (ref 15–143)
Total Protein ELP: 6.5 g/dL (ref 6.0–8.5)

## 2019-05-23 ENCOUNTER — Other Ambulatory Visit: Payer: Self-pay | Admitting: *Deleted

## 2019-05-23 DIAGNOSIS — C9001 Multiple myeloma in remission: Secondary | ICD-10-CM

## 2019-05-26 MED ORDER — LENALIDOMIDE 15 MG PO CAPS: ORAL_CAPSULE | ORAL | 0 refills | Status: DC

## 2019-05-27 ENCOUNTER — Inpatient Hospital Stay: Payer: Medicare HMO | Attending: Internal Medicine

## 2019-05-27 ENCOUNTER — Other Ambulatory Visit: Payer: Self-pay

## 2019-05-27 DIAGNOSIS — C9001 Multiple myeloma in remission: Secondary | ICD-10-CM

## 2019-05-27 LAB — CBC WITH DIFFERENTIAL/PLATELET
Abs Immature Granulocytes: 0.02 10*3/uL (ref 0.00–0.07)
Basophils Absolute: 0 10*3/uL (ref 0.0–0.1)
Basophils Relative: 1 %
Eosinophils Absolute: 0.1 10*3/uL (ref 0.0–0.5)
Eosinophils Relative: 2 %
HCT: 37.1 % — ABNORMAL LOW (ref 39.0–52.0)
Hemoglobin: 12.1 g/dL — ABNORMAL LOW (ref 13.0–17.0)
Immature Granulocytes: 1 %
Lymphocytes Relative: 31 %
Lymphs Abs: 1.1 10*3/uL (ref 0.7–4.0)
MCH: 30.7 pg (ref 26.0–34.0)
MCHC: 32.6 g/dL (ref 30.0–36.0)
MCV: 94.2 fL (ref 80.0–100.0)
Monocytes Absolute: 0.6 10*3/uL (ref 0.1–1.0)
Monocytes Relative: 17 %
Neutro Abs: 1.7 10*3/uL (ref 1.7–7.7)
Neutrophils Relative %: 48 %
Platelets: 237 10*3/uL (ref 150–400)
RBC: 3.94 MIL/uL — ABNORMAL LOW (ref 4.22–5.81)
RDW: 15 % (ref 11.5–15.5)
WBC: 3.6 10*3/uL — ABNORMAL LOW (ref 4.0–10.5)
nRBC: 0 % (ref 0.0–0.2)

## 2019-05-27 LAB — COMPREHENSIVE METABOLIC PANEL
ALT: 14 U/L (ref 0–44)
AST: 15 U/L (ref 15–41)
Albumin: 3.4 g/dL — ABNORMAL LOW (ref 3.5–5.0)
Alkaline Phosphatase: 43 U/L (ref 38–126)
Anion gap: 5 (ref 5–15)
BUN: 22 mg/dL (ref 8–23)
CO2: 25 mmol/L (ref 22–32)
Calcium: 8.7 mg/dL — ABNORMAL LOW (ref 8.9–10.3)
Chloride: 109 mmol/L (ref 98–111)
Creatinine, Ser: 1.58 mg/dL — ABNORMAL HIGH (ref 0.61–1.24)
GFR calc Af Amer: 49 mL/min — ABNORMAL LOW (ref >60)
GFR calc non Af Amer: 42 mL/min — ABNORMAL LOW (ref >60)
Glucose, Bld: 117 mg/dL — ABNORMAL HIGH (ref 70–99)
Potassium: 4.9 mmol/L (ref 3.5–5.1)
Sodium: 139 mmol/L (ref 135–145)
Total Bilirubin: 0.5 mg/dL (ref 0.3–1.2)
Total Protein: 7 g/dL (ref 6.5–8.1)

## 2019-06-20 ENCOUNTER — Telehealth: Payer: Self-pay | Admitting: *Deleted

## 2019-06-20 DIAGNOSIS — C9001 Multiple myeloma in remission: Secondary | ICD-10-CM

## 2019-06-20 MED ORDER — LENALIDOMIDE 15 MG PO CAPS: ORAL_CAPSULE | ORAL | 0 refills | Status: DC

## 2019-06-20 NOTE — Telephone Encounter (Signed)
RF request fax from [employer] for patient's revlimid.

## 2019-06-23 ENCOUNTER — Other Ambulatory Visit: Payer: Self-pay

## 2019-06-23 ENCOUNTER — Other Ambulatory Visit: Payer: Self-pay | Admitting: *Deleted

## 2019-06-23 ENCOUNTER — Encounter: Payer: Self-pay | Admitting: Internal Medicine

## 2019-06-23 ENCOUNTER — Inpatient Hospital Stay: Payer: Medicare HMO

## 2019-06-23 ENCOUNTER — Inpatient Hospital Stay: Payer: Medicare HMO | Attending: Internal Medicine

## 2019-06-23 ENCOUNTER — Inpatient Hospital Stay (HOSPITAL_BASED_OUTPATIENT_CLINIC_OR_DEPARTMENT_OTHER): Payer: Medicare HMO | Admitting: Internal Medicine

## 2019-06-23 DIAGNOSIS — C9001 Multiple myeloma in remission: Secondary | ICD-10-CM

## 2019-06-23 DIAGNOSIS — K219 Gastro-esophageal reflux disease without esophagitis: Secondary | ICD-10-CM | POA: Insufficient documentation

## 2019-06-23 DIAGNOSIS — Z7982 Long term (current) use of aspirin: Secondary | ICD-10-CM | POA: Diagnosis not present

## 2019-06-23 DIAGNOSIS — Z79899 Other long term (current) drug therapy: Secondary | ICD-10-CM | POA: Diagnosis not present

## 2019-06-23 DIAGNOSIS — J069 Acute upper respiratory infection, unspecified: Secondary | ICD-10-CM | POA: Diagnosis not present

## 2019-06-23 DIAGNOSIS — E1122 Type 2 diabetes mellitus with diabetic chronic kidney disease: Secondary | ICD-10-CM | POA: Diagnosis not present

## 2019-06-23 DIAGNOSIS — N183 Chronic kidney disease, stage 3 unspecified: Secondary | ICD-10-CM | POA: Insufficient documentation

## 2019-06-23 DIAGNOSIS — I129 Hypertensive chronic kidney disease with stage 1 through stage 4 chronic kidney disease, or unspecified chronic kidney disease: Secondary | ICD-10-CM | POA: Insufficient documentation

## 2019-06-23 DIAGNOSIS — E785 Hyperlipidemia, unspecified: Secondary | ICD-10-CM | POA: Diagnosis not present

## 2019-06-23 LAB — COMPREHENSIVE METABOLIC PANEL
ALT: 13 U/L (ref 0–44)
AST: 15 U/L (ref 15–41)
Albumin: 3.1 g/dL — ABNORMAL LOW (ref 3.5–5.0)
Alkaline Phosphatase: 50 U/L (ref 38–126)
Anion gap: 5 (ref 5–15)
BUN: 25 mg/dL — ABNORMAL HIGH (ref 8–23)
CO2: 25 mmol/L (ref 22–32)
Calcium: 8.7 mg/dL — ABNORMAL LOW (ref 8.9–10.3)
Chloride: 108 mmol/L (ref 98–111)
Creatinine, Ser: 1.62 mg/dL — ABNORMAL HIGH (ref 0.61–1.24)
GFR calc Af Amer: 47 mL/min — ABNORMAL LOW (ref >60)
GFR calc non Af Amer: 41 mL/min — ABNORMAL LOW (ref >60)
Glucose, Bld: 129 mg/dL — ABNORMAL HIGH (ref 70–99)
Potassium: 4.9 mmol/L (ref 3.5–5.1)
Sodium: 138 mmol/L (ref 135–145)
Total Bilirubin: 0.5 mg/dL (ref 0.3–1.2)
Total Protein: 7.6 g/dL (ref 6.5–8.1)

## 2019-06-23 LAB — CBC WITH DIFFERENTIAL/PLATELET
Abs Immature Granulocytes: 0.02 10*3/uL (ref 0.00–0.07)
Basophils Absolute: 0 10*3/uL (ref 0.0–0.1)
Basophils Relative: 1 %
Eosinophils Absolute: 0.1 10*3/uL (ref 0.0–0.5)
Eosinophils Relative: 4 %
HCT: 36.4 % — ABNORMAL LOW (ref 39.0–52.0)
Hemoglobin: 12 g/dL — ABNORMAL LOW (ref 13.0–17.0)
Immature Granulocytes: 1 %
Lymphocytes Relative: 23 %
Lymphs Abs: 0.9 10*3/uL (ref 0.7–4.0)
MCH: 31.1 pg (ref 26.0–34.0)
MCHC: 33 g/dL (ref 30.0–36.0)
MCV: 94.3 fL (ref 80.0–100.0)
Monocytes Absolute: 0.6 10*3/uL (ref 0.1–1.0)
Monocytes Relative: 16 %
Neutro Abs: 2.3 10*3/uL (ref 1.7–7.7)
Neutrophils Relative %: 55 %
Platelets: 259 10*3/uL (ref 150–400)
RBC: 3.86 MIL/uL — ABNORMAL LOW (ref 4.22–5.81)
RDW: 15 % (ref 11.5–15.5)
WBC: 4 10*3/uL (ref 4.0–10.5)
nRBC: 0 % (ref 0.0–0.2)

## 2019-06-23 NOTE — Progress Notes (Signed)
[facility] OFFICE PROGRESS NOTE  Patient Care Team: Baxter Hire, MD as PCP - General (Internal Medicine) Druscilla Brownie, MD as Consulting Physician (Dermatology) Cammie Sickle, MD as Consulting Physician (Hematology and Oncology)  Cancer Staging No matching staging information was found for the patient.   Oncology History Overview Note   # April 2014- MULTIPLE MYELOMA [IgG- 2.2gm/dl; 40-50% plasma cell; Cyto-N; FISH- gain of chr.9, 15 & ccnd1/11q13] s/p RVD x6 [sep 2014; BMBx- ~5% plasma cells]; Maint Rev-DEX-Zometa; March 2015-Stopped Dex Cont- Rev-Zometa; July 2016- Rev 36m; NOV 4th  2016-HOLD[facility] declined BMT.   # Re-START FEB 2017 Rev 114m3 W- on & 1 w OFF; HELD Rev June- Oct 2020- sec to worsening renal function [Dr.Singh]; SEP 2020- Rev 15 mg 2 w-On & 2 w-OFF.   # Zometa  # CKD [creat ~1.4; sec MM; Dr.Singh]; DM  -------------------------------------------------------------------------   DIAGNOSIS: [2[name] MULTIPLE MYELOMA  GOALS: control  CURRENT/MOST RECENT THERAPY- REVLIMID Maintenance [Feb 2017]    Multiple [facility] in remission ([facility] [place] 10/01/2015 Initial Diagnosis   Multiple myel[place] in remission ([facility]    INTERVAL HISTORY:  Alan Cowden89.o.  male pleasant patient above history of multiple myeloma most recently on Revlimid maintenance is here for follow-up.  Patient denies any joint pains or bone pain.  Denies any nausea vomiting.  Appetite is good.  No dropping.  Complains of nasal stuffiness.  No fevers or chills.  No cough.    Review of Systems  Constitutional: Negative for chills, diaphoresis, fever, malaise/fatigue and weight loss.  HENT: Negative for nosebleeds and sore throat.   Eyes: Negative for double vision.  Respiratory: Negative for cough, hemoptysis, sputum production, shortness of breath and wheezing.   Cardiovascular: Negative for chest pain, palpitations, orthopnea and leg swelling.   Gastrointestinal: Negative for abdominal pain, blood in stool, constipation, diarrhea, heartburn, melena, nausea and vomiting.  Genitourinary: Negative for dysuria, frequency and urgency.  Musculoskeletal: Negative for back pain and joint pain.  Skin: Negative.  Negative for itching and rash.  Neurological: Negative for dizziness, tingling, focal weakness, weakness and headaches.  Endo/Heme/Allergies: Does not bruise/bleed easily.  Psychiatric/Behavioral: Negative for depression. The patient is not nervous/anxious and does not have insomnia.       PAST MEDICAL HISTORY :  Past Medical History:  Diagnosis Date  . Diabetes mellitus without complication ([facility]  . GERD (gastroesophageal reflux disease)   . Hyperlipidemia   . Hypertension   . Multiple myeloma (HCP[facility]  . Obesity   . Thyroid cancer ([facility]  . Thyroid disease     PAST SURGICAL HISTORY :   Past Surgical History:  Procedure Laterality Date  . BONE MARROW BIOPSY  2014  . CATARACT EXTRACTION BILATERAL W/ ANTERIOR VITRECTOMY      FAMILY HISTORY :   Family History  Problem Relation Age of Onset  . Cancer Father 6362. Diabetes Mother     SOCIAL HISTORY:   Social History   Tobacco Use  . Smoking status: Never Smoker  . Smokeless tobacco: Never Used  Substance Use Topics  . Alcohol use: No  . Drug use: No    ALLERGIES:  has No Known Allergies.  MEDICATIONS:  Current Outpatient Medications  Medication Sig Dispense Refill  . amLODipine (NORVASC) 10 MG tablet Take 10 mg by mouth daily.    . Marland Kitchenspirin EC 81 MG tablet Take 81 mg by mouth daily.    .[name]  atorvastatin (LIPITOR) 20 MG tablet Take 20 mg by mouth daily.    . cloNIDine (CATAPRES) 0.1 MG tablet Take 0.1 mg by mouth 2 (two) times daily.    . Cyanocobalamin (RA VITAMIN B-12 TR) 1000 MCG TBCR Take by mouth.    . lenalidomide (REVLIMID) 15 MG capsule Take 1 capsule (15 mg total) by mouth daily for 2 weeks, then hold for 2 weeks off 14 capsule 0  . levothyroxine  (SYNTHROID) 137 MCG tablet Take 1 tablet by mouth daily.     [name] DELICA LANCETS 89V MISC Inject 1 Lancet as directed once daily.    . pantoprazole (PROTONIX) 40 MG tablet TAKE 1 TABLET BY MOUTH EVERY DAY 60 tablet 3  . pioglitazone (ACTOS) 30 MG tablet Take 30 mg by mouth daily.    . potassium chloride SA (K-DUR,KLOR-CON) 20 MEQ tablet Take 20 mEq by mouth 2 (two) times daily.   0  . quinapril (ACCUPRIL) 40 MG tablet Take 40 mg by mouth daily.     No current facility-administered medications for this visit.   Facility-Administered Medications Ordered in Other Visits  Medication Dose Route Frequency Provider Last Rate Last Admin  . 0.9 %  sodium chloride infusion   Intravenous Continuous Cammie Sickle, MD   Stopped at 12/11/14 1528    PHYSICAL EXAMINATION: ECOG PERFORMANCE STATUS: 0 - Asymptomatic  BP 130/71 (BP Location: Left Arm, Patient Position: Sitting, Cuff Size: Normal)   Pulse 64   Temp (!) 95.7 F (35.4 C) (Tympanic)   Resp 20   Wt 241 lb (109.3 kg)   SpO2 100%   BMI 34.58 kg/m   Filed Weights   06/23/19 1325  Weight: 241 lb (109.3 kg)    Physical Exam  Constitutional: He is oriented to person, place, and time and well-developed, well-nourished, and in no distress.  HENT:  Head: Normocephalic and atraumatic.  Mouth/Throat: Oropharynx is clear and moist. No oropharyngeal exudate.  Eyes: Pupils are equal, round, and reactive to light.  Cardiovascular: Normal rate and regular rhythm.  Pulmonary/Chest: No respiratory distress. He has no wheezes.  Abdominal: Soft. Bowel sounds are normal. He exhibits no distension and no mass. There is no abdominal tenderness. There is no rebound and no guarding.  Musculoskeletal:        General: No tenderness or edema. Normal range of motion.     Cervical back: Normal range of motion and neck supple.  Neurological: He is alert and oriented to person, place, and time.  Skin: Skin is warm.  Psychiatric: Affect normal.       LABORATORY DATA:  I have reviewed the data as listed    Component Value Date/Time   NA 138 06/23/2019 1244   NA 139 05/01/2014 1322   K 4.9 06/23/2019 1244   K 4.4 05/01/2014 1322   CL 108 06/23/2019 1244   CL 107 05/01/2014 1322   CO2 25 06/23/2019 1244   CO2 27 05/01/2014 1322   GLUCOSE 129 (H) 06/23/2019 1244   GLUCOSE 102 (H) 05/01/2014 1322   BUN 25 (H) 06/23/2019 1244   BUN 16 05/01/2014 1322   CREATININE 1.62 (H) 06/23/2019 1244   CREATININE 1.48 (H) 05/29/2014 0949   CALCIUM 8.7 (L) 06/23/2019 1244   CALCIUM 9.1 05/01/2014 1322   PROT 7.6 06/23/2019 1244   PROT 7.5 05/01/2014 1322   ALBUMIN 3.1 (L) 06/23/2019 1244   ALBUMIN 3.6 05/01/2014 1322   AST 15 06/23/2019 1244   AST 17 05/01/2014 1322  ALT 13 06/23/2019 1244   ALT 13 (L) 05/01/2014 1322   ALKPHOS 50 06/23/2019 1244   ALKPHOS 47 05/01/2014 1322   BILITOT 0.5 06/23/2019 1244   BILITOT 0.6 05/01/2014 1322   GFRNONAA 41 (L) 06/23/2019 1244   GFRNONAA 47 (L) 05/29/2014 0949   GFRAA 47 (L) 06/23/2019 1244   GFRAA 55 (L) 05/29/2014 0949    No results found for: SPEP, UPEP  Lab Results  Component Value Date   WBC 4.0 06/23/2019   NEUTROABS 2.3 06/23/2019   HGB 12.0 (L) 06/23/2019   HCT 36.4 (L) 06/23/2019   MCV 94.3 06/23/2019   PLT 259 06/23/2019      Chemistry      Component Value Date/Time   NA 138 06/23/2019 1244   NA 139 05/01/2014 1322   K 4.9 06/23/2019 1244   K 4.4 05/01/2014 1322   CL 108 06/23/2019 1244   CL 107 05/01/2014 1322   CO2 25 06/23/2019 1244   CO2 27 05/01/2014 1322   BUN 25 (H) 06/23/2019 1244   BUN 16 05/01/2014 1322   CREATININE 1.62 (H) 06/23/2019 1244   CREATININE 1.48 (H) 05/29/2014 0949      Component Value Date/Time   CALCIUM 8.7 (L) 06/23/2019 1244   CALCIUM 9.1 05/01/2014 1322   ALKPHOS 50 06/23/2019 1244   ALKPHOS 47 05/01/2014 1322   AST 15 06/23/2019 1244   AST 17 05/01/2014 1322   ALT 13 06/23/2019 1244   ALT 13 (L) 05/01/2014 1322    BILITOT 0.5 06/23/2019 1244   BILITOT 0.6 05/01/2014 1322       RADIOGRAPHIC STUDIES: I have personally reviewed the radiological images as listed and agreed with the findings in the report. No results found.   ASSESSMENT & PLAN:  Multiple myeloma in remission ([place]) #Multiple myeloma-most recently on maintenance Revlimid 15 mg 2  weeks on 2 week off.  March 2021 M-protine-NEG; kappa lambda light chain ratio=N; -clinically stable.  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  Stable  #Bone modifying agent/multiple myeloma-- on zometa 54m every other month stable but ca-8.7-HOLD Zometa today.  # URI- recommend antihistamine.  Do not suspect any lower respiratory tract infection.  Disposition: # HOLD Zometa # in 1 month- labs- cbc/cmp # Follow up in 2 MD;  labs (CBC, CMP, kappa lambda light chains, multiple myeloma panel),  and possible Zometa. Dr.B   No orders of the defined types were placed in this encounter.  All questions were answered. The patient knows to call the clinic with any problems, questions or concerns.      GCammie Sickle MD 06/23/2019 1:39 PM

## 2019-06-23 NOTE — Assessment & Plan Note (Addendum)
#  Multiple myeloma-most recently on maintenance Revlimid 15 mg 2  weeks on 2 week off.  March 2021 M-protine-NEG; kappa lambda light chain ratio=N; -clinically stable.  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  Stable  #Bone modifying agent/multiple myeloma-- on zometa '3mg'$  every other month stable but ca-8.7-HOLD Zometa today.  # URI- recommend antihistamine.  Do not suspect any lower respiratory tract infection.  Disposition: # HOLD Zometa # in 1 month- labs- cbc/cmp # Follow up in 2 MD;  labs (CBC, CMP, kappa lambda light chains, multiple myeloma panel),  and possible Zometa. Dr.B

## 2019-06-28 ENCOUNTER — Other Ambulatory Visit: Payer: Self-pay

## 2019-06-28 ENCOUNTER — Encounter: Payer: Self-pay | Admitting: Emergency Medicine

## 2019-06-28 ENCOUNTER — Emergency Department: Payer: Medicare HMO

## 2019-06-28 ENCOUNTER — Emergency Department
Admission: EM | Admit: 2019-06-28 | Discharge: 2019-06-28 | Disposition: A | Discharge status: Home / Self Care | Payer: Medicare HMO | Attending: Emergency Medicine | Admitting: Emergency Medicine

## 2019-06-28 DIAGNOSIS — N183 Chronic kidney disease, stage 3 unspecified: Secondary | ICD-10-CM | POA: Insufficient documentation

## 2019-06-28 DIAGNOSIS — J069 Acute upper respiratory infection, unspecified: Secondary | ICD-10-CM | POA: Insufficient documentation

## 2019-06-28 DIAGNOSIS — Z20822 Contact with and (suspected) exposure to covid-19: Secondary | ICD-10-CM | POA: Insufficient documentation

## 2019-06-28 DIAGNOSIS — I129 Hypertensive chronic kidney disease with stage 1 through stage 4 chronic kidney disease, or unspecified chronic kidney disease: Secondary | ICD-10-CM | POA: Insufficient documentation

## 2019-06-28 DIAGNOSIS — E1122 Type 2 diabetes mellitus with diabetic chronic kidney disease: Secondary | ICD-10-CM | POA: Insufficient documentation

## 2019-06-28 DIAGNOSIS — Z79899 Other long term (current) drug therapy: Secondary | ICD-10-CM | POA: Insufficient documentation

## 2019-06-28 DIAGNOSIS — K219 Gastro-esophageal reflux disease without esophagitis: Secondary | ICD-10-CM | POA: Diagnosis not present

## 2019-06-28 DIAGNOSIS — R1013 Epigastric pain: Secondary | ICD-10-CM | POA: Diagnosis present

## 2019-06-28 DIAGNOSIS — Z7984 Long term (current) use of oral hypoglycemic drugs: Secondary | ICD-10-CM | POA: Diagnosis not present

## 2019-06-28 DIAGNOSIS — Z7982 Long term (current) use of aspirin: Secondary | ICD-10-CM | POA: Diagnosis not present

## 2019-06-28 LAB — URINALYSIS, COMPLETE (UACMP) WITH MICROSCOPIC
Bacteria, UA: NONE SEEN
Bilirubin Urine: NEGATIVE
Glucose, UA: >=500 mg/dL — AB
Ketones, ur: NEGATIVE mg/dL
Leukocytes,Ua: NEGATIVE
Nitrite: NEGATIVE
Protein, ur: NEGATIVE mg/dL
Specific Gravity, Urine: 1.013 (ref 1.005–1.030)
Squamous Epithelial / HPF: NONE SEEN (ref 0–5)
pH: 5 (ref 5.0–8.0)

## 2019-06-28 LAB — CBC
HCT: 40 % (ref 39.0–52.0)
Hemoglobin: 13.2 g/dL (ref 13.0–17.0)
MCH: 30.5 pg (ref 26.0–34.0)
MCHC: 33 g/dL (ref 30.0–36.0)
MCV: 92.4 fL (ref 80.0–100.0)
Platelets: 364 10*3/uL (ref 150–400)
RBC: 4.33 MIL/uL (ref 4.22–5.81)
RDW: 15.6 % — ABNORMAL HIGH (ref 11.5–15.5)
WBC: 7.3 10*3/uL (ref 4.0–10.5)
nRBC: 0 % (ref 0.0–0.2)

## 2019-06-28 LAB — COMPREHENSIVE METABOLIC PANEL
ALT: 15 U/L (ref 0–44)
AST: 21 U/L (ref 15–41)
Albumin: 3.9 g/dL (ref 3.5–5.0)
Alkaline Phosphatase: 56 U/L (ref 38–126)
Anion gap: 7 (ref 5–15)
BUN: 20 mg/dL (ref 8–23)
CO2: 25 mmol/L (ref 22–32)
Calcium: 9.6 mg/dL (ref 8.9–10.3)
Chloride: 105 mmol/L (ref 98–111)
Creatinine, Ser: 1.28 mg/dL — ABNORMAL HIGH (ref 0.61–1.24)
GFR calc Af Amer: >60 mL/min (ref >60)
GFR calc non Af Amer: 54 mL/min — ABNORMAL LOW (ref >60)
Glucose, Bld: 183 mg/dL — ABNORMAL HIGH (ref 70–99)
Potassium: 5.3 mmol/L — ABNORMAL HIGH (ref 3.5–5.1)
Sodium: 137 mmol/L (ref 135–145)
Total Bilirubin: 0.7 mg/dL (ref 0.3–1.2)
Total Protein: 8.7 g/dL — ABNORMAL HIGH (ref 6.5–8.1)

## 2019-06-28 LAB — LIPASE, BLOOD: Lipase: 28 U/L (ref 11–51)

## 2019-06-28 LAB — SARS CORONAVIRUS 2 (TAT 6-24 HRS): SARS Coronavirus 2: NEGATIVE

## 2019-06-28 MED ORDER — ONDANSETRON 4 MG PO TBDP
4.0000 mg | ORAL_TABLET | Freq: Once | ORAL | Status: AC
  Administered 2019-06-28: 4 mg via ORAL
  Filled 2019-06-28: qty 1

## 2019-06-28 MED ORDER — AZITHROMYCIN 250 MG PO TABS
ORAL_TABLET | ORAL | 0 refills | Status: DC
Start: 2019-06-28 — End: 2019-08-25

## 2019-06-28 MED ORDER — FAMOTIDINE 20 MG PO TABS
20.0000 mg | ORAL_TABLET | Freq: Two times a day (BID) | ORAL | 0 refills | Status: DC
Start: 2019-06-28 — End: 2023-05-28

## 2019-06-28 MED ORDER — LIDOCAINE VISCOUS HCL 2 % MT SOLN
15.0000 mL | Freq: Once | OROMUCOSAL | Status: AC
  Administered 2019-06-28: 15 mL via ORAL
  Filled 2019-06-28: qty 15

## 2019-06-28 MED ORDER — FAMOTIDINE 20 MG PO TABS
20.0000 mg | ORAL_TABLET | Freq: Two times a day (BID) | ORAL | 0 refills | Status: DC
Start: 2019-06-28 — End: 2019-06-28

## 2019-06-28 MED ORDER — AZITHROMYCIN 250 MG PO TABS
ORAL_TABLET | ORAL | 0 refills | Status: DC
Start: 2019-06-28 — End: 2019-06-28

## 2019-06-28 MED ORDER — SODIUM CHLORIDE 0.9% FLUSH: 3.0000 mL | Freq: Once | INTRAVENOUS | Status: DC

## 2019-06-28 MED ORDER — ALUM & MAG HYDROXIDE-SIMETH 200-200-20 MG/5ML PO SUSP
30.0000 mL | Freq: Once | ORAL | Status: AC
  Administered 2019-06-28: 30 mL via ORAL
  Filled 2019-06-28: qty 30

## 2019-06-28 MED ORDER — IOHEXOL 300 MG/ML  SOLN
100.0000 mL | Freq: Once | INTRAMUSCULAR | Status: AC | PRN
  Administered 2019-06-28: 100 mL via INTRAVENOUS

## 2019-06-28 NOTE — ED Provider Notes (Signed)
[facility] Emergency Department Provider Note  ____________________________________________  Time seen: Approximately 12:17 PM  I have reviewed the triage vital signs and the nursing notes.   HISTORY  Chief Complaint Abdominal Pain    HPI Alan Alexander is a 74 y.o. male with PMH of diabetes, GERD, HTN, multiple myeloma, thyroid disease that presents to the emergency department for evaluation of epigastric discomfort since last night.  He has also had some nausea.  No recent illness.  He has had an intermittent cough. His grandchildren both have coughs. His primary care told him to take Claritin.  He used to be on medication for his GERD but he quit taking it.  No fevers, shortness of breath, chest pain, vomiting, diarrhea, constipation.   Past Medical History:  Diagnosis Date  . Diabetes mellitus without complication ([place])   . GERD (gastroesophageal reflux disease)   . Hyperlipidemia   . Hypertension   . Multiple myeloma ([place])   . Obesity   . Thyroid cancer ([place])   . Thyroid disease     Patient Active Problem List   Diagnosis Date Noted  . Stage 3a chronic kidney disease 12/06/2018  . Multiple myeloma in remission ([place]) 10/01/2015  . Type 2 diabetes mellitus ([place]) 09/20/2014  . HLD (hyperlipidemia) 06/10/2014  . Impaired renal function 12/05/2013  . Obstructive apnea 12/05/2013  . Adult hypothyroidism 12/05/2013  . BP (high blood pressure) 12/05/2013    Past Surgical History:  Procedure Laterality Date  . BONE MARROW BIOPSY  2014  . CATARACT EXTRACTION BILATERAL W/ ANTERIOR VITRECTOMY      Prior to Admission medications   Medication Sig Start Date End Date Taking? Authorizing Provider  amLODipine (NORVASC) 10 MG tablet Take 10 mg by mouth daily. 09/14/13   [provider]  aspirin EC 81 MG tablet Take 81 mg by mouth daily.    [provider]  atorvastatin (LIPITOR) 20 MG tablet Take 20 mg by mouth daily. 09/23/13   [provider]  azithromycin (ZITHROMAX Z-PAK) 250 MG tablet Take 2 tablets (500 mg) on  Day 1,  followed by 1 tablet (250 mg) once daily on Days 2 through 5. 06/28/19   Laban Emperor, PA-C  cloNIDine (CATAPRES) 0.1 MG tablet Take 0.1 mg by mouth 2 (two) times daily. 08/28/13   [provider]  Cyanocobalamin (RA VITAMIN B-12 TR) 1000 MCG TBCR Take by mouth.    [provider]  famotidine (PEPCID) 20 MG tablet Take 1 tablet (20 mg total) by mouth 2 (two) times daily. 06/28/19   Laban Emperor, PA-C  lenalidomide (REVLIMID) 15 MG capsule Take 1 capsule (15 mg total) by mouth daily for 2 weeks, then hold for 2 weeks off 06/20/19   Cammie Sickle, MD  levothyroxine (SYNTHROID) 137 MCG tablet Take 1 tablet by mouth daily.  10/06/13   [provider]  Jonetta Speak LANCETS 02D MISC Inject 1 Lancet as directed once daily. 09/20/14   [provider]  pantoprazole (PROTONIX) 40 MG tablet TAKE 1 TABLET BY MOUTH EVERY DAY 03/12/18   Cammie Sickle, MD  pioglitazone (ACTOS) 30 MG tablet Take 30 mg by mouth daily. 09/14/13   [provider]  potassium chloride SA (K-DUR,KLOR-CON) 20 MEQ tablet Take 20 mEq by mouth 2 (two) times daily.  12/23/14   [provider]  quinapril (ACCUPRIL) 40 MG tablet Take 40 mg by mouth daily. 09/14/13   [provider]    Allergies Patient has no known  allergies.  Family History  Problem Relation Age of Onset  . Cancer Father 74  . Diabetes Mother     Social History Social History   Tobacco Use  . Smoking status: Never Smoker  . Smokeless tobacco: Never Used  Substance Use Topics  . Alcohol use: No  . Drug use: No     Review of Systems  Constitutional: No fever/chills ENT: No upper respiratory complaints. Cardiovascular: No chest pain. Respiratory: No cough. No SOB. Gastrointestinal: Positive for epigastric discomfort.  No nausea, no vomiting.  Musculoskeletal: Negative for musculoskeletal  pain. Skin: Negative for rash, abrasions, lacerations, ecchymosis. Neurological: Negative for headaches, numbness or tingling   ____________________________________________   PHYSICAL EXAM:  VITAL SIGNS: ED Triage Vitals  Enc Vitals Group     BP 06/28/19 0957 (!) 173/80     Pulse Rate 06/28/19 0957 84     Resp 06/28/19 0957 16     Temp 06/28/19 0957 97.7 F (36.5 C)     Temp Source 06/28/19 0957 Oral     SpO2 06/28/19 0957 100 %     Weight 06/28/19 0958 240 lb (108.9 kg)     Height 06/28/19 0958 '5\' 10"'  (1.778 m)     Head Circumference --      Peak Flow --      Pain Score 06/28/19 0957 8     Pain Loc --      Pain Edu? --      Excl. in [place]? --      Constitutional: Alert and oriented. Well appearing and in no acute distress. Eyes: Conjunctivae are normal. PERRL. EOMI. Head: Atraumatic. ENT:      Ears:      Nose: No congestion/rhinnorhea.      Mouth/Throat: Mucous membranes are moist.  Neck: No stridor.   Cardiovascular: Normal rate, regular rhythm.  Good peripheral circulation. Respiratory: Normal respiratory effort without tachypnea or retractions. Lungs CTAB. Good air entry to the bases with no decreased or absent breath sounds. Gastrointestinal: Bowel sounds 4 quadrants.  Mild epigastric tenderness. No guarding or rigidity. No palpable masses. No distention.  Musculoskeletal: Full range of motion to all extremities. No gross deformities appreciated. Neurologic:  Normal speech and language. No gross focal neurologic deficits are appreciated.  Skin:  Skin is warm, dry and intact. No rash noted. Psychiatric: Mood and affect are normal. Speech and behavior are normal. Patient exhibits appropriate insight and judgement.   ____________________________________________   LABS (all labs ordered are listed, but only abnormal results are displayed)  Labs Reviewed  COMPREHENSIVE METABOLIC PANEL - Abnormal; Notable for the following components:      Result Value   Potassium  5.3 (*)    Glucose, Bld 183 (*)    Creatinine, Ser 1.28 (*)    Total Protein 8.7 (*)    GFR calc non Af Amer 54 (*)    All other components within normal limits  CBC - Abnormal; Notable for the following components:   RDW 15.6 (*)    All other components within normal limits  URINALYSIS, COMPLETE (UACMP) WITH MICROSCOPIC - Abnormal; Notable for the following components:   Color, Urine YELLOW (*)    APPearance CLEAR (*)    Glucose, UA >=500 (*)    Hgb urine dipstick SMALL (*)    All other components within normal limits  SARS CORONAVIRUS 2 (TAT 6-24 HRS)  LIPASE, BLOOD   ____________________________________________  EKG   ____________________________________________  RADIOLOGY Robinette Haines, personally viewed and evaluated these  images (plain radiographs) as part of my medical decision making, as well as reviewing the written report by the radiologist.  DG Chest 1 View  Result Date: 06/28/2019 CLINICAL DATA:  74 year old male with a history of cough EXAM: CHEST  1 VIEW COMPARISON:  None. FINDINGS: Cardiomediastinal silhouette within normal limits in size and contour. Double density projecting over the lower mediastinum with no comparison. No pneumothorax or pleural effusion. Asymmetric elevation of the right hemidiaphragm. Coarsened interstitial markings with no confluent airspace disease. No displaced fracture. IMPRESSION: Chronic lung changes without evidence of acute cardiopulmonary disease. Presumed hiatal hernia. Electronically Signed   By: Corrie Mckusick D.O.   On: 06/28/2019 12:39   CT ABDOMEN PELVIS W CONTRAST  Result Date: 06/28/2019 CLINICAL DATA:  Epigastric pain and nausea. EXAM: CT ABDOMEN AND PELVIS WITH CONTRAST TECHNIQUE: Multidetector CT imaging of the abdomen and pelvis was performed using the standard protocol following bolus administration of intravenous contrast. CONTRAST:  170m OMNIPAQUE IOHEXOL 300 MG/ML  SOLN COMPARISON:  None. FINDINGS: Lower chest:  Subpleural nodularity in the left lower lobe and less so right middle lobe. Hepatobiliary: No focal liver abnormality is seen. Bulky cholelithiasis. Question 1-2 mm gallstone within the proximal common bile duct or the cystic duct. The gallbladder is normally distended. No definite pericholecystic fluid. The common bile duct is normal in caliber. Pancreas: Unremarkable. No pancreatic ductal dilatation or surrounding inflammatory changes. Spleen: Normal in size without focal abnormality. Adrenals/Urinary Tract: Normal adrenal glands. Multiple bilateral renal cysts, including an exophytic 11 cm cyst off of the inferior pole of the left kidney. Stomach/Bowel: Small hiatal hernia. Normal appearance of the proximal stomach. Diffuse mucosal thickening in the region of the pylorus. Normal appearance of the small bowel. Scattered left colonic diverticulosis without evidence of diverticulitis. Vascular/Lymphatic: Aortic atherosclerosis. No enlarged abdominal or pelvic lymph nodes. Reproductive: Nonspecific prostate gland calcifications. Other: No abdominal wall hernia or abnormality. No abdominopelvic ascites. Musculoskeletal: Spondylosis of the lower lumbosacral spine. IMPRESSION: 1. Bulky cholelithiasis. Question 1-2 mm gallstone within the proximal common bile duct or the cystic duct. No convincing evidence of acute cholecystitis. 2. Diffuse mucosal thickening in the region of the pylorus. Nonspecific but may represent gastritis. 3. Small hiatal hernia. Fluid-filled distal esophagus consistent with GERD. 4. Scattered left colonic diverticulosis without evidence of diverticulitis. 5. Multiple bilateral renal cysts, including 11 cm exophytic left renal cyst. 6. Nonspecific subpleural nodularity of the left lower lobe and less so right middle lobe, likely inflammatory. Aortic Atherosclerosis (ICD10-I70.0). Electronically Signed   By: DFidela SalisburyM.D.   On: 06/28/2019 14:36     ____________________________________________    PROCEDURES  Procedure(s) performed:    Procedures    Medications  sodium chloride flush (NS) 0.9 % injection 3 mL (has no administration in time range)  alum & mag hydroxide-simeth (MAALOX/MYLANTA) 200-200-20 MG/5ML suspension 30 mL (30 mLs Oral Given 06/28/19 1230)    And  lidocaine (XYLOCAINE) 2 % viscous mouth solution 15 mL (15 mLs Oral Given 06/28/19 1230)  ondansetron (ZOFRAN-ODT) disintegrating tablet 4 mg (4 mg Oral Given 06/28/19 1230)  iohexol (OMNIPAQUE) 300 MG/ML solution 100 mL (100 mLs Intravenous Contrast Given 06/28/19 1400)     ____________________________________________   INITIAL IMPRESSION / ASSESSMENT AND PLAN / ED COURSE  Pertinent labs & imaging results that were available during my care of the patient were reviewed by me and considered in my medical decision making (see chart for details).  Review of the [place] CSRS was performed in accordance of the  N[employer] prior to dispensing any controlled drugs.  Differential diagnosis includes, but is not limited to, biliary disease (biliary colic, acute cholecystitis, cholangitis, choledocholithiasis, etc), intrathoracic causes for epigastric abdominal pain including ACS, gastritis, duodenitis, pancreatitis, small bowel or large bowel obstruction, abdominal aortic aneurysm, hernia, and ulcer(s).  Patient's diagnosis is consistent with URI and GERD.  Vital signs and exam are reassuring.  Patient symptoms completely resolved following GI cocktail.  No abdominal tenderness to palpation following medications.  CT scan consistent with findings of GERD. All CT findings were discussed with patient.  Patient states he has had an intermittent occasional cough.  CT scan shows some nonspecific subpleural nodularity in the left and right lower lobes, likely inflammatory, but since he has and intermittent cough, he will be covered for bacterial infection with azithromycin.  Patient will be  discharged home with prescriptions for pepcid for GERD and azithromycin. Patient is to follow up with PCP as directed. Patient is given ED precautions to return to the ED for any worsening or new symptoms.  Zebastian Carico was evaluated in Emergency Department on 06/28/2019 for the symptoms described in the history of present illness. He was evaluated in the context of the global COVID-19 pandemic, which necessitated consideration that the patient might be at risk for infection with the SARS-CoV-2 virus that causes COVID-19. Institutional protocols and algorithms that pertain to the evaluation of patients at risk for COVID-19 are in a state of rapid change based on information released by regulatory bodies including the CDC and federal and state organizations. These policies and algorithms were followed during the patient's care in the ED.   ____________________________________________  FINAL CLINICAL IMPRESSION(S) / ED DIAGNOSES  Final diagnoses:  Upper respiratory tract infection, unspecified type  Gastroesophageal reflux disease without esophagitis      NEW MEDICATIONS STARTED DURING THIS VISIT:  ED Discharge Orders         Ordered    azithromycin (ZITHROMAX Z-PAK) 250 MG tablet     06/28/19 1501    famotidine (PEPCID) 20 MG tablet  2 times daily     06/28/19 1501              This chart was dictated using voice recognition software/Dragon. Despite best efforts to proofread, errors can occur which can change the meaning. Any change was purely unintentional.    Laban Emperor, PA-C 06/28/19 1639    Carrie Mew, MD 06/29/19 2212

## 2019-06-28 NOTE — ED Triage Notes (Signed)
Pt to ED via POV c/o epigastric abd pain that started last night. Pt denies V/D but states that he has felt like he needed to vomit. Pt denies radiation of pain. Pt has hx/o acid reflux. Pt is in NAD.

## 2019-06-28 NOTE — ED Notes (Signed)
Pt verbalized understanding of discharge instructions. NAD at this time. 

## 2019-07-18 ENCOUNTER — Other Ambulatory Visit: Payer: Self-pay | Admitting: *Deleted

## 2019-07-18 DIAGNOSIS — C9001 Multiple myeloma in remission: Secondary | ICD-10-CM

## 2019-07-21 ENCOUNTER — Inpatient Hospital Stay: Payer: Medicare HMO | Attending: Internal Medicine

## 2019-07-21 ENCOUNTER — Other Ambulatory Visit: Payer: Self-pay

## 2019-07-21 DIAGNOSIS — C9001 Multiple myeloma in remission: Secondary | ICD-10-CM | POA: Insufficient documentation

## 2019-07-21 LAB — COMPREHENSIVE METABOLIC PANEL
ALT: 15 U/L (ref 0–44)
AST: 16 U/L (ref 15–41)
Albumin: 3.3 g/dL — ABNORMAL LOW (ref 3.5–5.0)
Alkaline Phosphatase: 44 U/L (ref 38–126)
Anion gap: 7 (ref 5–15)
BUN: 22 mg/dL (ref 8–23)
CO2: 23 mmol/L (ref 22–32)
Calcium: 8.7 mg/dL — ABNORMAL LOW (ref 8.9–10.3)
Chloride: 110 mmol/L (ref 98–111)
Creatinine, Ser: 1.68 mg/dL — ABNORMAL HIGH (ref 0.61–1.24)
GFR calc Af Amer: 45 mL/min — ABNORMAL LOW (ref >60)
GFR calc non Af Amer: 39 mL/min — ABNORMAL LOW (ref >60)
Glucose, Bld: 81 mg/dL (ref 70–99)
Potassium: 4.6 mmol/L (ref 3.5–5.1)
Sodium: 140 mmol/L (ref 135–145)
Total Bilirubin: 0.6 mg/dL (ref 0.3–1.2)
Total Protein: 7 g/dL (ref 6.5–8.1)

## 2019-07-21 LAB — CBC WITH DIFFERENTIAL/PLATELET
Abs Immature Granulocytes: 0 10*3/uL (ref 0.00–0.07)
Basophils Absolute: 0 10*3/uL (ref 0.0–0.1)
Basophils Relative: 1 %
Eosinophils Absolute: 0.2 10*3/uL (ref 0.0–0.5)
Eosinophils Relative: 4 %
HCT: 36.2 % — ABNORMAL LOW (ref 39.0–52.0)
Hemoglobin: 11.9 g/dL — ABNORMAL LOW (ref 13.0–17.0)
Immature Granulocytes: 0 %
Lymphocytes Relative: 31 %
Lymphs Abs: 1.1 10*3/uL (ref 0.7–4.0)
MCH: 31.1 pg (ref 26.0–34.0)
MCHC: 32.9 g/dL (ref 30.0–36.0)
MCV: 94.5 fL (ref 80.0–100.0)
Monocytes Absolute: 0.6 10*3/uL (ref 0.1–1.0)
Monocytes Relative: 17 %
Neutro Abs: 1.7 10*3/uL (ref 1.7–7.7)
Neutrophils Relative %: 47 %
Platelets: 146 10*3/uL — ABNORMAL LOW (ref 150–400)
RBC: 3.83 MIL/uL — ABNORMAL LOW (ref 4.22–5.81)
RDW: 15.9 % — ABNORMAL HIGH (ref 11.5–15.5)
WBC: 3.6 10*3/uL — ABNORMAL LOW (ref 4.0–10.5)
nRBC: 0 % (ref 0.0–0.2)

## 2019-07-21 MED ORDER — LENALIDOMIDE 15 MG PO CAPS: ORAL_CAPSULE | ORAL | 0 refills | Status: DC

## 2019-08-15 ENCOUNTER — Other Ambulatory Visit: Payer: Self-pay | Admitting: *Deleted

## 2019-08-15 DIAGNOSIS — C9001 Multiple myeloma in remission: Secondary | ICD-10-CM

## 2019-08-18 MED ORDER — LENALIDOMIDE 15 MG PO CAPS: ORAL_CAPSULE | ORAL | 0 refills | Status: DC

## 2019-08-22 DIAGNOSIS — I129 Hypertensive chronic kidney disease with stage 1 through stage 4 chronic kidney disease, or unspecified chronic kidney disease: Secondary | ICD-10-CM | POA: Insufficient documentation

## 2019-08-25 ENCOUNTER — Other Ambulatory Visit: Payer: Self-pay

## 2019-08-25 ENCOUNTER — Other Ambulatory Visit: Payer: Medicare HMO

## 2019-08-26 ENCOUNTER — Inpatient Hospital Stay (HOSPITAL_BASED_OUTPATIENT_CLINIC_OR_DEPARTMENT_OTHER): Payer: Medicare HMO | Admitting: Internal Medicine

## 2019-08-26 ENCOUNTER — Inpatient Hospital Stay: Payer: Medicare HMO

## 2019-08-26 ENCOUNTER — Inpatient Hospital Stay: Payer: Medicare HMO | Attending: Internal Medicine

## 2019-08-26 DIAGNOSIS — E785 Hyperlipidemia, unspecified: Secondary | ICD-10-CM | POA: Diagnosis not present

## 2019-08-26 DIAGNOSIS — R600 Localized edema: Secondary | ICD-10-CM | POA: Diagnosis not present

## 2019-08-26 DIAGNOSIS — Z7982 Long term (current) use of aspirin: Secondary | ICD-10-CM | POA: Diagnosis not present

## 2019-08-26 DIAGNOSIS — E1122 Type 2 diabetes mellitus with diabetic chronic kidney disease: Secondary | ICD-10-CM | POA: Diagnosis not present

## 2019-08-26 DIAGNOSIS — Z79899 Other long term (current) drug therapy: Secondary | ICD-10-CM | POA: Insufficient documentation

## 2019-08-26 DIAGNOSIS — K219 Gastro-esophageal reflux disease without esophagitis: Secondary | ICD-10-CM | POA: Insufficient documentation

## 2019-08-26 DIAGNOSIS — I129 Hypertensive chronic kidney disease with stage 1 through stage 4 chronic kidney disease, or unspecified chronic kidney disease: Secondary | ICD-10-CM | POA: Diagnosis not present

## 2019-08-26 DIAGNOSIS — N183 Chronic kidney disease, stage 3 unspecified: Secondary | ICD-10-CM | POA: Insufficient documentation

## 2019-08-26 DIAGNOSIS — C9001 Multiple myeloma in remission: Secondary | ICD-10-CM

## 2019-08-26 LAB — CBC WITH DIFFERENTIAL/PLATELET
Abs Immature Granulocytes: 0.01 10*3/uL (ref 0.00–0.07)
Basophils Absolute: 0 10*3/uL (ref 0.0–0.1)
Basophils Relative: 1 %
Eosinophils Absolute: 0.1 10*3/uL (ref 0.0–0.5)
Eosinophils Relative: 6 %
HCT: 37.4 % — ABNORMAL LOW (ref 39.0–52.0)
Hemoglobin: 12.7 g/dL — ABNORMAL LOW (ref 13.0–17.0)
Immature Granulocytes: 1 %
Lymphocytes Relative: 42 %
Lymphs Abs: 0.9 10*3/uL (ref 0.7–4.0)
MCH: 31.6 pg (ref 26.0–34.0)
MCHC: 34 g/dL (ref 30.0–36.0)
MCV: 93 fL (ref 80.0–100.0)
Monocytes Absolute: 0.5 10*3/uL (ref 0.1–1.0)
Monocytes Relative: 22 %
Neutro Abs: 0.6 10*3/uL — ABNORMAL LOW (ref 1.7–7.7)
Neutrophils Relative %: 28 %
Platelets: 232 10*3/uL (ref 150–400)
RBC: 4.02 MIL/uL — ABNORMAL LOW (ref 4.22–5.81)
RDW: 15.1 % (ref 11.5–15.5)
WBC: 2.2 10*3/uL — ABNORMAL LOW (ref 4.0–10.5)
nRBC: 0 % (ref 0.0–0.2)

## 2019-08-26 LAB — COMPREHENSIVE METABOLIC PANEL
ALT: 19 U/L (ref 0–44)
AST: 22 U/L (ref 15–41)
Albumin: 3.5 g/dL (ref 3.5–5.0)
Alkaline Phosphatase: 54 U/L (ref 38–126)
Anion gap: 4 — ABNORMAL LOW (ref 5–15)
BUN: 20 mg/dL (ref 8–23)
CO2: 26 mmol/L (ref 22–32)
Calcium: 8.8 mg/dL — ABNORMAL LOW (ref 8.9–10.3)
Chloride: 108 mmol/L (ref 98–111)
Creatinine, Ser: 1.38 mg/dL — ABNORMAL HIGH (ref 0.61–1.24)
GFR calc Af Amer: 58 mL/min — ABNORMAL LOW (ref >60)
GFR calc non Af Amer: 50 mL/min — ABNORMAL LOW (ref >60)
Glucose, Bld: 108 mg/dL — ABNORMAL HIGH (ref 70–99)
Potassium: 4.5 mmol/L (ref 3.5–5.1)
Sodium: 138 mmol/L (ref 135–145)
Total Bilirubin: 0.7 mg/dL (ref 0.3–1.2)
Total Protein: 7 g/dL (ref 6.5–8.1)

## 2019-08-26 MED ORDER — ZOLEDRONIC ACID 4 MG/5ML IV CONC
3.0000 mg | Freq: Once | INTRAVENOUS | Status: AC
  Administered 2019-08-26: 3 mg via INTRAVENOUS
  Filled 2019-08-26: qty 3.75

## 2019-08-26 MED ORDER — SODIUM CHLORIDE 0.9 % IV SOLN
INTRAVENOUS | Status: DC
  Filled 2019-08-26: qty 250

## 2019-08-26 NOTE — Assessment & Plan Note (Addendum)
#  Multiple myeloma-most recently on maintenance Revlimid 15 mg 2  weeks on 2 week off.  March 2021 M-protine-NEG; kappa lambda light chain ratio=1.74;-clinically STABLE; await MM labs from today.  # -Start a new cycle today; HOLD revlimid- ANC 0.6. [name]  Recommend holding the cycle.  Will reevaluate in 1 month.  # CKD-III- GFR 58; (baseline creatinine 1.3-1.4; peak 1.8).  Stable  #Bone modifying agent/multiple myeloma-- on zometa 74m every other month stable but ca-8.8-proceed with Zometa today.on ca+vit D BID.   Disposition: # proceed with Zometa today # Follow up in 4 weeks-  MD;  labs (CBC, CMP, kappa lambda light chains, multiple myeloma panel),  and possible Zometa. Dr.B

## 2019-08-26 NOTE — Progress Notes (Signed)
[name] OFFICE PROGRESS NOTE  Patient Care Team: Alan Hire, MD as PCP - General (Internal Medicine) Alan Brownie, MD as Consulting Physician (Dermatology) Alan Sickle, MD as Consulting Physician (Hematology and Oncology)  Cancer Staging No matching staging information was found for the patient.   Oncology History Overview Note   # April 2014- MULTIPLE MYELOMA [IgG- 2.2gm/dl; 40-50% plasma cell; Cyto-N; FISH- gain of chr.9, 15 & ccnd1/11q13] s/p RVD x6 [sep 2014; BMBx- ~5% plasma cells]; Maint Rev-DEX-Zometa; March 2015-Stopped Dex Cont- Rev-Zometa; July 2016- Rev '25mg'$ ; NOV 4th  2016-HOLD; BMT eval at [facility] [Dr.Woods]; declined BMT.   # Re-START FEB 2017 Rev '15mg'$  3 W- on & 1 w OFF; HELD Rev June- Oct 2020- sec to worsening renal function [Dr.Singh]; SEP 2020- Rev 15 mg 2 w-On & 2 w-OFF.   # Zometa  # CKD [creat ~1.4; sec MM; Dr.Singh]; DM  -------------------------------------------------------------------------   DIAGNOSIS: Alan Alexander ] MULTIPLE MYELOMA  GOALS: control  CURRENT/MOST RECENT THERAPY- REVLIMID Maintenance [Feb 2017]    Multiple myeloma in remission ([place])  10/01/2015 Initial Diagnosis   Multiple myeloma in remission ([place])     INTERVAL HISTORY:  Alan Alexander 74 y.o.  male pleasant patient above history of multiple myeloma most recently on Revlimid maintenance is here for follow-up.  Interim evaluated by nephrology-recommended "fluid pill" for mild bilateral lower extremity swelling/retention.  Not started yet.  Patient denies any worsening joint pains or back pain.  Denies any nausea vomiting fevers or chills.  Denies any diarrhea.  No skin rash.   Review of Systems  Constitutional: Negative for chills, diaphoresis, fever, malaise/fatigue and weight loss.  HENT: Negative for nosebleeds and sore throat.   Eyes: Negative for double vision.  Respiratory: Negative for cough, hemoptysis, sputum production, shortness of breath and  wheezing.   Cardiovascular: Positive for leg swelling. Negative for chest pain, palpitations and orthopnea.  Gastrointestinal: Negative for abdominal pain, blood in stool, constipation, diarrhea, heartburn, melena, nausea and vomiting.  Genitourinary: Negative for dysuria, frequency and urgency.  Musculoskeletal: Negative for back pain and joint pain.  Skin: Negative.  Negative for itching and rash.  Neurological: Negative for dizziness, tingling, focal weakness, weakness and headaches.  Endo/Heme/Allergies: Does not bruise/bleed easily.  Psychiatric/Behavioral: Negative for depression. The patient is not nervous/anxious and does not have insomnia.       PAST MEDICAL HISTORY :  Past Medical History:  Diagnosis Date  . Diabetes mellitus without complication ([place])   . GERD (gastroesophageal reflux disease)   . Hyperlipidemia   . Hypertension   . Multiple myeloma ([place])   . Obesity   . Thyroid cancer ([place])   . Thyroid disease     PAST SURGICAL HISTORY :   Past Surgical History:  Procedure Laterality Date  . BONE MARROW BIOPSY  2014  . CATARACT EXTRACTION BILATERAL W/ ANTERIOR VITRECTOMY      FAMILY HISTORY :   Family History  Problem Relation Age of Onset  . Cancer Father 49  . Diabetes Mother     SOCIAL HISTORY:   Social History   Tobacco Use  . Smoking status: Never Smoker  . Smokeless tobacco: Never Used  Substance Use Topics  . Alcohol use: No  . Drug use: No    ALLERGIES:  has No Known Allergies.  MEDICATIONS:  Current Outpatient Medications  Medication Sig Dispense Refill  . amLODipine (NORVASC) 10 MG tablet Take 10 mg by mouth daily.    [name] aspirin EC 81 MG  tablet Take 81 mg by mouth daily.    [name] atorvastatin (LIPITOR) 20 MG tablet Take 20 mg by mouth daily.    . cloNIDine (CATAPRES) 0.1 MG tablet Take 0.1 mg by mouth 2 (two) times daily.    . Cyanocobalamin (RA VITAMIN B-12 TR) 1000 MCG TBCR Take by mouth.    . famotidine (PEPCID) 20 MG tablet Take 1  tablet (20 mg total) by mouth 2 (two) times daily. 14 tablet 0  . [facility] (REVLIMID) 15 MG capsule Take 1 capsule (15 mg total) by mouth daily for 2 weeks, then hold for 2 weeks off 14 capsule 0  . levothyroxine (SYNTHROID) 137 MCG tablet Take 1 tablet by mouth daily.     Alan Alexander DELICA LANCETS 60V MISC Inject 1 Lancet as directed once daily.    . pantoprazole (PROTONIX) 40 MG tablet TAKE 1 TABLET BY MOUTH EVERY DAY 60 tablet 3  . pioglitazone (ACTOS) 30 MG tablet Take 30 mg by mouth daily.    . potassium chloride SA (K-DUR,KLOR-CON) 20 MEQ tablet Take 20 mEq by mouth 2 (two) times daily.   0  . quinapril (ACCUPRIL) 40 MG tablet Take 40 mg by mouth daily.     No current facility-administered medications for this visit.   Facility-Administered Medications Ordered in Other Visits  Medication Dose Route Frequency Provider Last Rate Last Admin  . 0.9 %  sodium chloride infusion   Intravenous Continuous Alan Sickle, MD   Stopped at 12/11/14 1528  . 0.9 %  sodium chloride infusion   Intravenous Continuous Alan Sickle, MD 20 mL/hr at 08/26/19 1353 New Bag at 08/26/19 1353    PHYSICAL EXAMINATION: ECOG PERFORMANCE STATUS: 0 - Asymptomatic  BP 138/73 (BP Location: Right Arm, Patient Position: Sitting)   Pulse 64   Temp (!) 97 F (36.1 C) (Tympanic)   Resp 16   Wt 242 lb 9.6 oz (110 kg)   SpO2 100%   BMI 34.81 kg/m   Filed Weights   08/26/19 1315  Weight: 242 lb 9.6 oz (110 kg)    Physical Exam HENT:     Head: Normocephalic and atraumatic.     Mouth/Throat:     Pharynx: No oropharyngeal exudate.  Eyes:     Pupils: Pupils are equal, round, and reactive to light.  Cardiovascular:     Rate and Rhythm: Normal rate and regular rhythm.  Pulmonary:     Effort: No respiratory distress.     Breath sounds: No wheezing.  Abdominal:     General: Bowel sounds are normal. There is no distension.     Palpations: Abdomen is soft. There is no mass.     Tenderness:  There is no abdominal tenderness. There is no guarding or rebound.  Musculoskeletal:        General: No tenderness. Normal range of motion.     Cervical back: Normal range of motion and neck supple.  Skin:    General: Skin is warm.  Neurological:     Mental Status: He is alert and oriented to person, place, and time.  Psychiatric:        Mood and Affect: Affect normal.       LABORATORY DATA:  I have reviewed the data as listed    Component Value Date/Time   NA 138 08/26/2019 1259   NA 139 05/01/2014 1322   K 4.5 08/26/2019 1259   K 4.4 05/01/2014 1322   CL 108 08/26/2019 1259   CL 107 05/01/2014  1322   CO2 26 08/26/2019 1259   CO2 27 05/01/2014 1322   GLUCOSE 108 (H) 08/26/2019 1259   GLUCOSE 102 (H) 05/01/2014 1322   BUN 20 08/26/2019 1259   BUN 16 05/01/2014 1322   CREATININE 1.38 (H) 08/26/2019 1259   CREATININE 1.48 (H) 05/29/2014 0949   CALCIUM 8.8 (L) 08/26/2019 1259   CALCIUM 9.1 05/01/2014 1322   PROT 7.0 08/26/2019 1259   PROT 7.5 05/01/2014 1322   ALBUMIN 3.5 08/26/2019 1259   ALBUMIN 3.6 05/01/2014 1322   AST 22 08/26/2019 1259   AST 17 05/01/2014 1322   ALT 19 08/26/2019 1259   ALT 13 (L) 05/01/2014 1322   ALKPHOS 54 08/26/2019 1259   ALKPHOS 47 05/01/2014 1322   BILITOT 0.7 08/26/2019 1259   BILITOT 0.6 05/01/2014 1322   GFRNONAA 50 (L) 08/26/2019 1259   GFRNONAA 47 (L) 05/29/2014 0949   GFRAA 58 (L) 08/26/2019 1259   GFRAA 55 (L) 05/29/2014 0949    No results found for: SPEP, UPEP  Lab Results  Component Value Date   WBC 2.2 (L) 08/26/2019   NEUTROABS 0.6 (L) 08/26/2019   HGB 12.7 (L) 08/26/2019   HCT 37.4 (L) 08/26/2019   MCV 93.0 08/26/2019   PLT 232 08/26/2019      Chemistry      Component Value Date/Time   NA 138 08/26/2019 1259   NA 139 05/01/2014 1322   K 4.5 08/26/2019 1259   K 4.4 05/01/2014 1322   CL 108 08/26/2019 1259   CL 107 05/01/2014 1322   CO2 26 08/26/2019 1259   CO2 27 05/01/2014 1322   BUN 20 08/26/2019 1259    BUN 16 05/01/2014 1322   CREATININE 1.38 (H) 08/26/2019 1259   CREATININE 1.48 (H) 05/29/2014 0949      Component Value Date/Time   CALCIUM 8.8 (L) 08/26/2019 1259   CALCIUM 9.1 05/01/2014 1322   ALKPHOS 54 08/26/2019 1259   ALKPHOS 47 05/01/2014 1322   AST 22 08/26/2019 1259   AST 17 05/01/2014 1322   ALT 19 08/26/2019 1259   ALT 13 (L) 05/01/2014 1322   BILITOT 0.7 08/26/2019 1259   BILITOT 0.6 05/01/2014 1322       RADIOGRAPHIC STUDIES: I have personally reviewed the radiological images as listed and agreed with the findings in the report. No results found.   ASSESSMENT & PLAN:  Multiple myeloma in remission ([place]) #Multiple myeloma-most recently on maintenance Revlimid 15 mg 2  weeks on 2 week off.  March 2021 M-protine-NEG; kappa lambda light chain ratio=1.74;-clinically STABLE; await MM labs from today.  # -Start a new cycle today; HOLD revlimid- ANC 0.6. [name]  Recommend holding the cycle.  Will reevaluate in 1 month.  # CKD-III- GFR 58; (baseline creatinine 1.3-1.4; peak 1.8).  Stable  #Bone modifying agent/multiple myeloma-- on zometa 48m every other month stable but ca-8.8-proceed with Zometa today.on ca+vit D BID.   Disposition: # proceed with Zometa today # Follow up in 4 weeks-  MD;  labs (CBC, CMP, kappa lambda light chains, multiple myeloma panel),  and possible Zometa. Dr.B   Orders Placed This Encounter  Procedures  . CBC with Differential/Platelet    Standing Status:   Future    Standing Expiration Date:   08/25/2020  . Comprehensive metabolic panel    Standing Status:   Future    Standing Expiration Date:   08/25/2020  . Kappa/lambda light chains    Standing Status:   Future  Standing Expiration Date:   08/25/2020  . Multiple Myeloma Panel (SPEP&IFE w/QIG)    Standing Status:   Future    Standing Expiration Date:   08/25/2020   All questions were answered. The patient knows to call the clinic with any problems, questions or concerns.      Alan Sickle, MD 08/26/2019 2:30 PM

## 2019-08-26 NOTE — Patient Instructions (Signed)
HOLD REVLIMID THIS CYCLE; UNTIL NEXT VISIT.

## 2019-08-27 LAB — KAPPA/LAMBDA LIGHT CHAINS
Kappa free light chain: 57.9 mg/L — ABNORMAL HIGH (ref 3.3–19.4)
Kappa, lambda light chain ratio: 1.87 — ABNORMAL HIGH (ref 0.26–1.65)
Lambda free light chains: 30.9 mg/L — ABNORMAL HIGH (ref 5.7–26.3)

## 2019-08-29 LAB — MULTIPLE MYELOMA PANEL, SERUM
Albumin SerPl Elph-Mcnc: 3.6 g/dL (ref 2.9–4.4)
Albumin/Glob SerPl: 1.2 (ref 0.7–1.7)
Alpha 1: 0.2 g/dL (ref 0.0–0.4)
Alpha2 Glob SerPl Elph-Mcnc: 0.6 g/dL (ref 0.4–1.0)
B-Globulin SerPl Elph-Mcnc: 1.1 g/dL (ref 0.7–1.3)
Gamma Glob SerPl Elph-Mcnc: 1.4 g/dL (ref 0.4–1.8)
Globulin, Total: 3.2 g/dL (ref 2.2–3.9)
IgA: 602 mg/dL — ABNORMAL HIGH (ref 61–437)
IgG (Immunoglobin G), Serum: 1365 mg/dL (ref 603–1613)
IgM (Immunoglobulin M), Srm: 29 mg/dL (ref 15–143)
Total Protein ELP: 6.8 g/dL (ref 6.0–8.5)

## 2019-09-15 ENCOUNTER — Other Ambulatory Visit: Payer: Self-pay | Admitting: *Deleted

## 2019-09-15 DIAGNOSIS — C9001 Multiple myeloma in remission: Secondary | ICD-10-CM

## 2019-09-15 MED ORDER — LENALIDOMIDE 15 MG PO CAPS: ORAL_CAPSULE | ORAL | 0 refills | Status: DC

## 2019-09-23 ENCOUNTER — Inpatient Hospital Stay: Payer: Medicare HMO

## 2019-09-23 ENCOUNTER — Other Ambulatory Visit: Payer: Self-pay

## 2019-09-23 ENCOUNTER — Inpatient Hospital Stay: Payer: Medicare HMO | Attending: Internal Medicine

## 2019-09-23 ENCOUNTER — Encounter: Payer: Self-pay | Admitting: Internal Medicine

## 2019-09-23 ENCOUNTER — Inpatient Hospital Stay (HOSPITAL_BASED_OUTPATIENT_CLINIC_OR_DEPARTMENT_OTHER): Payer: Medicare HMO | Admitting: Internal Medicine

## 2019-09-23 DIAGNOSIS — C9001 Multiple myeloma in remission: Secondary | ICD-10-CM | POA: Insufficient documentation

## 2019-09-23 DIAGNOSIS — Z809 Family history of malignant neoplasm, unspecified: Secondary | ICD-10-CM | POA: Diagnosis not present

## 2019-09-23 DIAGNOSIS — M7989 Other specified soft tissue disorders: Secondary | ICD-10-CM | POA: Diagnosis not present

## 2019-09-23 DIAGNOSIS — Z79899 Other long term (current) drug therapy: Secondary | ICD-10-CM | POA: Insufficient documentation

## 2019-09-23 DIAGNOSIS — Z85850 Personal history of malignant neoplasm of thyroid: Secondary | ICD-10-CM | POA: Insufficient documentation

## 2019-09-23 DIAGNOSIS — E1122 Type 2 diabetes mellitus with diabetic chronic kidney disease: Secondary | ICD-10-CM | POA: Insufficient documentation

## 2019-09-23 DIAGNOSIS — N183 Chronic kidney disease, stage 3 unspecified: Secondary | ICD-10-CM | POA: Diagnosis not present

## 2019-09-23 DIAGNOSIS — Z833 Family history of diabetes mellitus: Secondary | ICD-10-CM | POA: Insufficient documentation

## 2019-09-23 DIAGNOSIS — D849 Immunodeficiency, unspecified: Secondary | ICD-10-CM | POA: Diagnosis not present

## 2019-09-23 LAB — CBC WITH DIFFERENTIAL/PLATELET
Abs Immature Granulocytes: 0.01 10*3/uL (ref 0.00–0.07)
Basophils Absolute: 0 10*3/uL (ref 0.0–0.1)
Basophils Relative: 0 %
Eosinophils Absolute: 0.1 10*3/uL (ref 0.0–0.5)
Eosinophils Relative: 2 %
HCT: 39.2 % (ref 39.0–52.0)
Hemoglobin: 13 g/dL (ref 13.0–17.0)
Immature Granulocytes: 0 %
Lymphocytes Relative: 27 %
Lymphs Abs: 1 10*3/uL (ref 0.7–4.0)
MCH: 31.4 pg (ref 26.0–34.0)
MCHC: 33.2 g/dL (ref 30.0–36.0)
MCV: 94.7 fL (ref 80.0–100.0)
Monocytes Absolute: 0.4 10*3/uL (ref 0.1–1.0)
Monocytes Relative: 12 %
Neutro Abs: 2.1 10*3/uL (ref 1.7–7.7)
Neutrophils Relative %: 59 %
Platelets: 243 10*3/uL (ref 150–400)
RBC: 4.14 MIL/uL — ABNORMAL LOW (ref 4.22–5.81)
RDW: 14.6 % (ref 11.5–15.5)
WBC: 3.7 10*3/uL — ABNORMAL LOW (ref 4.0–10.5)
nRBC: 0 % (ref 0.0–0.2)

## 2019-09-23 LAB — COMPREHENSIVE METABOLIC PANEL
ALT: 16 U/L (ref 0–44)
AST: 18 U/L (ref 15–41)
Albumin: 3.7 g/dL (ref 3.5–5.0)
Alkaline Phosphatase: 51 U/L (ref 38–126)
Anion gap: 4 — ABNORMAL LOW (ref 5–15)
BUN: 20 mg/dL (ref 8–23)
CO2: 26 mmol/L (ref 22–32)
Calcium: 9 mg/dL (ref 8.9–10.3)
Chloride: 108 mmol/L (ref 98–111)
Creatinine, Ser: 1.53 mg/dL — ABNORMAL HIGH (ref 0.61–1.24)
GFR calc Af Amer: 51 mL/min — ABNORMAL LOW (ref >60)
GFR calc non Af Amer: 44 mL/min — ABNORMAL LOW (ref >60)
Glucose, Bld: 93 mg/dL (ref 70–99)
Potassium: 5.4 mmol/L — ABNORMAL HIGH (ref 3.5–5.1)
Sodium: 138 mmol/L (ref 135–145)
Total Bilirubin: 0.6 mg/dL (ref 0.3–1.2)
Total Protein: 7.7 g/dL (ref 6.5–8.1)

## 2019-09-23 MED ORDER — ZOLEDRONIC ACID 4 MG/5ML IV CONC
3.0000 mg | Freq: Once | INTRAVENOUS | Status: AC
  Administered 2019-09-23: 3 mg via INTRAVENOUS
  Filled 2019-09-23: qty 3.75

## 2019-09-23 MED ORDER — SODIUM CHLORIDE 0.9 % IV SOLN
INTRAVENOUS | Status: DC
  Filled 2019-09-23: qty 250

## 2019-09-23 NOTE — Assessment & Plan Note (Addendum)
#  Multiple myeloma-most recently on maintenance Revlimid 15 mg 2  weeks on 2 week off.  JULY 2021 M-protine-NEG; kappa lambda light chain ratio=1.74;-clinically STABLE; await MM labs from today.  # RE-START revlimid 15 mg 2 weeks; 2 weeks OFF [held sec to ANC-0.6]; Today ANC-2.1.   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 STABLE.   #Bone modifying agent/multiple myeloma-- on zometa 59m every other month; STABLE; but ca-9.0--proceed with Zometa today.on ca+vit D BID.  #Patient is immunocompromised because of multiple myeloma /on get Revlimid.  Discussed re: Booster covid vaccination; pt is interested.  Information given.  Disposition: # proceed with Zometa today # labs- cbc/cmp- # Follow up in 8 weeks-  MD;  labs (CBC, CMP, kappa lambda light chains, multiple myeloma panel),  and possible Zometa. Dr.B

## 2019-09-23 NOTE — Progress Notes (Signed)
[place] OFFICE PROGRESS NOTE  Patient Care Team: Alan Hire, MD as PCP - General (Internal Medicine) Alan Brownie, MD as Consulting Physician (Dermatology) Alan Sickle, MD as Consulting Physician (Hematology and Oncology)  Cancer Staging No matching staging information was found for the patient.   Oncology History Overview Note   # April 2014- MULTIPLE MYELOMA [IgG- 2.2gm/dl; 40-50% plasma cell; Cyto-N; FISH- gain of chr.9, 15 & ccnd1/11q13] s/p RVD x6 [sep 2014; BMBx- ~5% plasma cells]; Maint Rev-DEX-Zometa; March 2015-Stopped Dex Cont- Rev-Zometa; July 2016- Rev 43m; NOV 4th  2016-HOLD; [facility] [place] declined BMT.   # Re-START FEB 2017 Rev 121m3 W- on & 1 w OFF; HELD Rev June- Oct 2020- sec to worsening renal function [Dr.Singh]; SEP 2020- Rev 15 mg 2 w-On & 2 w-OFF.   # Zometa  # CKD [creat ~1.4; sec MM; Dr.Singh]; DM  -------------------------------------------------------------------------   DIAGNOSIS: [2[name] MULTIPLE MYELOMA  GOALS: control  CURRENT/MOST RECENT THERAPY- REVLIMID Maintenance [Feb 2017]    Multiple myeloma in remission ([facility] 10/01/2015 Initial Diagnosis   [place] in remission ([facility]    INTERVAL HISTORY:  [name].o.  male pleasant patient above history of multiple myeloma most recently on Revlimid maintenance is here for follow-up.  Patient's Revlimid was held last month because of ANC 600.  Patient denies any fevers or chills.  No nausea no vomiting.  No headaches.  No diarrhea.  No skin rash.   Review of Systems  Constitutional: Negative for chills, diaphoresis, fever, malaise/fatigue and weight loss.  HENT: Negative for nosebleeds and sore throat.   Eyes: Negative for double vision.  Respiratory: Negative for cough, hemoptysis, sputum production, shortness of breath and wheezing.   Cardiovascular: Positive for leg swelling. Negative for chest pain, palpitations and  orthopnea.  Gastrointestinal: Negative for abdominal pain, blood in stool, constipation, diarrhea, heartburn, melena, nausea and vomiting.  Genitourinary: Negative for dysuria, frequency and urgency.  Musculoskeletal: Negative for back pain and joint pain.  Skin: Negative.  Negative for itching and rash.  Neurological: Negative for dizziness, tingling, focal weakness, weakness and headaches.  Endo/Heme/Allergies: Does not bruise/bleed easily.  Psychiatric/Behavioral: Negative for depression. The patient is not nervous/anxious and does not have insomnia.       PAST MEDICAL HISTORY :  Past Medical History:  Diagnosis Date  . Diabetes mellitus without complication ([facility]  . GERD (gastroesophageal reflux disease)   . Hyperlipidemia   . Hypertension   . Multiple myeloma ([facility]  . Obesity   . Thyroid cancer ([facility]  . Thyroid disease     PAST SURGICAL HISTORY :   Past Surgical History:  Procedure Laterality Date  . BONE MARROW BIOPSY  2014  . CATARACT EXTRACTION BILATERAL W/ ANTERIOR VITRECTOMY      FAMILY HISTORY :   Family History  Problem Relation Age of Onset  . Cancer Father [id number]. Diabetes Mother     SOCIAL HISTORY:   Social History   Tobacco Use  . Smoking status: Never Smoker  . Smokeless tobacco: Never Used  Substance Use Topics  . Alcohol use: No  . Drug use: No    ALLERGIES:  has No Known Allergies.  MEDICATIONS:  Current Outpatient Medications  Medication Sig Dispense Refill  . amLODipine (NORVASC) 10 MG tablet Take 10 mg by mouth daily.    . Marland Kitchenspirin EC 81 MG tablet Take 81 mg by mouth daily.    .[name]  atorvastatin (LIPITOR) 20 MG tablet Take 20 mg by mouth daily.    . cloNIDine (CATAPRES) 0.1 MG tablet Take 0.1 mg by mouth 2 (two) times daily.    . Cyanocobalamin (RA VITAMIN B-12 TR) 1000 MCG TBCR Take by mouth.    . famotidine (PEPCID) 20 MG tablet Take 1 tablet (20 mg total) by mouth 2 (two) times daily. 14 tablet 0  . lenalidomide (REVLIMID) 15 MG  capsule Take 1 capsule (15 mg total) by mouth daily for 2 weeks, then hold for 2 weeks off 14 capsule 0  . levothyroxine (SYNTHROID) 137 MCG tablet Take 1 tablet by mouth daily.     Alan Alexander DELICA LANCETS 37S MISC Inject 1 Lancet as directed once daily.    . pantoprazole (PROTONIX) 40 MG tablet TAKE 1 TABLET BY MOUTH EVERY DAY 60 tablet 3  . pioglitazone (ACTOS) 30 MG tablet Take 30 mg by mouth daily.    . potassium chloride SA (K-DUR,KLOR-CON) 20 MEQ tablet Take 20 mEq by mouth 2 (two) times daily.   0  . quinapril (ACCUPRIL) 40 MG tablet Take 40 mg by mouth daily.     No current facility-administered medications for this visit.   Facility-Administered Medications Ordered in Other Visits  Medication Dose Route Frequency Provider Last Rate Last Admin  . 0.9 %  sodium chloride infusion   Intravenous Continuous Alan Sickle, MD   Stopped at 12/11/14 1528  . 0.9 %  sodium chloride infusion   Intravenous Continuous Alan Sickle, MD 10 mL/hr at 09/23/19 1359 New Bag at 09/23/19 1359  . zolendronic acid (ZOMETA) 3 mg in sodium chloride 0.9 % 100 mL IVPB  3 mg Intravenous Once Alan Au, NP        PHYSICAL EXAMINATION: ECOG PERFORMANCE STATUS: 0 - Asymptomatic  BP (!) 145/71 (BP Location: Left Arm, Patient Position: Sitting, Cuff Size: Large)   Pulse (!) 57   Temp (!) 96.9 F (36.1 C) (Tympanic)   Resp 16   Ht '5\' 10"'  (1.778 m)   Wt 244 lb 12.8 oz (111 kg)   SpO2 100%   BMI 35.13 kg/m   Filed Weights   09/23/19 1315  Weight: 244 lb 12.8 oz (111 kg)    Physical Exam HENT:     Head: Normocephalic and atraumatic.     Mouth/Throat:     Pharynx: No oropharyngeal exudate.  Eyes:     Pupils: Pupils are equal, round, and reactive to light.  Cardiovascular:     Rate and Rhythm: Normal rate and regular rhythm.  Pulmonary:     Effort: No respiratory distress.     Breath sounds: No wheezing.  Abdominal:     General: Bowel sounds are normal. There is no  distension.     Palpations: Abdomen is soft. There is no mass.     Tenderness: There is no abdominal tenderness. There is no guarding or rebound.  Musculoskeletal:        General: No tenderness. Normal range of motion.     Cervical back: Normal range of motion and neck supple.  Skin:    General: Skin is warm.  Neurological:     Mental Status: He is alert and oriented to person, place, and time.  Psychiatric:        Mood and Affect: Affect normal.       LABORATORY DATA:  I have reviewed the data as listed    Component Value Date/Time   NA 138 09/23/2019 1248  NA 139 05/01/2014 1322   K 5.4 (H) 09/23/2019 1248   K 4.4 05/01/2014 1322   CL 108 09/23/2019 1248   CL 107 05/01/2014 1322   CO2 26 09/23/2019 1248   CO2 27 05/01/2014 1322   GLUCOSE 93 09/23/2019 1248   GLUCOSE 102 (H) 05/01/2014 1322   BUN 20 09/23/2019 1248   BUN 16 05/01/2014 1322   CREATININE 1.53 (H) 09/23/2019 1248   CREATININE 1.48 (H) 05/29/2014 0949   CALCIUM 9.0 09/23/2019 1248   CALCIUM 9.1 05/01/2014 1322   PROT 7.7 09/23/2019 1248   PROT 7.5 05/01/2014 1322   ALBUMIN 3.7 09/23/2019 1248   ALBUMIN 3.6 05/01/2014 1322   AST 18 09/23/2019 1248   AST 17 05/01/2014 1322   ALT 16 09/23/2019 1248   ALT 13 (L) 05/01/2014 1322   ALKPHOS 51 09/23/2019 1248   ALKPHOS 47 05/01/2014 1322   BILITOT 0.6 09/23/2019 1248   BILITOT 0.6 05/01/2014 1322   GFRNONAA 44 (L) 09/23/2019 1248   GFRNONAA 47 (L) 05/29/2014 0949   GFRAA 51 (L) 09/23/2019 1248   GFRAA 55 (L) 05/29/2014 0949    No results found for: SPEP, UPEP  Lab Results  Component Value Date   WBC 3.7 (L) 09/23/2019   NEUTROABS 2.1 09/23/2019   HGB 13.0 09/23/2019   HCT 39.2 09/23/2019   MCV 94.7 09/23/2019   PLT 243 09/23/2019      Chemistry      Component Value Date/Time   NA 138 09/23/2019 1248   NA 139 05/01/2014 1322   K 5.4 (H) 09/23/2019 1248   K 4.4 05/01/2014 1322   CL 108 09/23/2019 1248   CL 107 05/01/2014 1322   CO2 26  09/23/2019 1248   CO2 27 05/01/2014 1322   BUN 20 09/23/2019 1248   BUN 16 05/01/2014 1322   CREATININE 1.53 (H) 09/23/2019 1248   CREATININE 1.48 (H) 05/29/2014 0949      Component Value Date/Time   CALCIUM 9.0 09/23/2019 1248   CALCIUM 9.1 05/01/2014 1322   ALKPHOS 51 09/23/2019 1248   ALKPHOS 47 05/01/2014 1322   AST 18 09/23/2019 1248   AST 17 05/01/2014 1322   ALT 16 09/23/2019 1248   ALT 13 (L) 05/01/2014 1322   BILITOT 0.6 09/23/2019 1248   BILITOT 0.6 05/01/2014 1322       RADIOGRAPHIC STUDIES: I have personally reviewed the radiological images as listed and agreed with the findings in the report. No results found.   ASSESSMENT & PLAN:  Multiple myeloma in remission ([place]) #Multiple myeloma-most recently on maintenance Revlimid 15 mg 2  weeks on 2 week off.  JULY 2021 M-protine-NEG; kappa lambda light chain ratio=1.74;-clinically STABLE; await MM labs from today.  # RE-START revlimid 15 mg 2 weeks; 2 weeks OFF [held sec to ANC-0.6]; Today ANC-2.1.   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 STABLE.   #Bone modifying agent/multiple myeloma-- on zometa 43m every other month; STABLE; but ca-9.0--proceed with Zometa today.on ca+vit D BID.  #Patient is immunocompromised because of multiple myeloma /on get Revlimid.  Discussed re: Booster covid vaccination; pt is interested.  Information given.  Disposition: # proceed with Zometa today # labs- cbc/cmp- # Follow up in 8 weeks-  MD;  labs (CBC, CMP, kappa lambda light chains, multiple myeloma panel),  and possible Zometa. Dr.B   Orders Placed This Encounter  Procedures  . CBC with Differential    Standing Status:   Future  Standing Expiration Date:   09/22/2020  . Comprehensive metabolic panel    Standing Status:   Future    Standing Expiration Date:   09/22/2020  . CBC with Differential    Standing Status:   Future    Standing Expiration Date:   09/22/2020  . Comprehensive metabolic panel     Standing Status:   Future    Standing Expiration Date:   09/22/2020  . Multiple Myeloma Panel (SPEP&IFE w/QIG)    Standing Status:   Future    Standing Expiration Date:   09/22/2020  . Kappa/lambda light chains    Standing Status:   Future    Standing Expiration Date:   09/22/2020   All questions were answered. The patient knows to call the clinic with any problems, questions or concerns.      Alan Sickle, MD 09/23/2019 2:11 PM

## 2019-09-23 NOTE — Patient Instructions (Addendum)
#   recommend covid booster vaccine-locations on [place] website.   [email or web address]  visiting [email or web address] or calling [phone]

## 2019-09-24 LAB — KAPPA/LAMBDA LIGHT CHAINS
Kappa free light chain: 48.3 mg/L — ABNORMAL HIGH (ref 3.3–19.4)
Kappa, lambda light chain ratio: 1.86 — ABNORMAL HIGH (ref 0.26–1.65)
Lambda free light chains: 25.9 mg/L (ref 5.7–26.3)

## 2019-09-26 LAB — MULTIPLE MYELOMA PANEL, SERUM
Albumin SerPl Elph-Mcnc: 3.8 g/dL (ref 2.9–4.4)
Albumin/Glob SerPl: 1.2 (ref 0.7–1.7)
Alpha 1: 0.2 g/dL (ref 0.0–0.4)
Alpha2 Glob SerPl Elph-Mcnc: 0.6 g/dL (ref 0.4–1.0)
B-Globulin SerPl Elph-Mcnc: 1.1 g/dL (ref 0.7–1.3)
Gamma Glob SerPl Elph-Mcnc: 1.4 g/dL (ref 0.4–1.8)
Globulin, Total: 3.3 g/dL (ref 2.2–3.9)
IgA: 626 mg/dL — ABNORMAL HIGH (ref 61–437)
IgG (Immunoglobin G), Serum: 1463 mg/dL (ref 603–1613)
IgM (Immunoglobulin M), Srm: 30 mg/dL (ref 15–143)
Total Protein ELP: 7.1 g/dL (ref 6.0–8.5)

## 2019-09-29 ENCOUNTER — Ambulatory Visit: Payer: Medicare HMO | Attending: Internal Medicine

## 2019-09-29 DIAGNOSIS — Z23 Encounter for immunization: Secondary | ICD-10-CM

## 2019-09-29 NOTE — Progress Notes (Signed)
   Covid-19 Vaccination Clinic  Name:  Alan Alexander    MRN: [id number] DOB: 12/11/1945  09/29/2019  Alan Alexander was observed post Covid-19 immunization for 15 minutes without incident. He was provided with Vaccine Information Sheet and instruction to access the V-Safe system.   Alan Alexander was instructed to call 911 with any severe reactions post vaccine: [name] Difficulty breathing  . Swelling of face and throat  . A fast heartbeat  . A bad rash all over body  . Dizziness and weakness

## 2019-10-16 ENCOUNTER — Telehealth: Payer: Self-pay | Admitting: *Deleted

## 2019-10-16 DIAGNOSIS — C9001 Multiple myeloma in remission: Secondary | ICD-10-CM

## 2019-10-16 MED ORDER — LENALIDOMIDE 15 MG PO CAPS: ORAL_CAPSULE | ORAL | 0 refills | Status: DC

## 2019-10-16 NOTE — Telephone Encounter (Signed)
Rx sent to pharmacy. Tried calling pt to remind him to take REMS survey as well. No answer and VM was full. Will try back.

## 2019-10-16 NOTE — Telephone Encounter (Signed)
Patient returned call. Writer spoke with him and reminded him to take survey when called. He verbalized understanding.

## 2019-10-16 NOTE — Telephone Encounter (Signed)
Pharmacy requests prescription for Revlimid.

## 2019-10-21 ENCOUNTER — Inpatient Hospital Stay: Payer: Medicare HMO | Attending: Internal Medicine

## 2019-10-21 ENCOUNTER — Other Ambulatory Visit: Payer: Self-pay

## 2019-10-21 DIAGNOSIS — C9001 Multiple myeloma in remission: Secondary | ICD-10-CM | POA: Insufficient documentation

## 2019-10-21 LAB — COMPREHENSIVE METABOLIC PANEL
ALT: 14 U/L (ref 0–44)
AST: 15 U/L (ref 15–41)
Albumin: 3.4 g/dL — ABNORMAL LOW (ref 3.5–5.0)
Alkaline Phosphatase: 45 U/L (ref 38–126)
Anion gap: 6 (ref 5–15)
BUN: 19 mg/dL (ref 8–23)
CO2: 26 mmol/L (ref 22–32)
Calcium: 8.6 mg/dL — ABNORMAL LOW (ref 8.9–10.3)
Chloride: 106 mmol/L (ref 98–111)
Creatinine, Ser: 1.62 mg/dL — ABNORMAL HIGH (ref 0.61–1.24)
GFR calc Af Amer: 47 mL/min — ABNORMAL LOW (ref >60)
GFR calc non Af Amer: 41 mL/min — ABNORMAL LOW (ref >60)
Glucose, Bld: 153 mg/dL — ABNORMAL HIGH (ref 70–99)
Potassium: 4.4 mmol/L (ref 3.5–5.1)
Sodium: 138 mmol/L (ref 135–145)
Total Bilirubin: 0.6 mg/dL (ref 0.3–1.2)
Total Protein: 7.2 g/dL (ref 6.5–8.1)

## 2019-10-21 LAB — CBC WITH DIFFERENTIAL/PLATELET
Abs Immature Granulocytes: 0.02 10*3/uL (ref 0.00–0.07)
Basophils Absolute: 0 10*3/uL (ref 0.0–0.1)
Basophils Relative: 1 %
Eosinophils Absolute: 0.2 10*3/uL (ref 0.0–0.5)
Eosinophils Relative: 8 %
HCT: 35.9 % — ABNORMAL LOW (ref 39.0–52.0)
Hemoglobin: 12.1 g/dL — ABNORMAL LOW (ref 13.0–17.0)
Immature Granulocytes: 1 %
Lymphocytes Relative: 22 %
Lymphs Abs: 0.7 10*3/uL (ref 0.7–4.0)
MCH: 31.6 pg (ref 26.0–34.0)
MCHC: 33.7 g/dL (ref 30.0–36.0)
MCV: 93.7 fL (ref 80.0–100.0)
Monocytes Absolute: 0.4 10*3/uL (ref 0.1–1.0)
Monocytes Relative: 14 %
Neutro Abs: 1.7 10*3/uL (ref 1.7–7.7)
Neutrophils Relative %: 54 %
Platelets: 216 10*3/uL (ref 150–400)
RBC: 3.83 MIL/uL — ABNORMAL LOW (ref 4.22–5.81)
RDW: 14.4 % (ref 11.5–15.5)
WBC: 3.1 10*3/uL — ABNORMAL LOW (ref 4.0–10.5)
nRBC: 0 % (ref 0.0–0.2)

## 2019-11-07 ENCOUNTER — Other Ambulatory Visit: Payer: Self-pay

## 2019-11-07 DIAGNOSIS — C9001 Multiple myeloma in remission: Secondary | ICD-10-CM

## 2019-11-07 MED ORDER — LENALIDOMIDE 15 MG PO CAPS: ORAL_CAPSULE | ORAL | 0 refills | Status: DC

## 2019-11-18 ENCOUNTER — Ambulatory Visit: Payer: Medicare HMO

## 2019-11-18 ENCOUNTER — Encounter: Payer: Self-pay | Admitting: Internal Medicine

## 2019-11-18 ENCOUNTER — Inpatient Hospital Stay (HOSPITAL_BASED_OUTPATIENT_CLINIC_OR_DEPARTMENT_OTHER): Payer: Medicare HMO | Admitting: Internal Medicine

## 2019-11-18 ENCOUNTER — Other Ambulatory Visit: Payer: Self-pay

## 2019-11-18 ENCOUNTER — Inpatient Hospital Stay: Payer: Medicare HMO | Attending: Internal Medicine

## 2019-11-18 DIAGNOSIS — Z7984 Long term (current) use of oral hypoglycemic drugs: Secondary | ICD-10-CM | POA: Diagnosis not present

## 2019-11-18 DIAGNOSIS — Z7982 Long term (current) use of aspirin: Secondary | ICD-10-CM | POA: Insufficient documentation

## 2019-11-18 DIAGNOSIS — I129 Hypertensive chronic kidney disease with stage 1 through stage 4 chronic kidney disease, or unspecified chronic kidney disease: Secondary | ICD-10-CM | POA: Insufficient documentation

## 2019-11-18 DIAGNOSIS — K219 Gastro-esophageal reflux disease without esophagitis: Secondary | ICD-10-CM | POA: Insufficient documentation

## 2019-11-18 DIAGNOSIS — E785 Hyperlipidemia, unspecified: Secondary | ICD-10-CM | POA: Insufficient documentation

## 2019-11-18 DIAGNOSIS — N183 Chronic kidney disease, stage 3 unspecified: Secondary | ICD-10-CM | POA: Diagnosis not present

## 2019-11-18 DIAGNOSIS — Z79899 Other long term (current) drug therapy: Secondary | ICD-10-CM | POA: Diagnosis not present

## 2019-11-18 DIAGNOSIS — C9001 Multiple myeloma in remission: Secondary | ICD-10-CM | POA: Insufficient documentation

## 2019-11-18 DIAGNOSIS — E1122 Type 2 diabetes mellitus with diabetic chronic kidney disease: Secondary | ICD-10-CM | POA: Diagnosis not present

## 2019-11-18 LAB — COMPREHENSIVE METABOLIC PANEL
ALT: 15 U/L (ref 0–44)
AST: 16 U/L (ref 15–41)
Albumin: 3.8 g/dL (ref 3.5–5.0)
Alkaline Phosphatase: 52 U/L (ref 38–126)
Anion gap: 5 (ref 5–15)
BUN: 27 mg/dL — ABNORMAL HIGH (ref 8–23)
CO2: 26 mmol/L (ref 22–32)
Calcium: 9.2 mg/dL (ref 8.9–10.3)
Chloride: 108 mmol/L (ref 98–111)
Creatinine, Ser: 1.53 mg/dL — ABNORMAL HIGH (ref 0.61–1.24)
GFR, Estimated: 44 mL/min — ABNORMAL LOW (ref >60)
Glucose, Bld: 118 mg/dL — ABNORMAL HIGH (ref 70–99)
Potassium: 5.1 mmol/L (ref 3.5–5.1)
Sodium: 139 mmol/L (ref 135–145)
Total Bilirubin: 0.6 mg/dL (ref 0.3–1.2)
Total Protein: 7.7 g/dL (ref 6.5–8.1)

## 2019-11-18 LAB — CBC WITH DIFFERENTIAL/PLATELET
Abs Immature Granulocytes: 0.01 10*3/uL (ref 0.00–0.07)
Basophils Absolute: 0.1 10*3/uL (ref 0.0–0.1)
Basophils Relative: 1 %
Eosinophils Absolute: 0.3 10*3/uL (ref 0.0–0.5)
Eosinophils Relative: 8 %
HCT: 40.3 % (ref 39.0–52.0)
Hemoglobin: 13.4 g/dL (ref 13.0–17.0)
Immature Granulocytes: 0 %
Lymphocytes Relative: 21 %
Lymphs Abs: 0.8 10*3/uL (ref 0.7–4.0)
MCH: 31.4 pg (ref 26.0–34.0)
MCHC: 33.3 g/dL (ref 30.0–36.0)
MCV: 94.4 fL (ref 80.0–100.0)
Monocytes Absolute: 0.6 10*3/uL (ref 0.1–1.0)
Monocytes Relative: 15 %
Neutro Abs: 2.1 10*3/uL (ref 1.7–7.7)
Neutrophils Relative %: 55 %
Platelets: 221 10*3/uL (ref 150–400)
RBC: 4.27 MIL/uL (ref 4.22–5.81)
RDW: 14.3 % (ref 11.5–15.5)
WBC: 3.9 10*3/uL — ABNORMAL LOW (ref 4.0–10.5)
nRBC: 0 % (ref 0.0–0.2)

## 2019-11-18 NOTE — Assessment & Plan Note (Addendum)
#  Multiple myeloma-most recently on maintenance Revlimid 15 mg 2  weeks on 2 week off.  JULY 2021 M-protine-NEG; kappa lambda light chain ratio=1.74;-clinically STABLE; await MM labs from today.  # On Revlimid 15 mg 2 weeks; 2 weeks OFF [ sec to ANC-0.6]; Today ANC-2.1.   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 STABLE  #Bone modifying agent/multiple myeloma-- on zometa $RemoveB'3mg'hnMDPLBI$  every other month; STABLE; on ca+vit D  Disposition: # 1 month- cbc/cmp # Follow up in 8 weeks-  MD;  labs (CBC, CMP, kappa lambda light chains, multiple myeloma panel),  and possible Zometa. Dr.B

## 2019-11-18 NOTE — Progress Notes (Signed)
[place] OFFICE PROGRESS NOTE  Patient Care Team: Baxter Hire, MD as PCP - General (Internal Medicine) Druscilla Brownie, MD as Consulting Physician (Dermatology) Cammie Sickle, MD as Consulting Physician (Hematology and Oncology)  Cancer Staging No matching staging information was found for the patient.   Oncology History Overview Note   # April 2014- MULTIPLE MYELOMA [IgG- 2.2gm/dl; 40-50% plasma cell; Cyto-N; FISH- gain of chr.9, 15 & ccnd1/11q13] s/p RVD x6 [sep 2014; BMBx- ~5% plasma cells]; Maint Rev-DEX-Zometa; March 2015-Stopped Dex Cont- Rev-Zometa; July 2016- Rev 22m; NOV 4th  2016-HOLD[facility] declined BMT.   # Re-START FEB 2017 Rev 142m3 W- on & 1 w OFF; HELD Rev June- Oct 2020- sec to worsening renal function [Dr.Singh]; SEP 2021- Rev 15 mg 2 w-On & 2 w-OFF.   # Zometa  # CKD [creat ~1.4; sec MM; Dr.Singh]; DM  -------------------------------------------------------------------------   DIAGNOSIS: [2[name] MULTIPLE MYELOMA  GOALS: control  CURRENT/MOST RECENT THERAPY- REVLIMID Maintenance [Feb 2017]    Multiple [facility]oma in remission ([facility] 10/01/2015 Initial Diagnosis   Multiple myeloma in remission ([facility]    INTERVAL HISTORY:  ThCaryl Manas89.o.  Alexander pleasant patient above history of multiple myeloma most recently on Revlimid maintenance is here for follow-up.  Patient denies any blood in stools or black-colored stools but denies any nausea vomiting.  Appetite is good.  No weight loss.  No fevers or chills.  No skin rash.  Review of Systems  Constitutional: Negative for chills, diaphoresis, fever, malaise/fatigue and weight loss.  HENT: Negative for nosebleeds and sore throat.   Eyes: Negative for double vision.  Respiratory: Negative for cough, hemoptysis, sputum production, shortness of breath and wheezing.   Cardiovascular: Positive for leg swelling. Negative for chest pain, palpitations and  orthopnea.  Gastrointestinal: Negative for abdominal pain, blood in stool, constipation, diarrhea, heartburn, melena, nausea and vomiting.  Genitourinary: Negative for dysuria, frequency and urgency.  Musculoskeletal: Negative for back pain and joint pain.  Skin: Negative.  Negative for itching and rash.  Neurological: Negative for dizziness, tingling, focal weakness, weakness and headaches.  Endo/Heme/Allergies: Does not bruise/bleed easily.  Psychiatric/Behavioral: Negative for depression. The patient is not nervous/anxious and does not have insomnia.       PAST MEDICAL HISTORY :  Past Medical History:  Diagnosis Date  . Diabetes mellitus without complication ([facility]  . GERD (gastroesophageal reflux disease)   . Hyperlipidemia   . Hypertension   . Multiple myeloma ([facility]  . Obesity   . Thyroid cancer ([facility]  . Thyroid disease     PAST SURGICAL HISTORY :   Past Surgical History:  Procedure Laterality Date  . BONE MARROW BIOPSY  2014  . CATARACT EXTRACTION BILATERAL W/ ANTERIOR VITRECTOMY      FAMILY HISTORY :   Family History  Problem Relation Age of Onset  . Cancer Father 6317. Diabetes Mother     SOCIAL HISTORY:   Social History   Tobacco Use  . Smoking status: Never Smoker  . Smokeless tobacco: Never Used  Substance Use Topics  . Alcohol use: No  . Drug use: No    ALLERGIES:  has No Known Allergies.  MEDICATIONS:  Current Outpatient Medications  Medication Sig Dispense Refill  . amLODipine (NORVASC) 10 MG tablet Take 10 mg by mouth daily.    . Marland Kitchenspirin EC 81 MG tablet Take 81 mg by mouth daily.    . Marland Kitchentorvastatin (  LIPITOR) 20 MG tablet Take 20 mg by mouth daily.    . cloNIDine (CATAPRES) 0.1 MG tablet Take 0.1 mg by mouth 2 (two) times daily.    . Cyanocobalamin (RA VITAMIN B-12 TR) 1000 MCG TBCR Take by mouth.    . famotidine (PEPCID) 20 MG tablet Take 1 tablet (20 mg total) by mouth 2 (two) times daily. 14 tablet 0  . lenalidomide (REVLIMID) 15 MG  capsule Take 1 capsule (15 mg total) by mouth daily for 2 weeks, then hold for 2 weeks off 14 capsule 0  . levothyroxine (SYNTHROID) 137 MCG tablet Take 1 tablet by mouth daily.     [name] DELICA LANCETS 85I MISC Inject 1 Lancet as directed once daily.    . pantoprazole (PROTONIX) 40 MG tablet TAKE 1 TABLET BY MOUTH EVERY DAY 60 tablet 3  . pioglitazone (ACTOS) 30 MG tablet Take 30 mg by mouth daily.    . potassium chloride SA (K-DUR,KLOR-CON) 20 MEQ tablet Take 20 mEq by mouth 2 (two) times daily.   0  . quinapril (ACCUPRIL) 40 MG tablet Take 40 mg by mouth daily.    [name] torsemide (DEMADEX) 20 MG tablet Take 20 mg by mouth daily as needed.     No current facility-administered medications for this visit.   Facility-Administered Medications Ordered in Other Visits  Medication Dose Route Frequency Provider Last Rate Last Admin  . 0.9 %  sodium chloride infusion   Intravenous Continuous Cammie Sickle, MD   Stopped at 12/11/14 1528    PHYSICAL EXAMINATION: ECOG PERFORMANCE STATUS: 0 - Asymptomatic  BP 127/66 (BP Location: Left Arm, Patient Position: Sitting, Cuff Size: Large)   Pulse (!) 59   Temp (!) 96.6 F (35.9 C) (Tympanic)   Resp 18   Ht '5\' 10"'  (1.778 m)   Wt 246 lb (111.6 kg)   SpO2 100%   BMI 35.30 kg/m   Filed Weights   11/18/19 1300  Weight: 246 lb (111.6 kg)    Physical Exam HENT:     Head: Normocephalic and atraumatic.     Mouth/Throat:     Pharynx: No oropharyngeal exudate.  Eyes:     Pupils: Pupils are equal, round, and reactive to light.  Cardiovascular:     Rate and Rhythm: Normal rate and regular rhythm.  Pulmonary:     Effort: No respiratory distress.     Breath sounds: No wheezing.  Abdominal:     General: Bowel sounds are normal. There is no distension.     Palpations: Abdomen is soft. There is no mass.     Tenderness: There is no abdominal tenderness. There is no guarding or rebound.  Musculoskeletal:        General: No tenderness. Normal  range of motion.     Cervical back: Normal range of motion and neck supple.  Skin:    General: Skin is warm.  Neurological:     Mental Status: He is alert and oriented to person, place, and time.  Psychiatric:        Mood and Affect: Affect normal.       LABORATORY DATA:  I have reviewed the data as listed    Component Value Date/Time   NA 139 11/18/2019 1250   NA 139 05/01/2014 1322   K 5.1 11/18/2019 1250   K 4.4 05/01/2014 1322   CL 108 11/18/2019 1250   CL 107 05/01/2014 1322   CO2 26 11/18/2019 1250   CO2 27 05/01/2014 1322  GLUCOSE 118 (H) 11/18/2019 1250   GLUCOSE 102 (H) 05/01/2014 1322   BUN 27 (H) 11/18/2019 1250   BUN 16 05/01/2014 1322   CREATININE 1.53 (H) 11/18/2019 1250   CREATININE 1.48 (H) 05/29/2014 0949   CALCIUM 9.2 11/18/2019 1250   CALCIUM 9.1 05/01/2014 1322   PROT 7.7 11/18/2019 1250   PROT 7.5 05/01/2014 1322   ALBUMIN 3.8 11/18/2019 1250   ALBUMIN 3.6 05/01/2014 1322   AST 16 11/18/2019 1250   AST 17 05/01/2014 1322   ALT 15 11/18/2019 1250   ALT 13 (L) 05/01/2014 1322   ALKPHOS 52 11/18/2019 1250   ALKPHOS 47 05/01/2014 1322   BILITOT 0.6 11/18/2019 1250   BILITOT 0.6 05/01/2014 1322   GFRNONAA 44 (L) 11/18/2019 1250   GFRNONAA 47 (L) 05/29/2014 0949   GFRAA 47 (L) 10/21/2019 1106   GFRAA 55 (L) 05/29/2014 0949    No results found for: SPEP, UPEP  Lab Results  Component Value Date   WBC 3.9 (L) 11/18/2019   NEUTROABS 2.1 11/18/2019   HGB 13.4 11/18/2019   HCT 40.3 11/18/2019   MCV 94.4 11/18/2019   PLT 221 11/18/2019      Chemistry      Component Value Date/Time   NA 139 11/18/2019 1250   NA 139 05/01/2014 1322   K 5.1 11/18/2019 1250   K 4.4 05/01/2014 1322   CL 108 11/18/2019 1250   CL 107 05/01/2014 1322   CO2 26 11/18/2019 1250   CO2 27 05/01/2014 1322   BUN 27 (H) 11/18/2019 1250   BUN 16 05/01/2014 1322   CREATININE 1.53 (H) 11/18/2019 1250   CREATININE 1.48 (H) 05/29/2014 0949      Component Value  Date/Time   CALCIUM 9.2 11/18/2019 1250   CALCIUM 9.1 05/01/2014 1322   ALKPHOS 52 11/18/2019 1250   ALKPHOS 47 05/01/2014 1322   AST 16 11/18/2019 1250   AST 17 05/01/2014 1322   ALT 15 11/18/2019 1250   ALT 13 (L) 05/01/2014 1322   BILITOT 0.6 11/18/2019 1250   BILITOT 0.6 05/01/2014 1322       RADIOGRAPHIC STUDIES: I have personally reviewed the radiological images as listed and agreed with the findings in the report. No results found.   ASSESSMENT & PLAN:  Multiple myeloma in remission ([place]) #Multiple myeloma-most recently on maintenance Revlimid 15 mg 2  weeks on 2 week off.  JULY 2021 M-protine-NEG; kappa lambda light chain ratio=1.74;-clinically STABLE; await MM labs from today.  # On Revlimid 15 mg 2 weeks; 2 weeks OFF [ sec to ANC-0.6]; Today ANC-2.1.   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 STABLE  #Bone modifying agent/multiple myeloma-- on zometa 33m every other month; STABLE; on ca+vit D  Disposition: # 1 month- cbc/cmp # Follow up in 8 weeks-  MD;  labs (CBC, CMP, kappa lambda light chains, multiple myeloma panel),  and possible Zometa. Dr.B   Orders Placed This Encounter  Procedures  . CBC with Differential    Standing Status:   Future    Standing Expiration Date:   11/17/2020  . Comprehensive metabolic panel    Standing Status:   Future    Standing Expiration Date:   11/17/2020  . Multiple Myeloma Panel (SPEP&IFE w/QIG)    Standing Status:   Future    Standing Expiration Date:   11/17/2020  . Kappa/lambda light chains    Standing Status:   Future    Standing Expiration Date:   11/17/2020  All questions were answered. The patient knows to call the clinic with any problems, questions or concerns.      Cammie Sickle, MD 11/18/2019 1:32 PM

## 2019-11-19 LAB — KAPPA/LAMBDA LIGHT CHAINS
Kappa free light chain: 64.7 mg/L — ABNORMAL HIGH (ref 3.3–19.4)
Kappa, lambda light chain ratio: 1.9 — ABNORMAL HIGH (ref 0.26–1.65)
Lambda free light chains: 34.1 mg/L — ABNORMAL HIGH (ref 5.7–26.3)

## 2019-11-20 LAB — MULTIPLE MYELOMA PANEL, SERUM
Albumin SerPl Elph-Mcnc: 3.4 g/dL (ref 2.9–4.4)
Albumin/Glob SerPl: 1 (ref 0.7–1.7)
Alpha 1: 0.2 g/dL (ref 0.0–0.4)
Alpha2 Glob SerPl Elph-Mcnc: 0.7 g/dL (ref 0.4–1.0)
B-Globulin SerPl Elph-Mcnc: 1.2 g/dL (ref 0.7–1.3)
Gamma Glob SerPl Elph-Mcnc: 1.5 g/dL (ref 0.4–1.8)
Globulin, Total: 3.7 g/dL (ref 2.2–3.9)
IgA: 624 mg/dL — ABNORMAL HIGH (ref 61–437)
IgG (Immunoglobin G), Serum: 1515 mg/dL (ref 603–1613)
IgM (Immunoglobulin M), Srm: 32 mg/dL (ref 15–143)
Total Protein ELP: 7.1 g/dL (ref 6.0–8.5)

## 2019-12-05 ENCOUNTER — Other Ambulatory Visit: Payer: Self-pay | Admitting: *Deleted

## 2019-12-05 DIAGNOSIS — C9001 Multiple myeloma in remission: Secondary | ICD-10-CM

## 2019-12-05 MED ORDER — LENALIDOMIDE 15 MG PO CAPS: ORAL_CAPSULE | ORAL | 0 refills | Status: DC

## 2019-12-16 ENCOUNTER — Inpatient Hospital Stay: Payer: Medicare HMO | Attending: Internal Medicine

## 2019-12-16 DIAGNOSIS — C9001 Multiple myeloma in remission: Secondary | ICD-10-CM | POA: Insufficient documentation

## 2019-12-16 LAB — COMPREHENSIVE METABOLIC PANEL
ALT: 16 U/L (ref 0–44)
AST: 16 U/L (ref 15–41)
Albumin: 3.5 g/dL (ref 3.5–5.0)
Alkaline Phosphatase: 41 U/L (ref 38–126)
Anion gap: 6 (ref 5–15)
BUN: 21 mg/dL (ref 8–23)
CO2: 27 mmol/L (ref 22–32)
Calcium: 9.3 mg/dL (ref 8.9–10.3)
Chloride: 107 mmol/L (ref 98–111)
Creatinine, Ser: 1.66 mg/dL — ABNORMAL HIGH (ref 0.61–1.24)
GFR, Estimated: 43 mL/min — ABNORMAL LOW (ref >60)
Glucose, Bld: 124 mg/dL — ABNORMAL HIGH (ref 70–99)
Potassium: 5.3 mmol/L — ABNORMAL HIGH (ref 3.5–5.1)
Sodium: 140 mmol/L (ref 135–145)
Total Bilirubin: 0.7 mg/dL (ref 0.3–1.2)
Total Protein: 7.2 g/dL (ref 6.5–8.1)

## 2019-12-16 LAB — CBC WITH DIFFERENTIAL/PLATELET
Abs Immature Granulocytes: 0 10*3/uL (ref 0.00–0.07)
Basophils Absolute: 0 10*3/uL (ref 0.0–0.1)
Basophils Relative: 1 %
Eosinophils Absolute: 0.2 10*3/uL (ref 0.0–0.5)
Eosinophils Relative: 7 %
HCT: 37.5 % — ABNORMAL LOW (ref 39.0–52.0)
Hemoglobin: 12.2 g/dL — ABNORMAL LOW (ref 13.0–17.0)
Immature Granulocytes: 0 %
Lymphocytes Relative: 19 %
Lymphs Abs: 0.5 10*3/uL — ABNORMAL LOW (ref 0.7–4.0)
MCH: 31 pg (ref 26.0–34.0)
MCHC: 32.5 g/dL (ref 30.0–36.0)
MCV: 95.2 fL (ref 80.0–100.0)
Monocytes Absolute: 0.4 10*3/uL (ref 0.1–1.0)
Monocytes Relative: 12 %
Neutro Abs: 1.7 10*3/uL (ref 1.7–7.7)
Neutrophils Relative %: 61 %
Platelets: 212 10*3/uL (ref 150–400)
RBC: 3.94 MIL/uL — ABNORMAL LOW (ref 4.22–5.81)
RDW: 15.3 % (ref 11.5–15.5)
WBC: 2.8 10*3/uL — ABNORMAL LOW (ref 4.0–10.5)
nRBC: 0 % (ref 0.0–0.2)

## 2020-01-05 ENCOUNTER — Other Ambulatory Visit: Payer: Self-pay | Admitting: *Deleted

## 2020-01-05 DIAGNOSIS — C9001 Multiple myeloma in remission: Secondary | ICD-10-CM

## 2020-01-05 MED ORDER — LENALIDOMIDE 15 MG PO CAPS: ORAL_CAPSULE | ORAL | 0 refills | Status: DC

## 2020-01-13 ENCOUNTER — Inpatient Hospital Stay: Payer: Medicare HMO

## 2020-01-13 ENCOUNTER — Inpatient Hospital Stay (HOSPITAL_BASED_OUTPATIENT_CLINIC_OR_DEPARTMENT_OTHER): Payer: Medicare HMO | Admitting: Internal Medicine

## 2020-01-13 ENCOUNTER — Inpatient Hospital Stay: Payer: Medicare HMO | Attending: Internal Medicine

## 2020-01-13 DIAGNOSIS — K219 Gastro-esophageal reflux disease without esophagitis: Secondary | ICD-10-CM | POA: Insufficient documentation

## 2020-01-13 DIAGNOSIS — Z79899 Other long term (current) drug therapy: Secondary | ICD-10-CM | POA: Diagnosis not present

## 2020-01-13 DIAGNOSIS — Z85850 Personal history of malignant neoplasm of thyroid: Secondary | ICD-10-CM | POA: Diagnosis not present

## 2020-01-13 DIAGNOSIS — N183 Chronic kidney disease, stage 3 unspecified: Secondary | ICD-10-CM | POA: Insufficient documentation

## 2020-01-13 DIAGNOSIS — E1122 Type 2 diabetes mellitus with diabetic chronic kidney disease: Secondary | ICD-10-CM | POA: Insufficient documentation

## 2020-01-13 DIAGNOSIS — C9001 Multiple myeloma in remission: Secondary | ICD-10-CM

## 2020-01-13 DIAGNOSIS — E785 Hyperlipidemia, unspecified: Secondary | ICD-10-CM | POA: Insufficient documentation

## 2020-01-13 DIAGNOSIS — Z7984 Long term (current) use of oral hypoglycemic drugs: Secondary | ICD-10-CM | POA: Diagnosis not present

## 2020-01-13 DIAGNOSIS — Z7982 Long term (current) use of aspirin: Secondary | ICD-10-CM | POA: Diagnosis not present

## 2020-01-13 DIAGNOSIS — I129 Hypertensive chronic kidney disease with stage 1 through stage 4 chronic kidney disease, or unspecified chronic kidney disease: Secondary | ICD-10-CM | POA: Insufficient documentation

## 2020-01-13 LAB — COMPREHENSIVE METABOLIC PANEL
ALT: 18 U/L (ref 0–44)
AST: 19 U/L (ref 15–41)
Albumin: 3.5 g/dL (ref 3.5–5.0)
Alkaline Phosphatase: 45 U/L (ref 38–126)
Anion gap: 7 (ref 5–15)
BUN: 20 mg/dL (ref 8–23)
CO2: 25 mmol/L (ref 22–32)
Calcium: 9.3 mg/dL (ref 8.9–10.3)
Chloride: 105 mmol/L (ref 98–111)
Creatinine, Ser: 1.73 mg/dL — ABNORMAL HIGH (ref 0.61–1.24)
GFR, Estimated: 41 mL/min — ABNORMAL LOW (ref >60)
Glucose, Bld: 102 mg/dL — ABNORMAL HIGH (ref 70–99)
Potassium: 4.9 mmol/L (ref 3.5–5.1)
Sodium: 137 mmol/L (ref 135–145)
Total Bilirubin: 0.6 mg/dL (ref 0.3–1.2)
Total Protein: 7.9 g/dL (ref 6.5–8.1)

## 2020-01-13 LAB — CBC WITH DIFFERENTIAL/PLATELET
Abs Immature Granulocytes: 0.02 10*3/uL (ref 0.00–0.07)
Basophils Absolute: 0.1 10*3/uL (ref 0.0–0.1)
Basophils Relative: 2 %
Eosinophils Absolute: 0.2 10*3/uL (ref 0.0–0.5)
Eosinophils Relative: 4 %
HCT: 39.6 % (ref 39.0–52.0)
Hemoglobin: 12.9 g/dL — ABNORMAL LOW (ref 13.0–17.0)
Immature Granulocytes: 1 %
Lymphocytes Relative: 26 %
Lymphs Abs: 1.1 10*3/uL (ref 0.7–4.0)
MCH: 31.2 pg (ref 26.0–34.0)
MCHC: 32.6 g/dL (ref 30.0–36.0)
MCV: 95.9 fL (ref 80.0–100.0)
Monocytes Absolute: 0.4 10*3/uL (ref 0.1–1.0)
Monocytes Relative: 11 %
Neutro Abs: 2.4 10*3/uL (ref 1.7–7.7)
Neutrophils Relative %: 56 %
Platelets: 449 10*3/uL — ABNORMAL HIGH (ref 150–400)
RBC: 4.13 MIL/uL — ABNORMAL LOW (ref 4.22–5.81)
RDW: 15.4 % (ref 11.5–15.5)
WBC: 4.1 10*3/uL (ref 4.0–10.5)
nRBC: 0 % (ref 0.0–0.2)

## 2020-01-13 MED ORDER — ZOLEDRONIC ACID 4 MG/5ML IV CONC
3.0000 mg | Freq: Once | INTRAVENOUS | Status: AC
  Administered 2020-01-13: 3 mg via INTRAVENOUS
  Filled 2020-01-13: qty 3.75

## 2020-01-13 MED ORDER — SODIUM CHLORIDE 0.9 % IV SOLN
Freq: Once | INTRAVENOUS | Status: AC
  Filled 2020-01-13: qty 250

## 2020-01-13 NOTE — Progress Notes (Signed)
Creatinine: 1.73. MD, Dr. Rogue Bussing, notified and aware. Per MD order: proceed with scheduled Zometa infusion today; Zometa has already been dose reduced.  1441- Patient tolerated Zometa infusion well. Patient stable and discharged to home at this time.

## 2020-01-13 NOTE — Progress Notes (Signed)
[facility] OFFICE PROGRESS NOTE  Patient Care Team: Baxter Hire, MD as PCP - General (Internal Medicine) Druscilla Brownie, MD as Consulting Physician (Dermatology) Cammie Sickle, MD as Consulting Physician (Hematology and Oncology)  Cancer Staging No matching staging information was found for the patient.   Oncology History Overview Note   # April 2014- MULTIPLE MYELOMA [IgG- 2.2gm/dl; 40-50% plasma cell; Cyto-N; FISH- gain of chr.9, 15 & ccnd1/11q13] s/p RVD x6 [sep 2014; BMBx- ~5% plasma cells]; Maint Rev-DEX-Zometa; March 2015-Stopped Dex Cont- Rev-Zometa; July 2016- Rev 61m; NOV 4th  2016-HOLD; [facility] declined BMT.   # Re-START FEB 2017 Rev 123m3 W- on & 1 w OFF; HELD Rev June- Oct 2020- sec to worsening renal function [Dr.Singh]; SEP 2021- Rev 15 mg 2 w-On & 2 w-OFF.   # Zometa  # CKD [creat ~1.4; sec MM; Dr.Singh]; DM  -------------------------------------------------------------------------   DIAGNOSIS: [2[name] MULTIPLE MYELOMA  GOALS: control  CURRENT/MOST RECENT THERAPY- REVLIMID Maintenance [Feb 2017]    [place] 10/01/2015 Initial Diagnosis   [place] in remission ([facility]    INTERVAL HISTORY:  Alan Alexander.o.  male pleasant patient above history of multiple myeloma  on Revlimid maintenance is here for follow-up.  Patient denies any skin rash or diarrhea.  Appetite is good.  No weight loss but no nausea vomiting but no vomiting.  No fevers or chills.   Review of Systems  Constitutional: Negative for chills, diaphoresis, fever, malaise/fatigue and weight loss.  HENT: Negative for nosebleeds and sore throat.   Eyes: Negative for double vision.  Respiratory: Negative for cough, hemoptysis, sputum production, shortness of breath and wheezing.   Cardiovascular: Positive for leg swelling. Negative for chest pain, palpitations and orthopnea.  Gastrointestinal: Negative for  abdominal pain, blood in stool, constipation, diarrhea, heartburn, melena, nausea and vomiting.  Genitourinary: Negative for dysuria, frequency and urgency.  Musculoskeletal: Negative for back pain and joint pain.  Skin: Negative.  Negative for itching and rash.  Neurological: Negative for dizziness, tingling, focal weakness, weakness and headaches.  Endo/Heme/Allergies: Does not bruise/bleed easily.  Psychiatric/Behavioral: Negative for depression. The patient is not nervous/anxious and does not have insomnia.       PAST MEDICAL HISTORY :  Past Medical History:  Diagnosis Date  . Diabetes mellitus without complication ([facility]  . GERD (gastroesophageal reflux disease)   . Hyperlipidemia   . Hypertension   . Multiple myeloma ([facility]  . Obesity   . Thyroid cancer ([facility]  . Thyroid disease     PAST SURGICAL HISTORY :   Past Surgical History:  Procedure Laterality Date  . BONE MARROW BIOPSY  2014  . CATARACT EXTRACTION BILATERAL W/ ANTERIOR VITRECTOMY      FAMILY HISTORY :   Family History  Problem Relation Age of Onset  . Cancer Father [id number]. Diabetes Mother     SOCIAL HISTORY:   Social History   Tobacco Use  . Smoking status: Never Smoker  . Smokeless tobacco: Never Used  Substance Use Topics  . Alcohol use: No  . Drug use: No    ALLERGIES:  has No Known Allergies.  MEDICATIONS:  Current Outpatient Medications  Medication Sig Dispense Refill  . amLODipine (NORVASC) 10 MG tablet Take 10 mg by mouth daily.    . Marland Kitchenspirin EC 81 MG tablet Take 81 mg by mouth daily.    . Marland Kitchentorvastatin (LIPITOR) 20 MG tablet  Take 20 mg by mouth daily.    . cloNIDine (CATAPRES) 0.1 MG tablet Take 0.1 mg by mouth 2 (two) times daily.    . Cyanocobalamin (RA VITAMIN B-12 TR) 1000 MCG TBCR Take by mouth.    . famotidine (PEPCID) 20 MG tablet Take 1 tablet (20 mg total) by mouth 2 (two) times daily. 14 tablet 0  . lenalidomide (REVLIMID) 15 MG capsule Take 1 capsule (15 mg total) by mouth  daily for 2 weeks, then hold for 2 weeks off 14 capsule 0  . levothyroxine (SYNTHROID) 137 MCG tablet Take 1 tablet by mouth daily.     [name] DELICA LANCETS 65Y MISC Inject 1 Lancet as directed once daily.    . pantoprazole (PROTONIX) 40 MG tablet TAKE 1 TABLET BY MOUTH EVERY DAY 60 tablet 3  . pioglitazone (ACTOS) 30 MG tablet Take 30 mg by mouth daily.    . potassium chloride SA (K-DUR,KLOR-CON) 20 MEQ tablet Take 20 mEq by mouth 2 (two) times daily.   0  . quinapril (ACCUPRIL) 40 MG tablet Take 40 mg by mouth daily.    [name] torsemide (DEMADEX) 20 MG tablet Take 20 mg by mouth daily as needed.     No current facility-administered medications for this visit.   Facility-Administered Medications Ordered in Other Visits  Medication Dose Route Frequency Provider Last Rate Last Admin  . 0.9 %  sodium chloride infusion   Intravenous Continuous Cammie Sickle, MD   Stopped at 12/11/14 1528    PHYSICAL EXAMINATION: ECOG PERFORMANCE STATUS: 0 - Asymptomatic  BP (!) 108/59 (BP Location: Left Arm, Patient Position: Sitting, Cuff Size: Large)   Pulse 62   Temp (!) 95.9 F (35.5 C) (Tympanic)   Resp 16   Ht '5\' 10"'  (1.778 m)   Wt 243 lb (110.2 kg)   SpO2 100%   BMI 34.87 kg/m   Filed Weights   01/13/20 1316  Weight: 243 lb (110.2 kg)    Physical Exam HENT:     Head: Normocephalic and atraumatic.     Mouth/Throat:     Pharynx: No oropharyngeal exudate.  Eyes:     Pupils: Pupils are equal, round, and reactive to light.  Cardiovascular:     Rate and Rhythm: Normal rate and regular rhythm.  Pulmonary:     Effort: No respiratory distress.     Breath sounds: No wheezing.  Abdominal:     General: Bowel sounds are normal. There is no distension.     Palpations: Abdomen is soft. There is no mass.     Tenderness: There is no abdominal tenderness. There is no guarding or rebound.  Musculoskeletal:        General: No tenderness. Normal range of motion.     Cervical back: Normal  range of motion and neck supple.  Skin:    General: Skin is warm.  Neurological:     Mental Status: He is alert and oriented to person, place, and time.  Psychiatric:        Mood and Affect: Affect normal.       LABORATORY DATA:  I have reviewed the data as listed    Component Value Date/Time   NA 137 01/13/2020 1257   NA 139 05/01/2014 1322   K 4.9 01/13/2020 1257   K 4.4 05/01/2014 1322   CL 105 01/13/2020 1257   CL 107 05/01/2014 1322   CO2 25 01/13/2020 1257   CO2 27 05/01/2014 1322   GLUCOSE 102 (H)  01/13/2020 1257   GLUCOSE 102 (H) 05/01/2014 1322   BUN 20 01/13/2020 1257   BUN 16 05/01/2014 1322   CREATININE 1.73 (H) 01/13/2020 1257   CREATININE 1.48 (H) 05/29/2014 0949   CALCIUM 9.3 01/13/2020 1257   CALCIUM 9.1 05/01/2014 1322   PROT 7.9 01/13/2020 1257   PROT 7.5 05/01/2014 1322   ALBUMIN 3.5 01/13/2020 1257   ALBUMIN 3.6 05/01/2014 1322   AST 19 01/13/2020 1257   AST 17 05/01/2014 1322   ALT 18 01/13/2020 1257   ALT 13 (L) 05/01/2014 1322   ALKPHOS 45 01/13/2020 1257   ALKPHOS 47 05/01/2014 1322   BILITOT 0.6 01/13/2020 1257   BILITOT 0.6 05/01/2014 1322   GFRNONAA 41 (L) 01/13/2020 1257   GFRNONAA 47 (L) 05/29/2014 0949   GFRAA 47 (L) 10/21/2019 1106   GFRAA 55 (L) 05/29/2014 0949    No results found for: SPEP, UPEP  Lab Results  Component Value Date   WBC 4.1 01/13/2020   NEUTROABS 2.4 01/13/2020   HGB 12.9 (L) 01/13/2020   HCT 39.6 01/13/2020   MCV 95.9 01/13/2020   PLT 449 (H) 01/13/2020      Chemistry      Component Value Date/Time   NA 137 01/13/2020 1257   NA 139 05/01/2014 1322   K 4.9 01/13/2020 1257   K 4.4 05/01/2014 1322   CL 105 01/13/2020 1257   CL 107 05/01/2014 1322   CO2 25 01/13/2020 1257   CO2 27 05/01/2014 1322   BUN 20 01/13/2020 1257   BUN 16 05/01/2014 1322   CREATININE 1.73 (H) 01/13/2020 1257   CREATININE 1.48 (H) 05/29/2014 0949      Component Value Date/Time   CALCIUM 9.3 01/13/2020 1257   CALCIUM  9.1 05/01/2014 1322   ALKPHOS 45 01/13/2020 1257   ALKPHOS 47 05/01/2014 1322   AST 19 01/13/2020 1257   AST 17 05/01/2014 1322   ALT 18 01/13/2020 1257   ALT 13 (L) 05/01/2014 1322   BILITOT 0.6 01/13/2020 1257   BILITOT 0.6 05/01/2014 1322       RADIOGRAPHIC STUDIES: I have personally reviewed the radiological images as listed and agreed with the findings in the report. No results found.   ASSESSMENT & PLAN:  Multiple myeloma in remission ([place]) #Multiple myeloma-most recently on maintenance Revlimid 15 mg 2  weeks on 2 week off.  OCT 2021 M-protine-NEG; kappa lambda light chain ratio=1.9;-clinically STABLE; await MM labs from today.  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 STABLE  #Bone modifying agent/multiple myeloma-- on zometa 83m every other month; ca- 9.3; STABLE.  on ca+vit D  Disposition: # Zometa # 1 month- cbc/cmp;  # Follow up in 8 weeks-  MD;  labs (CBC, CMP, kappa lambda light chains, multiple myeloma panel),  and zometa- Dr.B   No orders of the defined types were placed in this encounter.  All questions were answered. The patient knows to call the clinic with any problems, questions or concerns.      GCammie Sickle MD 01/13/2020 1:39 PM

## 2020-01-13 NOTE — Assessment & Plan Note (Signed)
#  Multiple myeloma-most recently on maintenance Revlimid 15 mg 2  weeks on 2 week off.  OCT 2021 M-protine-NEG; kappa lambda light chain ratio=1.9;-clinically STABLE; await MM labs from today.  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 STABLE  #Bone modifying agent/multiple myeloma-- on zometa $RemoveB'3mg'PbfqogGF$  every other month; ca- 9.3; STABLE.  on ca+vit D  Disposition: # Zometa # 1 month- cbc/cmp;  # Follow up in 8 weeks-  MD;  labs (CBC, CMP, kappa lambda light chains, multiple myeloma panel),  and zometa- Dr.B

## 2020-01-14 LAB — MULTIPLE MYELOMA PANEL, SERUM
Albumin SerPl Elph-Mcnc: 3.1 g/dL (ref 2.9–4.4)
Albumin/Glob SerPl: 0.8 (ref 0.7–1.7)
Alpha 1: 0.3 g/dL (ref 0.0–0.4)
Alpha2 Glob SerPl Elph-Mcnc: 0.8 g/dL (ref 0.4–1.0)
B-Globulin SerPl Elph-Mcnc: 1.2 g/dL (ref 0.7–1.3)
Gamma Glob SerPl Elph-Mcnc: 1.8 g/dL (ref 0.4–1.8)
Globulin, Total: 4.1 g/dL — ABNORMAL HIGH (ref 2.2–3.9)
IgA: 738 mg/dL — ABNORMAL HIGH (ref 61–437)
IgG (Immunoglobin G), Serum: 1789 mg/dL — ABNORMAL HIGH (ref 603–1613)
IgM (Immunoglobulin M), Srm: 46 mg/dL (ref 15–143)
Total Protein ELP: 7.2 g/dL (ref 6.0–8.5)

## 2020-01-14 LAB — KAPPA/LAMBDA LIGHT CHAINS
Kappa free light chain: 69.9 mg/L — ABNORMAL HIGH (ref 3.3–19.4)
Kappa, lambda light chain ratio: 1.98 — ABNORMAL HIGH (ref 0.26–1.65)
Lambda free light chains: 35.3 mg/L — ABNORMAL HIGH (ref 5.7–26.3)

## 2020-02-02 ENCOUNTER — Other Ambulatory Visit: Payer: Self-pay | Admitting: *Deleted

## 2020-02-02 DIAGNOSIS — C9001 Multiple myeloma in remission: Secondary | ICD-10-CM

## 2020-02-02 MED ORDER — LENALIDOMIDE 15 MG PO CAPS: ORAL_CAPSULE | ORAL | 0 refills | Status: DC

## 2020-02-10 ENCOUNTER — Inpatient Hospital Stay: Payer: Medicare HMO | Attending: Internal Medicine

## 2020-02-10 DIAGNOSIS — C9001 Multiple myeloma in remission: Secondary | ICD-10-CM | POA: Diagnosis not present

## 2020-02-10 LAB — CBC WITH DIFFERENTIAL/PLATELET
Abs Immature Granulocytes: 0.01 10*3/uL (ref 0.00–0.07)
Basophils Absolute: 0 10*3/uL (ref 0.0–0.1)
Basophils Relative: 1 %
Eosinophils Absolute: 0.3 10*3/uL (ref 0.0–0.5)
Eosinophils Relative: 8 %
HCT: 37.5 % — ABNORMAL LOW (ref 39.0–52.0)
Hemoglobin: 12.4 g/dL — ABNORMAL LOW (ref 13.0–17.0)
Immature Granulocytes: 0 %
Lymphocytes Relative: 25 %
Lymphs Abs: 0.8 10*3/uL (ref 0.7–4.0)
MCH: 31.7 pg (ref 26.0–34.0)
MCHC: 33.1 g/dL (ref 30.0–36.0)
MCV: 95.9 fL (ref 80.0–100.0)
Monocytes Absolute: 0.6 10*3/uL (ref 0.1–1.0)
Monocytes Relative: 17 %
Neutro Abs: 1.6 10*3/uL — ABNORMAL LOW (ref 1.7–7.7)
Neutrophils Relative %: 49 %
Platelets: 243 10*3/uL (ref 150–400)
RBC: 3.91 MIL/uL — ABNORMAL LOW (ref 4.22–5.81)
RDW: 16 % — ABNORMAL HIGH (ref 11.5–15.5)
WBC: 3.3 10*3/uL — ABNORMAL LOW (ref 4.0–10.5)
nRBC: 0 % (ref 0.0–0.2)

## 2020-02-10 LAB — COMPREHENSIVE METABOLIC PANEL
ALT: 12 U/L (ref 0–44)
AST: 15 U/L (ref 15–41)
Albumin: 3.5 g/dL (ref 3.5–5.0)
Alkaline Phosphatase: 52 U/L (ref 38–126)
Anion gap: 5 (ref 5–15)
BUN: 18 mg/dL (ref 8–23)
CO2: 26 mmol/L (ref 22–32)
Calcium: 8.9 mg/dL (ref 8.9–10.3)
Chloride: 104 mmol/L (ref 98–111)
Creatinine, Ser: 1.52 mg/dL — ABNORMAL HIGH (ref 0.61–1.24)
GFR, Estimated: 47 mL/min — ABNORMAL LOW (ref >60)
Glucose, Bld: 100 mg/dL — ABNORMAL HIGH (ref 70–99)
Potassium: 4.7 mmol/L (ref 3.5–5.1)
Sodium: 135 mmol/L (ref 135–145)
Total Bilirubin: 0.6 mg/dL (ref 0.3–1.2)
Total Protein: 7.3 g/dL (ref 6.5–8.1)

## 2020-02-27 ENCOUNTER — Other Ambulatory Visit: Payer: Self-pay | Admitting: *Deleted

## 2020-02-27 DIAGNOSIS — C9001 Multiple myeloma in remission: Secondary | ICD-10-CM

## 2020-02-27 MED ORDER — LENALIDOMIDE 15 MG PO CAPS: ORAL_CAPSULE | ORAL | 0 refills | Status: DC

## 2020-03-16 ENCOUNTER — Inpatient Hospital Stay: Payer: Medicare HMO | Attending: Internal Medicine

## 2020-03-16 ENCOUNTER — Other Ambulatory Visit: Payer: Self-pay

## 2020-03-16 ENCOUNTER — Inpatient Hospital Stay: Payer: Medicare HMO

## 2020-03-16 ENCOUNTER — Encounter: Payer: Self-pay | Admitting: Internal Medicine

## 2020-03-16 ENCOUNTER — Inpatient Hospital Stay (HOSPITAL_BASED_OUTPATIENT_CLINIC_OR_DEPARTMENT_OTHER): Payer: Medicare HMO | Admitting: Internal Medicine

## 2020-03-16 DIAGNOSIS — C9001 Multiple myeloma in remission: Secondary | ICD-10-CM | POA: Diagnosis present

## 2020-03-16 DIAGNOSIS — I129 Hypertensive chronic kidney disease with stage 1 through stage 4 chronic kidney disease, or unspecified chronic kidney disease: Secondary | ICD-10-CM | POA: Insufficient documentation

## 2020-03-16 DIAGNOSIS — K219 Gastro-esophageal reflux disease without esophagitis: Secondary | ICD-10-CM | POA: Insufficient documentation

## 2020-03-16 DIAGNOSIS — E1122 Type 2 diabetes mellitus with diabetic chronic kidney disease: Secondary | ICD-10-CM | POA: Diagnosis not present

## 2020-03-16 DIAGNOSIS — Z7984 Long term (current) use of oral hypoglycemic drugs: Secondary | ICD-10-CM | POA: Insufficient documentation

## 2020-03-16 DIAGNOSIS — Z7982 Long term (current) use of aspirin: Secondary | ICD-10-CM | POA: Insufficient documentation

## 2020-03-16 DIAGNOSIS — E785 Hyperlipidemia, unspecified: Secondary | ICD-10-CM | POA: Insufficient documentation

## 2020-03-16 DIAGNOSIS — Z79899 Other long term (current) drug therapy: Secondary | ICD-10-CM | POA: Diagnosis not present

## 2020-03-16 DIAGNOSIS — Z85850 Personal history of malignant neoplasm of thyroid: Secondary | ICD-10-CM | POA: Diagnosis not present

## 2020-03-16 DIAGNOSIS — N183 Chronic kidney disease, stage 3 unspecified: Secondary | ICD-10-CM | POA: Insufficient documentation

## 2020-03-16 LAB — CBC WITH DIFFERENTIAL/PLATELET
Abs Immature Granulocytes: 0.01 10*3/uL (ref 0.00–0.07)
Basophils Absolute: 0 10*3/uL (ref 0.0–0.1)
Basophils Relative: 1 %
Eosinophils Absolute: 0.1 10*3/uL (ref 0.0–0.5)
Eosinophils Relative: 2 %
HCT: 40.2 % (ref 39.0–52.0)
Hemoglobin: 13 g/dL (ref 13.0–17.0)
Immature Granulocytes: 0 %
Lymphocytes Relative: 25 %
Lymphs Abs: 0.8 10*3/uL (ref 0.7–4.0)
MCH: 30.8 pg (ref 26.0–34.0)
MCHC: 32.3 g/dL (ref 30.0–36.0)
MCV: 95.3 fL (ref 80.0–100.0)
Monocytes Absolute: 0.3 10*3/uL (ref 0.1–1.0)
Monocytes Relative: 11 %
Neutro Abs: 1.9 10*3/uL (ref 1.7–7.7)
Neutrophils Relative %: 61 %
Platelets: 249 10*3/uL (ref 150–400)
RBC: 4.22 MIL/uL (ref 4.22–5.81)
RDW: 15 % (ref 11.5–15.5)
WBC: 3.1 10*3/uL — ABNORMAL LOW (ref 4.0–10.5)
nRBC: 0 % (ref 0.0–0.2)

## 2020-03-16 LAB — COMPREHENSIVE METABOLIC PANEL
ALT: 14 U/L (ref 0–44)
AST: 16 U/L (ref 15–41)
Albumin: 3.7 g/dL (ref 3.5–5.0)
Alkaline Phosphatase: 47 U/L (ref 38–126)
Anion gap: 6 (ref 5–15)
BUN: 24 mg/dL — ABNORMAL HIGH (ref 8–23)
CO2: 26 mmol/L (ref 22–32)
Calcium: 8.9 mg/dL (ref 8.9–10.3)
Chloride: 105 mmol/L (ref 98–111)
Creatinine, Ser: 1.44 mg/dL — ABNORMAL HIGH (ref 0.61–1.24)
GFR, Estimated: 50 mL/min — ABNORMAL LOW (ref >60)
Glucose, Bld: 144 mg/dL — ABNORMAL HIGH (ref 70–99)
Potassium: 4.4 mmol/L (ref 3.5–5.1)
Sodium: 137 mmol/L (ref 135–145)
Total Bilirubin: 0.5 mg/dL (ref 0.3–1.2)
Total Protein: 7.8 g/dL (ref 6.5–8.1)

## 2020-03-16 MED ORDER — ZOLEDRONIC ACID 4 MG/5ML IV CONC
3.0000 mg | Freq: Once | INTRAVENOUS | Status: AC
  Administered 2020-03-16: 3 mg via INTRAVENOUS
  Filled 2020-03-16: qty 3.75

## 2020-03-16 MED ORDER — SODIUM CHLORIDE 0.9 % IV SOLN
Freq: Once | INTRAVENOUS | Status: AC
  Filled 2020-03-16: qty 250

## 2020-03-16 NOTE — Assessment & Plan Note (Addendum)
#  Multiple myeloma-most recently on maintenance Revlimid 15 mg 2  weeks on 2 week off.  DEC 2021 M-protine-NEG; kappa lambda light chain ratio=1.9;-clinically STABLE; await MM labs from today.  # On Revlimid 15 mg 2 weeks; 2 weeks OFF; Today WBC-3.1; hemoglobin/platelets normal.  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 STABLE  #Bone modifying agent/multiple myeloma-- on zometa 3 mg every other month; ca- 9.3; STABLE.  on ca+vit D  Disposition: # Zometa # 1 month- cbc/cmp;  # Follow up in 2 months -  MD;  labs [CBC, CMP, kappa lambda light chains, multiple myeloma panel]  and zometa- Dr.B

## 2020-03-16 NOTE — Progress Notes (Signed)
1210- Patient tolerated Zometa infusion well. Patient stable and discharged to home at this time.

## 2020-03-16 NOTE — Progress Notes (Signed)
[facility] OFFICE PROGRESS NOTE  Patient Care Team: Alan Hire, MD as PCP - General (Internal Medicine) Alan Brownie, MD as Consulting Physician (Dermatology) Alan Sickle, MD as Consulting Physician (Hematology and Oncology)  Cancer Staging No matching staging information was found for the patient.   Oncology History Overview Note   # April 2014- MULTIPLE MYELOMA [IgG- 2.2gm/dl; 40-50% plasma cell; Cyto-N; FISH- gain of chr.9, 15 & ccnd1/11q13] s/p RVD x6 [sep 2014; BMBx- ~5% plasma cells]; Maint Rev-DEX-Zometa; March 2015-Stopped Dex Cont- Rev-Zometa; July 2016- Rev 6m; NOV 4th  2016-HOLD; [facility] declined BMT.   # Re-START FEB 2017 Rev 161m3 W- on & 1 w OFF; HELD Rev June- Oct 2020- sec to worsening renal function [Dr.Singh]; SEP 2021- Rev 15 mg 2 w-On & 2 w-OFF.   # Zometa  # CKD [creat ~1.4; sec MM; Dr.Singh]; DM  -------------------------------------------------------------------------   DIAGNOSIS: [2[name] MULTIPLE MYELOMA  GOALS: control  CURRENT/MOST RECENT THERAPY- REVLIMID Maintenance [Feb 2017]    Multiple myeloma in remission ([facility] [place] 10/01/2015 Initial Diagnosis   Multiple myeloma in remission ([name]    INTERVAL HISTORY:  Alan Mey89.o.  male pleasant patient above history of multiple myeloma  on Revlimid maintenance is here for follow-up.  Patient denies any nausea vomiting diarrhea.  Denies any leg swelling.  Denies any fevers or chills.  No nausea vomiting abdominal pain.  No worsening back pain.  No choking.  Review of Systems  Constitutional: Negative for chills, diaphoresis, fever, malaise/fatigue and weight loss.  HENT: Negative for nosebleeds and sore throat.   Eyes: Negative for double vision.  Respiratory: Negative for cough, hemoptysis, sputum production, shortness of breath and wheezing.   Cardiovascular: Positive for leg swelling. Negative for chest pain, palpitations and orthopnea.   Gastrointestinal: Negative for abdominal pain, blood in stool, constipation, diarrhea, heartburn, melena, nausea and vomiting.  Genitourinary: Negative for dysuria, frequency and urgency.  Musculoskeletal: Negative for back pain and joint pain.  Skin: Negative.  Negative for itching and rash.  Neurological: Negative for dizziness, tingling, focal weakness, weakness and headaches.  Endo/Heme/Allergies: Does not bruise/bleed easily.  Psychiatric/Behavioral: Negative for depression. The patient is not nervous/anxious and does not have insomnia.       PAST MEDICAL HISTORY :  Past Medical History:  Diagnosis Date  . Diabetes mellitus without complication ([facility]  . GERD (gastroesophageal reflux disease)   . Hyperlipidemia   . Hypertension   . Multiple myeloma (HCSatellite [place]  . Obesity   . Thyroid cancer (HCCor[place]  . Thyroid disease     PAST SURGICAL HISTORY :   Past Surgical History:  Procedure Laterality Date  . BONE MARROW BIOPSY  2014  . CATARACT EXTRACTION BILATERAL W/ ANTERIOR VITRECTOMY      FAMILY HISTORY :   Family History  Problem Relation Age of Onset  . Cancer Father [id number]. Diabetes Mother     SOCIAL HISTORY:   Social History   Tobacco Use  . Smoking status: Never Smoker  . Smokeless tobacco: Never Used  Substance Use Topics  . Alcohol use: No  . Drug use: No    ALLERGIES:  has No Known Allergies.  MEDICATIONS:  Current Outpatient Medications  Medication Sig Dispense Refill  . amLODipine (NORVASC) 10 MG tablet Take 10 mg by mouth daily.    . Marland Kitchenspirin EC 81 MG tablet Take 81 mg by mouth daily.    . Marland Kitchentorvastatin (LIPITOR)  20 MG tablet Take 20 mg by mouth daily.    . cloNIDine (CATAPRES) 0.1 MG tablet Take 0.1 mg by mouth 2 (two) times daily.    . Cyanocobalamin 1000 MCG TBCR Take by mouth.    . famotidine (PEPCID) 20 MG tablet Take 1 tablet (20 mg total) by mouth 2 (two) times daily. 14 tablet 0  . lenalidomide (REVLIMID) 15 MG capsule Take 1 capsule (15 mg total)  by mouth daily for 2 weeks, then hold for 2 weeks off 14 capsule 0  . levothyroxine (SYNTHROID) 137 MCG tablet Take 1 tablet by mouth daily.     [name] Inject 1 Lancet as directed once daily.    . pantoprazole (PROTONIX) 40 MG tablet TAKE 1 TABLET BY MOUTH EVERY DAY 60 tablet 3  . pioglitazone (ACTOS) 30 MG tablet Take 30 mg by mouth daily.    . potassium chloride SA (K-DUR,KLOR-CON) 20 MEQ tablet Take 20 mEq by mouth 2 (two) times daily.   0  . quinapril (ACCUPRIL) 40 MG tablet Take 40 mg by mouth daily.    [name] torsemide (DEMADEX) 20 MG tablet Take 20 mg by mouth daily as needed.     No current facility-administered medications for this visit.   Facility-Administered Medications Ordered in Other Visits  Medication Dose Route Frequency Provider Last Rate Last Admin  . 0.9 %  sodium chloride infusion   Intravenous Continuous Alan Sickle, MD   Stopped at 12/11/14 1528    PHYSICAL EXAMINATION: ECOG PERFORMANCE STATUS: 0 - Asymptomatic  BP 139/76 (BP Location: Left Arm, Patient Position: Sitting, Cuff Size: Large)   Pulse 64   Temp 97.6 F (36.4 C) (Tympanic)   Resp 16   Ht '5\' 10"'  (1.778 m)   Wt 248 lb (112.5 kg)   SpO2 100%   BMI 35.58 kg/m   Filed Weights   03/16/20 1052  Weight: 248 lb (112.5 kg)    Physical Exam HENT:     Head: Normocephalic and atraumatic.     Mouth/Throat:     Pharynx: No oropharyngeal exudate.  Eyes:     Pupils: Pupils are equal, round, and reactive to light.  Cardiovascular:     Rate and Rhythm: Normal rate and regular rhythm.  Pulmonary:     Effort: No respiratory distress.     Breath sounds: No wheezing.  Abdominal:     General: Bowel sounds are normal. There is no distension.     Palpations: Abdomen is soft. There is no mass.     Tenderness: There is no abdominal tenderness. There is no guarding or rebound.  Musculoskeletal:        General: No tenderness. Normal range of motion.     Cervical back: Normal  range of motion and neck supple.  Skin:    General: Skin is warm.  Neurological:     Mental Status: He is alert and oriented to person, place, and time.  Psychiatric:        Mood and Affect: Affect normal.       LABORATORY DATA:  I have reviewed the data as listed    Component Value Date/Time   NA 137 03/16/2020 1038   NA 139 05/01/2014 1322   K 4.4 03/16/2020 1038   K 4.4 05/01/2014 1322   CL 105 03/16/2020 1038   CL 107 05/01/2014 1322   CO2 26 03/16/2020 1038   CO2 27 05/01/2014 1322   GLUCOSE 144 (H) 03/16/2020 1038  GLUCOSE 102 (H) 05/01/2014 1322   BUN 24 (H) 03/16/2020 1038   BUN 16 05/01/2014 1322   CREATININE 1.44 (H) 03/16/2020 1038   CREATININE 1.48 (H) 05/29/2014 0949   CALCIUM 8.9 03/16/2020 1038   CALCIUM 9.1 05/01/2014 1322   PROT 7.8 03/16/2020 1038   PROT 7.5 05/01/2014 1322   ALBUMIN 3.7 03/16/2020 1038   ALBUMIN 3.6 05/01/2014 1322   AST 16 03/16/2020 1038   AST 17 05/01/2014 1322   ALT 14 03/16/2020 1038   ALT 13 (L) 05/01/2014 1322   ALKPHOS 47 03/16/2020 1038   ALKPHOS 47 05/01/2014 1322   BILITOT 0.5 03/16/2020 1038   BILITOT 0.6 05/01/2014 1322   GFRNONAA 50 (L) 03/16/2020 1038   GFRNONAA 47 (L) 05/29/2014 0949   GFRAA 47 (L) 10/21/2019 1106   GFRAA 55 (L) 05/29/2014 0949    No results found for: SPEP, UPEP  Lab Results  Component Value Date   WBC 3.1 (L) 03/16/2020   NEUTROABS 1.9 03/16/2020   HGB 13.0 03/16/2020   HCT 40.2 03/16/2020   MCV 95.3 03/16/2020   PLT 249 03/16/2020      Chemistry      Component Value Date/Time   NA 137 03/16/2020 1038   NA 139 05/01/2014 1322   K 4.4 03/16/2020 1038   K 4.4 05/01/2014 1322   CL 105 03/16/2020 1038   CL 107 05/01/2014 1322   CO2 26 03/16/2020 1038   CO2 27 05/01/2014 1322   BUN 24 (H) 03/16/2020 1038   BUN 16 05/01/2014 1322   CREATININE 1.44 (H) 03/16/2020 1038   CREATININE 1.48 (H) 05/29/2014 0949      Component Value Date/Time   CALCIUM 8.9 03/16/2020 1038    CALCIUM 9.1 05/01/2014 1322   ALKPHOS 47 03/16/2020 1038   ALKPHOS 47 05/01/2014 1322   AST 16 03/16/2020 1038   AST 17 05/01/2014 1322   ALT 14 03/16/2020 1038   ALT 13 (L) 05/01/2014 1322   BILITOT 0.5 03/16/2020 1038   BILITOT 0.6 05/01/2014 1322       RADIOGRAPHIC STUDIES: I have personally reviewed the radiological images as listed and agreed with the findings in the report. No results found.   ASSESSMENT & PLAN:  Multiple myeloma in remission ([place]) #Multiple myeloma-most recently on maintenance Revlimid 15 mg 2  weeks on 2 week off.  DEC 2021 M-protine-NEG; kappa lambda light chain ratio=1.9;-clinically STABLE; await MM labs from today.  # On Revlimid 15 mg 2 weeks; 2 weeks OFF; Today WBC-3.1; hemoglobin/platelets normal.  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 STABLE  #Bone modifying agent/multiple myeloma-- on zometa 3 mg every other month; ca- 9.3; STABLE.  on ca+vit D  Disposition: # Zometa # 1 month- cbc/cmp;  # Follow up in 2 months -  MD;  labs [CBC, CMP, kappa lambda light chains, multiple myeloma panel]  and zometa- Dr.B   Orders Placed This Encounter  Procedures  . CBC with Differential    Standing Status:   Standing    Number of Occurrences:   8    Standing Expiration Date:   03/16/2021  . Comprehensive metabolic panel    Standing Status:   Standing    Number of Occurrences:   8    Standing Expiration Date:   03/16/2021  . Multiple Myeloma Panel (SPEP&IFE w/QIG)    Standing Status:   Standing    Number of Occurrences:   8    Standing Expiration Date:  03/16/2021  . Kappa/lambda light chains    Standing Status:   Standing    Number of Occurrences:   8    Standing Expiration Date:   03/16/2021  . Comprehensive metabolic panel    Standing Status:   Standing    Number of Occurrences:   12    Standing Expiration Date:   03/16/2021  . CBC with Differential    Standing Status:   Standing    Number of Occurrences:   12    Standing  Expiration Date:   03/16/2021   All questions were answered. The patient knows to call the clinic with any problems, questions or concerns.      Alan Sickle, MD 03/16/2020 1:45 PM

## 2020-03-17 LAB — KAPPA/LAMBDA LIGHT CHAINS
Kappa free light chain: 51.8 mg/L — ABNORMAL HIGH (ref 3.3–19.4)
Kappa, lambda light chain ratio: 1.86 — ABNORMAL HIGH (ref 0.26–1.65)
Lambda free light chains: 27.9 mg/L — ABNORMAL HIGH (ref 5.7–26.3)

## 2020-03-18 LAB — MULTIPLE MYELOMA PANEL, SERUM
Albumin SerPl Elph-Mcnc: 3.5 g/dL (ref 2.9–4.4)
Albumin/Glob SerPl: 1 (ref 0.7–1.7)
Alpha 1: 0.2 g/dL (ref 0.0–0.4)
Alpha2 Glob SerPl Elph-Mcnc: 0.7 g/dL (ref 0.4–1.0)
B-Globulin SerPl Elph-Mcnc: 1.3 g/dL (ref 0.7–1.3)
Gamma Glob SerPl Elph-Mcnc: 1.5 g/dL (ref 0.4–1.8)
Globulin, Total: 3.6 g/dL (ref 2.2–3.9)
IgA: 610 mg/dL — ABNORMAL HIGH (ref 61–437)
IgG (Immunoglobin G), Serum: 1516 mg/dL (ref 603–1613)
IgM (Immunoglobulin M), Srm: 43 mg/dL (ref 15–143)
Total Protein ELP: 7.1 g/dL (ref 6.0–8.5)

## 2020-03-24 ENCOUNTER — Other Ambulatory Visit: Payer: Self-pay | Admitting: *Deleted

## 2020-03-24 DIAGNOSIS — C9001 Multiple myeloma in remission: Secondary | ICD-10-CM

## 2020-03-24 MED ORDER — LENALIDOMIDE 15 MG PO CAPS: ORAL_CAPSULE | ORAL | 0 refills | Status: DC

## 2020-04-13 ENCOUNTER — Inpatient Hospital Stay: Payer: Medicare HMO | Attending: Internal Medicine

## 2020-04-13 DIAGNOSIS — C9001 Multiple myeloma in remission: Secondary | ICD-10-CM | POA: Insufficient documentation

## 2020-04-13 LAB — COMPREHENSIVE METABOLIC PANEL
ALT: 11 U/L (ref 0–44)
AST: 15 U/L (ref 15–41)
Albumin: 3.5 g/dL (ref 3.5–5.0)
Alkaline Phosphatase: 49 U/L (ref 38–126)
Anion gap: 8 (ref 5–15)
BUN: 16 mg/dL (ref 8–23)
CO2: 26 mmol/L (ref 22–32)
Calcium: 9.2 mg/dL (ref 8.9–10.3)
Chloride: 105 mmol/L (ref 98–111)
Creatinine, Ser: 1.41 mg/dL — ABNORMAL HIGH (ref 0.61–1.24)
GFR, Estimated: 52 mL/min — ABNORMAL LOW (ref >60)
Glucose, Bld: 117 mg/dL — ABNORMAL HIGH (ref 70–99)
Potassium: 4.8 mmol/L (ref 3.5–5.1)
Sodium: 139 mmol/L (ref 135–145)
Total Bilirubin: 0.9 mg/dL (ref 0.3–1.2)
Total Protein: 7.6 g/dL (ref 6.5–8.1)

## 2020-04-13 LAB — CBC WITH DIFFERENTIAL/PLATELET
Abs Immature Granulocytes: 0.03 10*3/uL (ref 0.00–0.07)
Basophils Absolute: 0 10*3/uL (ref 0.0–0.1)
Basophils Relative: 1 %
Eosinophils Absolute: 0.2 10*3/uL (ref 0.0–0.5)
Eosinophils Relative: 5 %
HCT: 37.6 % — ABNORMAL LOW (ref 39.0–52.0)
Hemoglobin: 12.3 g/dL — ABNORMAL LOW (ref 13.0–17.0)
Immature Granulocytes: 1 %
Lymphocytes Relative: 22 %
Lymphs Abs: 0.7 10*3/uL (ref 0.7–4.0)
MCH: 31.1 pg (ref 26.0–34.0)
MCHC: 32.7 g/dL (ref 30.0–36.0)
MCV: 94.9 fL (ref 80.0–100.0)
Monocytes Absolute: 0.5 10*3/uL (ref 0.1–1.0)
Monocytes Relative: 16 %
Neutro Abs: 1.8 10*3/uL (ref 1.7–7.7)
Neutrophils Relative %: 55 %
Platelets: 286 10*3/uL (ref 150–400)
RBC: 3.96 MIL/uL — ABNORMAL LOW (ref 4.22–5.81)
RDW: 14.2 % (ref 11.5–15.5)
WBC: 3.3 10*3/uL — ABNORMAL LOW (ref 4.0–10.5)
nRBC: 0 % (ref 0.0–0.2)

## 2020-04-26 ENCOUNTER — Other Ambulatory Visit: Payer: Self-pay | Admitting: *Deleted

## 2020-04-26 DIAGNOSIS — C9001 Multiple myeloma in remission: Secondary | ICD-10-CM

## 2020-04-26 MED ORDER — LENALIDOMIDE 15 MG PO CAPS: ORAL_CAPSULE | ORAL | 0 refills | Status: DC

## 2020-05-11 ENCOUNTER — Inpatient Hospital Stay: Payer: Medicare HMO | Attending: Internal Medicine

## 2020-05-11 ENCOUNTER — Encounter: Payer: Self-pay | Admitting: Internal Medicine

## 2020-05-11 ENCOUNTER — Inpatient Hospital Stay: Payer: Medicare HMO

## 2020-05-11 ENCOUNTER — Inpatient Hospital Stay (HOSPITAL_BASED_OUTPATIENT_CLINIC_OR_DEPARTMENT_OTHER): Payer: Medicare HMO | Admitting: Internal Medicine

## 2020-05-11 DIAGNOSIS — C9001 Multiple myeloma in remission: Secondary | ICD-10-CM | POA: Diagnosis not present

## 2020-05-11 DIAGNOSIS — Z7982 Long term (current) use of aspirin: Secondary | ICD-10-CM | POA: Diagnosis not present

## 2020-05-11 DIAGNOSIS — E785 Hyperlipidemia, unspecified: Secondary | ICD-10-CM | POA: Insufficient documentation

## 2020-05-11 DIAGNOSIS — R600 Localized edema: Secondary | ICD-10-CM | POA: Diagnosis not present

## 2020-05-11 DIAGNOSIS — Z79899 Other long term (current) drug therapy: Secondary | ICD-10-CM | POA: Insufficient documentation

## 2020-05-11 DIAGNOSIS — I129 Hypertensive chronic kidney disease with stage 1 through stage 4 chronic kidney disease, or unspecified chronic kidney disease: Secondary | ICD-10-CM | POA: Insufficient documentation

## 2020-05-11 DIAGNOSIS — E1122 Type 2 diabetes mellitus with diabetic chronic kidney disease: Secondary | ICD-10-CM | POA: Diagnosis not present

## 2020-05-11 DIAGNOSIS — N183 Chronic kidney disease, stage 3 unspecified: Secondary | ICD-10-CM | POA: Diagnosis not present

## 2020-05-11 DIAGNOSIS — K219 Gastro-esophageal reflux disease without esophagitis: Secondary | ICD-10-CM | POA: Insufficient documentation

## 2020-05-11 LAB — COMPREHENSIVE METABOLIC PANEL
ALT: 15 U/L (ref 0–44)
AST: 16 U/L (ref 15–41)
Albumin: 3.6 g/dL (ref 3.5–5.0)
Alkaline Phosphatase: 44 U/L (ref 38–126)
Anion gap: 8 (ref 5–15)
BUN: 21 mg/dL (ref 8–23)
CO2: 24 mmol/L (ref 22–32)
Calcium: 8.7 mg/dL — ABNORMAL LOW (ref 8.9–10.3)
Chloride: 107 mmol/L (ref 98–111)
Creatinine, Ser: 1.54 mg/dL — ABNORMAL HIGH (ref 0.61–1.24)
GFR, Estimated: 46 mL/min — ABNORMAL LOW (ref >60)
Glucose, Bld: 160 mg/dL — ABNORMAL HIGH (ref 70–99)
Potassium: 4.3 mmol/L (ref 3.5–5.1)
Sodium: 139 mmol/L (ref 135–145)
Total Bilirubin: 0.8 mg/dL (ref 0.3–1.2)
Total Protein: 7.4 g/dL (ref 6.5–8.1)

## 2020-05-11 LAB — CBC WITH DIFFERENTIAL/PLATELET
Abs Immature Granulocytes: 0.01 10*3/uL (ref 0.00–0.07)
Basophils Absolute: 0 10*3/uL (ref 0.0–0.1)
Basophils Relative: 1 %
Eosinophils Absolute: 0.2 10*3/uL (ref 0.0–0.5)
Eosinophils Relative: 8 %
HCT: 40.7 % (ref 39.0–52.0)
Hemoglobin: 13 g/dL (ref 13.0–17.0)
Immature Granulocytes: 0 %
Lymphocytes Relative: 23 %
Lymphs Abs: 0.7 10*3/uL (ref 0.7–4.0)
MCH: 30.7 pg (ref 26.0–34.0)
MCHC: 31.9 g/dL (ref 30.0–36.0)
MCV: 96.2 fL (ref 80.0–100.0)
Monocytes Absolute: 0.2 10*3/uL (ref 0.1–1.0)
Monocytes Relative: 8 %
Neutro Abs: 1.8 10*3/uL (ref 1.7–7.7)
Neutrophils Relative %: 60 %
Platelets: 222 10*3/uL (ref 150–400)
RBC: 4.23 MIL/uL (ref 4.22–5.81)
RDW: 15 % (ref 11.5–15.5)
WBC: 3 10*3/uL — ABNORMAL LOW (ref 4.0–10.5)
nRBC: 0 % (ref 0.0–0.2)

## 2020-05-11 NOTE — Progress Notes (Signed)
[place] OFFICE PROGRESS NOTE  Patient Care Team: Baxter Hire, MD as PCP - General (Internal Medicine) Druscilla Brownie, MD as Consulting Physician (Dermatology) Cammie Sickle, MD as Consulting Physician (Hematology and Oncology)  Cancer Staging No matching staging information was found for the patient.   Oncology History Overview Note   # April 2014- MULTIPLE MYELOMA [IgG- 2.2gm/dl; 40-50% plasma cell; Cyto-N; FISH- gain of chr.9, 15 & ccnd1/11q13] s/p RVD x6 [sep 2014; BMBx- ~5% plasma cells]; Maint Rev-DEX-Zometa; March 2015-Stopped Dex Cont- Rev-Zometa; July 2016- Rev 30m; NOV 4th  2016-HOLD; [facility] declined BMT.   # Re-START FEB 2017 Rev 127m3 W- on & 1 w OFF; HELD Rev June- Oct 2020- sec to worsening renal function [Dr.Singh]; SEP 2021- Rev 15 mg 2 w-On & 2 w-OFF.   # Zometa  # CKD [creat ~1.4; sec MM; Dr.Singh]; DM  -------------------------------------------------------------------------   DIAGNOSIS: [2[name] MULTIPLE MYELOMA  GOALS: control  CURRENT/MOST RECENT THERAPY- REVLIMID Maintenance [Feb 2017]    Multiple [facility] in remission ([facility] 10/01/2015 Initial Diagnosis   Multiple [facility]oma in remission ([facility]    INTERVAL HISTORY:  ThAbrahim Sargent89.o.  male pleasant patient above history of multiple myeloma  on Revlimid maintenance is here for follow-up.  Patient denies any worsening fatigue.  Denies any worsening swelling of the legs.  No nausea vomiting with abdominal pain.  No worsening back pain.  Denies any jaw pain.  Review of Systems  Constitutional: Negative for chills, diaphoresis, fever, malaise/fatigue and weight loss.  HENT: Negative for nosebleeds and sore throat.   Eyes: Negative for double vision.  Respiratory: Negative for cough, hemoptysis, sputum production, shortness of breath and wheezing.   Cardiovascular: Positive for leg swelling. Negative for chest pain, palpitations and orthopnea.   Gastrointestinal: Negative for abdominal pain, blood in stool, constipation, diarrhea, heartburn, melena, nausea and vomiting.  Genitourinary: Negative for dysuria, frequency and urgency.  Musculoskeletal: Negative for back pain and joint pain.  Skin: Negative.  Negative for itching and rash.  Neurological: Negative for dizziness, tingling, focal weakness, weakness and headaches.  Endo/Heme/Allergies: Does not bruise/bleed easily.  Psychiatric/Behavioral: Negative for depression. The patient is not nervous/anxious and does not have insomnia.       PAST MEDICAL HISTORY :  Past Medical History:  Diagnosis Date  . Diabetes mellitus without complication ([facility]  . GERD (gastroesophageal reflux disease)   . Hyperlipidemia   . Hypertension   . Multiple myeloma ([facility]  . Obesity   . Thyroid cancer ([facility] [place]  . Thyroid disease     PAST SURGICAL HISTORY :   Past Surgical History:  Procedure Laterality Date  . BONE MARROW BIOPSY  2014  . CATARACT EXTRACTION BILATERAL W/ ANTERIOR VITRECTOMY      FAMILY HISTORY :   Family History  Problem Relation Age of Onset  . Cancer Father 638. Diabetes Mother     SOCIAL HISTORY:   Social History   Tobacco Use  . Smoking status: Never Smoker  . Smokeless tobacco: Never Used  Substance Use Topics  . Alcohol use: No  . Drug use: No    ALLERGIES:  has No Known Allergies.  MEDICATIONS:  Current Outpatient Medications  Medication Sig Dispense Refill  . amLODipine (NORVASC) 10 MG tablet Take 10 mg by mouth daily.    . Marland Kitchenspirin EC 81 MG tablet Take 81 mg by mouth daily.    . Marland Kitchentorvastatin (LIPITOR) 20  MG tablet Take 20 mg by mouth daily.    . cloNIDine (CATAPRES) 0.1 MG tablet Take 0.1 mg by mouth 2 (two) times daily.    . Cyanocobalamin 1000 MCG TBCR Take by mouth.    . famotidine (PEPCID) 20 MG tablet Take 1 tablet (20 mg total) by mouth 2 (two) times daily. 14 tablet 0  . lenalidomide (REVLIMID) 15 MG capsule Take 1 capsule (15 mg total)  by mouth daily for 2 weeks, then hold for 2 weeks off 14 capsule 0  . levothyroxine (SYNTHROID) 137 MCG tablet Take 1 tablet by mouth daily.     [name] DELICA LANCETS 74F MISC Inject 1 Lancet as directed once daily.    . pantoprazole (PROTONIX) 40 MG tablet TAKE 1 TABLET BY MOUTH EVERY DAY 60 tablet 3  . pioglitazone (ACTOS) 30 MG tablet Take 30 mg by mouth daily.    . potassium chloride SA (K-DUR,KLOR-CON) 20 MEQ tablet Take 20 mEq by mouth 2 (two) times daily.   0  . quinapril (ACCUPRIL) 40 MG tablet Take 40 mg by mouth daily.    [name] torsemide (DEMADEX) 20 MG tablet Take 20 mg by mouth daily as needed.     No current facility-administered medications for this visit.   Facility-Administered Medications Ordered in Other Visits  Medication Dose Route Frequency Provider Last Rate Last Admin  . 0.9 %  sodium chloride infusion   Intravenous Continuous Cammie Sickle, MD   Stopped at 12/11/14 1528    PHYSICAL EXAMINATION: ECOG PERFORMANCE STATUS: 0 - Asymptomatic  BP 128/60 (BP Location: Left Arm, Patient Position: Sitting, Cuff Size: Large)   Pulse (!) 54   Temp (!) 97.1 F (36.2 C) (Tympanic)   Resp 16   Ht '5\' 10"'  (1.778 m)   Wt 247 lb (112 kg)   SpO2 100%   BMI 35.44 kg/m   Filed Weights   05/11/20 1030  Weight: 247 lb (112 kg)    Physical Exam HENT:     Head: Normocephalic and atraumatic.     Mouth/Throat:     Pharynx: No oropharyngeal exudate.  Eyes:     Pupils: Pupils are equal, round, and reactive to light.  Cardiovascular:     Rate and Rhythm: Normal rate and regular rhythm.  Pulmonary:     Effort: No respiratory distress.     Breath sounds: No wheezing.  Abdominal:     General: Bowel sounds are normal. There is no distension.     Palpations: Abdomen is soft. There is no mass.     Tenderness: There is no abdominal tenderness. There is no guarding or rebound.  Musculoskeletal:        General: No tenderness. Normal range of motion.     Cervical back:  Normal range of motion and neck supple.  Skin:    General: Skin is warm.  Neurological:     Mental Status: He is alert and oriented to person, place, and time.  Psychiatric:        Mood and Affect: Affect normal.       LABORATORY DATA:  I have reviewed the data as listed    Component Value Date/Time   NA 139 05/11/2020 1005   NA 139 05/01/2014 1322   K 4.3 05/11/2020 1005   K 4.4 05/01/2014 1322   CL 107 05/11/2020 1005   CL 107 05/01/2014 1322   CO2 24 05/11/2020 1005   CO2 27 05/01/2014 1322   GLUCOSE 160 (H) 05/11/2020 1005  GLUCOSE 102 (H) 05/01/2014 1322   BUN 21 05/11/2020 1005   BUN 16 05/01/2014 1322   CREATININE 1.54 (H) 05/11/2020 1005   CREATININE 1.48 (H) 05/29/2014 0949   CALCIUM 8.7 (L) 05/11/2020 1005   CALCIUM 9.1 05/01/2014 1322   PROT 7.4 05/11/2020 1005   PROT 7.5 05/01/2014 1322   ALBUMIN 3.6 05/11/2020 1005   ALBUMIN 3.6 05/01/2014 1322   AST 16 05/11/2020 1005   AST 17 05/01/2014 1322   ALT 15 05/11/2020 1005   ALT 13 (L) 05/01/2014 1322   ALKPHOS 44 05/11/2020 1005   ALKPHOS 47 05/01/2014 1322   BILITOT 0.8 05/11/2020 1005   BILITOT 0.6 05/01/2014 1322   GFRNONAA 46 (L) 05/11/2020 1005   GFRNONAA 47 (L) 05/29/2014 0949   GFRAA 47 (L) 10/21/2019 1106   GFRAA 55 (L) 05/29/2014 0949    No results found for: SPEP, UPEP  Lab Results  Component Value Date   WBC 3.0 (L) 05/11/2020   NEUTROABS 1.8 05/11/2020   HGB 13.0 05/11/2020   HCT 40.7 05/11/2020   MCV 96.2 05/11/2020   PLT 222 05/11/2020      Chemistry      Component Value Date/Time   NA 139 05/11/2020 1005   NA 139 05/01/2014 1322   K 4.3 05/11/2020 1005   K 4.4 05/01/2014 1322   CL 107 05/11/2020 1005   CL 107 05/01/2014 1322   CO2 24 05/11/2020 1005   CO2 27 05/01/2014 1322   BUN 21 05/11/2020 1005   BUN 16 05/01/2014 1322   CREATININE 1.54 (H) 05/11/2020 1005   CREATININE 1.48 (H) 05/29/2014 0949      Component Value Date/Time   CALCIUM 8.7 (L) 05/11/2020 1005    CALCIUM 9.1 05/01/2014 1322   ALKPHOS 44 05/11/2020 1005   ALKPHOS 47 05/01/2014 1322   AST 16 05/11/2020 1005   AST 17 05/01/2014 1322   ALT 15 05/11/2020 1005   ALT 13 (L) 05/01/2014 1322   BILITOT 0.8 05/11/2020 1005   BILITOT 0.6 05/01/2014 1322       RADIOGRAPHIC STUDIES: I have personally reviewed the radiological images as listed and agreed with the findings in the report. No results found.   ASSESSMENT & PLAN:  Multiple myeloma in remission ([place]) #Multiple myeloma-most recently on maintenance Revlimid 15 mg 2  weeks on 2 week off.  FEB 2022 M-protine-NEG; kappa lambda light chain ratio=1 85clinically  STABLEawait MM labs from today.  # On Revlimid 15 mg 2 weeks; 2 weeks OFF; Today ANC 1.8; hemoglobin/platelets normal.  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 STABLE  #Bone modifying agent/multiple myeloma-- on zometa 3 mg every other month; ca- 8.7; slightly LOW: HOLD zometa today.  on ca+vit D  Disposition: # HOLD Zometa # 1 month- cbc/cmp;  # Follow up in 2 months -  MD;  labs [facility], CMP, kappa lambda light chains, multiple myeloma panel]  and zometa- Dr.B   No orders of the defined types were placed in this encounter.  All questions were answered. The patient knows to call the clinic with any problems, questions or concerns.      Cammie Sickle, MD 05/11/2020 11:26 AM

## 2020-05-11 NOTE — Assessment & Plan Note (Signed)
#  Multiple myeloma-most recently on maintenance Revlimid 15 mg 2  weeks on 2 week off.  FEB 2022 M-protine-NEG; kappa lambda light chain ratio=1 85clinically  STABLEawait MM labs from today.  # On Revlimid 15 mg 2 weeks; 2 weeks OFF; Today ANC 1.8; hemoglobin/platelets normal.  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 STABLE  #Bone modifying agent/multiple myeloma-- on zometa 3 mg every other month; ca- 8.7; slightly LOW: HOLD zometa today.  on ca+vit D  Disposition: # HOLD Zometa # 1 month- cbc/cmp;  # Follow up in 2 months -  MD;  labs [facility], CMP, kappa lambda light chains, multiple myeloma panel]  and zometa- Dr.B

## 2020-05-12 LAB — KAPPA/LAMBDA LIGHT CHAINS
Kappa free light chain: 76.9 mg/L — ABNORMAL HIGH (ref 3.3–19.4)
Kappa, lambda light chain ratio: 2.01 — ABNORMAL HIGH (ref 0.26–1.65)
Lambda free light chains: 38.3 mg/L — ABNORMAL HIGH (ref 5.7–26.3)

## 2020-05-13 LAB — MULTIPLE MYELOMA PANEL, SERUM
Albumin SerPl Elph-Mcnc: 3.5 g/dL (ref 2.9–4.4)
Albumin/Glob SerPl: 1.1 (ref 0.7–1.7)
Alpha 1: 0.2 g/dL (ref 0.0–0.4)
Alpha2 Glob SerPl Elph-Mcnc: 0.7 g/dL (ref 0.4–1.0)
B-Globulin SerPl Elph-Mcnc: 1.1 g/dL (ref 0.7–1.3)
Gamma Glob SerPl Elph-Mcnc: 1.4 g/dL (ref 0.4–1.8)
Globulin, Total: 3.5 g/dL (ref 2.2–3.9)
IgA: 589 mg/dL — ABNORMAL HIGH (ref 61–437)
IgG (Immunoglobin G), Serum: 1360 mg/dL (ref 603–1613)
IgM (Immunoglobulin M), Srm: 32 mg/dL (ref 15–143)
Total Protein ELP: 7 g/dL (ref 6.0–8.5)

## 2020-05-24 ENCOUNTER — Other Ambulatory Visit: Payer: Self-pay

## 2020-05-24 DIAGNOSIS — C9001 Multiple myeloma in remission: Secondary | ICD-10-CM

## 2020-05-24 MED ORDER — LENALIDOMIDE 15 MG PO CAPS: ORAL_CAPSULE | ORAL | 0 refills | Status: DC

## 2020-06-08 ENCOUNTER — Other Ambulatory Visit: Payer: Self-pay

## 2020-06-08 ENCOUNTER — Inpatient Hospital Stay: Payer: Medicare HMO | Attending: Internal Medicine

## 2020-06-08 DIAGNOSIS — C9001 Multiple myeloma in remission: Secondary | ICD-10-CM | POA: Diagnosis not present

## 2020-06-08 LAB — COMPREHENSIVE METABOLIC PANEL
ALT: 16 U/L (ref 0–44)
AST: 16 U/L (ref 15–41)
Albumin: 3.6 g/dL (ref 3.5–5.0)
Alkaline Phosphatase: 48 U/L (ref 38–126)
Anion gap: 9 (ref 5–15)
BUN: 23 mg/dL (ref 8–23)
CO2: 24 mmol/L (ref 22–32)
Calcium: 8.7 mg/dL — ABNORMAL LOW (ref 8.9–10.3)
Chloride: 106 mmol/L (ref 98–111)
Creatinine, Ser: 1.66 mg/dL — ABNORMAL HIGH (ref 0.61–1.24)
GFR, Estimated: 42 mL/min — ABNORMAL LOW (ref >60)
Glucose, Bld: 137 mg/dL — ABNORMAL HIGH (ref 70–99)
Potassium: 4.1 mmol/L (ref 3.5–5.1)
Sodium: 139 mmol/L (ref 135–145)
Total Bilirubin: 0.7 mg/dL (ref 0.3–1.2)
Total Protein: 7.2 g/dL (ref 6.5–8.1)

## 2020-06-08 LAB — CBC WITH DIFFERENTIAL/PLATELET
Abs Immature Granulocytes: 0.03 10*3/uL (ref 0.00–0.07)
Basophils Absolute: 0 10*3/uL (ref 0.0–0.1)
Basophils Relative: 0 %
Eosinophils Absolute: 0.2 10*3/uL (ref 0.0–0.5)
Eosinophils Relative: 8 %
HCT: 37.6 % — ABNORMAL LOW (ref 39.0–52.0)
Hemoglobin: 12.1 g/dL — ABNORMAL LOW (ref 13.0–17.0)
Immature Granulocytes: 1 %
Lymphocytes Relative: 24 %
Lymphs Abs: 0.7 10*3/uL (ref 0.7–4.0)
MCH: 30.7 pg (ref 26.0–34.0)
MCHC: 32.2 g/dL (ref 30.0–36.0)
MCV: 95.4 fL (ref 80.0–100.0)
Monocytes Absolute: 0.3 10*3/uL (ref 0.1–1.0)
Monocytes Relative: 10 %
Neutro Abs: 1.7 10*3/uL (ref 1.7–7.7)
Neutrophils Relative %: 57 %
Platelets: 241 10*3/uL (ref 150–400)
RBC: 3.94 MIL/uL — ABNORMAL LOW (ref 4.22–5.81)
RDW: 15.7 % — ABNORMAL HIGH (ref 11.5–15.5)
WBC: 3 10*3/uL — ABNORMAL LOW (ref 4.0–10.5)
nRBC: 0 % (ref 0.0–0.2)

## 2020-06-18 ENCOUNTER — Telehealth: Payer: Self-pay | Admitting: *Deleted

## 2020-06-18 DIAGNOSIS — C9001 Multiple myeloma in remission: Secondary | ICD-10-CM

## 2020-06-18 MED ORDER — LENALIDOMIDE 15 MG PO CAPS: ORAL_CAPSULE | ORAL | 0 refills | Status: DC

## 2020-06-18 NOTE — Telephone Encounter (Signed)
Rf - revlimid sent to [facility]. [id number] rems survey #

## 2020-07-12 ENCOUNTER — Other Ambulatory Visit: Payer: Self-pay

## 2020-07-12 DIAGNOSIS — C9001 Multiple myeloma in remission: Secondary | ICD-10-CM

## 2020-07-12 MED ORDER — LENALIDOMIDE 15 MG PO CAPS: ORAL_CAPSULE | ORAL | 0 refills | Status: DC

## 2020-07-13 ENCOUNTER — Inpatient Hospital Stay: Payer: Medicare HMO

## 2020-07-13 ENCOUNTER — Inpatient Hospital Stay: Payer: Medicare HMO | Attending: Internal Medicine

## 2020-07-13 ENCOUNTER — Encounter: Payer: Self-pay | Admitting: Internal Medicine

## 2020-07-13 ENCOUNTER — Inpatient Hospital Stay (HOSPITAL_BASED_OUTPATIENT_CLINIC_OR_DEPARTMENT_OTHER): Payer: Medicare HMO | Admitting: Internal Medicine

## 2020-07-13 DIAGNOSIS — C9001 Multiple myeloma in remission: Secondary | ICD-10-CM | POA: Diagnosis not present

## 2020-07-13 DIAGNOSIS — Z79899 Other long term (current) drug therapy: Secondary | ICD-10-CM | POA: Diagnosis not present

## 2020-07-13 DIAGNOSIS — Z85850 Personal history of malignant neoplasm of thyroid: Secondary | ICD-10-CM | POA: Insufficient documentation

## 2020-07-13 DIAGNOSIS — Z7984 Long term (current) use of oral hypoglycemic drugs: Secondary | ICD-10-CM | POA: Insufficient documentation

## 2020-07-13 DIAGNOSIS — N183 Chronic kidney disease, stage 3 unspecified: Secondary | ICD-10-CM | POA: Diagnosis not present

## 2020-07-13 DIAGNOSIS — Z7982 Long term (current) use of aspirin: Secondary | ICD-10-CM | POA: Insufficient documentation

## 2020-07-13 DIAGNOSIS — E785 Hyperlipidemia, unspecified: Secondary | ICD-10-CM | POA: Insufficient documentation

## 2020-07-13 DIAGNOSIS — I10 Essential (primary) hypertension: Secondary | ICD-10-CM | POA: Diagnosis not present

## 2020-07-13 DIAGNOSIS — E119 Type 2 diabetes mellitus without complications: Secondary | ICD-10-CM | POA: Insufficient documentation

## 2020-07-13 DIAGNOSIS — E079 Disorder of thyroid, unspecified: Secondary | ICD-10-CM | POA: Diagnosis not present

## 2020-07-13 LAB — COMPREHENSIVE METABOLIC PANEL
ALT: 19 U/L (ref 0–44)
AST: 20 U/L (ref 15–41)
Albumin: 3.5 g/dL (ref 3.5–5.0)
Alkaline Phosphatase: 47 U/L (ref 38–126)
Anion gap: 8 (ref 5–15)
BUN: 22 mg/dL (ref 8–23)
CO2: 23 mmol/L (ref 22–32)
Calcium: 8.7 mg/dL — ABNORMAL LOW (ref 8.9–10.3)
Chloride: 105 mmol/L (ref 98–111)
Creatinine, Ser: 1.42 mg/dL — ABNORMAL HIGH (ref 0.61–1.24)
GFR, Estimated: 51 mL/min — ABNORMAL LOW (ref >60)
Glucose, Bld: 125 mg/dL — ABNORMAL HIGH (ref 70–99)
Potassium: 4.4 mmol/L (ref 3.5–5.1)
Sodium: 136 mmol/L (ref 135–145)
Total Bilirubin: 0.6 mg/dL (ref 0.3–1.2)
Total Protein: 7.5 g/dL (ref 6.5–8.1)

## 2020-07-13 LAB — CBC WITH DIFFERENTIAL/PLATELET
Abs Immature Granulocytes: 0.03 10*3/uL (ref 0.00–0.07)
Basophils Absolute: 0 10*3/uL (ref 0.0–0.1)
Basophils Relative: 0 %
Eosinophils Absolute: 0.2 10*3/uL (ref 0.0–0.5)
Eosinophils Relative: 8 %
HCT: 38.2 % — ABNORMAL LOW (ref 39.0–52.0)
Hemoglobin: 12.5 g/dL — ABNORMAL LOW (ref 13.0–17.0)
Immature Granulocytes: 1 %
Lymphocytes Relative: 26 %
Lymphs Abs: 0.8 10*3/uL (ref 0.7–4.0)
MCH: 31 pg (ref 26.0–34.0)
MCHC: 32.7 g/dL (ref 30.0–36.0)
MCV: 94.8 fL (ref 80.0–100.0)
Monocytes Absolute: 0.5 10*3/uL (ref 0.1–1.0)
Monocytes Relative: 17 %
Neutro Abs: 1.5 10*3/uL — ABNORMAL LOW (ref 1.7–7.7)
Neutrophils Relative %: 48 %
Platelets: 183 10*3/uL (ref 150–400)
RBC: 4.03 MIL/uL — ABNORMAL LOW (ref 4.22–5.81)
RDW: 15.4 % (ref 11.5–15.5)
WBC: 3.1 10*3/uL — ABNORMAL LOW (ref 4.0–10.5)
nRBC: 0 % (ref 0.0–0.2)

## 2020-07-13 NOTE — Assessment & Plan Note (Addendum)
#  Multiple myeloma-most recently on maintenance Revlimid 15 mg 2  weeks on 2 week off.  APRIL 2022 M-protine-NEG; kappa lambda light chain ratio=1 85. clinically STABLE-await MM labs from today.  # On Revlimid 15 mg 2 weeks; 2 weeks OFF; Today ANC 1.5; hemoglobin/platelets normal.  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  Stable  #Bone modifying agent/multiple myeloma-- on zometa 3 mg every other month; ca- 8.7; slightly LOW: HOLD zometa today.  Recommended continued compliance with ca+vit D  Disposition: # HOLD Zometa # 1 month- cbc/cmp;  # Follow up in 2 months -  MD;  labs [CBC, CMP, kappa lambda light chains, multiple myeloma panel]  and zometa- Dr.B

## 2020-07-13 NOTE — Progress Notes (Signed)
Pt in for follow up, denies any concerns today. Reports is currently "on off days of revlimid" cycle.

## 2020-07-13 NOTE — Progress Notes (Signed)
[place] OFFICE PROGRESS NOTE  Patient Care Team: Baxter Hire, MD as PCP - General (Internal Medicine) Druscilla Brownie, MD as Consulting Physician (Dermatology) Cammie Sickle, MD as Consulting Physician (Hematology and Oncology)  Cancer Staging No matching staging information was found for the patient.   Oncology History Overview Note   # April 2014- MULTIPLE MYELOMA [IgG- 2.2gm/dl; 40-50% plasma cell; Cyto-N; FISH- gain of chr.9, 15 & ccnd1/11q13] s/p RVD x6 [sep 2014; BMBx- ~5% plasma cells]; Maint Rev-DEX-Zometa; March 2015-Stopped Dex Cont- Rev-Zometa; July 2016- Rev 60m; NOV 4th  2016-HOLD; [facility] declined BMT.   # Re-START FEB 2017 Rev 138m3 W- on & 1 w OFF; HELD Rev June- Oct 2020- sec to worsening renal function [Dr.Singh]; SEP 2021- Rev 15 mg 2 w-On & 2 w-OFF.   # Zometa  # CKD [creat ~1.4; sec MM; Dr.Singh]; DM  -------------------------------------------------------------------------   DIAGNOSIS: [2[name] MULTIPLE MYELOMA  GOALS: control  CURRENT/MOST RECENT THERAPY- REVLIMID Maintenance [Feb 2017]    Multiple [facility]oma in remission ([facility] 10/01/2015 Initial Diagnosis   [place] [facility] ([facility]    INTERVAL HISTORY:  [name] pleasant patient above history of multiple myeloma  on Revlimid maintenance is here for follow-up.  Patient denies any worsening fatigue.  Denies any new shortness of breath or cough.  Denies any worsening swelling of the legs.  No worsening back pain.  No jaw pain.  Admits to be compliant with catheters vitamin D.  Review of Systems  Constitutional: Negative for chills, diaphoresis, fever, malaise/fatigue and weight loss.  HENT: Negative for nosebleeds and sore throat.   Eyes: Negative for double vision.  Respiratory: Negative for cough, hemoptysis, sputum production, shortness of breath and wheezing.   Cardiovascular: Positive for leg swelling. Negative for  chest pain, palpitations and orthopnea.  Gastrointestinal: Negative for abdominal pain, blood in stool, constipation, diarrhea, heartburn, melena, nausea and vomiting.  Genitourinary: Negative for dysuria, frequency and urgency.  Musculoskeletal: Negative for back pain and joint pain.  Skin: Negative.  Negative for itching and rash.  Neurological: Negative for dizziness, tingling, focal weakness, weakness and headaches.  Endo/Heme/Allergies: Does not bruise/bleed easily.  Psychiatric/Behavioral: Negative for depression. The patient is not nervous/anxious and does not have insomnia.       PAST MEDICAL HISTORY :  Past Medical History:  Diagnosis Date  . Diabetes mellitus without complication ([facility]CL[facility]  . GERD (gastroesophageal reflux disease)   . Hyperlipidemia   . Hypertension   . Multiple myeloma (HCAren[place]  . Obesity   . Thyroid cancer ([facility]  . Thyroid disease     PAST SURGICAL HISTORY :   Past Surgical History:  Procedure Laterality Date  . BONE MARROW BIOPSY  2014  . CATARACT EXTRACTION BILATERAL W/ ANTERIOR VITRECTOMY      FAMILY HISTORY :   Family History  Problem Relation Age of Onset  . Cancer Father 6316. Diabetes Mother     SOCIAL HISTORY:   Social History   Tobacco Use  . Smoking status: Never Smoker  . Smokeless tobacco: Never Used  Substance Use Topics  . Alcohol use: No  . Drug use: No    ALLERGIES:  has No Known Allergies.  MEDICATIONS:  Current Outpatient Medications  Medication Sig Dispense Refill  . amLODipine (NORVASC) 10 MG tablet Take 10 mg by mouth daily.    . Marland Kitchenspirin EC 81 MG tablet Take 81 mg  by mouth daily.    [name] atorvastatin (LIPITOR) 20 MG tablet Take 20 mg by mouth daily.    . cloNIDine (CATAPRES) 0.1 MG tablet Take 0.1 mg by mouth 2 (two) times daily.    . Cyanocobalamin 1000 MCG TBCR Take by mouth.    . famotidine (PEPCID) 20 MG tablet Take 1 tablet (20 mg total) by mouth 2 (two) times daily. 14 tablet 0  . lenalidomide (REVLIMID) 15  MG capsule Take 1 capsule (15 mg total) by mouth daily for 2 weeks, then hold for 2 weeks off 14 capsule 0  . levothyroxine (SYNTHROID) 137 MCG tablet Take 1 tablet by mouth daily.     [name] DELICA LANCETS 01B MISC Inject 1 Lancet as directed once daily.    . pioglitazone (ACTOS) 30 MG tablet Take 30 mg by mouth daily.    . potassium chloride SA (K-DUR,KLOR-CON) 20 MEQ tablet Take 20 mEq by mouth 2 (two) times daily.   0  . quinapril (ACCUPRIL) 40 MG tablet Take 40 mg by mouth daily.    [name] torsemide (DEMADEX) 20 MG tablet Take 20 mg by mouth daily as needed.    . pantoprazole (PROTONIX) 40 MG tablet TAKE 1 TABLET BY MOUTH EVERY DAY (Patient not taking: Reported on 07/13/2020) 60 tablet 3   No current facility-administered medications for this visit.   Facility-Administered Medications Ordered in Other Visits  Medication Dose Route Frequency Provider Last Rate Last Admin  . 0.9 %  sodium chloride infusion   Intravenous Continuous Cammie Sickle, MD   Stopped at 12/11/14 1528    PHYSICAL EXAMINATION: ECOG PERFORMANCE STATUS: 0 - Asymptomatic  BP 137/72 (BP Location: Left Arm, Patient Position: Sitting)   Pulse (!) 51   Temp 97.6 F (36.4 C) (Tympanic)   Resp 18   Wt 249 lb (112.9 kg)   SpO2 100%   BMI 35.73 kg/m   Filed Weights   07/13/20 1003  Weight: 249 lb (112.9 kg)    Physical Exam HENT:     Head: Normocephalic and atraumatic.     Mouth/Throat:     Pharynx: No oropharyngeal exudate.  Eyes:     Pupils: Pupils are equal, round, and reactive to light.  Cardiovascular:     Rate and Rhythm: Normal rate and regular rhythm.  Pulmonary:     Effort: No respiratory distress.     Breath sounds: No wheezing.  Abdominal:     General: Bowel sounds are normal. There is no distension.     Palpations: Abdomen is soft. There is no mass.     Tenderness: There is no abdominal tenderness. There is no guarding or rebound.  Musculoskeletal:        General: No tenderness.  Normal range of motion.     Cervical back: Normal range of motion and neck supple.  Skin:    General: Skin is warm.  Neurological:     Mental Status: He is alert and oriented to person, place, and time.  Psychiatric:        Mood and Affect: Affect normal.       LABORATORY DATA:  I have reviewed the data as listed    Component Value Date/Time   NA 136 07/13/2020 0941   NA 139 05/01/2014 1322   K 4.4 07/13/2020 0941   K 4.4 05/01/2014 1322   CL 105 07/13/2020 0941   CL 107 05/01/2014 1322   CO2 23 07/13/2020 0941   CO2 27 05/01/2014 1322  GLUCOSE 125 (H) 07/13/2020 0941   GLUCOSE 102 (H) 05/01/2014 1322   BUN 22 07/13/2020 0941   BUN 16 05/01/2014 1322   CREATININE 1.42 (H) 07/13/2020 0941   CREATININE 1.48 (H) 05/29/2014 0949   CALCIUM 8.7 (L) 07/13/2020 0941   CALCIUM 9.1 05/01/2014 1322   PROT 7.5 07/13/2020 0941   PROT 7.5 05/01/2014 1322   ALBUMIN 3.5 07/13/2020 0941   ALBUMIN 3.6 05/01/2014 1322   AST 20 07/13/2020 0941   AST 17 05/01/2014 1322   ALT 19 07/13/2020 0941   ALT 13 (L) 05/01/2014 1322   ALKPHOS 47 07/13/2020 0941   ALKPHOS 47 05/01/2014 1322   BILITOT 0.6 07/13/2020 0941   BILITOT 0.6 05/01/2014 1322   GFRNONAA 51 (L) 07/13/2020 0941   GFRNONAA 47 (L) 05/29/2014 0949   GFRAA 47 (L) 10/21/2019 1106   GFRAA 55 (L) 05/29/2014 0949    No results found for: SPEP, UPEP  Lab Results  Component Value Date   WBC 3.1 (L) 07/13/2020   NEUTROABS 1.5 (L) 07/13/2020   HGB 12.5 (L) 07/13/2020   HCT 38.2 (L) 07/13/2020   MCV 94.8 07/13/2020   PLT 183 07/13/2020      Chemistry      Component Value Date/Time   NA 136 07/13/2020 0941   NA 139 05/01/2014 1322   K 4.4 07/13/2020 0941   K 4.4 05/01/2014 1322   CL 105 07/13/2020 0941   CL 107 05/01/2014 1322   CO2 23 07/13/2020 0941   CO2 27 05/01/2014 1322   BUN 22 07/13/2020 0941   BUN 16 05/01/2014 1322   CREATININE 1.42 (H) 07/13/2020 0941   CREATININE 1.48 (H) 05/29/2014 0949       Component Value Date/Time   CALCIUM 8.7 (L) 07/13/2020 0941   CALCIUM 9.1 05/01/2014 1322   ALKPHOS 47 07/13/2020 0941   ALKPHOS 47 05/01/2014 1322   AST 20 07/13/2020 0941   AST 17 05/01/2014 1322   ALT 19 07/13/2020 0941   ALT 13 (L) 05/01/2014 1322   BILITOT 0.6 07/13/2020 0941   BILITOT 0.6 05/01/2014 1322       RADIOGRAPHIC STUDIES: I have personally reviewed the radiological images as listed and agreed with the findings in the report. No results found.   ASSESSMENT & PLAN:  Multiple myeloma in remission ([place]) #Multiple myeloma-most recently on maintenance Revlimid 15 mg 2  weeks on 2 week off.  APRIL 2022 M-protine-NEG; kappa lambda light chain ratio=1 85. clinically STABLE-await MM labs from today.  # On Revlimid 15 mg 2 weeks; 2 weeks OFF; Today ANC 1.5; hemoglobin/platelets normal.  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  Stable  #Bone modifying agent/multiple myeloma-- on zometa 3 mg every other month; ca- 8.7; slightly LOW: HOLD zometa today.  Recommended continued compliance with ca+vit D  Disposition: # HOLD Zometa # 1 month- cbc/cmp;  # Follow up in 2 months -  MD;  labs [CBC, CMP, kappa lambda light chains, multiple myeloma panel]  and zometa- Dr.B   No orders of the defined types were placed in this encounter.  All questions were answered. The patient knows to call the clinic with any problems, questions or concerns.      Cammie Sickle, MD 07/13/2020 12:42 PM

## 2020-07-14 LAB — KAPPA/LAMBDA LIGHT CHAINS
Kappa free light chain: 81.6 mg/L — ABNORMAL HIGH (ref 3.3–19.4)
Kappa, lambda light chain ratio: 1.88 — ABNORMAL HIGH (ref 0.26–1.65)
Lambda free light chains: 43.4 mg/L — ABNORMAL HIGH (ref 5.7–26.3)

## 2020-07-16 LAB — MULTIPLE MYELOMA PANEL, SERUM
Albumin SerPl Elph-Mcnc: 3.5 g/dL (ref 2.9–4.4)
Albumin/Glob SerPl: 1.1 (ref 0.7–1.7)
Alpha 1: 0.2 g/dL (ref 0.0–0.4)
Alpha2 Glob SerPl Elph-Mcnc: 0.7 g/dL (ref 0.4–1.0)
B-Globulin SerPl Elph-Mcnc: 1.1 g/dL (ref 0.7–1.3)
Gamma Glob SerPl Elph-Mcnc: 1.3 g/dL (ref 0.4–1.8)
Globulin, Total: 3.3 g/dL (ref 2.2–3.9)
IgA: 614 mg/dL — ABNORMAL HIGH (ref 61–437)
IgG (Immunoglobin G), Serum: 1374 mg/dL (ref 603–1613)
IgM (Immunoglobulin M), Srm: 38 mg/dL (ref 15–143)
Total Protein ELP: 6.8 g/dL (ref 6.0–8.5)

## 2020-08-05 ENCOUNTER — Other Ambulatory Visit: Payer: Self-pay | Admitting: *Deleted

## 2020-08-05 DIAGNOSIS — C9001 Multiple myeloma in remission: Secondary | ICD-10-CM

## 2020-08-05 MED ORDER — LENALIDOMIDE 15 MG PO CAPS: ORAL_CAPSULE | ORAL | 0 refills | Status: DC

## 2020-08-10 ENCOUNTER — Inpatient Hospital Stay: Payer: Medicare HMO | Attending: Internal Medicine

## 2020-08-10 DIAGNOSIS — C9001 Multiple myeloma in remission: Secondary | ICD-10-CM | POA: Insufficient documentation

## 2020-08-10 LAB — CBC WITH DIFFERENTIAL/PLATELET
Abs Immature Granulocytes: 0.04 10*3/uL (ref 0.00–0.07)
Basophils Absolute: 0 10*3/uL (ref 0.0–0.1)
Basophils Relative: 0 %
Eosinophils Absolute: 0.3 10*3/uL (ref 0.0–0.5)
Eosinophils Relative: 8 %
HCT: 35.4 % — ABNORMAL LOW (ref 39.0–52.0)
Hemoglobin: 11.4 g/dL — ABNORMAL LOW (ref 13.0–17.0)
Immature Granulocytes: 1 %
Lymphocytes Relative: 26 %
Lymphs Abs: 0.8 10*3/uL (ref 0.7–4.0)
MCH: 31 pg (ref 26.0–34.0)
MCHC: 32.2 g/dL (ref 30.0–36.0)
MCV: 96.2 fL (ref 80.0–100.0)
Monocytes Absolute: 0.7 10*3/uL (ref 0.1–1.0)
Monocytes Relative: 21 %
Neutro Abs: 1.4 10*3/uL — ABNORMAL LOW (ref 1.7–7.7)
Neutrophils Relative %: 44 %
Platelets: 151 10*3/uL (ref 150–400)
RBC: 3.68 MIL/uL — ABNORMAL LOW (ref 4.22–5.81)
RDW: 15.3 % (ref 11.5–15.5)
WBC: 3.2 10*3/uL — ABNORMAL LOW (ref 4.0–10.5)
nRBC: 0 % (ref 0.0–0.2)

## 2020-08-10 LAB — COMPREHENSIVE METABOLIC PANEL
ALT: 21 U/L (ref 0–44)
AST: 19 U/L (ref 15–41)
Albumin: 3.4 g/dL — ABNORMAL LOW (ref 3.5–5.0)
Alkaline Phosphatase: 51 U/L (ref 38–126)
Anion gap: 6 (ref 5–15)
BUN: 18 mg/dL (ref 8–23)
CO2: 25 mmol/L (ref 22–32)
Calcium: 8.4 mg/dL — ABNORMAL LOW (ref 8.9–10.3)
Chloride: 106 mmol/L (ref 98–111)
Creatinine, Ser: 1.7 mg/dL — ABNORMAL HIGH (ref 0.61–1.24)
GFR, Estimated: 41 mL/min — ABNORMAL LOW (ref >60)
Glucose, Bld: 138 mg/dL — ABNORMAL HIGH (ref 70–99)
Potassium: 4.3 mmol/L (ref 3.5–5.1)
Sodium: 137 mmol/L (ref 135–145)
Total Bilirubin: 0.7 mg/dL (ref 0.3–1.2)
Total Protein: 6.9 g/dL (ref 6.5–8.1)

## 2020-09-09 ENCOUNTER — Other Ambulatory Visit: Payer: Self-pay

## 2020-09-09 DIAGNOSIS — C9001 Multiple myeloma in remission: Secondary | ICD-10-CM

## 2020-09-09 MED ORDER — LENALIDOMIDE 15 MG PO CAPS: ORAL_CAPSULE | ORAL | 0 refills | Status: DC

## 2020-09-15 ENCOUNTER — Inpatient Hospital Stay: Payer: Medicare HMO | Attending: Internal Medicine

## 2020-09-15 ENCOUNTER — Inpatient Hospital Stay (HOSPITAL_BASED_OUTPATIENT_CLINIC_OR_DEPARTMENT_OTHER): Payer: Medicare HMO | Admitting: Internal Medicine

## 2020-09-15 ENCOUNTER — Other Ambulatory Visit: Payer: Self-pay

## 2020-09-15 ENCOUNTER — Inpatient Hospital Stay: Payer: Medicare HMO

## 2020-09-15 DIAGNOSIS — C9001 Multiple myeloma in remission: Secondary | ICD-10-CM | POA: Diagnosis present

## 2020-09-15 DIAGNOSIS — E1122 Type 2 diabetes mellitus with diabetic chronic kidney disease: Secondary | ICD-10-CM | POA: Insufficient documentation

## 2020-09-15 DIAGNOSIS — I129 Hypertensive chronic kidney disease with stage 1 through stage 4 chronic kidney disease, or unspecified chronic kidney disease: Secondary | ICD-10-CM | POA: Insufficient documentation

## 2020-09-15 DIAGNOSIS — N183 Chronic kidney disease, stage 3 unspecified: Secondary | ICD-10-CM | POA: Insufficient documentation

## 2020-09-15 LAB — COMPREHENSIVE METABOLIC PANEL
ALT: 14 U/L (ref 0–44)
AST: 17 U/L (ref 15–41)
Albumin: 3.5 g/dL (ref 3.5–5.0)
Alkaline Phosphatase: 51 U/L (ref 38–126)
Anion gap: 5 (ref 5–15)
BUN: 19 mg/dL (ref 8–23)
CO2: 24 mmol/L (ref 22–32)
Calcium: 8.9 mg/dL (ref 8.9–10.3)
Chloride: 106 mmol/L (ref 98–111)
Creatinine, Ser: 1.47 mg/dL — ABNORMAL HIGH (ref 0.61–1.24)
GFR, Estimated: 49 mL/min — ABNORMAL LOW (ref >60)
Glucose, Bld: 113 mg/dL — ABNORMAL HIGH (ref 70–99)
Potassium: 4.4 mmol/L (ref 3.5–5.1)
Sodium: 135 mmol/L (ref 135–145)
Total Bilirubin: 0.6 mg/dL (ref 0.3–1.2)
Total Protein: 7.4 g/dL (ref 6.5–8.1)

## 2020-09-15 LAB — CBC WITH DIFFERENTIAL/PLATELET
Abs Immature Granulocytes: 0.01 10*3/uL (ref 0.00–0.07)
Basophils Absolute: 0 10*3/uL (ref 0.0–0.1)
Basophils Relative: 1 %
Eosinophils Absolute: 0.1 10*3/uL (ref 0.0–0.5)
Eosinophils Relative: 2 %
HCT: 38.3 % — ABNORMAL LOW (ref 39.0–52.0)
Hemoglobin: 12.5 g/dL — ABNORMAL LOW (ref 13.0–17.0)
Immature Granulocytes: 0 %
Lymphocytes Relative: 27 %
Lymphs Abs: 0.9 10*3/uL (ref 0.7–4.0)
MCH: 30.9 pg (ref 26.0–34.0)
MCHC: 32.6 g/dL (ref 30.0–36.0)
MCV: 94.6 fL (ref 80.0–100.0)
Monocytes Absolute: 0.7 10*3/uL (ref 0.1–1.0)
Monocytes Relative: 21 %
Neutro Abs: 1.7 10*3/uL (ref 1.7–7.7)
Neutrophils Relative %: 49 %
Platelets: 216 10*3/uL (ref 150–400)
RBC: 4.05 MIL/uL — ABNORMAL LOW (ref 4.22–5.81)
RDW: 15 % (ref 11.5–15.5)
WBC: 3.4 10*3/uL — ABNORMAL LOW (ref 4.0–10.5)
nRBC: 0 % (ref 0.0–0.2)

## 2020-09-15 NOTE — Progress Notes (Signed)
[place] OFFICE PROGRESS NOTE  Patient Care Team: Baxter Hire, MD as PCP - General (Internal Medicine) Druscilla Brownie, MD as Consulting Physician (Dermatology) Cammie Sickle, MD as Consulting Physician (Hematology and Oncology)  Cancer Staging No matching staging information was found for the patient.   Oncology History Overview Note   # April 2014- MULTIPLE MYELOMA [IgG- 2.2gm/dl; 40-50% plasma cell; Cyto-N; FISH- gain of chr.9, 15 & ccnd1/11q13] s/p RVD x6 [sep 2014; BMBx- ~5% plasma cells]; Maint Rev-DEX-Zometa; March 2015-Stopped Dex Cont- Rev-Zometa; July 2016- Rev 21m; NOV 4th  2016-HOLD; [facility] declined BMT.   # Re-START FEB 2017 Rev 165m3 W- on & 1 w OFF; HELD Rev June- Oct 2020- sec to worsening renal function [Dr.Singh]; SEP 2021- Rev 15 mg 2 w-On & 2 w-OFF.   # Zometa  # CKD [creat ~1.4; sec MM; Dr.Singh]; DM  -------------------------------------------------------------------------   Alexander: [2[name] MULTIPLE MYELOMA  GOALS: control  CURRENT/MOST RECENT THERAPY- REVLIMID Maintenance [Feb 2017]    Multiple myeloma in remission (Alan Alexander   Multiple myeloma in remission (Alan Alexander     INTERVAL HISTORY:  Alan Monger89.o.  male pleasant patient above history of multiple myeloma  on Revlimid maintenance [2 weeks on 2-week off] is here for follow-up.  Patient denies any shortness of breath or cough.  Denies any swelling of the legs.  No worsening back pain.   Review of Systems  Constitutional:  Negative for chills, diaphoresis, fever, malaise/fatigue and weight loss.  HENT:  Negative for nosebleeds and sore throat.   Eyes:  Negative for double vision.  Respiratory:  Negative for cough, hemoptysis, sputum production, shortness of breath and wheezing.   Cardiovascular:  Positive for leg swelling. Negative for chest pain, palpitations and orthopnea.  Gastrointestinal:  Negative for  abdominal pain, blood in stool, constipation, diarrhea, heartburn, melena, nausea and vomiting.  Genitourinary:  Negative for dysuria, frequency and urgency.  Musculoskeletal:  Negative for back pain and joint pain.  Skin: Negative.  Negative for itching and rash.  Neurological:  Negative for dizziness, tingling, focal weakness, weakness and headaches.  Endo/Heme/Allergies:  Does not bruise/bleed easily.  Psychiatric/Behavioral:  Negative for depression. The patient is not nervous/anxious and does not have insomnia.      PAST MEDICAL HISTORY :  Past Medical History:  Alexander Date  . Diabetes mellitus without complication ([facility]atthe[facility]  . GERD (gastroesophageal reflux disease)   . Hyperlipidemia   . Hypertension   . Multiple myeloma ([facility]  . Obesity   . Thyroid cancer (HCMar[facility]  . Thyroid disease     PAST SURGICAL HISTORY :   Past Surgical History:  Procedure Laterality Date  . BONE MARROW BIOPSY  2014  . CATARACT EXTRACTION BILATERAL W/ ANTERIOR VITRECTOMY      FAMILY HISTORY :   Family History  Problem Relation Age of Onset  . Cancer Father [id number]. Diabetes Mother     SOCIAL HISTORY:   Social History   Tobacco Use  . Smoking status: Never  . Smokeless tobacco: Never  Substance Use Topics  . Alcohol use: No  . Drug use: No    ALLERGIES:  has No Known Allergies.  MEDICATIONS:  Current Outpatient Medications  Medication Sig Dispense Refill  . amLODipine (NORVASC) 10 MG tablet Take 10 mg by mouth daily.    . Marland Kitchenspirin EC 81 MG tablet Take 81 mg by mouth daily.    .[name]  atorvastatin (LIPITOR) 20 MG tablet Take 20 mg by mouth daily.    . cloNIDine (CATAPRES) 0.1 MG tablet Take 0.1 mg by mouth 2 (two) times daily.    . Cyanocobalamin 1000 MCG TBCR Take by mouth.    . famotidine (PEPCID) 20 MG tablet Take 1 tablet (20 mg total) by mouth 2 (two) times daily. 14 tablet 0  . lenalidomide (REVLIMID) 15 MG capsule Take 1 capsule (15 mg total) by mouth daily for 2 weeks, then hold for 2  weeks off 14 capsule 0  . levothyroxine (SYNTHROID) 137 MCG tablet Take 1 tablet by mouth daily.     [name] DELICA LANCETS 56C MISC Inject 1 Lancet as directed once daily.    . pioglitazone (ACTOS) 30 MG tablet Take 30 mg by mouth daily.    . potassium chloride SA (K-DUR,KLOR-CON) 20 MEQ tablet Take 20 mEq by mouth 2 (two) times daily.   0  . quinapril (ACCUPRIL) 40 MG tablet Take 40 mg by mouth daily.    [name] torsemide (DEMADEX) 20 MG tablet Take 20 mg by mouth daily as needed.     No current facility-administered medications for this visit.   Facility-Administered Medications Ordered in Other Visits  Medication Dose Route Frequency Provider Last Rate Last Admin  . 0.9 %  sodium chloride infusion   Intravenous Continuous Cammie Sickle, MD   Stopped at 12/11/14 1528    PHYSICAL EXAMINATION: ECOG PERFORMANCE STATUS: 0 - Asymptomatic  BP (!) 141/75   Pulse (!) 59   Temp 97.9 F (36.6 C) (Tympanic)   Resp 20   Ht '5\' 10"'  (1.778 m)   Wt 254 lb (115.2 kg)   BMI 36.45 kg/m   Filed Weights   09/15/20 1322  Weight: 254 lb (115.2 kg)    Physical Exam HENT:     Head: Normocephalic and atraumatic.     Mouth/Throat:     Pharynx: No oropharyngeal exudate.  Eyes:     Pupils: Pupils are equal, round, and reactive to light.  Cardiovascular:     Rate and Rhythm: Normal rate and regular rhythm.  Pulmonary:     Effort: No respiratory distress.     Breath sounds: No wheezing.  Abdominal:     General: Bowel sounds are normal. There is no distension.     Palpations: Abdomen is soft. There is no mass.     Tenderness: no abdominal tenderness There is no guarding or rebound.  Musculoskeletal:        General: No tenderness. Normal range of motion.     Cervical back: Normal range of motion and neck supple.  Skin:    General: Skin is warm.  Neurological:     Mental Status: He is alert and oriented to person, place, and time.  Psychiatric:        Mood and Affect: Affect normal.       LABORATORY DATA:  I have reviewed the data as listed    Component Value Date/Time   NA 135 09/15/2020 1303   NA 139 05/01/2014 1322   K 4.4 09/15/2020 1303   K 4.4 05/01/2014 1322   CL 106 09/15/2020 1303   CL 107 05/01/2014 1322   CO2 24 09/15/2020 1303   CO2 27 05/01/2014 1322   GLUCOSE 113 (H) 09/15/2020 1303   GLUCOSE 102 (H) 05/01/2014 1322   BUN 19 09/15/2020 1303   BUN 16 05/01/2014 1322   CREATININE 1.47 (H) 09/15/2020 1303   CREATININE 1.48 (H)  05/29/2014 0949   CALCIUM 8.9 09/15/2020 1303   CALCIUM 9.1 05/01/2014 1322   PROT 7.4 09/15/2020 1303   PROT 7.5 05/01/2014 1322   ALBUMIN 3.5 09/15/2020 1303   ALBUMIN 3.6 05/01/2014 1322   AST 17 09/15/2020 1303   AST 17 05/01/2014 1322   ALT 14 09/15/2020 1303   ALT 13 (L) 05/01/2014 1322   ALKPHOS 51 09/15/2020 1303   ALKPHOS 47 05/01/2014 1322   BILITOT 0.6 09/15/2020 1303   BILITOT 0.6 05/01/2014 1322   GFRNONAA 49 (L) 09/15/2020 1303   GFRNONAA 47 (L) 05/29/2014 0949   GFRAA 47 (L) 10/21/2019 1106   GFRAA 55 (L) 05/29/2014 0949    No results found for: SPEP, UPEP  Lab Results  Component Value Date   WBC 3.4 (L) 09/15/2020   NEUTROABS 1.7 09/15/2020   HGB 12.5 (L) 09/15/2020   HCT 38.3 (L) 09/15/2020   MCV 94.6 09/15/2020   PLT 216 09/15/2020      Chemistry      Component Value Date/Time   NA 135 09/15/2020 1303   NA 139 05/01/2014 1322   K 4.4 09/15/2020 1303   K 4.4 05/01/2014 1322   CL 106 09/15/2020 1303   CL 107 05/01/2014 1322   CO2 24 09/15/2020 1303   CO2 27 05/01/2014 1322   BUN 19 09/15/2020 1303   BUN 16 05/01/2014 1322   CREATININE 1.47 (H) 09/15/2020 1303   CREATININE 1.48 (H) 05/29/2014 0949      Component Value Date/Time   CALCIUM 8.9 09/15/2020 1303   CALCIUM 9.1 05/01/2014 1322   ALKPHOS 51 09/15/2020 1303   ALKPHOS 47 05/01/2014 1322   AST 17 09/15/2020 1303   AST 17 05/01/2014 1322   ALT 14 09/15/2020 1303   ALT 13 (L) 05/01/2014 1322   BILITOT 0.6  09/15/2020 1303   BILITOT 0.6 05/01/2014 1322       RADIOGRAPHIC STUDIES: I have personally reviewed the radiological images as listed and agreed with the findings in the report. No results found.   ASSESSMENT & PLAN:  Multiple myeloma in remission ([place]) #Multiple myeloma-most recently on maintenance Revlimid 15 mg 2  weeks on 2 week off.  JUNE 2022 M-protine-NEG; kappa lambda light chain ratio=1 85. clinically STABLE-await MM labs from today. Stable  # On Revlimid 15 mg 2 weeks; 2 weeks OFF; Today ANC 1.7; hemoglobin/platelets normal.  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  Stable  #Bone modifying agent/multiple myeloma-- on zometa 3 mg every other month; ca- 8.7; slightly LOW: HOLD zometa today.  Recommended continued compliance with ca+vit D  Disposition: # HOLD ZOMETA # 1 month- cbc/cmp;  # Follow up in 2 months -  MD;  labs [CBC, CMP, kappa lambda light chains, multiple myeloma panel]  and zometa- Dr.B   No orders of the defined types were placed in this encounter.  All questions were answered. The patient knows to call the clinic with any problems, questions or concerns.      Cammie Sickle, MD 09/15/2020 2:24 PM

## 2020-09-15 NOTE — Assessment & Plan Note (Signed)
#  Multiple myeloma-most recently on maintenance Revlimid 15 mg 2  weeks on 2 week off.  JUNE 2022 M-protine-NEG; kappa lambda light chain ratio=1 85. clinically STABLE-await MM labs from today. Stable  # On Revlimid 15 mg 2 weeks; 2 weeks OFF; Today ANC 1.7; hemoglobin/platelets normal.  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  Stable  #Bone modifying agent/multiple myeloma-- on zometa 3 mg every other month; ca- 8.7; slightly LOW: HOLD zometa today.  Recommended continued compliance with ca+vit D  Disposition: # HOLD ZOMETA # 1 month- cbc/cmp;  # Follow up in 2 months -  MD;  labs [CBC, CMP, kappa lambda light chains, multiple myeloma panel]  and zometa- Dr.B

## 2020-09-16 LAB — KAPPA/LAMBDA LIGHT CHAINS
Kappa free light chain: 56.8 mg/L — ABNORMAL HIGH (ref 3.3–19.4)
Kappa, lambda light chain ratio: 1.87 — ABNORMAL HIGH (ref 0.26–1.65)
Lambda free light chains: 30.4 mg/L — ABNORMAL HIGH (ref 5.7–26.3)

## 2020-09-20 LAB — MULTIPLE MYELOMA PANEL, SERUM
Albumin SerPl Elph-Mcnc: 3.2 g/dL (ref 2.9–4.4)
Albumin/Glob SerPl: 1 (ref 0.7–1.7)
Alpha 1: 0.2 g/dL (ref 0.0–0.4)
Alpha2 Glob SerPl Elph-Mcnc: 0.7 g/dL (ref 0.4–1.0)
B-Globulin SerPl Elph-Mcnc: 1.2 g/dL (ref 0.7–1.3)
Gamma Glob SerPl Elph-Mcnc: 1.3 g/dL (ref 0.4–1.8)
Globulin, Total: 3.4 g/dL (ref 2.2–3.9)
IgA: 558 mg/dL — ABNORMAL HIGH (ref 61–437)
IgG (Immunoglobin G), Serum: 1431 mg/dL (ref 603–1613)
IgM (Immunoglobulin M), Srm: 43 mg/dL (ref 15–143)
Total Protein ELP: 6.6 g/dL (ref 6.0–8.5)

## 2020-10-08 ENCOUNTER — Other Ambulatory Visit: Payer: Self-pay | Admitting: *Deleted

## 2020-10-08 DIAGNOSIS — C9001 Multiple myeloma in remission: Secondary | ICD-10-CM

## 2020-10-08 MED ORDER — LENALIDOMIDE 15 MG PO CAPS: ORAL_CAPSULE | ORAL | 0 refills | Status: DC

## 2020-10-18 ENCOUNTER — Inpatient Hospital Stay: Payer: Medicare HMO | Attending: Internal Medicine

## 2020-10-18 DIAGNOSIS — C9001 Multiple myeloma in remission: Secondary | ICD-10-CM

## 2020-10-18 LAB — COMPREHENSIVE METABOLIC PANEL
ALT: 16 U/L (ref 0–44)
AST: 18 U/L (ref 15–41)
Albumin: 3.8 g/dL (ref 3.5–5.0)
Alkaline Phosphatase: 54 U/L (ref 38–126)
Anion gap: 3 — ABNORMAL LOW (ref 5–15)
BUN: 24 mg/dL — ABNORMAL HIGH (ref 8–23)
CO2: 27 mmol/L (ref 22–32)
Calcium: 9.3 mg/dL (ref 8.9–10.3)
Chloride: 106 mmol/L (ref 98–111)
Creatinine, Ser: 1.48 mg/dL — ABNORMAL HIGH (ref 0.61–1.24)
GFR, Estimated: 49 mL/min — ABNORMAL LOW (ref >60)
Glucose, Bld: 133 mg/dL — ABNORMAL HIGH (ref 70–99)
Potassium: 5.2 mmol/L — ABNORMAL HIGH (ref 3.5–5.1)
Sodium: 136 mmol/L (ref 135–145)
Total Bilirubin: 0.5 mg/dL (ref 0.3–1.2)
Total Protein: 7.6 g/dL (ref 6.5–8.1)

## 2020-10-18 LAB — CBC WITH DIFFERENTIAL/PLATELET
Abs Immature Granulocytes: 0.04 10*3/uL (ref 0.00–0.07)
Basophils Absolute: 0 10*3/uL (ref 0.0–0.1)
Basophils Relative: 1 %
Eosinophils Absolute: 0.2 10*3/uL (ref 0.0–0.5)
Eosinophils Relative: 5 %
HCT: 40.2 % (ref 39.0–52.0)
Hemoglobin: 12.9 g/dL — ABNORMAL LOW (ref 13.0–17.0)
Immature Granulocytes: 1 %
Lymphocytes Relative: 23 %
Lymphs Abs: 0.8 10*3/uL (ref 0.7–4.0)
MCH: 30.4 pg (ref 26.0–34.0)
MCHC: 32.1 g/dL (ref 30.0–36.0)
MCV: 94.8 fL (ref 80.0–100.0)
Monocytes Absolute: 0.4 10*3/uL (ref 0.1–1.0)
Monocytes Relative: 13 %
Neutro Abs: 1.9 10*3/uL (ref 1.7–7.7)
Neutrophils Relative %: 57 %
Platelets: 265 10*3/uL (ref 150–400)
RBC: 4.24 MIL/uL (ref 4.22–5.81)
RDW: 14.9 % (ref 11.5–15.5)
WBC: 3.3 10*3/uL — ABNORMAL LOW (ref 4.0–10.5)
nRBC: 0 % (ref 0.0–0.2)

## 2020-11-05 ENCOUNTER — Other Ambulatory Visit: Payer: Self-pay | Admitting: *Deleted

## 2020-11-05 DIAGNOSIS — C9001 Multiple myeloma in remission: Secondary | ICD-10-CM

## 2020-11-05 MED ORDER — LENALIDOMIDE 15 MG PO CAPS: ORAL_CAPSULE | ORAL | 0 refills | Status: DC

## 2020-11-19 ENCOUNTER — Inpatient Hospital Stay: Payer: Medicare HMO

## 2020-11-19 ENCOUNTER — Inpatient Hospital Stay: Payer: Medicare HMO | Attending: Internal Medicine

## 2020-11-19 ENCOUNTER — Other Ambulatory Visit: Payer: Self-pay

## 2020-11-19 ENCOUNTER — Inpatient Hospital Stay (HOSPITAL_BASED_OUTPATIENT_CLINIC_OR_DEPARTMENT_OTHER): Payer: Medicare HMO | Admitting: Internal Medicine

## 2020-11-19 DIAGNOSIS — C9001 Multiple myeloma in remission: Secondary | ICD-10-CM | POA: Diagnosis present

## 2020-11-19 LAB — COMPREHENSIVE METABOLIC PANEL
ALT: 21 U/L (ref 0–44)
AST: 20 U/L (ref 15–41)
Albumin: 3.6 g/dL (ref 3.5–5.0)
Alkaline Phosphatase: 59 U/L (ref 38–126)
Anion gap: 5 (ref 5–15)
BUN: 18 mg/dL (ref 8–23)
CO2: 25 mmol/L (ref 22–32)
Calcium: 8.9 mg/dL (ref 8.9–10.3)
Chloride: 105 mmol/L (ref 98–111)
Creatinine, Ser: 1.5 mg/dL — ABNORMAL HIGH (ref 0.61–1.24)
GFR, Estimated: 48 mL/min — ABNORMAL LOW (ref >60)
Glucose, Bld: 123 mg/dL — ABNORMAL HIGH (ref 70–99)
Potassium: 4.1 mmol/L (ref 3.5–5.1)
Sodium: 135 mmol/L (ref 135–145)
Total Bilirubin: 0.4 mg/dL (ref 0.3–1.2)
Total Protein: 7.5 g/dL (ref 6.5–8.1)

## 2020-11-19 LAB — CBC WITH DIFFERENTIAL/PLATELET
Abs Immature Granulocytes: 0.02 10*3/uL (ref 0.00–0.07)
Basophils Absolute: 0 10*3/uL (ref 0.0–0.1)
Basophils Relative: 1 %
Eosinophils Absolute: 0.1 10*3/uL (ref 0.0–0.5)
Eosinophils Relative: 4 %
HCT: 39.1 % (ref 39.0–52.0)
Hemoglobin: 12.6 g/dL — ABNORMAL LOW (ref 13.0–17.0)
Immature Granulocytes: 1 %
Lymphocytes Relative: 21 %
Lymphs Abs: 0.6 10*3/uL — ABNORMAL LOW (ref 0.7–4.0)
MCH: 30.9 pg (ref 26.0–34.0)
MCHC: 32.2 g/dL (ref 30.0–36.0)
MCV: 95.8 fL (ref 80.0–100.0)
Monocytes Absolute: 0.3 10*3/uL (ref 0.1–1.0)
Monocytes Relative: 9 %
Neutro Abs: 1.9 10*3/uL (ref 1.7–7.7)
Neutrophils Relative %: 64 %
Platelets: 290 10*3/uL (ref 150–400)
RBC: 4.08 MIL/uL — ABNORMAL LOW (ref 4.22–5.81)
RDW: 15.4 % (ref 11.5–15.5)
WBC: 3 10*3/uL — ABNORMAL LOW (ref 4.0–10.5)
nRBC: 0 % (ref 0.0–0.2)

## 2020-11-19 MED ORDER — ZOLEDRONIC ACID 4 MG/5ML IV CONC
3.0000 mg | Freq: Once | INTRAVENOUS | Status: AC
  Administered 2020-11-19: 3 mg via INTRAVENOUS
  Filled 2020-11-19: qty 3.75

## 2020-11-19 MED ORDER — SODIUM CHLORIDE 0.9 % IV SOLN
Freq: Once | INTRAVENOUS | Status: AC
  Filled 2020-11-19: qty 250

## 2020-11-19 NOTE — Patient Instructions (Signed)

## 2020-11-19 NOTE — Assessment & Plan Note (Addendum)
#  Multiple myeloma-most recently on maintenance Revlimid 15 mg 2  weeks on 2 week off.  AUG 2022 M-protine-NEG; kappa lambda light chain ratio=1 85. await MM labs from today. STABLE.  # On Revlimid 15 mg 2 weeks; 2 weeks OFF; Today ANC 1.9- hemoglobin-12/platelets normal.  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 stable.   #Bone modifying agent/multiple myeloma-- on zometa 3 mg every other month; ca- 8.9 -Proceed with zometa today.  Recommended continued compliance with ca+vit D  Disposition: # today ZOMETA # 1 month- cbc/cmp;  # 2 months-cbc/cmp # Follow up in 3 months -  MD;  labs [CBC, CMP, kappa lambda light chains, multiple myeloma panel]  and zometa- Dr.B

## 2020-11-19 NOTE — Progress Notes (Signed)
[place] OFFICE PROGRESS NOTE  Patient Care Team: Baxter Hire, MD as PCP - General (Internal Medicine) Druscilla Brownie, MD as Consulting Physician (Dermatology) Cammie Sickle, MD as Consulting Physician (Hematology and Oncology)  Cancer Staging No matching staging information was found for the patient.   Oncology History Overview Note   # April 2014- MULTIPLE MYELOMA [IgG- 2.2gm/dl; 40-50% plasma cell; Cyto-N; FISH- gain of chr.9, 15 & ccnd1/11q13] s/p RVD x6 [sep 2014; BMBx- ~5% plasma cells]; Maint Rev-DEX-Zometa; March 2015-Stopped Dex Cont- Rev-Zometa; July 2016- Rev 2m; NOV 4th  2016-HOLD; [facility] declined BMT.   # Re-START FEB 2017 Rev 149m3 W- on & 1 w OFF; HELD Rev June- Oct 2020- sec to worsening renal function [Dr.Singh]; SEP 2021- Rev 15 mg 2 w-On & 2 w-OFF.   # Zometa  # CKD [creat ~1.4; sec MM; Dr.Singh]; DM  -------------------------------------------------------------------------   DIAGNOSIS: [2[name] MULTIPLE MYELOMA  GOALS: control  CURRENT/MOST RECENT THERAPY- REVLIMID Maintenance [Feb 2017]    Multiple [facility]oma in remission ([facility] 10/01/2015 Initial Diagnosis   Multiple myeloma in remission (HCF[facility]    INTERVAL HISTORY:  [name].o.  male pleasant patient above history of multiple myeloma  on Revlimid maintenance [2 weeks on 2-week off] is here for follow-up.  No fever no chills.  No nausea no vomiting. Patient denies any shortness of breath or cough.  Denies any swelling of the legs.  No worsening back pain.   Review of Systems  Constitutional:  Negative for chills, diaphoresis, fever, malaise/fatigue and weight loss.  HENT:  Negative for nosebleeds and sore throat.   Eyes:  Negative for double vision.  Respiratory:  Negative for cough, hemoptysis, sputum production, shortness of breath and wheezing.   Cardiovascular:  Positive for leg swelling. Negative for chest pain, palpitations and  orthopnea.  Gastrointestinal:  Negative for abdominal pain, blood in stool, constipation, diarrhea, heartburn, melena, nausea and vomiting.  Genitourinary:  Negative for dysuria, frequency and urgency.  Musculoskeletal:  Negative for back pain and joint pain.  Skin: Negative.  Negative for itching and rash.  Neurological:  Negative for dizziness, tingling, focal weakness, weakness and headaches.  Endo/Heme/Allergies:  Does not bruise/bleed easily.  Psychiatric/Behavioral:  Negative for depression. The patient is not nervous/anxious and does not have insomnia.      PAST MEDICAL HISTORY :  Past Medical History:  Diagnosis Date   Diabetes mellitus without complication (H[facility])    GERD (gastroesophageal reflux disease)    Hyperlipidemia    Hypertension    Multiple myeloma (HCMead   Obesity    Thyroid cancer (HCPort[facility]   Thyroid disease     PAST SURGICAL HISTORY :   Past Surgical History:  Procedure Laterality Date   BONE MARROW BIOPSY  2014   CATARACT EXTRACTION BILATERAL W/ ANTERIOR VITRECTOMY      FAMILY HISTORY :   Family History  Problem Relation Age of Onset   Cancer Father 6329 Diabetes Mother     SOCIAL HISTORY:   Social History   Tobacco Use   Smoking status: Never   Smokeless tobacco: Never  Substance Use Topics   Alcohol use: No   Drug use: No    ALLERGIES:  has No Known Allergies.  MEDICATIONS:  Current Outpatient Medications  Medication Sig Dispense Refill   amLODipine (NORVASC) 10 MG tablet Take 10 mg by mouth daily.     aspirin EC 81 MG tablet  Take 81 mg by mouth daily.     atorvastatin (LIPITOR) 20 MG tablet Take 20 mg by mouth daily.     cloNIDine (CATAPRES) 0.1 MG tablet Take 0.1 mg by mouth 2 (two) times daily.     Cyanocobalamin 1000 MCG TBCR Take by mouth.     famotidine (PEPCID) 20 MG tablet Take 1 tablet (20 mg total) by mouth 2 (two) times daily. 14 tablet 0   lenalidomide (REVLIMID) 15 MG capsule Take 1 capsule (15 mg total) by mouth daily for 2  weeks, then hold for 2 weeks off 14 capsule 0   levothyroxine (SYNTHROID) 137 MCG tablet Take 1 tablet by mouth daily.      ONETOUCH DELICA LANCETS 61Y MISC Inject 1 Lancet as directed once daily.     pioglitazone (ACTOS) 30 MG tablet Take 30 mg by mouth daily.     potassium chloride SA (K-DUR,KLOR-CON) 20 MEQ tablet Take 20 mEq by mouth 2 (two) times daily.   0   quinapril (ACCUPRIL) 40 MG tablet Take 40 mg by mouth daily.     torsemide (DEMADEX) 20 MG tablet Take 20 mg by mouth daily as needed.     No current facility-administered medications for this visit.   Facility-Administered Medications Ordered in Other Visits  Medication Dose Route Frequency Provider Last Rate Last Admin   0.9 %  sodium chloride infusion   Intravenous Continuous Cammie Sickle, MD   Stopped at 12/11/14 1528    PHYSICAL EXAMINATION: ECOG PERFORMANCE STATUS: 0 - Asymptomatic  BP 140/75   Pulse 68   Temp (!) 97.5 F (36.4 C) (Oral)   Resp 20   Wt 255 lb 9.6 oz (115.9 kg)   SpO2 99%   BMI 36.67 kg/m   Filed Weights   11/19/20 1310  Weight: 255 lb 9.6 oz (115.9 kg)    Physical Exam Vitals and nursing note reviewed.  HENT:     Head: Normocephalic and atraumatic.     Mouth/Throat:     Pharynx: Oropharynx is clear. No oropharyngeal exudate.  Eyes:     Extraocular Movements: Extraocular movements intact.     Pupils: Pupils are equal, round, and reactive to light.  Cardiovascular:     Rate and Rhythm: Normal rate and regular rhythm.  Pulmonary:     Effort: No respiratory distress.     Breath sounds: No wheezing.     Comments: Decreased breath sounds bilaterally.  Abdominal:     General: Bowel sounds are normal. There is no distension.     Palpations: Abdomen is soft. There is no mass.     Tenderness: There is no abdominal tenderness. There is no guarding or rebound.  Musculoskeletal:        General: No tenderness. Normal range of motion.     Cervical back: Normal range of motion and neck  supple.  Skin:    General: Skin is warm.  Neurological:     General: No focal deficit present.     Mental Status: He is alert and oriented to person, place, and time.  Psychiatric:        Mood and Affect: Affect normal.        Behavior: Behavior normal.        Judgment: Judgment normal.      LABORATORY DATA:  I have reviewed the data as listed    Component Value Date/Time   NA 135 11/19/2020 1256   NA 139 05/01/2014 1322   K 4.1 11/19/2020 1256  K 4.4 05/01/2014 1322   CL 105 11/19/2020 1256   CL 107 05/01/2014 1322   CO2 25 11/19/2020 1256   CO2 27 05/01/2014 1322   GLUCOSE 123 (H) 11/19/2020 1256   GLUCOSE 102 (H) 05/01/2014 1322   BUN 18 11/19/2020 1256   BUN 16 05/01/2014 1322   CREATININE 1.50 (H) 11/19/2020 1256   CREATININE 1.48 (H) 05/29/2014 0949   CALCIUM 8.9 11/19/2020 1256   CALCIUM 9.1 05/01/2014 1322   PROT 7.5 11/19/2020 1256   PROT 7.5 05/01/2014 1322   ALBUMIN 3.6 11/19/2020 1256   ALBUMIN 3.6 05/01/2014 1322   AST 20 11/19/2020 1256   AST 17 05/01/2014 1322   ALT 21 11/19/2020 1256   ALT 13 (L) 05/01/2014 1322   ALKPHOS 59 11/19/2020 1256   ALKPHOS 47 05/01/2014 1322   BILITOT 0.4 11/19/2020 1256   BILITOT 0.6 05/01/2014 1322   GFRNONAA 48 (L) 11/19/2020 1256   GFRNONAA 47 (L) 05/29/2014 0949   GFRAA 47 (L) 10/21/2019 1106   GFRAA 55 (L) 05/29/2014 0949    No results found for: SPEP, UPEP  Lab Results  Component Value Date   WBC 3.0 (L) 11/19/2020   NEUTROABS 1.9 11/19/2020   HGB 12.6 (L) 11/19/2020   HCT 39.1 11/19/2020   MCV 95.8 11/19/2020   PLT 290 11/19/2020      Chemistry      Component Value Date/Time   NA 135 11/19/2020 1256   NA 139 05/01/2014 1322   K 4.1 11/19/2020 1256   K 4.4 05/01/2014 1322   CL 105 11/19/2020 1256   CL 107 05/01/2014 1322   CO2 25 11/19/2020 1256   CO2 27 05/01/2014 1322   BUN 18 11/19/2020 1256   BUN 16 05/01/2014 1322   CREATININE 1.50 (H) 11/19/2020 1256   CREATININE 1.48 (H) 05/29/2014  0949      Component Value Date/Time   CALCIUM 8.9 11/19/2020 1256   CALCIUM 9.1 05/01/2014 1322   ALKPHOS 59 11/19/2020 1256   ALKPHOS 47 05/01/2014 1322   AST 20 11/19/2020 1256   AST 17 05/01/2014 1322   ALT 21 11/19/2020 1256   ALT 13 (L) 05/01/2014 1322   BILITOT 0.4 11/19/2020 1256   BILITOT 0.6 05/01/2014 1322       RADIOGRAPHIC STUDIES: I have personally reviewed the radiological images as listed and agreed with the findings in the report. No results found.   ASSESSMENT & PLAN:  Multiple myeloma in remission ([place]) #Multiple myeloma-most recently on maintenance Revlimid 15 mg 2  weeks on 2 week off.  AUG 2022 M-protine-NEG; kappa lambda light chain ratio=1 85. await MM labs from today. STABLE.  # On Revlimid 15 mg 2 weeks; 2 weeks OFF; Today ANC 1.9- hemoglobin-12/platelets normal.  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 stable.   #Bone modifying agent/multiple myeloma-- on zometa 3 mg every other month; ca- 8.9 -Proceed with zometa today.  Recommended continued compliance with ca+vit D  Disposition: # today ZOMETA # 1 month- cbc/cmp;  # 2 months-cbc/cmp # Follow up in 3 months -  MD;  labs [CBC, CMP, kappa lambda light chains, multiple myeloma panel]  and zometa- Dr.B   Orders Placed This Encounter  Procedures   Multiple Myeloma Panel (SPEP&IFE w/QIG)    Standing Status:   Standing    Number of Occurrences:   6    Standing Expiration Date:   11/19/2021   Kappa/lambda light chains    Standing Status:  Standing    Number of Occurrences:   6    Standing Expiration Date:   11/19/2021   CBC with Differential    Standing Status:   Standing    Number of Occurrences:   12    Standing Expiration Date:   11/19/2021   Comprehensive metabolic panel    Standing Status:   Standing    Number of Occurrences:   12    Standing Expiration Date:   11/19/2021    All questions were answered. The patient knows to call the clinic with any problems,  questions or concerns.      Cammie Sickle, MD 11/19/2020 3:59 PM

## 2020-11-19 NOTE — Progress Notes (Signed)
Patient here for pre treatment check. VSS and WNL he has no concerns today.

## 2020-11-22 LAB — KAPPA/LAMBDA LIGHT CHAINS
Kappa free light chain: 75.2 mg/L — ABNORMAL HIGH (ref 3.3–19.4)
Kappa, lambda light chain ratio: 2.35 — ABNORMAL HIGH (ref 0.26–1.65)
Lambda free light chains: 32 mg/L — ABNORMAL HIGH (ref 5.7–26.3)

## 2020-11-23 LAB — MULTIPLE MYELOMA PANEL, SERUM
Albumin SerPl Elph-Mcnc: 3.4 g/dL (ref 2.9–4.4)
Albumin/Glob SerPl: 1 (ref 0.7–1.7)
Alpha 1: 0.2 g/dL (ref 0.0–0.4)
Alpha2 Glob SerPl Elph-Mcnc: 0.7 g/dL (ref 0.4–1.0)
B-Globulin SerPl Elph-Mcnc: 1.3 g/dL (ref 0.7–1.3)
Gamma Glob SerPl Elph-Mcnc: 1.3 g/dL (ref 0.4–1.8)
Globulin, Total: 3.5 g/dL (ref 2.2–3.9)
IgA: 583 mg/dL — ABNORMAL HIGH (ref 61–437)
IgG (Immunoglobin G), Serum: 1412 mg/dL (ref 603–1613)
IgM (Immunoglobulin M), Srm: 32 mg/dL (ref 15–143)
Total Protein ELP: 6.9 g/dL (ref 6.0–8.5)

## 2020-12-03 ENCOUNTER — Other Ambulatory Visit: Payer: Self-pay | Admitting: *Deleted

## 2020-12-03 DIAGNOSIS — C9001 Multiple myeloma in remission: Secondary | ICD-10-CM

## 2020-12-06 ENCOUNTER — Encounter: Payer: Self-pay | Admitting: Internal Medicine

## 2020-12-06 MED ORDER — LENALIDOMIDE 15 MG PO CAPS: ORAL_CAPSULE | ORAL | 0 refills | Status: DC

## 2020-12-17 ENCOUNTER — Inpatient Hospital Stay: Payer: Medicare HMO | Attending: Internal Medicine

## 2020-12-17 ENCOUNTER — Other Ambulatory Visit: Payer: Self-pay

## 2020-12-17 DIAGNOSIS — C9001 Multiple myeloma in remission: Secondary | ICD-10-CM | POA: Diagnosis not present

## 2020-12-17 LAB — COMPREHENSIVE METABOLIC PANEL
ALT: 14 U/L (ref 0–44)
AST: 16 U/L (ref 15–41)
Albumin: 3.7 g/dL (ref 3.5–5.0)
Alkaline Phosphatase: 54 U/L (ref 38–126)
Anion gap: 6 (ref 5–15)
BUN: 20 mg/dL (ref 8–23)
CO2: 25 mmol/L (ref 22–32)
Calcium: 8.7 mg/dL — ABNORMAL LOW (ref 8.9–10.3)
Chloride: 107 mmol/L (ref 98–111)
Creatinine, Ser: 1.62 mg/dL — ABNORMAL HIGH (ref 0.61–1.24)
GFR, Estimated: 44 mL/min — ABNORMAL LOW (ref >60)
Glucose, Bld: 126 mg/dL — ABNORMAL HIGH (ref 70–99)
Potassium: 4.9 mmol/L (ref 3.5–5.1)
Sodium: 138 mmol/L (ref 135–145)
Total Bilirubin: 0.6 mg/dL (ref 0.3–1.2)
Total Protein: 7.6 g/dL (ref 6.5–8.1)

## 2020-12-17 LAB — CBC WITH DIFFERENTIAL/PLATELET
Abs Immature Granulocytes: 0.02 10*3/uL (ref 0.00–0.07)
Basophils Absolute: 0 10*3/uL (ref 0.0–0.1)
Basophils Relative: 1 %
Eosinophils Absolute: 0.1 10*3/uL (ref 0.0–0.5)
Eosinophils Relative: 4 %
HCT: 38.8 % — ABNORMAL LOW (ref 39.0–52.0)
Hemoglobin: 12.4 g/dL — ABNORMAL LOW (ref 13.0–17.0)
Immature Granulocytes: 1 %
Lymphocytes Relative: 19 %
Lymphs Abs: 0.6 10*3/uL — ABNORMAL LOW (ref 0.7–4.0)
MCH: 30.2 pg (ref 26.0–34.0)
MCHC: 32 g/dL (ref 30.0–36.0)
MCV: 94.6 fL (ref 80.0–100.0)
Monocytes Absolute: 0.3 10*3/uL (ref 0.1–1.0)
Monocytes Relative: 8 %
Neutro Abs: 2.3 10*3/uL (ref 1.7–7.7)
Neutrophils Relative %: 67 %
Platelets: 297 10*3/uL (ref 150–400)
RBC: 4.1 MIL/uL — ABNORMAL LOW (ref 4.22–5.81)
RDW: 15.7 % — ABNORMAL HIGH (ref 11.5–15.5)
WBC: 3.4 10*3/uL — ABNORMAL LOW (ref 4.0–10.5)
nRBC: 0 % (ref 0.0–0.2)

## 2021-01-03 ENCOUNTER — Other Ambulatory Visit: Payer: Self-pay | Admitting: Pharmacist

## 2021-01-03 DIAGNOSIS — C9001 Multiple myeloma in remission: Secondary | ICD-10-CM

## 2021-01-03 MED ORDER — LENALIDOMIDE 15 MG PO CAPS
15.0000 mg | ORAL_CAPSULE | Freq: Every day | ORAL | 0 refills | Status: DC
Start: 2021-01-03 — End: 2021-02-28

## 2021-01-14 ENCOUNTER — Other Ambulatory Visit: Payer: Self-pay

## 2021-01-14 ENCOUNTER — Inpatient Hospital Stay: Payer: Medicare HMO | Attending: Internal Medicine

## 2021-01-14 DIAGNOSIS — C9001 Multiple myeloma in remission: Secondary | ICD-10-CM | POA: Diagnosis present

## 2021-01-14 LAB — CBC WITH DIFFERENTIAL/PLATELET
Abs Immature Granulocytes: 0.04 10*3/uL (ref 0.00–0.07)
Basophils Absolute: 0 10*3/uL (ref 0.0–0.1)
Basophils Relative: 1 %
Eosinophils Absolute: 0.2 10*3/uL (ref 0.0–0.5)
Eosinophils Relative: 5 %
HCT: 38.2 % — ABNORMAL LOW (ref 39.0–52.0)
Hemoglobin: 12.5 g/dL — ABNORMAL LOW (ref 13.0–17.0)
Immature Granulocytes: 1 %
Lymphocytes Relative: 21 %
Lymphs Abs: 0.7 10*3/uL (ref 0.7–4.0)
MCH: 30.8 pg (ref 26.0–34.0)
MCHC: 32.7 g/dL (ref 30.0–36.0)
MCV: 94.1 fL (ref 80.0–100.0)
Monocytes Absolute: 0.3 10*3/uL (ref 0.1–1.0)
Monocytes Relative: 10 %
Neutro Abs: 2.2 10*3/uL (ref 1.7–7.7)
Neutrophils Relative %: 62 %
Platelets: 268 10*3/uL (ref 150–400)
RBC: 4.06 MIL/uL — ABNORMAL LOW (ref 4.22–5.81)
RDW: 15.2 % (ref 11.5–15.5)
WBC: 3.5 10*3/uL — ABNORMAL LOW (ref 4.0–10.5)
nRBC: 0 % (ref 0.0–0.2)

## 2021-01-14 LAB — COMPREHENSIVE METABOLIC PANEL
ALT: 16 U/L (ref 0–44)
AST: 19 U/L (ref 15–41)
Albumin: 3.6 g/dL (ref 3.5–5.0)
Alkaline Phosphatase: 53 U/L (ref 38–126)
Anion gap: 7 (ref 5–15)
BUN: 15 mg/dL (ref 8–23)
CO2: 26 mmol/L (ref 22–32)
Calcium: 9.3 mg/dL (ref 8.9–10.3)
Chloride: 103 mmol/L (ref 98–111)
Creatinine, Ser: 1.56 mg/dL — ABNORMAL HIGH (ref 0.61–1.24)
GFR, Estimated: 46 mL/min — ABNORMAL LOW (ref >60)
Glucose, Bld: 103 mg/dL — ABNORMAL HIGH (ref 70–99)
Potassium: 4.5 mmol/L (ref 3.5–5.1)
Sodium: 136 mmol/L (ref 135–145)
Total Bilirubin: 0.6 mg/dL (ref 0.3–1.2)
Total Protein: 7.2 g/dL (ref 6.5–8.1)

## 2021-02-11 ENCOUNTER — Inpatient Hospital Stay: Payer: Medicare HMO | Admitting: Internal Medicine

## 2021-02-11 ENCOUNTER — Inpatient Hospital Stay: Payer: Medicare HMO | Attending: Internal Medicine

## 2021-02-11 ENCOUNTER — Inpatient Hospital Stay: Payer: Medicare HMO

## 2021-02-11 DIAGNOSIS — C9001 Multiple myeloma in remission: Secondary | ICD-10-CM | POA: Insufficient documentation

## 2021-02-11 NOTE — Assessment & Plan Note (Deleted)
#  Multiple myeloma-most recently on maintenance Revlimid 15 mg 2  weeks on 2 week off.  AUG 2022 M-protine-NEG; kappa lambda light chain ratio=1 85. await MM labs from today. STABLE.  # On Revlimid 15 mg 2 weeks; 2 weeks OFF; Today ANC 1.9- hemoglobin-12/platelets normal.  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 stable.   #Bone modifying agent/multiple myeloma-- on zometa 3 mg every other month; ca- 8.9 -Proceed with zometa today.  Recommended continued compliance with ca+vit D  Disposition: # today ZOMETA # 1 month- cbc/cmp;  # 2 months-cbc/cmp # Follow up in 3 months -  MD;  labs [CBC, CMP, kappa lambda light chains, multiple myeloma panel]  and zometa- Dr.B 

## 2021-02-11 NOTE — Progress Notes (Deleted)
[place] OFFICE PROGRESS NOTE  Patient Care Team: Baxter Hire, MD as PCP - General (Internal Medicine) Druscilla Brownie, MD as Consulting Physician (Dermatology) Cammie Sickle, MD as Consulting Physician (Hematology and Oncology)   Cancer Staging  No matching staging information was found for the patient.   Oncology History Overview Note   # April 2014- MULTIPLE MYELOMA [IgG- 2.2gm/dl; 40-50% plasma cell; Cyto-N; FISH- gain of chr.9, 15 & ccnd1/11q13] s/p RVD x6 [sep 2014; BMBx- ~5% plasma cells]; Maint Rev-DEX-Zometa; March 2015-Stopped Dex Cont- Rev-Zometa; July 2016- Rev 55m; NOV 4th  2016-HOLD; [facility] declined BMT.   # Re-START FEB 2017 Rev 172m3 W- on & 1 w OFF; HELD Rev June- Oct 2020- sec to worsening renal function [Dr.Singh]; SEP 2021- Rev 15 mg 2 w-On & 2 w-OFF.   # Zometa  # CKD [creat ~1.4; sec MM; Dr.Singh]; DM  -------------------------------------------------------------------------   DIAGNOSIS: [2Darrin.Alexander MULTIPLE MYELOMA  GOALS: control  CURRENT/MOST RECENT THERAPY- REVLIMID Maintenance [Feb 2017]    Multiple [place] in remission ([facility] 10/01/2015 Initial Diagnosis   Multiple myeloma in remission ([facility]    INTERVAL HISTORY:  [name]89.o.  male pleasant patient above history of multiple myeloma  on Revlimid maintenance [2 weeks on 2-week off] is here for follow-up.  No fever no chills.  No nausea no vomiting. Patient denies any shortness of breath or cough.  Denies any swelling of the legs.  No worsening back pain.   Review of Systems  Constitutional:  Negative for chills, diaphoresis, fever, malaise/fatigue and weight loss.  HENT:  Negative for nosebleeds and sore throat.   Eyes:  Negative for double vision.  Respiratory:  Negative for cough, hemoptysis, sputum production, shortness of breath and wheezing.   Cardiovascular:  Positive for leg swelling. Negative for chest pain, palpitations and  orthopnea.  Gastrointestinal:  Negative for abdominal pain, blood in stool, constipation, diarrhea, heartburn, melena, nausea and vomiting.  Genitourinary:  Negative for dysuria, frequency and urgency.  Musculoskeletal:  Negative for back pain and joint pain.  Skin: Negative.  Negative for itching and rash.  Neurological:  Negative for dizziness, tingling, focal weakness, weakness and headaches.  Endo/Heme/Allergies:  Does not bruise/bleed easily.  Psychiatric/Behavioral:  Negative for depression. The patient is not nervous/anxious and does not have insomnia.      PAST MEDICAL HISTORY :  Past Medical History:  Diagnosis Date   Diabetes mellitus without complication (HCC)    GERD (gastroesophageal reflux disease)    Hyperlipidemia    Hypertension    Multiple myeloma ([facility]   Obesity    Thyroid cancer ([facility]   Thyroid disease     PAST SURGICAL HISTORY :   Past Surgical History:  Procedure Laterality Date   BONE MARROW BIOPSY  2014   CATARACT EXTRACTION BILATERAL W/ ANTERIOR VITRECTOMY      FAMILY HISTORY :   Family History  Problem Relation Age of Onset   Cancer Father 6379 Diabetes Mother     SOCIAL HISTORY:   Social History   Tobacco Use   Smoking status: Never   Smokeless tobacco: Never  Substance Use Topics   Alcohol use: No   Drug use: No    ALLERGIES:  has No Known Allergies.  MEDICATIONS:  Current Outpatient Medications  Medication Sig Dispense Refill   amLODipine (NORVASC) 10 MG tablet Take 10 mg by mouth daily.     aspirin EC 81  MG tablet Take 81 mg by mouth daily.     atorvastatin (LIPITOR) 20 MG tablet Take 20 mg by mouth daily.     cloNIDine (CATAPRES) 0.1 MG tablet Take 0.1 mg by mouth 2 (two) times daily.     Cyanocobalamin 1000 MCG TBCR Take by mouth.     famotidine (PEPCID) 20 MG tablet Take 1 tablet (20 mg total) by mouth 2 (two) times daily. 14 tablet 0   lenalidomide (REVLIMID) 15 MG capsule Take 1 capsule (15 mg  total) by mouth daily. Take for 14 days, then hold for 14 days. Repeat every 28 days. 14 capsule 0   levothyroxine (SYNTHROID) 137 MCG tablet Take 1 tablet by mouth daily.      ONETOUCH DELICA LANCETS 41O MISC Inject 1 Lancet as directed once daily.     pioglitazone (ACTOS) 30 MG tablet Take 30 mg by mouth daily.     potassium chloride SA (K-DUR,KLOR-CON) 20 MEQ tablet Take 20 mEq by mouth 2 (two) times daily.   0   quinapril (ACCUPRIL) 40 MG tablet Take 40 mg by mouth daily.     torsemide (DEMADEX) 20 MG tablet Take 20 mg by mouth daily as needed.     No current facility-administered medications for this visit.   Facility-Administered Medications Ordered in Other Visits  Medication Dose Route Frequency Provider Last Rate Last Admin   0.9 %  sodium chloride infusion   Intravenous Continuous Cammie Sickle, MD   Stopped at 12/11/14 1528    PHYSICAL EXAMINATION: ECOG PERFORMANCE STATUS: 0 - Asymptomatic  There were no vitals taken for this visit.  There were no vitals filed for this visit.   Physical Exam Vitals and nursing note reviewed.  HENT:     Head: Normocephalic and atraumatic.     Mouth/Throat:     Pharynx: Oropharynx is clear. No oropharyngeal exudate.  Eyes:     Extraocular Movements: Extraocular movements intact.     Pupils: Pupils are equal, round, and reactive to light.  Cardiovascular:     Rate and Rhythm: Normal rate and regular rhythm.  Pulmonary:     Effort: No respiratory distress.     Breath sounds: No wheezing.     Comments: Decreased breath sounds bilaterally.  Abdominal:     General: Bowel sounds are normal. There is no distension.     Palpations: Abdomen is soft. There is no mass.     Tenderness: There is no abdominal tenderness. There is no guarding or rebound.  Musculoskeletal:        General: No tenderness. Normal range of motion.     Cervical back: Normal range of motion and neck supple.  Skin:    General: Skin is warm.   Neurological:     General: No focal deficit present.     Mental Status: He is alert and oriented to person, place, and time.  Psychiatric:        Mood and Affect: Affect normal.        Behavior: Behavior normal.        Judgment: Judgment normal.      LABORATORY DATA:  I have reviewed the data as listed    Component Value Date/Time   NA 136 01/14/2021 1110   NA 139 05/01/2014 1322   K 4.5 01/14/2021 1110   K 4.4 05/01/2014 1322   CL 103 01/14/2021 1110   CL 107 05/01/2014 1322   CO2 26 01/14/2021 1110   CO2 27 05/01/2014 1322  GLUCOSE 103 (H) 01/14/2021 1110   GLUCOSE 102 (H) 05/01/2014 1322   BUN 15 01/14/2021 1110   BUN 16 05/01/2014 1322   CREATININE 1.56 (H) 01/14/2021 1110   CREATININE 1.48 (H) 05/29/2014 0949   CALCIUM 9.3 01/14/2021 1110   CALCIUM 9.1 05/01/2014 1322   PROT 7.2 01/14/2021 1110   PROT 7.5 05/01/2014 1322   ALBUMIN 3.6 01/14/2021 1110   ALBUMIN 3.6 05/01/2014 1322   AST 19 01/14/2021 1110   AST 17 05/01/2014 1322   ALT 16 01/14/2021 1110   ALT 13 (L) 05/01/2014 1322   ALKPHOS 53 01/14/2021 1110   ALKPHOS 47 05/01/2014 1322   BILITOT 0.6 01/14/2021 1110   BILITOT 0.6 05/01/2014 1322   GFRNONAA 46 (L) 01/14/2021 1110   GFRNONAA 47 (L) 05/29/2014 0949   GFRAA 47 (L) 10/21/2019 1106   GFRAA 55 (L) 05/29/2014 0949    No results found for: SPEP, UPEP  Lab Results  Component Value Date   WBC 3.5 (L) 01/14/2021   NEUTROABS 2.2 01/14/2021   HGB 12.5 (L) 01/14/2021   HCT 38.2 (L) 01/14/2021   MCV 94.1 01/14/2021   PLT 268 01/14/2021      Chemistry      Component Value Date/Time   NA 136 01/14/2021 1110   NA 139 05/01/2014 1322   K 4.5 01/14/2021 1110   K 4.4 05/01/2014 1322   CL 103 01/14/2021 1110   CL 107 05/01/2014 1322   CO2 26 01/14/2021 1110   CO2 27 05/01/2014 1322   BUN 15 01/14/2021 1110   BUN 16 05/01/2014 1322   CREATININE 1.56 (H) 01/14/2021 1110   CREATININE 1.48 (H) 05/29/2014 0949      Component Value Date/Time    CALCIUM 9.3 01/14/2021 1110   CALCIUM 9.1 05/01/2014 1322   ALKPHOS 53 01/14/2021 1110   ALKPHOS 47 05/01/2014 1322   AST 19 01/14/2021 1110   AST 17 05/01/2014 1322   ALT 16 01/14/2021 1110   ALT 13 (L) 05/01/2014 1322   BILITOT 0.6 01/14/2021 1110   BILITOT 0.6 05/01/2014 1322       RADIOGRAPHIC STUDIES: I have personally reviewed the radiological images as listed and agreed with the findings in the report. No results found.   ASSESSMENT & PLAN:  No problem-specific Assessment & Plan notes found for this encounter.   No orders of the defined types were placed in this encounter.   All questions were answered. The patient knows to call the clinic with any problems, questions or concerns.      Cammie Sickle, MD 02/11/2021 1:43 PM

## 2021-02-14 ENCOUNTER — Telehealth: Payer: Self-pay | Admitting: Internal Medicine

## 2021-02-14 NOTE — Telephone Encounter (Signed)
Pt called to reschedule his appt for 1-6. He missed. Call back at [phone]

## 2021-02-14 NOTE — Telephone Encounter (Signed)
Done pt is aware

## 2021-02-28 ENCOUNTER — Inpatient Hospital Stay: Payer: Medicare HMO

## 2021-02-28 ENCOUNTER — Other Ambulatory Visit: Payer: Self-pay

## 2021-02-28 ENCOUNTER — Inpatient Hospital Stay (HOSPITAL_BASED_OUTPATIENT_CLINIC_OR_DEPARTMENT_OTHER): Payer: Medicare HMO | Admitting: Internal Medicine

## 2021-02-28 ENCOUNTER — Encounter: Payer: Self-pay | Admitting: Internal Medicine

## 2021-02-28 DIAGNOSIS — C9001 Multiple myeloma in remission: Secondary | ICD-10-CM

## 2021-02-28 LAB — CBC WITH DIFFERENTIAL/PLATELET
Abs Immature Granulocytes: 0.01 10*3/uL (ref 0.00–0.07)
Basophils Absolute: 0 10*3/uL (ref 0.0–0.1)
Basophils Relative: 1 %
Eosinophils Absolute: 0.2 10*3/uL (ref 0.0–0.5)
Eosinophils Relative: 7 %
HCT: 41.3 % (ref 39.0–52.0)
Hemoglobin: 13.3 g/dL (ref 13.0–17.0)
Immature Granulocytes: 0 %
Lymphocytes Relative: 24 %
Lymphs Abs: 0.8 10*3/uL (ref 0.7–4.0)
MCH: 30.4 pg (ref 26.0–34.0)
MCHC: 32.2 g/dL (ref 30.0–36.0)
MCV: 94.3 fL (ref 80.0–100.0)
Monocytes Absolute: 0.6 10*3/uL (ref 0.1–1.0)
Monocytes Relative: 16 %
Neutro Abs: 1.9 10*3/uL (ref 1.7–7.7)
Neutrophils Relative %: 52 %
Platelets: 153 10*3/uL (ref 150–400)
RBC: 4.38 MIL/uL (ref 4.22–5.81)
RDW: 15.3 % (ref 11.5–15.5)
WBC: 3.5 10*3/uL — ABNORMAL LOW (ref 4.0–10.5)
nRBC: 0 % (ref 0.0–0.2)

## 2021-02-28 LAB — COMPREHENSIVE METABOLIC PANEL
ALT: 18 U/L (ref 0–44)
AST: 19 U/L (ref 15–41)
Albumin: 3.8 g/dL (ref 3.5–5.0)
Alkaline Phosphatase: 57 U/L (ref 38–126)
Anion gap: 8 (ref 5–15)
BUN: 17 mg/dL (ref 8–23)
CO2: 23 mmol/L (ref 22–32)
Calcium: 9.1 mg/dL (ref 8.9–10.3)
Chloride: 108 mmol/L (ref 98–111)
Creatinine, Ser: 1.37 mg/dL — ABNORMAL HIGH (ref 0.61–1.24)
GFR, Estimated: 53 mL/min — ABNORMAL LOW (ref >60)
Glucose, Bld: 134 mg/dL — ABNORMAL HIGH (ref 70–99)
Potassium: 4.3 mmol/L (ref 3.5–5.1)
Sodium: 139 mmol/L (ref 135–145)
Total Bilirubin: 0.4 mg/dL (ref 0.3–1.2)
Total Protein: 7.7 g/dL (ref 6.5–8.1)

## 2021-02-28 MED ORDER — SODIUM CHLORIDE 0.9 % IV SOLN
INTRAVENOUS | Status: DC
  Filled 2021-02-28: qty 250

## 2021-02-28 MED ORDER — ZOLEDRONIC ACID 4 MG/5ML IV CONC
3.0000 mg | Freq: Once | INTRAVENOUS | Status: AC
  Administered 2021-02-28: 3 mg via INTRAVENOUS
  Filled 2021-02-28: qty 3.75

## 2021-02-28 MED ORDER — LENALIDOMIDE 15 MG PO CAPS: 15.0000 mg | ORAL_CAPSULE | Freq: Every day | ORAL | 0 refills | Status: DC

## 2021-02-28 NOTE — Progress Notes (Signed)
[place] OFFICE PROGRESS NOTE  Patient Care Team: Baxter Hire, MD as PCP - General (Internal Medicine) Druscilla Brownie, MD as Consulting Physician (Dermatology) Cammie Sickle, MD as Consulting Physician (Hematology and Oncology)   Cancer Staging  No matching staging information was found for the patient.   Oncology History Overview Note   # April 2014- MULTIPLE MYELOMA [IgG- 2.2gm/dl; 40-50% plasma cell; Cyto-N; FISH- gain of chr.9, 15 & ccnd1/11q13] s/p RVD x6 [sep 2014; BMBx- ~5% plasma cells]; Maint Rev-DEX-Zometa; March 2015-Stopped Dex Cont- Rev-Zometa; July 2016- Rev $RemoveB'25mg'dCJAEpzU$ ; NOV 4th  2016-HOLD; BMT eval at [facility] [Dr.Woods]; declined BMT.   # Re-START FEB 2017 Rev $Remove'15mg'honPttr$  3 W- on & 1 w OFF; HELD Rev June- Oct 2020- sec to worsening renal function [Dr.Singh]; SEP 2021- Rev 15 mg 2 w-On & 2 w-OFF.   # Zometa  # CKD [creat ~1.4; sec MM; Dr.Singh]; DM  -------------------------------------------------------------------------   DIAGNOSIS: [name] ] MULTIPLE MYELOMA  GOALS: control  CURRENT/MOST RECENT THERAPY- REVLIMID Maintenance [Feb 2017]    Multiple myeloma in remission ([place])  10/01/2015 Initial Diagnosis   Multiple myeloma in remission ([place])     INTERVAL HISTORY:  Alan Alexander 76 y.o.  male pleasant patient above history of multiple myeloma  on Revlimid maintenance [2 weeks on 2-week off] is here for follow-up.  Denies any nausea vomiting.  Denies any rash.  No diarrhea.  No swelling in legs.  Admits to compliance with Revlimid.   Review of Systems  Constitutional:  Negative for chills, diaphoresis, fever, malaise/fatigue and weight loss.  HENT:  Negative for nosebleeds and sore throat.   Eyes:  Negative for double vision.  Respiratory:  Negative for cough, hemoptysis, sputum production, shortness of breath and wheezing.   Cardiovascular:  Positive for leg swelling. Negative for chest pain, palpitations and orthopnea.  Gastrointestinal:   Negative for abdominal pain, blood in stool, constipation, diarrhea, heartburn, melena, nausea and vomiting.  Genitourinary:  Negative for dysuria, frequency and urgency.  Musculoskeletal:  Negative for back pain and joint pain.  Skin: Negative.  Negative for itching and rash.  Neurological:  Negative for dizziness, tingling, focal weakness, weakness and headaches.  Endo/Heme/Allergies:  Does not bruise/bleed easily.  Psychiatric/Behavioral:  Negative for depression. The patient is not nervous/anxious and does not have insomnia.      PAST MEDICAL HISTORY :  Past Medical History:  Diagnosis Date   Diabetes mellitus without complication (HCC)    GERD (gastroesophageal reflux disease)    Hyperlipidemia    Hypertension    Multiple myeloma ([place])    Obesity    Thyroid cancer ([place])    Thyroid disease     PAST SURGICAL HISTORY :   Past Surgical History:  Procedure Laterality Date   BONE MARROW BIOPSY  2014   CATARACT EXTRACTION BILATERAL W/ ANTERIOR VITRECTOMY      FAMILY HISTORY :   Family History  Problem Relation Age of Onset   Cancer Father 60   Diabetes Mother     SOCIAL HISTORY:   Social History   Tobacco Use   Smoking status: Never   Smokeless tobacco: Never  Substance Use Topics   Alcohol use: No   Drug use: No    ALLERGIES:  has No Known Allergies.  MEDICATIONS:  Current Outpatient Medications  Medication Sig Dispense Refill   amLODipine (NORVASC) 10 MG tablet Take 10 mg by mouth daily.     aspirin EC 81 MG tablet Take 81 mg by mouth  daily.     atorvastatin (LIPITOR) 20 MG tablet Take 20 mg by mouth daily.     cloNIDine (CATAPRES) 0.1 MG tablet Take 0.1 mg by mouth 2 (two) times daily.     Cyanocobalamin 1000 MCG TBCR Take by mouth.     famotidine (PEPCID) 20 MG tablet Take 1 tablet (20 mg total) by mouth 2 (two) times daily. 14 tablet 0   lenalidomide (REVLIMID) 15 MG capsule Take 1 capsule (15 mg total) by mouth daily. Take for 14 days, then hold for 14  days. Repeat every 28 days. 14 capsule 0   levothyroxine (SYNTHROID) 137 MCG tablet Take 1 tablet by mouth daily.      ONETOUCH DELICA LANCETS 12R MISC Inject 1 Lancet as directed once daily.     pioglitazone (ACTOS) 30 MG tablet Take 30 mg by mouth daily.     potassium chloride SA (K-DUR,KLOR-CON) 20 MEQ tablet Take 20 mEq by mouth 2 (two) times daily.   0   quinapril (ACCUPRIL) 40 MG tablet Take 40 mg by mouth daily.     torsemide (DEMADEX) 20 MG tablet Take 20 mg by mouth daily as needed.     No current facility-administered medications for this visit.   Facility-Administered Medications Ordered in Other Visits  Medication Dose Route Frequency Provider Last Rate Last Admin   0.9 %  sodium chloride infusion   Intravenous Continuous Cammie Sickle, MD   Stopped at 12/11/14 1528   0.9 %  sodium chloride infusion   Intravenous Continuous Cammie Sickle, MD   Stopped at 02/28/21 1053    PHYSICAL EXAMINATION: ECOG PERFORMANCE STATUS: 0 - Asymptomatic  BP (!) 158/84    Pulse 64    Temp 97.6 F (36.4 C) (Tympanic)    Wt 253 lb 6.4 oz (114.9 kg)    BMI 36.36 kg/m   Filed Weights   02/28/21 0922  Weight: 253 lb 6.4 oz (114.9 kg)     Physical Exam Vitals and nursing note reviewed.  HENT:     Head: Normocephalic and atraumatic.     Mouth/Throat:     Pharynx: Oropharynx is clear. No oropharyngeal exudate.  Eyes:     Extraocular Movements: Extraocular movements intact.     Pupils: Pupils are equal, round, and reactive to light.  Cardiovascular:     Rate and Rhythm: Normal rate and regular rhythm.  Pulmonary:     Effort: No respiratory distress.     Breath sounds: No wheezing.     Comments: Decreased breath sounds bilaterally.  Abdominal:     General: Bowel sounds are normal. There is no distension.     Palpations: Abdomen is soft. There is no mass.     Tenderness: There is no abdominal tenderness. There is no guarding or rebound.  Musculoskeletal:        General:  No tenderness. Normal range of motion.     Cervical back: Normal range of motion and neck supple.  Skin:    General: Skin is warm.  Neurological:     General: No focal deficit present.     Mental Status: He is alert and oriented to person, place, and time.  Psychiatric:        Mood and Affect: Affect normal.        Behavior: Behavior normal.        Judgment: Judgment normal.      LABORATORY DATA:  I have reviewed the data as listed    Component Value Date/Time  NA 139 02/28/2021 0846   NA 139 05/01/2014 1322   K 4.3 02/28/2021 0846   K 4.4 05/01/2014 1322   CL 108 02/28/2021 0846   CL 107 05/01/2014 1322   CO2 23 02/28/2021 0846   CO2 27 05/01/2014 1322   GLUCOSE 134 (H) 02/28/2021 0846   GLUCOSE 102 (H) 05/01/2014 1322   BUN 17 02/28/2021 0846   BUN 16 05/01/2014 1322   CREATININE 1.37 (H) 02/28/2021 0846   CREATININE 1.48 (H) 05/29/2014 0949   CALCIUM 9.1 02/28/2021 0846   CALCIUM 9.1 05/01/2014 1322   PROT 7.7 02/28/2021 0846   PROT 7.5 05/01/2014 1322   ALBUMIN 3.8 02/28/2021 0846   ALBUMIN 3.6 05/01/2014 1322   AST 19 02/28/2021 0846   AST 17 05/01/2014 1322   ALT 18 02/28/2021 0846   ALT 13 (L) 05/01/2014 1322   ALKPHOS 57 02/28/2021 0846   ALKPHOS 47 05/01/2014 1322   BILITOT 0.4 02/28/2021 0846   BILITOT 0.6 05/01/2014 1322   GFRNONAA 53 (L) 02/28/2021 0846   GFRNONAA 47 (L) 05/29/2014 0949   GFRAA 47 (L) 10/21/2019 1106   GFRAA 55 (L) 05/29/2014 0949    No results found for: SPEP, UPEP  Lab Results  Component Value Date   WBC 3.5 (L) 02/28/2021   NEUTROABS 1.9 02/28/2021   HGB 13.3 02/28/2021   HCT 41.3 02/28/2021   MCV 94.3 02/28/2021   PLT 153 02/28/2021      Chemistry      Component Value Date/Time   NA 139 02/28/2021 0846   NA 139 05/01/2014 1322   K 4.3 02/28/2021 0846   K 4.4 05/01/2014 1322   CL 108 02/28/2021 0846   CL 107 05/01/2014 1322   CO2 23 02/28/2021 0846   CO2 27 05/01/2014 1322   BUN 17 02/28/2021 0846   BUN 16  05/01/2014 1322   CREATININE 1.37 (H) 02/28/2021 0846   CREATININE 1.48 (H) 05/29/2014 0949      Component Value Date/Time   CALCIUM 9.1 02/28/2021 0846   CALCIUM 9.1 05/01/2014 1322   ALKPHOS 57 02/28/2021 0846   ALKPHOS 47 05/01/2014 1322   AST 19 02/28/2021 0846   AST 17 05/01/2014 1322   ALT 18 02/28/2021 0846   ALT 13 (L) 05/01/2014 1322   BILITOT 0.4 02/28/2021 0846   BILITOT 0.6 05/01/2014 1322       RADIOGRAPHIC STUDIES: I have personally reviewed the radiological images as listed and agreed with the findings in the report. No results found.   ASSESSMENT & PLAN:  Multiple myeloma in remission ([place]) #Multiple myeloma-most recently on maintenance Revlimid 15 mg 2  weeks on 2 week off.  OCT 2022 M-protine-NEG; kappa lambda light chain ratio=2.0. await MM labs from today. STABLE.  # On Revlimid 15 mg 2 weeks; 2 weeks OFF; Today ANC 1.9- hemoglobin-12/platelets normal.  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 STABLE   #Bone modifying agent/multiple myeloma-- on zometa 3 mg every other month; ca- 9.1 -Proceed with zometa today.  Recommended continued compliance with ca+vit D. STABLE  Disposition: # today ZOMETA # 1 month- cbc/cmp;  # 2 months-cbc/cmp # Follow up in 3 months -  MD;  labs [CBC, CMP, kappa lambda light chains, multiple myeloma panel]  and zometa- Dr.B   No orders of the defined types were placed in this encounter.   All questions were answered. The patient knows to call the clinic with any problems, questions or concerns.  Cammie Sickle, MD 02/28/2021 11:24 AM

## 2021-02-28 NOTE — Assessment & Plan Note (Addendum)
#  Multiple myeloma-most recently on maintenance Revlimid 15 mg 2  weeks on 2 week off.  OCT 2022 M-protine-NEG; kappa lambda light chain ratio=2.0. await MM labs from today. STABLE.  # On Revlimid 15 mg 2 weeks; 2 weeks OFF; Today ANC 1.9- hemoglobin-12/platelets normal.  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 STABLE   #Bone modifying agent/multiple myeloma-- on zometa 3 mg every other month; ca- 9.1 -Proceed with zometa today.  Recommended continued compliance with ca+vit D. STABLE  Disposition: # today ZOMETA # 1 month- cbc/cmp;  # 2 months-cbc/cmp # Follow up in 3 months -  MD;  labs [CBC, CMP, kappa lambda light chains, multiple myeloma panel]  and zometa- Dr.B

## 2021-02-28 NOTE — Patient Instructions (Signed)

## 2021-03-01 LAB — KAPPA/LAMBDA LIGHT CHAINS
Kappa free light chain: 77.4 mg/L — ABNORMAL HIGH (ref 3.3–19.4)
Kappa, lambda light chain ratio: 2.15 — ABNORMAL HIGH (ref 0.26–1.65)
Lambda free light chains: 36 mg/L — ABNORMAL HIGH (ref 5.7–26.3)

## 2021-03-02 LAB — MULTIPLE MYELOMA PANEL, SERUM
Albumin SerPl Elph-Mcnc: 3.4 g/dL (ref 2.9–4.4)
Albumin/Glob SerPl: 1 (ref 0.7–1.7)
Alpha 1: 0.2 g/dL (ref 0.0–0.4)
Alpha2 Glob SerPl Elph-Mcnc: 0.7 g/dL (ref 0.4–1.0)
B-Globulin SerPl Elph-Mcnc: 1.2 g/dL (ref 0.7–1.3)
Gamma Glob SerPl Elph-Mcnc: 1.5 g/dL (ref 0.4–1.8)
Globulin, Total: 3.6 g/dL (ref 2.2–3.9)
IgA: 634 mg/dL — ABNORMAL HIGH (ref 61–437)
IgG (Immunoglobin G), Serum: 1376 mg/dL (ref 603–1613)
IgM (Immunoglobulin M), Srm: 40 mg/dL (ref 15–143)
Total Protein ELP: 7 g/dL (ref 6.0–8.5)

## 2021-03-25 ENCOUNTER — Other Ambulatory Visit: Payer: Self-pay | Admitting: *Deleted

## 2021-03-25 DIAGNOSIS — C9001 Multiple myeloma in remission: Secondary | ICD-10-CM

## 2021-03-25 MED ORDER — LENALIDOMIDE 15 MG PO CAPS: 15.0000 mg | ORAL_CAPSULE | Freq: Every day | ORAL | 0 refills | Status: DC

## 2021-03-28 ENCOUNTER — Other Ambulatory Visit: Payer: Self-pay

## 2021-03-28 ENCOUNTER — Inpatient Hospital Stay: Payer: Medicare HMO | Attending: Internal Medicine

## 2021-03-28 DIAGNOSIS — C9001 Multiple myeloma in remission: Secondary | ICD-10-CM | POA: Diagnosis present

## 2021-03-28 LAB — CBC WITH DIFFERENTIAL/PLATELET
Abs Immature Granulocytes: 0.07 10*3/uL (ref 0.00–0.07)
Basophils Absolute: 0 10*3/uL (ref 0.0–0.1)
Basophils Relative: 1 %
Eosinophils Absolute: 0.4 10*3/uL (ref 0.0–0.5)
Eosinophils Relative: 8 %
HCT: 40.3 % (ref 39.0–52.0)
Hemoglobin: 12.9 g/dL — ABNORMAL LOW (ref 13.0–17.0)
Immature Granulocytes: 1 %
Lymphocytes Relative: 18 %
Lymphs Abs: 0.9 10*3/uL (ref 0.7–4.0)
MCH: 30.4 pg (ref 26.0–34.0)
MCHC: 32 g/dL (ref 30.0–36.0)
MCV: 94.8 fL (ref 80.0–100.0)
Monocytes Absolute: 0.8 10*3/uL (ref 0.1–1.0)
Monocytes Relative: 17 %
Neutro Abs: 2.7 10*3/uL (ref 1.7–7.7)
Neutrophils Relative %: 55 %
Platelets: 188 10*3/uL (ref 150–400)
RBC: 4.25 MIL/uL (ref 4.22–5.81)
RDW: 15.9 % — ABNORMAL HIGH (ref 11.5–15.5)
WBC: 4.8 10*3/uL (ref 4.0–10.5)
nRBC: 0 % (ref 0.0–0.2)

## 2021-03-28 LAB — COMPREHENSIVE METABOLIC PANEL
ALT: 23 U/L (ref 0–44)
AST: 20 U/L (ref 15–41)
Albumin: 3.7 g/dL (ref 3.5–5.0)
Alkaline Phosphatase: 57 U/L (ref 38–126)
Anion gap: 5 (ref 5–15)
BUN: 17 mg/dL (ref 8–23)
CO2: 23 mmol/L (ref 22–32)
Calcium: 8.8 mg/dL — ABNORMAL LOW (ref 8.9–10.3)
Chloride: 110 mmol/L (ref 98–111)
Creatinine, Ser: 1.62 mg/dL — ABNORMAL HIGH (ref 0.61–1.24)
GFR, Estimated: 43 mL/min — ABNORMAL LOW (ref >60)
Glucose, Bld: 140 mg/dL — ABNORMAL HIGH (ref 70–99)
Potassium: 4.4 mmol/L (ref 3.5–5.1)
Sodium: 138 mmol/L (ref 135–145)
Total Bilirubin: 0.6 mg/dL (ref 0.3–1.2)
Total Protein: 7.6 g/dL (ref 6.5–8.1)

## 2021-03-30 ENCOUNTER — Other Ambulatory Visit: Payer: Self-pay

## 2021-03-30 ENCOUNTER — Other Ambulatory Visit: Payer: Self-pay | Admitting: *Deleted

## 2021-03-30 DIAGNOSIS — C9001 Multiple myeloma in remission: Secondary | ICD-10-CM

## 2021-03-30 MED ORDER — LENALIDOMIDE 15 MG PO CAPS: ORAL_CAPSULE | ORAL | 0 refills | Status: DC

## 2021-03-30 MED ORDER — LENALIDOMIDE 15 MG PO CAPS: 15.0000 mg | ORAL_CAPSULE | Freq: Every day | ORAL | 0 refills | Status: DC

## 2021-03-30 MED ORDER — LENALIDOMIDE 15 MG PO CAPS
ORAL_CAPSULE | ORAL | 0 refills | Status: DC
Start: 2021-03-30 — End: 2021-04-22

## 2021-03-30 NOTE — Telephone Encounter (Signed)
Rx printed and will be faxed to [facility].

## 2021-03-30 NOTE — Telephone Encounter (Signed)
The Revlimid prescription will not escribe, the one you sent printed and I tried twice to resend it and it said it printed. Please print it off and fax it to [facility]

## 2021-04-22 ENCOUNTER — Other Ambulatory Visit: Payer: Self-pay | Admitting: *Deleted

## 2021-04-22 DIAGNOSIS — C9001 Multiple myeloma in remission: Secondary | ICD-10-CM

## 2021-04-22 MED ORDER — LENALIDOMIDE 15 MG PO CAPS: 15.0000 mg | ORAL_CAPSULE | Freq: Every day | ORAL | 0 refills | Status: DC

## 2021-04-25 ENCOUNTER — Inpatient Hospital Stay: Payer: Medicare HMO | Attending: Internal Medicine

## 2021-04-25 ENCOUNTER — Other Ambulatory Visit: Payer: Self-pay

## 2021-04-25 ENCOUNTER — Telehealth: Payer: Self-pay | Admitting: *Deleted

## 2021-04-25 DIAGNOSIS — C9001 Multiple myeloma in remission: Secondary | ICD-10-CM | POA: Insufficient documentation

## 2021-04-25 DIAGNOSIS — N183 Chronic kidney disease, stage 3 unspecified: Secondary | ICD-10-CM | POA: Diagnosis not present

## 2021-04-25 LAB — CBC WITH DIFFERENTIAL/PLATELET
Abs Immature Granulocytes: 0.1 10*3/uL — ABNORMAL HIGH (ref 0.00–0.07)
Basophils Absolute: 0 10*3/uL (ref 0.0–0.1)
Basophils Relative: 1 %
Eosinophils Absolute: 0.4 10*3/uL (ref 0.0–0.5)
Eosinophils Relative: 6 %
HCT: 42.3 % (ref 39.0–52.0)
Hemoglobin: 13.6 g/dL (ref 13.0–17.0)
Immature Granulocytes: 2 %
Lymphocytes Relative: 18 %
Lymphs Abs: 1.1 10*3/uL (ref 0.7–4.0)
MCH: 30.4 pg (ref 26.0–34.0)
MCHC: 32.2 g/dL (ref 30.0–36.0)
MCV: 94.6 fL (ref 80.0–100.0)
Monocytes Absolute: 0.9 10*3/uL (ref 0.1–1.0)
Monocytes Relative: 15 %
Neutro Abs: 3.6 10*3/uL (ref 1.7–7.7)
Neutrophils Relative %: 58 %
Platelets: 240 10*3/uL (ref 150–400)
RBC: 4.47 MIL/uL (ref 4.22–5.81)
RDW: 15.2 % (ref 11.5–15.5)
WBC: 6.1 10*3/uL (ref 4.0–10.5)
nRBC: 0 % (ref 0.0–0.2)

## 2021-04-25 LAB — COMPREHENSIVE METABOLIC PANEL
ALT: 24 U/L (ref 0–44)
AST: 21 U/L (ref 15–41)
Albumin: 3.5 g/dL (ref 3.5–5.0)
Alkaline Phosphatase: 64 U/L (ref 38–126)
Anion gap: 7 (ref 5–15)
BUN: 20 mg/dL (ref 8–23)
CO2: 24 mmol/L (ref 22–32)
Calcium: 9 mg/dL (ref 8.9–10.3)
Chloride: 106 mmol/L (ref 98–111)
Creatinine, Ser: 1.64 mg/dL — ABNORMAL HIGH (ref 0.61–1.24)
GFR, Estimated: 43 mL/min — ABNORMAL LOW (ref >60)
Glucose, Bld: 207 mg/dL — ABNORMAL HIGH (ref 70–99)
Potassium: 4.6 mmol/L (ref 3.5–5.1)
Sodium: 137 mmol/L (ref 135–145)
Total Bilirubin: 0.4 mg/dL (ref 0.3–1.2)
Total Protein: 7.5 g/dL (ref 6.5–8.1)

## 2021-04-25 MED ORDER — LENALIDOMIDE 15 MG PO CAPS: 15.0000 mg | ORAL_CAPSULE | Freq: Every day | ORAL | 0 refills | Status: DC

## 2021-04-25 NOTE — Telephone Encounter (Signed)
Done

## 2021-04-25 NOTE — Telephone Encounter (Signed)
The Revlimid prescription sent last week did not escribe, it printed, please fax it ?

## 2021-04-25 NOTE — Progress Notes (Signed)
E-scibing the rx that printed on 04/22/21 ?

## 2021-05-20 ENCOUNTER — Other Ambulatory Visit: Payer: Self-pay | Admitting: *Deleted

## 2021-05-20 DIAGNOSIS — C9001 Multiple myeloma in remission: Secondary | ICD-10-CM

## 2021-05-23 ENCOUNTER — Encounter: Payer: Self-pay | Admitting: Internal Medicine

## 2021-05-23 MED ORDER — LENALIDOMIDE 15 MG PO CAPS: 15.0000 mg | ORAL_CAPSULE | Freq: Every day | ORAL | 0 refills | Status: DC

## 2021-05-30 ENCOUNTER — Inpatient Hospital Stay: Payer: Medicare HMO

## 2021-05-30 ENCOUNTER — Encounter: Payer: Self-pay | Admitting: Internal Medicine

## 2021-05-30 ENCOUNTER — Inpatient Hospital Stay (HOSPITAL_BASED_OUTPATIENT_CLINIC_OR_DEPARTMENT_OTHER): Payer: Medicare HMO | Admitting: Internal Medicine

## 2021-05-30 ENCOUNTER — Inpatient Hospital Stay: Payer: Medicare HMO | Attending: Internal Medicine

## 2021-05-30 DIAGNOSIS — C9001 Multiple myeloma in remission: Secondary | ICD-10-CM | POA: Insufficient documentation

## 2021-05-30 DIAGNOSIS — N183 Chronic kidney disease, stage 3 unspecified: Secondary | ICD-10-CM | POA: Insufficient documentation

## 2021-05-30 DIAGNOSIS — I129 Hypertensive chronic kidney disease with stage 1 through stage 4 chronic kidney disease, or unspecified chronic kidney disease: Secondary | ICD-10-CM | POA: Diagnosis not present

## 2021-05-30 DIAGNOSIS — E1122 Type 2 diabetes mellitus with diabetic chronic kidney disease: Secondary | ICD-10-CM | POA: Insufficient documentation

## 2021-05-30 LAB — COMPREHENSIVE METABOLIC PANEL
ALT: 14 U/L (ref 0–44)
AST: 17 U/L (ref 15–41)
Albumin: 3.9 g/dL (ref 3.5–5.0)
Alkaline Phosphatase: 72 U/L (ref 38–126)
Anion gap: 4 — ABNORMAL LOW (ref 5–15)
BUN: 18 mg/dL (ref 8–23)
CO2: 23 mmol/L (ref 22–32)
Calcium: 9.3 mg/dL (ref 8.9–10.3)
Chloride: 108 mmol/L (ref 98–111)
Creatinine, Ser: 1.43 mg/dL — ABNORMAL HIGH (ref 0.61–1.24)
GFR, Estimated: 50 mL/min — ABNORMAL LOW (ref >60)
Glucose, Bld: 116 mg/dL — ABNORMAL HIGH (ref 70–99)
Potassium: 4.7 mmol/L (ref 3.5–5.1)
Sodium: 135 mmol/L (ref 135–145)
Total Bilirubin: 0.6 mg/dL (ref 0.3–1.2)
Total Protein: 8.1 g/dL (ref 6.5–8.1)

## 2021-05-30 LAB — CBC WITH DIFFERENTIAL/PLATELET
Abs Immature Granulocytes: 0 10*3/uL (ref 0.00–0.07)
Basophils Absolute: 0 10*3/uL (ref 0.0–0.1)
Basophils Relative: 1 %
Eosinophils Absolute: 0.1 10*3/uL (ref 0.0–0.5)
Eosinophils Relative: 4 %
HCT: 43.6 % (ref 39.0–52.0)
Hemoglobin: 13.9 g/dL (ref 13.0–17.0)
Immature Granulocytes: 0 %
Lymphocytes Relative: 26 %
Lymphs Abs: 1.1 10*3/uL (ref 0.7–4.0)
MCH: 29.9 pg (ref 26.0–34.0)
MCHC: 31.9 g/dL (ref 30.0–36.0)
MCV: 93.8 fL (ref 80.0–100.0)
Monocytes Absolute: 0.6 10*3/uL (ref 0.1–1.0)
Monocytes Relative: 16 %
Neutro Abs: 2.1 10*3/uL (ref 1.7–7.7)
Neutrophils Relative %: 53 %
Platelets: 213 10*3/uL (ref 150–400)
RBC: 4.65 MIL/uL (ref 4.22–5.81)
RDW: 14.6 % (ref 11.5–15.5)
WBC: 4 10*3/uL (ref 4.0–10.5)
nRBC: 0 % (ref 0.0–0.2)

## 2021-05-30 MED ORDER — SODIUM CHLORIDE 0.9 % IV SOLN
Freq: Once | INTRAVENOUS | Status: AC
  Filled 2021-05-30: qty 250

## 2021-05-30 MED ORDER — ZOLEDRONIC ACID 4 MG/5ML IV CONC
3.0000 mg | Freq: Once | INTRAVENOUS | Status: AC
  Administered 2021-05-30: 3 mg via INTRAVENOUS
  Filled 2021-05-30: qty 3.75

## 2021-05-30 NOTE — Progress Notes (Signed)
[place] ?OFFICE PROGRESS NOTE ? ?Patient Care Team: ?Baxter Hire, MD as PCP - General (Internal Medicine) ?Druscilla Brownie, MD as Consulting Physician (Dermatology) ?Cammie Sickle, MD as Consulting Physician (Hematology and Oncology) ? ? Cancer Staging  ?No matching staging information was found for the patient. ? ? ?Oncology History Overview Note  ? ?# April 2014- MULTIPLE MYELOMA [IgG- 2.2gm/dl; 40-50% plasma cell; Cyto-N; FISH- gain of chr.9, 15 & ccnd1/11q13] s/p RVD x6 [sep 2014; BMBx- ~5% plasma cells]; Maint Rev-DEX-Zometa; March 2015-Stopped Dex Cont- Rev-Zometa; July 2016- Rev 103m; NOV 4th  2016-HOLD; [facility] declined BMT.  ? ?# Re-START FEB 2017 Rev 157m3 W- on & 1 w OFF; HELD Rev June- Oct 2020- sec to worsening renal function [Dr.Singh]; SEP 2021- Rev 15 mg 2 w-On & 2 w-OFF.  ? ?# Zometa ? ?# CKD [creat ~1.4; sec MM; Dr.Singh]; DM ? ?-------------------------------------------------------------------------  ? ?DIAGNOSIS: [2[name] MULTIPLE MYELOMA ? ?GOALS: control ? ?CURRENT/MOST RECENT THERAPY- REVLIMID Maintenance [Feb 2017] ? ?  ?[place] ([facility] ?10/01/2015 Initial Diagnosis  ? [place] ([facility]? ?  ? ? ?INTERVAL HISTORY: ? ?ThDayven Linsley89.o.  male pleasant patient above history of multiple myeloma  on Revlimid maintenance [2 weeks on 2-week off] is here for follow-up. ? ?Patient denies any skin rash.  Denies any diarrhea.  No swelling in the legs.  No nausea vomiting.  Admits to compliance with Revlimid. ? ? ?Review of Systems  ?Constitutional:  Negative for chills, diaphoresis, fever, malaise/fatigue and weight loss.  ?HENT:  Negative for nosebleeds and sore throat.   ?Eyes:  Negative for double vision.  ?Respiratory:  Negative for cough, hemoptysis, sputum production, shortness of breath and wheezing.   ?Cardiovascular:  Positive for leg swelling. Negative for chest pain, palpitations and orthopnea.   ?Gastrointestinal:  Negative for abdominal pain, blood in stool, constipation, diarrhea, heartburn, melena, nausea and vomiting.  ?Genitourinary:  Negative for dysuria, frequency and urgency.  ?Musculoskeletal:  Negative for back pain and joint pain.  ?Skin: Negative.  Negative for itching and rash.  ?Neurological:  Negative for dizziness, tingling, focal weakness, weakness and headaches.  ?Endo/Heme/Allergies:  Does not bruise/bleed easily.  ?Psychiatric/Behavioral:  Negative for depression. The patient is not nervous/anxious and does not have insomnia.   ?  ? ?PAST MEDICAL HISTORY :  ?Past Medical History:  ?Diagnosis Date  ? Diabetes mellitus without complication ([facility]  ? GERD (gastroesophageal reflux disease)   ? Hyperlipidemia   ? Hypertension   ? Multiple myeloma ([facility] [place]  ? Obesity   ? Thyroid cancer ([facility]  ? Thyroid disease   ? ? ?PAST SURGICAL HISTORY :   ?Past Surgical History:  ?Procedure Laterality Date  ? BONE MARROW BIOPSY  2014  ? CATARACT EXTRACTION BILATERAL W/ ANTERIOR VITRECTOMY    ? ? ?FAMILY HISTORY :   ?Family History  ?Problem Relation Age of Onset  ? Cancer Father [id number]? Diabetes Mother   ? ? ?SOCIAL HISTORY:   ?Social History  ? ?Tobacco Use  ? Smoking status: Never  ? Smokeless tobacco: Never  ?Substance Use Topics  ? Alcohol use: No  ? Drug use: No  ? ? ?ALLERGIES:  has No Known Allergies. ? ?MEDICATIONS:  ?Current Outpatient Medications  ?Medication Sig Dispense Refill  ? amLODipine (NORVASC) 10 MG tablet Take 10 mg by mouth daily.    ? amLODipine (NORVASC) 10 MG tablet Take  1 tablet by mouth daily.    ? aspirin EC 81 MG tablet Take 81 mg by mouth daily.    ? atorvastatin (LIPITOR) 20 MG tablet Take 20 mg by mouth daily.    ? cloNIDine (CATAPRES) 0.1 MG tablet Take 0.1 mg by mouth 2 (two) times daily.    ? Cyanocobalamin 1000 MCG TBCR Take by mouth.    ? dapagliflozin propanediol (FARXIGA) 10 MG TABS tablet TAKE 10 MG BY MOUTH 1 (ONE) TIME EACH DAY IN THE MORNING    ? famotidine (PEPCID)  20 MG tablet Take 1 tablet (20 mg total) by mouth 2 (two) times daily. 14 tablet 0  ? lenalidomide (REVLIMID) 15 MG capsule Take 1 capsule (15 mg total) by mouth daily. Take for 14 days, then hold for 14 days. Repeat every 28 days. 14 capsule 0  ? levothyroxine (SYNTHROID) 137 MCG tablet Take 1 tablet by mouth daily.     ? ONETOUCH DELICA LANCETS 89Q MISC Inject 1 Lancet as directed once daily.    ? pioglitazone (ACTOS) 30 MG tablet Take 30 mg by mouth daily.    ? potassium chloride SA (K-DUR,KLOR-CON) 20 MEQ tablet Take 20 mEq by mouth 2 (two) times daily.   0  ? quinapril (ACCUPRIL) 40 MG tablet Take 40 mg by mouth daily.    ? torsemide (DEMADEX) 20 MG tablet Take 20 mg by mouth daily as needed.    ? ?No current facility-administered medications for this visit.  ? ?Facility-Administered Medications Ordered in Other Visits  ?Medication Dose Route Frequency Provider Last Rate Last Admin  ? 0.9 %  sodium chloride infusion   Intravenous Continuous Cammie Sickle, MD   Stopped at 12/11/14 1528  ? zoledronic acid (ZOMETA) 3 mg in sodium chloride 0.9 % 100 mL IVPB  3 mg Intravenous Once Cammie Sickle, MD      ? ? ?PHYSICAL EXAMINATION: ?ECOG PERFORMANCE STATUS: 0 - Asymptomatic ? ?BP (!) 150/76 (BP Location: Left Arm, Patient Position: Sitting, Cuff Size: Large)   Pulse (!) 57   Temp (!) 97 ?F (36.1 ?C) (Tympanic)   Ht _0  (1.753 m)   Wt 243 lb 6.4 oz (110.4 kg)   SpO2 100%   BMI 35.94 kg/m?  ? Danley Danker Weights  ? 05/30/21 1303  ?Weight: 243 lb 6.4 oz (110.4 kg)  ? ? ? ?Physical Exam ?Vitals and nursing note reviewed.  ?HENT:  ?   Head: Normocephalic and atraumatic.  ?   Mouth/Throat:  ?   Pharynx: Oropharynx is clear. No oropharyngeal exudate.  ?Eyes:  ?   Extraocular Movements: Extraocular movements intact.  ?   Pupils: Pupils are equal, round, and reactive to light.  ?Cardiovascular:  ?   Rate and Rhythm: Normal rate and regular rhythm.  ?Pulmonary:  ?   Effort: No respiratory distress.  ?    Breath sounds: No wheezing.  ?   Comments: Decreased breath sounds bilaterally.  ?Abdominal:  ?   General: Bowel sounds are normal. There is no distension.  ?   Palpations: Abdomen is soft. There is no mass.  ?   Tenderness: There is no abdominal tenderness. There is no guarding or rebound.  ?Musculoskeletal:     ?   General: No tenderness. Normal range of motion.  ?   Cervical back: Normal range of motion and neck supple.  ?Skin: ?   General: Skin is warm.  ?Neurological:  ?   General: No focal deficit present.  ?  Mental Status: He is alert and oriented to person, place, and time.  ?Psychiatric:     ?   Mood and Affect: Affect normal.     ?   Behavior: Behavior normal.     ?   Judgment: Judgment normal.  ? ? ? ? ?LABORATORY DATA:  ?I have reviewed the data as listed ?   ?Component Value Date/Time  ? NA 135 05/30/2021 1239  ? NA 139 05/01/2014 1322  ? K 4.7 05/30/2021 1239  ? K 4.4 05/01/2014 1322  ? CL 108 05/30/2021 1239  ? CL 107 05/01/2014 1322  ? CO2 23 05/30/2021 1239  ? CO2 27 05/01/2014 1322  ? GLUCOSE 116 (H) 05/30/2021 1239  ? GLUCOSE 102 (H) 05/01/2014 1322  ? BUN 18 05/30/2021 1239  ? BUN 16 05/01/2014 1322  ? CREATININE 1.43 (H) 05/30/2021 1239  ? CREATININE 1.48 (H) 05/29/2014 0949  ? CALCIUM 9.3 05/30/2021 1239  ? CALCIUM 9.1 05/01/2014 1322  ? PROT 8.1 05/30/2021 1239  ? PROT 7.5 05/01/2014 1322  ? ALBUMIN 3.9 05/30/2021 1239  ? ALBUMIN 3.6 05/01/2014 1322  ? AST 17 05/30/2021 1239  ? AST 17 05/01/2014 1322  ? ALT 14 05/30/2021 1239  ? ALT 13 (L) 05/01/2014 1322  ? ALKPHOS 72 05/30/2021 1239  ? ALKPHOS 47 05/01/2014 1322  ? BILITOT 0.6 05/30/2021 1239  ? BILITOT 0.6 05/01/2014 1322  ? GFRNONAA 50 (L) 05/30/2021 1239  ? GFRNONAA 47 (L) 05/29/2014 0949  ? GFRAA 47 (L) 10/21/2019 1106  ? GFRAA 55 (L) 05/29/2014 0949  ? ? ?No results found for: SPEP, UPEP ? ?Lab Results  ?Component Value Date  ? WBC 4.0 05/30/2021  ? NEUTROABS 2.1 05/30/2021  ? HGB 13.9 05/30/2021  ? HCT 43.6 05/30/2021  ? MCV 93.8  05/30/2021  ? PLT 213 05/30/2021  ? ? ?  Chemistry   ?   ?Component Value Date/Time  ? NA 135 05/30/2021 1239  ? NA 139 05/01/2014 1322  ? K 4.7 05/30/2021 1239  ? K 4.4 05/01/2014 1322  ? CL 108 05/30/2021 1239  ? CL 1

## 2021-05-30 NOTE — Progress Notes (Signed)
Creatinine 1.43, Per Dr. Rogue Bussing okay to proceed with Zometa as scheduled.  ? ?

## 2021-05-30 NOTE — Patient Instructions (Signed)

## 2021-05-30 NOTE — Assessment & Plan Note (Signed)
#  Multiple myeloma-most recently on maintenance Revlimid 15 mg 2  weeks on 2 week off.  OCT 2022 M-protine-NEG; kappa lambda light chain ratio=2.0. await MM labs from today. STABLE. ? ?# On Revlimid 15 mg 2 weeks; 2 weeks OFF; Today ANC 2.1- hemoglobin-13/platelets normal. ? 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 STABLE  ? ?#Bone modifying agent/multiple myeloma-- on zometa 3 mg every other month; ca- 9.1 -Proceed with zometa today.  Recommended continued compliance with ca+vit D. STABLE ? ?Disposition: ?# today ZOMETA ?# 1 month- cbc/cmp;  ?# 2 months-cbc/cmp ?# Follow up in 3 months -  MD;  labs [facility], CMP, kappa lambda light chains, multiple myeloma panel]  and zometa- Dr.B ?

## 2021-05-31 LAB — KAPPA/LAMBDA LIGHT CHAINS
Kappa free light chain: 98.9 mg/L — ABNORMAL HIGH (ref 3.3–19.4)
Kappa, lambda light chain ratio: 2.45 — ABNORMAL HIGH (ref 0.26–1.65)
Lambda free light chains: 40.4 mg/L — ABNORMAL HIGH (ref 5.7–26.3)

## 2021-06-01 LAB — MULTIPLE MYELOMA PANEL, SERUM
Albumin SerPl Elph-Mcnc: 3.5 g/dL (ref 2.9–4.4)
Albumin/Glob SerPl: 1 (ref 0.7–1.7)
Alpha 1: 0.2 g/dL (ref 0.0–0.4)
Alpha2 Glob SerPl Elph-Mcnc: 0.8 g/dL (ref 0.4–1.0)
B-Globulin SerPl Elph-Mcnc: 1.3 g/dL (ref 0.7–1.3)
Gamma Glob SerPl Elph-Mcnc: 1.6 g/dL (ref 0.4–1.8)
Globulin, Total: 3.8 g/dL (ref 2.2–3.9)
IgA: 671 mg/dL — ABNORMAL HIGH (ref 61–437)
IgG (Immunoglobin G), Serum: 1380 mg/dL (ref 603–1613)
IgM (Immunoglobulin M), Srm: 64 mg/dL (ref 15–143)
Total Protein ELP: 7.3 g/dL (ref 6.0–8.5)

## 2021-06-20 ENCOUNTER — Other Ambulatory Visit: Payer: Self-pay | Admitting: *Deleted

## 2021-06-20 DIAGNOSIS — C9001 Multiple myeloma in remission: Secondary | ICD-10-CM

## 2021-06-20 NOTE — Telephone Encounter (Signed)
Authorization [id number]     Authorization Date:06/20/21-aj ?Pt needs to complete survey for this rx. ?

## 2021-06-21 ENCOUNTER — Encounter: Payer: Self-pay | Admitting: Internal Medicine

## 2021-06-21 MED ORDER — LENALIDOMIDE 15 MG PO CAPS: 15.0000 mg | ORAL_CAPSULE | Freq: Every day | ORAL | 0 refills | Status: DC

## 2021-06-29 ENCOUNTER — Inpatient Hospital Stay: Payer: Medicare HMO | Attending: Internal Medicine

## 2021-06-29 DIAGNOSIS — C9001 Multiple myeloma in remission: Secondary | ICD-10-CM | POA: Insufficient documentation

## 2021-06-29 LAB — COMPREHENSIVE METABOLIC PANEL
ALT: 16 U/L (ref 0–44)
AST: 17 U/L (ref 15–41)
Albumin: 3.5 g/dL (ref 3.5–5.0)
Alkaline Phosphatase: 62 U/L (ref 38–126)
Anion gap: 6 (ref 5–15)
BUN: 19 mg/dL (ref 8–23)
CO2: 24 mmol/L (ref 22–32)
Calcium: 9 mg/dL (ref 8.9–10.3)
Chloride: 106 mmol/L (ref 98–111)
Creatinine, Ser: 1.6 mg/dL — ABNORMAL HIGH (ref 0.61–1.24)
GFR, Estimated: 44 mL/min — ABNORMAL LOW (ref >60)
Glucose, Bld: 161 mg/dL — ABNORMAL HIGH (ref 70–99)
Potassium: 4.6 mmol/L (ref 3.5–5.1)
Sodium: 136 mmol/L (ref 135–145)
Total Bilirubin: 0.6 mg/dL (ref 0.3–1.2)
Total Protein: 7.6 g/dL (ref 6.5–8.1)

## 2021-06-29 LAB — CBC WITH DIFFERENTIAL/PLATELET
Abs Immature Granulocytes: 0.01 10*3/uL (ref 0.00–0.07)
Basophils Absolute: 0 10*3/uL (ref 0.0–0.1)
Basophils Relative: 1 %
Eosinophils Absolute: 0.1 10*3/uL (ref 0.0–0.5)
Eosinophils Relative: 4 %
HCT: 40.8 % (ref 39.0–52.0)
Hemoglobin: 13.1 g/dL (ref 13.0–17.0)
Immature Granulocytes: 0 %
Lymphocytes Relative: 25 %
Lymphs Abs: 1 10*3/uL (ref 0.7–4.0)
MCH: 29.4 pg (ref 26.0–34.0)
MCHC: 32.1 g/dL (ref 30.0–36.0)
MCV: 91.7 fL (ref 80.0–100.0)
Monocytes Absolute: 0.8 10*3/uL (ref 0.1–1.0)
Monocytes Relative: 21 %
Neutro Abs: 2 10*3/uL (ref 1.7–7.7)
Neutrophils Relative %: 49 %
Platelets: 190 10*3/uL (ref 150–400)
RBC: 4.45 MIL/uL (ref 4.22–5.81)
RDW: 14.7 % (ref 11.5–15.5)
WBC: 4 10*3/uL (ref 4.0–10.5)
nRBC: 0 % (ref 0.0–0.2)

## 2021-07-18 ENCOUNTER — Other Ambulatory Visit: Payer: Self-pay | Admitting: *Deleted

## 2021-07-18 DIAGNOSIS — C9001 Multiple myeloma in remission: Secondary | ICD-10-CM

## 2021-07-18 MED ORDER — LENALIDOMIDE 15 MG PO CAPS: 15.0000 mg | ORAL_CAPSULE | Freq: Every day | ORAL | 0 refills | Status: DC

## 2021-07-29 ENCOUNTER — Inpatient Hospital Stay: Payer: Medicare HMO | Attending: Internal Medicine

## 2021-07-29 DIAGNOSIS — C9001 Multiple myeloma in remission: Secondary | ICD-10-CM | POA: Diagnosis not present

## 2021-07-29 DIAGNOSIS — N183 Chronic kidney disease, stage 3 unspecified: Secondary | ICD-10-CM | POA: Diagnosis not present

## 2021-07-29 LAB — CBC WITH DIFFERENTIAL/PLATELET
Abs Immature Granulocytes: 0.01 10*3/uL (ref 0.00–0.07)
Basophils Absolute: 0.1 10*3/uL (ref 0.0–0.1)
Basophils Relative: 1 %
Eosinophils Absolute: 0.1 10*3/uL (ref 0.0–0.5)
Eosinophils Relative: 3 %
HCT: 40.4 % (ref 39.0–52.0)
Hemoglobin: 13 g/dL (ref 13.0–17.0)
Immature Granulocytes: 0 %
Lymphocytes Relative: 22 %
Lymphs Abs: 0.8 10*3/uL (ref 0.7–4.0)
MCH: 29.6 pg (ref 26.0–34.0)
MCHC: 32.2 g/dL (ref 30.0–36.0)
MCV: 92 fL (ref 80.0–100.0)
Monocytes Absolute: 0.7 10*3/uL (ref 0.1–1.0)
Monocytes Relative: 18 %
Neutro Abs: 2.1 10*3/uL (ref 1.7–7.7)
Neutrophils Relative %: 56 %
Platelets: 199 10*3/uL (ref 150–400)
RBC: 4.39 MIL/uL (ref 4.22–5.81)
RDW: 15.9 % — ABNORMAL HIGH (ref 11.5–15.5)
WBC: 3.8 10*3/uL — ABNORMAL LOW (ref 4.0–10.5)
nRBC: 0 % (ref 0.0–0.2)

## 2021-07-29 LAB — COMPREHENSIVE METABOLIC PANEL
ALT: 16 U/L (ref 0–44)
AST: 19 U/L (ref 15–41)
Albumin: 3.5 g/dL (ref 3.5–5.0)
Alkaline Phosphatase: 57 U/L (ref 38–126)
Anion gap: 6 (ref 5–15)
BUN: 19 mg/dL (ref 8–23)
CO2: 23 mmol/L (ref 22–32)
Calcium: 8.8 mg/dL — ABNORMAL LOW (ref 8.9–10.3)
Chloride: 107 mmol/L (ref 98–111)
Creatinine, Ser: 1.71 mg/dL — ABNORMAL HIGH (ref 0.61–1.24)
GFR, Estimated: 41 mL/min — ABNORMAL LOW (ref >60)
Glucose, Bld: 238 mg/dL — ABNORMAL HIGH (ref 70–99)
Potassium: 4.3 mmol/L (ref 3.5–5.1)
Sodium: 136 mmol/L (ref 135–145)
Total Bilirubin: 0.7 mg/dL (ref 0.3–1.2)
Total Protein: 7.6 g/dL (ref 6.5–8.1)

## 2021-08-12 ENCOUNTER — Other Ambulatory Visit: Payer: Self-pay

## 2021-08-12 ENCOUNTER — Telehealth: Payer: Self-pay

## 2021-08-12 DIAGNOSIS — C9001 Multiple myeloma in remission: Secondary | ICD-10-CM

## 2021-08-12 MED ORDER — LENALIDOMIDE 15 MG PO CAPS: 15.0000 mg | ORAL_CAPSULE | Freq: Every day | ORAL | 0 refills | Status: DC

## 2021-08-12 NOTE — Telephone Encounter (Signed)
Next office visit on 7/24 with APP  Last office visit 05/30/21

## 2021-08-12 NOTE — Telephone Encounter (Signed)
Buena from [employer] [facility] called requesting refill for patients Lenalidomide (Revlimid) '15mg'$ . 1 capsule daily for 14 days, Hold for 14 days, Repeat every 28 days. Qt:14 capsules. I see phone note from 7/7 pending medication order.

## 2021-08-29 ENCOUNTER — Ambulatory Visit: Payer: Medicare HMO | Admitting: Internal Medicine

## 2021-08-29 ENCOUNTER — Encounter: Payer: Self-pay | Admitting: Nurse Practitioner

## 2021-08-29 ENCOUNTER — Inpatient Hospital Stay: Payer: Medicare HMO | Attending: Nurse Practitioner

## 2021-08-29 ENCOUNTER — Inpatient Hospital Stay (HOSPITAL_BASED_OUTPATIENT_CLINIC_OR_DEPARTMENT_OTHER): Payer: Medicare HMO | Admitting: Nurse Practitioner

## 2021-08-29 ENCOUNTER — Inpatient Hospital Stay: Payer: Medicare HMO

## 2021-08-29 VITALS — BP 142/73 | HR 56 | Temp 98.1°F | Ht 69.0 in | Wt 243.0 lb

## 2021-08-29 DIAGNOSIS — C9001 Multiple myeloma in remission: Secondary | ICD-10-CM

## 2021-08-29 DIAGNOSIS — I129 Hypertensive chronic kidney disease with stage 1 through stage 4 chronic kidney disease, or unspecified chronic kidney disease: Secondary | ICD-10-CM | POA: Insufficient documentation

## 2021-08-29 DIAGNOSIS — E1122 Type 2 diabetes mellitus with diabetic chronic kidney disease: Secondary | ICD-10-CM | POA: Diagnosis not present

## 2021-08-29 DIAGNOSIS — Z809 Family history of malignant neoplasm, unspecified: Secondary | ICD-10-CM | POA: Diagnosis not present

## 2021-08-29 DIAGNOSIS — N183 Chronic kidney disease, stage 3 unspecified: Secondary | ICD-10-CM | POA: Diagnosis not present

## 2021-08-29 LAB — COMPREHENSIVE METABOLIC PANEL
ALT: 19 U/L (ref 0–44)
AST: 20 U/L (ref 15–41)
Albumin: 3.7 g/dL (ref 3.5–5.0)
Alkaline Phosphatase: 57 U/L (ref 38–126)
Anion gap: <3 — ABNORMAL LOW (ref 5–15)
BUN: 16 mg/dL (ref 8–23)
CO2: 21 mmol/L — ABNORMAL LOW (ref 22–32)
Calcium: 8.4 mg/dL — ABNORMAL LOW (ref 8.9–10.3)
Chloride: 113 mmol/L — ABNORMAL HIGH (ref 98–111)
Creatinine, Ser: 1.42 mg/dL — ABNORMAL HIGH (ref 0.61–1.24)
GFR, Estimated: 51 mL/min — ABNORMAL LOW (ref >60)
Glucose, Bld: 143 mg/dL — ABNORMAL HIGH (ref 70–99)
Potassium: 4 mmol/L (ref 3.5–5.1)
Sodium: 135 mmol/L (ref 135–145)
Total Bilirubin: 0.6 mg/dL (ref 0.3–1.2)
Total Protein: 7.7 g/dL (ref 6.5–8.1)

## 2021-08-29 LAB — CBC WITH DIFFERENTIAL/PLATELET
Abs Immature Granulocytes: 0.02 10*3/uL (ref 0.00–0.07)
Basophils Absolute: 0 10*3/uL (ref 0.0–0.1)
Basophils Relative: 1 %
Eosinophils Absolute: 0.2 10*3/uL (ref 0.0–0.5)
Eosinophils Relative: 7 %
HCT: 39.6 % (ref 39.0–52.0)
Hemoglobin: 12.9 g/dL — ABNORMAL LOW (ref 13.0–17.0)
Immature Granulocytes: 1 %
Lymphocytes Relative: 22 %
Lymphs Abs: 0.8 10*3/uL (ref 0.7–4.0)
MCH: 29.9 pg (ref 26.0–34.0)
MCHC: 32.6 g/dL (ref 30.0–36.0)
MCV: 91.9 fL (ref 80.0–100.0)
Monocytes Absolute: 0.4 10*3/uL (ref 0.1–1.0)
Monocytes Relative: 10 %
Neutro Abs: 2.1 10*3/uL (ref 1.7–7.7)
Neutrophils Relative %: 59 %
Platelets: 271 10*3/uL (ref 150–400)
RBC: 4.31 MIL/uL (ref 4.22–5.81)
RDW: 16.2 % — ABNORMAL HIGH (ref 11.5–15.5)
WBC: 3.6 10*3/uL — ABNORMAL LOW (ref 4.0–10.5)
nRBC: 0 % (ref 0.0–0.2)

## 2021-08-29 NOTE — Progress Notes (Signed)
[place] OFFICE PROGRESS NOTE  Patient Care Team: Baxter Hire, MD as PCP - General (Internal Medicine) Druscilla Brownie, MD as Consulting Physician (Dermatology) Cammie Sickle, MD as Consulting Physician (Hematology and Oncology)   Cancer Staging  No matching staging information was found for the patient.   Oncology History Overview Note   # April 2014- MULTIPLE MYELOMA [IgG- 2.2gm/dl; 40-50% plasma cell; Cyto-N; FISH- gain of chr.9, 15 & ccnd1/11q13] s/p RVD x6 [sep 2014; BMBx- ~5% plasma cells]; Maint Rev-DEX-Zometa; March 2015-Stopped Dex Cont- Rev-Zometa; July 2016- Rev 69m; NOV 4th  2016-HOLD[facility] declined BMT.   # Re-START FEB 2017 Rev 153m3 W- on & 1 w OFF; HELD Rev June- Oct 2020- sec to worsening renal function [Dr.Singh]; SEP 2021- Rev 15 mg 2 w-On & 2 w-OFF.   # Zometa  # CKD [creat ~1.4; sec MM; Dr.Singh]; DM  -------------------------------------------------------------------------   DIAGNOSIS: [2[name] MULTIPLE MYELOMA  GOALS: control  CURRENT/MOST RECENT THERAPY- REVLIMID Maintenance [Feb 2017]    [place] ([facility] [place] 10/01/2015 Initial Diagnosis   [place] in [place] ([facility]    INTERVAL HISTORY:  [name]89.o.  male pleasant patient above history of multiple myeloma  on Revlimid maintenance [2 weeks on 2-week off] is here for follow-up.  Patient denies any skin rash.  Denies any diarrhea.  No swelling in the legs.  No nausea vomiting.  Admits to compliance with Revlimid.   Review of Systems  Constitutional:  Negative for chills, diaphoresis, fever, malaise/fatigue and weight loss.  HENT:  Negative for nosebleeds and sore throat.   Eyes:  Negative for double vision.  Respiratory:  Negative for cough, hemoptysis, sputum production, shortness of breath and wheezing.   Cardiovascular:  Positive for leg swelling. Negative for chest pain, palpitations and orthopnea.   Gastrointestinal:  Negative for abdominal pain, blood in stool, constipation, diarrhea, heartburn, melena, nausea and vomiting.  Genitourinary:  Negative for dysuria, frequency and urgency.  Musculoskeletal:  Negative for back pain and joint pain.  Skin: Negative.  Negative for itching and rash.  Neurological:  Negative for dizziness, tingling, focal weakness, weakness and headaches.  Endo/Heme/Allergies:  Does not bruise/bleed easily.  Psychiatric/Behavioral:  Negative for depression. The patient is not nervous/anxious and does not have insomnia.       PAST MEDICAL HISTORY :  Past Medical History:  Diagnosis Date   Diabetes mellitus without complication (HCC)    GERD (gastroesophageal reflux disease)    Hyperlipidemia    Hypertension    Multiple myeloma ([facility] [place]   Obesity    Thyroid cancer (HCE[facility]   Thyroid disease     PAST SURGICAL HISTORY :   Past Surgical History:  Procedure Laterality Date   BONE MARROW BIOPSY  2014   CATARACT EXTRACTION BILATERAL W/ ANTERIOR VITRECTOMY      FAMILY HISTORY :   Family History  Problem Relation Age of Onset   Cancer Father [id number] Diabetes Mother     SOCIAL HISTORY:   Social History   Tobacco Use   Smoking status: Never   Smokeless tobacco: Never  Substance Use Topics   Alcohol use: No   Drug use: No    ALLERGIES:  has No Known Allergies.  MEDICATIONS:  Current Outpatient Medications  Medication Sig Dispense Refill   amLODipine (NORVASC) 10 MG tablet Take 10 mg by mouth daily.     amLODipine (NORVASC) 10 MG tablet Take  1 tablet by mouth daily.     aspirin EC 81 MG tablet Take 81 mg by mouth daily.     atorvastatin (LIPITOR) 20 MG tablet Take 20 mg by mouth daily.     cloNIDine (CATAPRES) 0.1 MG tablet Take 0.1 mg by mouth 2 (two) times daily.     Cyanocobalamin 1000 MCG TBCR Take by mouth.     dapagliflozin propanediol (FARXIGA) 10 MG TABS tablet TAKE 10 MG BY MOUTH 1 (ONE) TIME EACH DAY IN THE MORNING     famotidine  (PEPCID) 20 MG tablet Take 1 tablet (20 mg total) by mouth 2 (two) times daily. 14 tablet 0   lenalidomide (REVLIMID) 15 MG capsule Take 1 capsule (15 mg total) by mouth daily. Take for 14 days, then hold for 14 days. Repeat every 28 days. 14 capsule 0   levothyroxine (SYNTHROID) 137 MCG tablet Take 1 tablet by mouth daily.      ONETOUCH DELICA LANCETS 16B MISC Inject 1 Lancet as directed once daily.     pioglitazone (ACTOS) 30 MG tablet Take 30 mg by mouth daily.     potassium chloride SA (K-DUR,KLOR-CON) 20 MEQ tablet Take 20 mEq by mouth 2 (two) times daily.   0   quinapril (ACCUPRIL) 40 MG tablet Take 40 mg by mouth daily.     torsemide (DEMADEX) 20 MG tablet Take 20 mg by mouth daily as needed.     No current facility-administered medications for this visit.   Facility-Administered Medications Ordered in Other Visits  Medication Dose Route Frequency Provider Last Rate Last Admin   0.9 %  sodium chloride infusion   Intravenous Continuous Cammie Sickle, MD   Stopped at 12/11/14 1528    PHYSICAL EXAMINATION: ECOG PERFORMANCE STATUS: 0 - Asymptomatic  BP (!) 142/73 (BP Location: Right Arm, Patient Position: Sitting, Cuff Size: Normal)   Pulse (!) 56   Temp 98.1 F (36.7 C) (Tympanic)   Ht '5\' 9"'  (1.753 m)   Wt 243 lb (110.2 kg)   SpO2 100%   BMI 35.88 kg/m   Filed Weights   08/29/21 1509  Weight: 243 lb (110.2 kg)     Physical Exam Vitals and nursing note reviewed.  HENT:     Head: Normocephalic and atraumatic.     Mouth/Throat:     Pharynx: Oropharynx is clear. No oropharyngeal exudate.  Eyes:     Extraocular Movements: Extraocular movements intact.     Pupils: Pupils are equal, round, and reactive to light.  Cardiovascular:     Rate and Rhythm: Normal rate and regular rhythm.  Pulmonary:     Effort: No respiratory distress.     Breath sounds: No wheezing.     Comments: Decreased breath sounds bilaterally.  Abdominal:     General: Bowel sounds are normal.  There is no distension.     Palpations: Abdomen is soft. There is no mass.     Tenderness: There is no abdominal tenderness. There is no guarding or rebound.  Musculoskeletal:        General: No tenderness. Normal range of motion.     Cervical back: Normal range of motion and neck supple.  Skin:    General: Skin is warm.  Neurological:     General: No focal deficit present.     Mental Status: He is alert and oriented to person, place, and time.  Psychiatric:        Mood and Affect: Affect normal.  Behavior: Behavior normal.        Judgment: Judgment normal.       LABORATORY DATA:  I have reviewed the data as listed    Component Value Date/Time   NA 136 07/29/2021 1120   NA 139 05/01/2014 1322   K 4.3 07/29/2021 1120   K 4.4 05/01/2014 1322   CL 107 07/29/2021 1120   CL 107 05/01/2014 1322   CO2 23 07/29/2021 1120   CO2 27 05/01/2014 1322   GLUCOSE 238 (H) 07/29/2021 1120   GLUCOSE 102 (H) 05/01/2014 1322   BUN 19 07/29/2021 1120   BUN 16 05/01/2014 1322   CREATININE 1.71 (H) 07/29/2021 1120   CREATININE 1.48 (H) 05/29/2014 0949   CALCIUM 8.8 (L) 07/29/2021 1120   CALCIUM 9.1 05/01/2014 1322   PROT 7.6 07/29/2021 1120   PROT 7.5 05/01/2014 1322   ALBUMIN 3.5 07/29/2021 1120   ALBUMIN 3.6 05/01/2014 1322   AST 19 07/29/2021 1120   AST 17 05/01/2014 1322   ALT 16 07/29/2021 1120   ALT 13 (L) 05/01/2014 1322   ALKPHOS 57 07/29/2021 1120   ALKPHOS 47 05/01/2014 1322   BILITOT 0.7 07/29/2021 1120   BILITOT 0.6 05/01/2014 1322   GFRNONAA 41 (L) 07/29/2021 1120   GFRNONAA 47 (L) 05/29/2014 0949   GFRAA 47 (L) 10/21/2019 1106   GFRAA 55 (L) 05/29/2014 0949    No results found for: "SPEP", "UPEP"  Lab Results  Component Value Date   WBC 3.6 (L) 08/29/2021   NEUTROABS 2.1 08/29/2021   HGB 12.9 (L) 08/29/2021   HCT 39.6 08/29/2021   MCV 91.9 08/29/2021   PLT 271 08/29/2021      Chemistry      Component Value Date/Time   NA 136 07/29/2021 1120   NA  139 05/01/2014 1322   K 4.3 07/29/2021 1120   K 4.4 05/01/2014 1322   CL 107 07/29/2021 1120   CL 107 05/01/2014 1322   CO2 23 07/29/2021 1120   CO2 27 05/01/2014 1322   BUN 19 07/29/2021 1120   BUN 16 05/01/2014 1322   CREATININE 1.71 (H) 07/29/2021 1120   CREATININE 1.48 (H) 05/29/2014 0949      Component Value Date/Time   CALCIUM 8.8 (L) 07/29/2021 1120   CALCIUM 9.1 05/01/2014 1322   ALKPHOS 57 07/29/2021 1120   ALKPHOS 47 05/01/2014 1322   AST 19 07/29/2021 1120   AST 17 05/01/2014 1322   ALT 16 07/29/2021 1120   ALT 13 (L) 05/01/2014 1322   BILITOT 0.7 07/29/2021 1120   BILITOT 0.6 05/01/2014 1322       RADIOGRAPHIC STUDIES: I have personally reviewed the radiological images as listed and agreed with the findings in the report. No results found.   ASSESSMENT & PLAN:  No problem-specific Assessment & Plan notes found for this encounter.  Multiple myeloma in remission ([place]) # Multiple myeloma- most recently on maintenance Revlimid 15 mg 2  weeks on 2 week off.  OCT 2022 M-protein - NEG; kappa lambda light chain ratio=2.0. await MM labs from today. STABLE.   # On Revlimid 15 mg 2 weeks; 2 weeks OFF; Today ANC 2.1- hemoglobin-12.9, platelets normal.    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 STABLE    #Bone modifying agent/multiple myeloma-- on zometa 3 mg every other month; ca- 8.4. Worse. Hold zometa today. Question if torsemide contributing. Increase calcium supplementation. Currently taking 600 mg twice a day. Increase to 1800 mg/day along with  vitamin D. Check vitamin d and calcium at next visit.    Disposition: 2 weeks- lab (cbc, cmp, vitamin D), see me, +/- zometa  No orders of the defined types were placed in this encounter.  All questions were answered. The patient knows to call the clinic with any problems, questions or concerns.    Verlon Au, NP 08/29/2021 3:28 PM

## 2021-08-30 LAB — KAPPA/LAMBDA LIGHT CHAINS
Kappa free light chain: 104 mg/L — ABNORMAL HIGH (ref 3.3–19.4)
Kappa, lambda light chain ratio: 2.3 — ABNORMAL HIGH (ref 0.26–1.65)
Lambda free light chains: 45.3 mg/L — ABNORMAL HIGH (ref 5.7–26.3)

## 2021-09-02 LAB — MULTIPLE MYELOMA PANEL, SERUM
Albumin SerPl Elph-Mcnc: 3.3 g/dL (ref 2.9–4.4)
Albumin/Glob SerPl: 1 (ref 0.7–1.7)
Alpha 1: 0.2 g/dL (ref 0.0–0.4)
Alpha2 Glob SerPl Elph-Mcnc: 0.7 g/dL (ref 0.4–1.0)
B-Globulin SerPl Elph-Mcnc: 1.1 g/dL (ref 0.7–1.3)
Gamma Glob SerPl Elph-Mcnc: 1.5 g/dL (ref 0.4–1.8)
Globulin, Total: 3.5 g/dL (ref 2.2–3.9)
IgA: 613 mg/dL — ABNORMAL HIGH (ref 61–437)
IgG (Immunoglobin G), Serum: 1374 mg/dL (ref 603–1613)
IgM (Immunoglobulin M), Srm: 46 mg/dL (ref 15–143)
M Protein SerPl Elph-Mcnc: 0.6 g/dL — ABNORMAL HIGH
Total Protein ELP: 6.8 g/dL (ref 6.0–8.5)

## 2021-09-09 ENCOUNTER — Other Ambulatory Visit: Payer: Self-pay | Admitting: *Deleted

## 2021-09-09 DIAGNOSIS — C9001 Multiple myeloma in remission: Secondary | ICD-10-CM

## 2021-09-09 MED ORDER — LENALIDOMIDE 15 MG PO CAPS: 15.0000 mg | ORAL_CAPSULE | Freq: Every day | ORAL | 0 refills | Status: DC

## 2021-09-13 ENCOUNTER — Inpatient Hospital Stay: Payer: Medicare HMO | Attending: Internal Medicine

## 2021-09-13 ENCOUNTER — Inpatient Hospital Stay (HOSPITAL_BASED_OUTPATIENT_CLINIC_OR_DEPARTMENT_OTHER): Payer: Medicare HMO | Admitting: Nurse Practitioner

## 2021-09-13 ENCOUNTER — Inpatient Hospital Stay: Payer: Medicare HMO

## 2021-09-13 VITALS — BP 138/67 | HR 73 | Temp 97.0°F | Resp 16

## 2021-09-13 DIAGNOSIS — C9001 Multiple myeloma in remission: Secondary | ICD-10-CM

## 2021-09-13 DIAGNOSIS — N183 Chronic kidney disease, stage 3 unspecified: Secondary | ICD-10-CM | POA: Insufficient documentation

## 2021-09-13 DIAGNOSIS — E1122 Type 2 diabetes mellitus with diabetic chronic kidney disease: Secondary | ICD-10-CM | POA: Diagnosis not present

## 2021-09-13 DIAGNOSIS — I129 Hypertensive chronic kidney disease with stage 1 through stage 4 chronic kidney disease, or unspecified chronic kidney disease: Secondary | ICD-10-CM | POA: Insufficient documentation

## 2021-09-13 LAB — COMPREHENSIVE METABOLIC PANEL
ALT: 18 U/L (ref 0–44)
AST: 18 U/L (ref 15–41)
Albumin: 3.7 g/dL (ref 3.5–5.0)
Alkaline Phosphatase: 62 U/L (ref 38–126)
Anion gap: 6 (ref 5–15)
BUN: 21 mg/dL (ref 8–23)
CO2: 24 mmol/L (ref 22–32)
Calcium: 9.3 mg/dL (ref 8.9–10.3)
Chloride: 109 mmol/L (ref 98–111)
Creatinine, Ser: 1.6 mg/dL — ABNORMAL HIGH (ref 0.61–1.24)
GFR, Estimated: 44 mL/min — ABNORMAL LOW (ref >60)
Glucose, Bld: 153 mg/dL — ABNORMAL HIGH (ref 70–99)
Potassium: 5.1 mmol/L (ref 3.5–5.1)
Sodium: 139 mmol/L (ref 135–145)
Total Bilirubin: 0.8 mg/dL (ref 0.3–1.2)
Total Protein: 7.7 g/dL (ref 6.5–8.1)

## 2021-09-13 LAB — CBC WITH DIFFERENTIAL/PLATELET
Abs Immature Granulocytes: 0 10*3/uL (ref 0.00–0.07)
Basophils Absolute: 0.1 10*3/uL (ref 0.0–0.1)
Basophils Relative: 1 %
Eosinophils Absolute: 0.2 10*3/uL (ref 0.0–0.5)
Eosinophils Relative: 4 %
HCT: 41.8 % (ref 39.0–52.0)
Hemoglobin: 13.4 g/dL (ref 13.0–17.0)
Immature Granulocytes: 0 %
Lymphocytes Relative: 21 %
Lymphs Abs: 0.8 10*3/uL (ref 0.7–4.0)
MCH: 29.8 pg (ref 26.0–34.0)
MCHC: 32.1 g/dL (ref 30.0–36.0)
MCV: 92.9 fL (ref 80.0–100.0)
Monocytes Absolute: 0.8 10*3/uL (ref 0.1–1.0)
Monocytes Relative: 20 %
Neutro Abs: 2.1 10*3/uL (ref 1.7–7.7)
Neutrophils Relative %: 54 %
Platelets: 161 10*3/uL (ref 150–400)
RBC: 4.5 MIL/uL (ref 4.22–5.81)
RDW: 15.9 % — ABNORMAL HIGH (ref 11.5–15.5)
WBC: 3.9 10*3/uL — ABNORMAL LOW (ref 4.0–10.5)
nRBC: 0 % (ref 0.0–0.2)

## 2021-09-13 LAB — VITAMIN D 25 HYDROXY (VIT D DEFICIENCY, FRACTURES): Vit D, 25-Hydroxy: 33.38 ng/mL (ref 30–100)

## 2021-09-13 MED ORDER — SODIUM CHLORIDE 0.9 % IV SOLN
INTRAVENOUS | Status: DC
  Filled 2021-09-13: qty 250

## 2021-09-13 MED ORDER — ZOLEDRONIC ACID 4 MG/5ML IV CONC
3.0000 mg | Freq: Once | INTRAVENOUS | Status: AC
  Administered 2021-09-13: 3 mg via INTRAVENOUS
  Filled 2021-09-13: qty 3.75

## 2021-09-13 NOTE — Patient Instructions (Signed)
[facility]  Discharge Instructions: Thank you for choosing [facility] to provide your oncology and hematology care.  If you have a lab appointment with the Cancer Center, please go directly to the Cancer Center and check in at the registration area.  Wear comfortable clothing and clothing appropriate for easy access to any Portacath or PICC line.   We strive to give you quality time with your provider. You may need to reschedule your appointment if you arrive late (15 or more minutes).  Arriving late affects you and other patients whose appointments are after yours.  Also, if you miss three or more appointments without notifying the office, you may be dismissed from the clinic at the provider's discretion.      For prescription refill requests, have your pharmacy contact our office and allow 72 hours for refills to be completed.    Today you received the following chemotherapy and/or immunotherapy agents ZOMETA      To help prevent nausea and vomiting after your treatment, we encourage you to take your nausea medication as directed.  BELOW ARE SYMPTOMS THAT SHOULD BE REPORTED IMMEDIATELY: *FEVER GREATER THAN 100.4 F (38 C) OR HIGHER *CHILLS OR SWEATING *NAUSEA AND VOMITING THAT IS NOT CONTROLLED WITH YOUR NAUSEA MEDICATION *UNUSUAL SHORTNESS OF BREATH *UNUSUAL BRUISING OR BLEEDING *URINARY PROBLEMS (pain or burning when urinating, or frequent urination) *BOWEL PROBLEMS (unusual diarrhea, constipation, pain near the anus) TENDERNESS IN MOUTH AND THROAT WITH OR WITHOUT PRESENCE OF ULCERS (sore throat, sores in mouth, or a toothache) UNUSUAL RASH, SWELLING OR PAIN  UNUSUAL VAGINAL DISCHARGE OR ITCHING   Items with * indicate a potential emergency and should be followed up as soon as possible or go to the Emergency Department if any problems should occur.  Please show the CHEMOTHERAPY ALERT CARD or IMMUNOTHERAPY ALERT CARD at check-in to the  Emergency Department and triage nurse.  Should you have questions after your visit or need to cancel or reschedule your appointment, please contact [facility]  [phone] and follow the prompts.  Office hours are 8:00 a.m. to 4:30 p.m. Monday - Friday. Please note that voicemails left after 4:00 p.m. may not be returned until the following business day.  We are closed weekends and major holidays. You have access to a nurse at all times for urgent questions. Please call the main number to the clinic [phone] and follow the prompts.  For any non-urgent questions, you may also contact your provider using MyChart. We now offer e-Visits for anyone 18 and older to request care online for non-urgent symptoms. For details visit [email or web address].   Also download the MyChart app! Go to the app store, search "MyChart", open the app, select [facility], and log in with your MyChart username and password.  Masks are optional in the cancer centers. If you would like for your care team to wear a mask while they are taking care of you, please let them know. For doctor visits, patients may have with them one support person who is at least 78 years old. At this time, visitors are not allowed in the infusion area.  Zoledronic Acid Injection (Cancer) What is this medication? ZOLEDRONIC ACID (ZOE le dron ik AS id) treats high calcium levels in the blood caused by cancer. It may also be used with chemotherapy to treat weakened bones caused by cancer. It works by slowing down the release of calcium from bones. This lowers calcium levels in   your blood. It also makes your bones stronger and less likely to break (fracture). It belongs to a group of medications called bisphosphonates. This medicine may be used for other purposes; ask your health care provider or pharmacist if you have questions. COMMON BRAND NAME(S): Zometa, Zometa Powder What should I tell my care team before I  take this medication? They need to know if you have any of these conditions: Dehydration Dental disease Kidney disease Liver disease Low levels of calcium in the blood Lung or breathing disease, such as asthma Receiving steroids, such as dexamethasone or prednisone An unusual or allergic reaction to zoledronic acid, other medications, foods, dyes, or preservatives Pregnant or trying to get pregnant Breast-feeding How should I use this medication? This medication is injected into a vein. It is given by your care team in a hospital or clinic setting. Talk to your care team about the use of this medication in children. Special care may be needed. Overdosage: If you think you have taken too much of this medicine contact a poison control center or emergency room at once. NOTE: This medicine is only for you. Do not share this medicine with others. What if I miss a dose? Keep appointments for follow-up doses. It is important not to miss your dose. Call your care team if you are unable to keep an appointment. What may interact with this medication? Certain antibiotics given by injection Diuretics, such as bumetanide, furosemide NSAIDs, medications for pain and inflammation, such as ibuprofen or naproxen Teriparatide Thalidomide This list may not describe all possible interactions. Give your health care provider a list of all the medicines, herbs, non-prescription drugs, or dietary supplements you use. Also tell them if you smoke, drink alcohol, or use illegal drugs. Some items may interact with your medicine. What should I watch for while using this medication? Visit your care team for regular checks on your progress. It may be some time before you see the benefit from this medication. Some people who take this medication have severe bone, joint, or muscle pain. This medication may also increase your risk for jaw problems or a broken thigh bone. Tell your care team right away if you have severe  pain in your jaw, bones, joints, or muscles. Tell you care team if you have any pain that does not go away or that gets worse. Tell your dentist and dental surgeon that you are taking this medication. You should not have major dental surgery while on this medication. See your dentist to have a dental exam and fix any dental problems before starting this medication. Take good care of your teeth while on this medication. Make sure you see your dentist for regular follow-up appointments. You should make sure you get enough calcium and vitamin D while you are taking this medication. Discuss the foods you eat and the vitamins you take with your care team. Check with your care team if you have severe diarrhea, nausea, and vomiting, or if you sweat a lot. The loss of too much body fluid may make it dangerous for you to take this medication. You may need bloodwork while taking this medication. Talk to your care team if you wish to become pregnant or think you might be pregnant. This medication can cause serious birth defects. What side effects may I notice from receiving this medication? Side effects that you should report to your care team as soon as possible: Allergic reactions--skin rash, itching, hives, swelling of the face, lips, tongue, or throat   Kidney injury--decrease in the amount of urine, swelling of the ankles, hands, or feet Low calcium level--muscle pain or cramps, confusion, tingling, or numbness in the hands or feet Osteonecrosis of the jaw--pain, swelling, or redness in the mouth, numbness of the jaw, poor healing after dental work, unusual discharge from the mouth, visible bones in the mouth Severe bone, joint, or muscle pain Side effects that usually do not require medical attention (report to your care team if they continue or are bothersome): Constipation Fatigue Fever Loss of appetite Nausea Stomach pain This list may not describe all possible side effects. Call your doctor for  medical advice about side effects. You may report side effects to FDA at 1-800-FDA-1088. Where should I keep my medication? This medication is given in a hospital or clinic. It will not be stored at home. NOTE: This sheet is a summary. It may not cover all possible information. If you have questions about this medicine, talk to your doctor, pharmacist, or health care provider.  2023 Elsevier/Gold Standard (2021-03-10 00:00:00)    

## 2021-09-13 NOTE — Progress Notes (Signed)
Pt returns for follow-up and Zometa today. He has no new concerns at this time.

## 2021-09-13 NOTE — Progress Notes (Signed)
[name] OFFICE PROGRESS NOTE  Patient Care Team: Baxter Hire, MD as PCP - General (Internal Medicine) Druscilla Brownie, MD as Consulting Physician (Dermatology) Cammie Sickle, MD as Consulting Physician (Hematology and Oncology)   Cancer Staging  No matching staging information was found for the patient.   Oncology History Overview Note   # April 2014- MULTIPLE MYELOMA [IgG- 2.2gm/dl; 40-50% plasma cell; Cyto-N; FISH- gain of chr.9, 15 & ccnd1/11q13] s/p RVD x6 [sep 2014; BMBx- ~5% plasma cells]; Maint Rev-DEX-Zometa; March 2015-Stopped Dex Cont- Rev-Zometa; July 2016- Rev 48m; NOV 4th  2016-HOLD; [facility][Dr.Woods]; declined BMT.   # Re-START FEB 2017 Rev 190m3 W- on & 1 w OFF; HELD Rev June- Oct 2020- sec to worsening renal function [Dr.Singh]; SEP 2021- Rev 15 mg 2 w-On & 2 w-OFF.   # Zometa  # CKD [creat ~1.4; sec MM; Dr.Singh]; DM  -------------------------------------------------------------------------   DIAGNOSIS: [2[name] MULTIPLE MYELOMA  GOALS: control  CURRENT/MOST RECENT THERAPY- REVLIMID Maintenance [Feb 2017]    Multiple [facility] in [facility] ([facility] 10/01/2015 Initial Diagnosis   Multiple myeloma in remission ([name]    INTERVAL HISTORY:  [name]89.o.  male pleasant patient above history of multiple myeloma  on Revlimid maintenance [2 weeks on 2-week off] is here for follow-up.  Patient denies any skin rash.  Denies any diarrhea.  No swelling in the legs.  No nausea vomiting.  Admits to compliance with Revlimid.  Zometa was held last week due to hypocalcemia.   Review of Systems  Constitutional:  Negative for chills, diaphoresis, fever, malaise/fatigue and weight loss.  HENT:  Negative for nosebleeds and sore throat.   Eyes:  Negative for double vision.  Respiratory:  Negative for cough, hemoptysis, sputum production, shortness of breath and wheezing.   Cardiovascular:  Positive for leg swelling. Negative for  chest pain, palpitations and orthopnea.  Gastrointestinal:  Negative for abdominal pain, blood in stool, constipation, diarrhea, heartburn, melena, nausea and vomiting.  Genitourinary:  Negative for dysuria, frequency and urgency.  Musculoskeletal:  Negative for back pain and joint pain.  Skin: Negative.  Negative for itching and rash.  Neurological:  Negative for dizziness, tingling, focal weakness, weakness and headaches.  Endo/Heme/Allergies:  Does not bruise/bleed easily.  Psychiatric/Behavioral:  Negative for depression. The patient is not nervous/anxious and does not have insomnia.       PAST MEDICAL HISTORY :  Past Medical History:  Diagnosis Date   Diabetes mellitus without complication (HCC)    GERD (gastroesophageal reflux disease)    Hyperlipidemia    Hypertension    Multiple myeloma ([facility]   Obesity    Thyroid cancer ([facility]   Thyroid disease     PAST SURGICAL HISTORY :   Past Surgical History:  Procedure Laterality Date   BONE MARROW BIOPSY  2014   CATARACT EXTRACTION BILATERAL W/ ANTERIOR VITRECTOMY      FAMILY HISTORY :   Family History  Problem Relation Age of Onset   Cancer Father 6320 Diabetes Mother     SOCIAL HISTORY:   Social History   Tobacco Use   Smoking status: Never   Smokeless tobacco: Never  Substance Use Topics   Alcohol use: No   Drug use: No    ALLERGIES:  has No Known Allergies.  MEDICATIONS:  Current Outpatient Medications  Medication Sig Dispense Refill   amLODipine (NORVASC) 10 MG tablet Take 10 mg by mouth daily.  amLODipine (NORVASC) 10 MG tablet Take 1 tablet by mouth daily.     aspirin EC 81 MG tablet Take 81 mg by mouth daily.     atorvastatin (LIPITOR) 20 MG tablet Take 20 mg by mouth daily.     cloNIDine (CATAPRES) 0.1 MG tablet Take 0.1 mg by mouth 2 (two) times daily.     Cyanocobalamin 1000 MCG TBCR Take by mouth.     dapagliflozin propanediol (FARXIGA) 10 MG TABS tablet TAKE 10 MG BY MOUTH 1 (ONE) TIME EACH  DAY IN THE MORNING     famotidine (PEPCID) 20 MG tablet Take 1 tablet (20 mg total) by mouth 2 (two) times daily. 14 tablet 0   lenalidomide (REVLIMID) 15 MG capsule Take 1 capsule (15 mg total) by mouth daily. Take for 14 days, then hold for 14 days. Repeat every 28 days. 14 capsule 0   levothyroxine (SYNTHROID) 137 MCG tablet Take 1 tablet by mouth daily.      ONETOUCH DELICA LANCETS 40J MISC Inject 1 Lancet as directed once daily.     pioglitazone (ACTOS) 30 MG tablet Take 30 mg by mouth daily.     potassium chloride SA (K-DUR,KLOR-CON) 20 MEQ tablet Take 20 mEq by mouth 2 (two) times daily.   0   quinapril (ACCUPRIL) 40 MG tablet Take 40 mg by mouth daily.     torsemide (DEMADEX) 20 MG tablet Take 20 mg by mouth daily as needed.     No current facility-administered medications for this visit.   Facility-Administered Medications Ordered in Other Visits  Medication Dose Route Frequency Provider Last Rate Last Admin   0.9 %  sodium chloride infusion   Intravenous Continuous Cammie Sickle, MD   Stopped at 12/11/14 1528    PHYSICAL EXAMINATION: ECOG PERFORMANCE STATUS: 0 - Asymptomatic  BP 138/67   Pulse 73   Temp (!) 97 F (36.1 C) (Tympanic)   Resp 16   SpO2 100%   There were no vitals filed for this visit.    Physical Exam Vitals and nursing note reviewed.  HENT:     Head: Normocephalic and atraumatic.     Mouth/Throat:     Pharynx: Oropharynx is clear. No oropharyngeal exudate.  Eyes:     Extraocular Movements: Extraocular movements intact.     Pupils: Pupils are equal, round, and reactive to light.  Cardiovascular:     Rate and Rhythm: Normal rate and regular rhythm.  Pulmonary:     Effort: No respiratory distress.     Breath sounds: No wheezing.     Comments: Decreased breath sounds bilaterally.  Abdominal:     General: Bowel sounds are normal. There is no distension.     Palpations: Abdomen is soft. There is no mass.     Tenderness: There is no abdominal  tenderness. There is no guarding or rebound.  Musculoskeletal:        General: No tenderness. Normal range of motion.     Cervical back: Normal range of motion and neck supple.  Skin:    General: Skin is warm.  Neurological:     General: No focal deficit present.     Mental Status: He is alert and oriented to person, place, and time.  Psychiatric:        Mood and Affect: Affect normal.        Behavior: Behavior normal.        Judgment: Judgment normal.       LABORATORY DATA:  I have  reviewed the data as listed    Component Value Date/Time   NA 139 09/13/2021 0826   NA 139 05/01/2014 1322   K 5.1 09/13/2021 0826   K 4.4 05/01/2014 1322   CL 109 09/13/2021 0826   CL 107 05/01/2014 1322   CO2 24 09/13/2021 0826   CO2 27 05/01/2014 1322   GLUCOSE 153 (H) 09/13/2021 0826   GLUCOSE 102 (H) 05/01/2014 1322   BUN 21 09/13/2021 0826   BUN 16 05/01/2014 1322   CREATININE 1.60 (H) 09/13/2021 0826   CREATININE 1.48 (H) 05/29/2014 0949   CALCIUM 9.3 09/13/2021 0826   CALCIUM 9.1 05/01/2014 1322   PROT 7.7 09/13/2021 0826   PROT 7.5 05/01/2014 1322   ALBUMIN 3.7 09/13/2021 0826   ALBUMIN 3.6 05/01/2014 1322   AST 18 09/13/2021 0826   AST 17 05/01/2014 1322   ALT 18 09/13/2021 0826   ALT 13 (L) 05/01/2014 1322   ALKPHOS 62 09/13/2021 0826   ALKPHOS 47 05/01/2014 1322   BILITOT 0.8 09/13/2021 0826   BILITOT 0.6 05/01/2014 1322   GFRNONAA 44 (L) 09/13/2021 0826   GFRNONAA 47 (L) 05/29/2014 0949   GFRAA 47 (L) 10/21/2019 1106   GFRAA 55 (L) 05/29/2014 0949    No results found for: "SPEP", "UPEP"  Lab Results  Component Value Date   WBC 3.9 (L) 09/13/2021   NEUTROABS 2.1 09/13/2021   HGB 13.4 09/13/2021   HCT 41.8 09/13/2021   MCV 92.9 09/13/2021   PLT 161 09/13/2021      Chemistry      Component Value Date/Time   NA 139 09/13/2021 0826   NA 139 05/01/2014 1322   K 5.1 09/13/2021 0826   K 4.4 05/01/2014 1322   CL 109 09/13/2021 0826   CL 107 05/01/2014 1322    CO2 24 09/13/2021 0826   CO2 27 05/01/2014 1322   BUN 21 09/13/2021 0826   BUN 16 05/01/2014 1322   CREATININE 1.60 (H) 09/13/2021 0826   CREATININE 1.48 (H) 05/29/2014 0949      Component Value Date/Time   CALCIUM 9.3 09/13/2021 0826   CALCIUM 9.1 05/01/2014 1322   ALKPHOS 62 09/13/2021 0826   ALKPHOS 47 05/01/2014 1322   AST 18 09/13/2021 0826   AST 17 05/01/2014 1322   ALT 18 09/13/2021 0826   ALT 13 (L) 05/01/2014 1322   BILITOT 0.8 09/13/2021 0826   BILITOT 0.6 05/01/2014 1322       RADIOGRAPHIC STUDIES: I have personally reviewed the radiological images as listed and agreed with the findings in the report. No results found.   ASSESSMENT & PLAN:  No problem-specific Assessment & Plan notes found for this encounter.  Multiple myeloma in remission ([place]) # Multiple myeloma- most recently on maintenance Revlimid 15 mg 2  weeks on 2 week off.  OCT 2022 M-protein - NEG; kappa lambda light chain ratio=2.0. July 2023 M protein 0.6. kappa and lambda free light chains rising but ratio remains stable at 2.3.    # On Revlimid 15 mg 2 weeks; 2 weeks OFF; Today ANC 2.1- hemoglobin-13.4. platelets normal.    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 STABLE    #Bone modifying agent/multiple myeloma-- on zometa 3 mg every other month; Held d/t hypocalcemia. Increased calcium to 1800 daily. Calcium now 9.3. Proceed with zometa today. Vitamin D pending.    Disposition: Zometa today 3 mo- lab (cbc, cmp, kllc, mm panel), Dr. Rogue Bussing, +/- zometa- la  No orders  of the defined types were placed in this encounter.  All questions were answered. The patient knows to call the clinic with any problems, questions or concerns.    Verlon Au, NP 09/13/2021 10:27 AM  CC: Dr. Rogue Bussing

## 2021-10-07 ENCOUNTER — Other Ambulatory Visit: Payer: Self-pay | Admitting: *Deleted

## 2021-10-07 DIAGNOSIS — C9001 Multiple myeloma in remission: Secondary | ICD-10-CM

## 2021-10-13 ENCOUNTER — Encounter: Payer: Self-pay | Admitting: Internal Medicine

## 2021-10-13 MED ORDER — LENALIDOMIDE 15 MG PO CAPS: 15.0000 mg | ORAL_CAPSULE | Freq: Every day | ORAL | 0 refills | Status: DC

## 2021-11-11 ENCOUNTER — Other Ambulatory Visit: Payer: Self-pay | Admitting: Pharmacist

## 2021-11-11 DIAGNOSIS — C9001 Multiple myeloma in remission: Secondary | ICD-10-CM

## 2021-11-11 MED ORDER — LENALIDOMIDE 15 MG PO CAPS: 15.0000 mg | ORAL_CAPSULE | Freq: Every day | ORAL | 0 refills | Status: DC

## 2021-12-09 ENCOUNTER — Other Ambulatory Visit: Payer: Self-pay

## 2021-12-09 DIAGNOSIS — C9001 Multiple myeloma in remission: Secondary | ICD-10-CM

## 2021-12-09 MED ORDER — LENALIDOMIDE 15 MG PO CAPS: 15.0000 mg | ORAL_CAPSULE | Freq: Every day | ORAL | 0 refills | Status: DC

## 2021-12-09 NOTE — Telephone Encounter (Signed)
Last visit 09/13/21 Next visit 12/16/21

## 2021-12-15 ENCOUNTER — Other Ambulatory Visit: Payer: Self-pay

## 2021-12-15 DIAGNOSIS — C9001 Multiple myeloma in remission: Secondary | ICD-10-CM

## 2021-12-16 ENCOUNTER — Ambulatory Visit: Payer: Medicare HMO | Admitting: Internal Medicine

## 2021-12-16 ENCOUNTER — Inpatient Hospital Stay: Payer: Medicare HMO

## 2021-12-16 ENCOUNTER — Inpatient Hospital Stay (HOSPITAL_BASED_OUTPATIENT_CLINIC_OR_DEPARTMENT_OTHER): Payer: Medicare HMO | Admitting: Medical Oncology

## 2021-12-16 ENCOUNTER — Other Ambulatory Visit: Payer: Medicare HMO

## 2021-12-16 ENCOUNTER — Inpatient Hospital Stay: Payer: Medicare HMO | Attending: Internal Medicine

## 2021-12-16 ENCOUNTER — Encounter: Payer: Self-pay | Admitting: Medical Oncology

## 2021-12-16 ENCOUNTER — Ambulatory Visit: Payer: Medicare HMO

## 2021-12-16 VITALS — BP 153/74 | HR 71 | Temp 97.3°F | Wt 241.0 lb

## 2021-12-16 DIAGNOSIS — N183 Chronic kidney disease, stage 3 unspecified: Secondary | ICD-10-CM | POA: Insufficient documentation

## 2021-12-16 DIAGNOSIS — E1122 Type 2 diabetes mellitus with diabetic chronic kidney disease: Secondary | ICD-10-CM | POA: Insufficient documentation

## 2021-12-16 DIAGNOSIS — C9001 Multiple myeloma in remission: Secondary | ICD-10-CM | POA: Insufficient documentation

## 2021-12-16 DIAGNOSIS — N1831 Chronic kidney disease, stage 3a: Secondary | ICD-10-CM

## 2021-12-16 DIAGNOSIS — I129 Hypertensive chronic kidney disease with stage 1 through stage 4 chronic kidney disease, or unspecified chronic kidney disease: Secondary | ICD-10-CM | POA: Diagnosis not present

## 2021-12-16 LAB — CBC WITH DIFFERENTIAL/PLATELET
Abs Immature Granulocytes: 0 10*3/uL (ref 0.00–0.07)
Basophils Absolute: 0.1 10*3/uL (ref 0.0–0.1)
Basophils Relative: 1 %
Eosinophils Absolute: 0.1 10*3/uL (ref 0.0–0.5)
Eosinophils Relative: 3 %
HCT: 43.9 % (ref 39.0–52.0)
Hemoglobin: 14 g/dL (ref 13.0–17.0)
Immature Granulocytes: 0 %
Lymphocytes Relative: 33 %
Lymphs Abs: 1.6 10*3/uL (ref 0.7–4.0)
MCH: 30.2 pg (ref 26.0–34.0)
MCHC: 31.9 g/dL (ref 30.0–36.0)
MCV: 94.6 fL (ref 80.0–100.0)
Monocytes Absolute: 0.6 10*3/uL (ref 0.1–1.0)
Monocytes Relative: 12 %
Neutro Abs: 2.5 10*3/uL (ref 1.7–7.7)
Neutrophils Relative %: 51 %
Platelets: 341 10*3/uL (ref 150–400)
RBC: 4.64 MIL/uL (ref 4.22–5.81)
RDW: 15.8 % — ABNORMAL HIGH (ref 11.5–15.5)
WBC: 4.9 10*3/uL (ref 4.0–10.5)
nRBC: 0 % (ref 0.0–0.2)

## 2021-12-16 LAB — COMPREHENSIVE METABOLIC PANEL
ALT: 17 U/L (ref 0–44)
AST: 20 U/L (ref 15–41)
Albumin: 3.9 g/dL (ref 3.5–5.0)
Alkaline Phosphatase: 68 U/L (ref 38–126)
Anion gap: 6 (ref 5–15)
BUN: 20 mg/dL (ref 8–23)
CO2: 22 mmol/L (ref 22–32)
Calcium: 9.4 mg/dL (ref 8.9–10.3)
Chloride: 111 mmol/L (ref 98–111)
Creatinine, Ser: 1.64 mg/dL — ABNORMAL HIGH (ref 0.61–1.24)
GFR, Estimated: 43 mL/min — ABNORMAL LOW (ref >60)
Glucose, Bld: 59 mg/dL — ABNORMAL LOW (ref 70–99)
Potassium: 4.4 mmol/L (ref 3.5–5.1)
Sodium: 139 mmol/L (ref 135–145)
Total Bilirubin: 0.4 mg/dL (ref 0.3–1.2)
Total Protein: 8.4 g/dL — ABNORMAL HIGH (ref 6.5–8.1)

## 2021-12-16 LAB — LACTATE DEHYDROGENASE: LDH: 94 U/L — ABNORMAL LOW (ref 98–192)

## 2021-12-16 MED ORDER — SODIUM CHLORIDE 0.9 % IV SOLN
Freq: Once | INTRAVENOUS | Status: AC
  Filled 2021-12-16: qty 250

## 2021-12-16 MED ORDER — ZOLEDRONIC ACID 4 MG/5ML IV CONC
3.0000 mg | Freq: Once | INTRAVENOUS | Status: AC
  Administered 2021-12-16: 3 mg via INTRAVENOUS
  Filled 2021-12-16: qty 3.75

## 2021-12-16 NOTE — Progress Notes (Signed)
[place] OFFICE PROGRESS NOTE  Patient Care Team: Baxter Hire, MD as PCP - General (Internal Medicine) Druscilla Brownie, MD as Consulting Physician (Dermatology) Cammie Sickle, MD as Consulting Physician (Hematology and Oncology)   Cancer Staging  No matching staging information was found for the patient.   Oncology History Overview Note   # April 2014- MULTIPLE MYELOMA [IgG- 2.2gm/dl; 40-50% plasma cell; Cyto-N; FISH- gain of chr.9, 15 & ccnd1/11q13] s/p RVD x6 [sep 2014; BMBx- ~5% plasma cells]; Maint Rev-DEX-Zometa; March 2015-Stopped Dex Cont- Rev-Zometa; July 2016- Rev 29m; NOV 4th  2016-HOLD; [facility][Dr.Woods]; declined BMT.   # Re-START FEB 2017 Rev 168m3 W- on & 1 w OFF; HELD Rev June- Oct 2020- sec to worsening renal function [Dr.Singh]; SEP 2021- Rev 15 mg 2 w-On & 2 w-OFF.   # Zometa  # CKD [creat ~1.4; sec MM; Dr.Singh]; DM  -------------------------------------------------------------------------   DIAGNOSIS: [2[name] MULTIPLE MYELOMA  GOALS: control  CURRENT/MOST RECENT THERAPY- REVLIMID Maintenance [Feb 2017]    Multiple [place] in [place] ([place] 10/01/2015 Initial Diagnosis   [place]oma in [place] ([facility] [place] [facility]    INTERVAL HISTORY:  [name].o.  male pleasant patient above history of multiple myeloma  on Revlimid maintenance [2 weeks on 2-week off] is here for follow-up.  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   Today he reports that he is feeling well. No concerns today.   Patient denies any skin rash.  Denies any diarrhea.  No swelling in the legs.  No nausea vomiting.  Tolerating his Revlemid well.    Review of Systems  Constitutional:  Negative for chills, diaphoresis, fever, malaise/fatigue and weight loss.  HENT:  Negative for nosebleeds and sore throat.   Eyes:  Negative  for double vision.  Respiratory:  Negative for cough, hemoptysis, sputum production, shortness of breath and wheezing.   Cardiovascular:  Negative for chest pain, palpitations, orthopnea and leg swelling.  Gastrointestinal:  Negative for abdominal pain, blood in stool, constipation, diarrhea, heartburn, melena, nausea and vomiting.  Genitourinary:  Negative for dysuria, frequency and urgency.  Musculoskeletal:  Negative for back pain and joint pain.  Skin: Negative.  Negative for itching and rash.  Neurological:  Negative for dizziness, tingling, focal weakness, weakness and headaches.  Endo/Heme/Allergies:  Does not bruise/bleed easily.  Psychiatric/Behavioral:  Negative for depression. The patient is not nervous/anxious and does not have insomnia.       PAST MEDICAL HISTORY :  Past Medical History:  Diagnosis Date   Diabetes mellitus without complication (HCC)    GERD (gastroesophageal reflux disease)    Hyperlipidemia    Hypertension    Multiple myeloma ([facility]   Obesity    Thyroid cancer ([facility] [place]   Thyroid disease     PAST SURGICAL HISTORY :   Past Surgical History:  Procedure Laterality Date   BONE MARROW BIOPSY  2014   CATARACT EXTRACTION BILATERAL W/ ANTERIOR VITRECTOMY      FAMILY HISTORY :   Family History  Problem Relation Age of Onset   Cancer Father 6347 Diabetes Mother     SOCIAL HISTORY:   Social History   Tobacco Use   Smoking status: Never   Smokeless tobacco: Never  Substance Use Topics   Alcohol use: No   Drug use: No  ALLERGIES:  has No Known Allergies.  MEDICATIONS:  Current Outpatient Medications  Medication Sig Dispense Refill   amLODipine (NORVASC) 10 MG tablet Take 10 mg by mouth daily.     amLODipine (NORVASC) 10 MG tablet Take 1 tablet by mouth daily.     aspirin EC 81 MG tablet Take 81 mg by mouth daily.     atorvastatin (LIPITOR) 20 MG tablet Take 20 mg by mouth daily.     cloNIDine (CATAPRES) 0.1 MG tablet Take 0.1 mg by mouth 2  (two) times daily.     Cyanocobalamin 1000 MCG TBCR Take by mouth.     dapagliflozin propanediol (FARXIGA) 10 MG TABS tablet TAKE 10 MG BY MOUTH 1 (ONE) TIME EACH DAY IN THE MORNING     famotidine (PEPCID) 20 MG tablet Take 1 tablet (20 mg total) by mouth 2 (two) times daily. 14 tablet 0   lenalidomide (REVLIMID) 15 MG capsule Take 1 capsule (15 mg total) by mouth daily. Take for 14 days, then hold for 14 days. Repeat every 28 days. 14 capsule 0   levothyroxine (SYNTHROID) 137 MCG tablet Take 1 tablet by mouth daily.      ONETOUCH DELICA LANCETS 06Y MISC Inject 1 Lancet as directed once daily.     pioglitazone (ACTOS) 30 MG tablet Take 30 mg by mouth daily.     potassium chloride SA (K-DUR,KLOR-CON) 20 MEQ tablet Take 20 mEq by mouth 2 (two) times daily.   0   quinapril (ACCUPRIL) 40 MG tablet Take 40 mg by mouth daily.     torsemide (DEMADEX) 20 MG tablet Take 20 mg by mouth daily as needed.     No current facility-administered medications for this visit.   Facility-Administered Medications Ordered in Other Visits  Medication Dose Route Frequency Provider Last Rate Last Admin   0.9 %  sodium chloride infusion   Intravenous Continuous Cammie Sickle, MD   Stopped at 12/11/14 1528    PHYSICAL EXAMINATION: ECOG PERFORMANCE STATUS: 0 - Asymptomatic  BP (!) 153/74 (BP Location: Left Arm, Patient Position: Sitting)   Pulse 71   Temp (!) 97.3 F (36.3 C) (Tympanic)   Wt 241 lb (109.3 kg)   BMI 35.59 kg/m   Filed Weights   12/16/21 1421  Weight: 241 lb (109.3 kg)      Physical Exam Vitals and nursing note reviewed.  HENT:     Head: Normocephalic and atraumatic.     Mouth/Throat:     Pharynx: Oropharynx is clear. No oropharyngeal exudate.  Eyes:     Extraocular Movements: Extraocular movements intact.     Pupils: Pupils are equal, round, and reactive to light.  Cardiovascular:     Rate and Rhythm: Normal rate and regular rhythm.  Pulmonary:     Effort: No respiratory  distress.     Breath sounds: No wheezing.     Comments: Decreased breath sounds bilaterally.  Abdominal:     General: Bowel sounds are normal. There is no distension.     Palpations: Abdomen is soft. There is no mass.     Tenderness: There is no abdominal tenderness. There is no guarding or rebound.  Musculoskeletal:        General: No tenderness. Normal range of motion.     Cervical back: Normal range of motion and neck supple.  Skin:    General: Skin is warm.  Neurological:     General: No focal deficit present.     Mental Status: He is alert  and oriented to person, place, and time.  Psychiatric:        Mood and Affect: Affect normal.        Behavior: Behavior normal.        Judgment: Judgment normal.       LABORATORY DATA:  I have reviewed the data as listed    Component Value Date/Time   NA 139 12/16/2021 1408   NA 139 05/01/2014 1322   K 4.4 12/16/2021 1408   K 4.4 05/01/2014 1322   CL 111 12/16/2021 1408   CL 107 05/01/2014 1322   CO2 22 12/16/2021 1408   CO2 27 05/01/2014 1322   GLUCOSE 59 (L) 12/16/2021 1408   GLUCOSE 102 (H) 05/01/2014 1322   BUN 20 12/16/2021 1408   BUN 16 05/01/2014 1322   CREATININE 1.64 (H) 12/16/2021 1408   CREATININE 1.48 (H) 05/29/2014 0949   CALCIUM 9.4 12/16/2021 1408   CALCIUM 9.1 05/01/2014 1322   PROT 8.4 (H) 12/16/2021 1408   PROT 7.5 05/01/2014 1322   ALBUMIN 3.9 12/16/2021 1408   ALBUMIN 3.6 05/01/2014 1322   AST 20 12/16/2021 1408   AST 17 05/01/2014 1322   ALT 17 12/16/2021 1408   ALT 13 (L) 05/01/2014 1322   ALKPHOS 68 12/16/2021 1408   ALKPHOS 47 05/01/2014 1322   BILITOT 0.4 12/16/2021 1408   BILITOT 0.6 05/01/2014 1322   GFRNONAA 43 (L) 12/16/2021 1408   GFRNONAA 47 (L) 05/29/2014 0949   GFRAA 47 (L) 10/21/2019 1106   GFRAA 55 (L) 05/29/2014 0949    No results found for: "SPEP", "UPEP"  Lab Results  Component Value Date   WBC 4.9 12/16/2021   NEUTROABS 2.5 12/16/2021   HGB 14.0 12/16/2021   HCT 43.9  12/16/2021   MCV 94.6 12/16/2021   PLT 341 12/16/2021      Chemistry      Component Value Date/Time   NA 139 12/16/2021 1408   NA 139 05/01/2014 1322   K 4.4 12/16/2021 1408   K 4.4 05/01/2014 1322   CL 111 12/16/2021 1408   CL 107 05/01/2014 1322   CO2 22 12/16/2021 1408   CO2 27 05/01/2014 1322   BUN 20 12/16/2021 1408   BUN 16 05/01/2014 1322   CREATININE 1.64 (H) 12/16/2021 1408   CREATININE 1.48 (H) 05/29/2014 0949      Component Value Date/Time   CALCIUM 9.4 12/16/2021 1408   CALCIUM 9.1 05/01/2014 1322   ALKPHOS 68 12/16/2021 1408   ALKPHOS 47 05/01/2014 1322   AST 20 12/16/2021 1408   AST 17 05/01/2014 1322   ALT 17 12/16/2021 1408   ALT 13 (L) 05/01/2014 1322   BILITOT 0.4 12/16/2021 1408   BILITOT 0.6 05/01/2014 1322       RADIOGRAPHIC STUDIES: I have personally reviewed the radiological images as listed and agreed with the findings in the report. No results found.   ASSESSMENT & PLAN:  No problem-specific Assessment & Plan notes found for this encounter.  Multiple myeloma in remission ([place]) # Multiple myeloma- most recently on maintenance Revlimid 15 mg 2  weeks on 2 week off. Labs pending at time of visit.    # On Revlimid 15 mg 2 weeks; 2 weeks OFF; Doing well. WBC 4.9, ANC 2.5, hemoglobin 14, platelets 341. Continue current treatment.    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 Chronic in nature and STABLE    #Bone modifying agent/multiple myeloma-- on zometa 3 mg every other month;  Held d/t hypocalcemia. Increased calcium to 1800 daily. Calcium now 9.4 today. Proceed with zometa today.    Disposition: Zometa today RTC 3 months labs (CBC w/, CMP, light chains, multiple myeloma panel D1; MD +- zometa D2   Orders Placed This Encounter  Procedures   Multiple Myeloma Panel (SPEP&IFE w/QIG)    Standing Status:   Future    Standing Expiration Date:   12/16/2022   All questions were answered. The patient knows to call the clinic  with any problems, questions or concerns.    Alan Closs, PA-C 12/16/2021 2:45 PM  CC: Dr. Rogue Bussing

## 2021-12-19 LAB — KAPPA/LAMBDA LIGHT CHAINS
Kappa free light chain: 86.5 mg/L — ABNORMAL HIGH (ref 3.3–19.4)
Kappa, lambda light chain ratio: 2.47 — ABNORMAL HIGH (ref 0.26–1.65)
Lambda free light chains: 35 mg/L — ABNORMAL HIGH (ref 5.7–26.3)

## 2021-12-20 LAB — MULTIPLE MYELOMA PANEL, SERUM
Albumin SerPl Elph-Mcnc: 3.6 g/dL (ref 2.9–4.4)
Albumin/Glob SerPl: 1 (ref 0.7–1.7)
Alpha 1: 0.2 g/dL (ref 0.0–0.4)
Alpha2 Glob SerPl Elph-Mcnc: 0.7 g/dL (ref 0.4–1.0)
B-Globulin SerPl Elph-Mcnc: 1.3 g/dL (ref 0.7–1.3)
Gamma Glob SerPl Elph-Mcnc: 1.7 g/dL (ref 0.4–1.8)
Globulin, Total: 4 g/dL — ABNORMAL HIGH (ref 2.2–3.9)
IgA: 669 mg/dL — ABNORMAL HIGH (ref 61–437)
IgG (Immunoglobin G), Serum: 1636 mg/dL — ABNORMAL HIGH (ref 603–1613)
IgM (Immunoglobulin M), Srm: 49 mg/dL (ref 15–143)
M Protein SerPl Elph-Mcnc: 0.7 g/dL — ABNORMAL HIGH
Total Protein ELP: 7.6 g/dL (ref 6.0–8.5)

## 2022-01-06 ENCOUNTER — Telehealth: Payer: Self-pay | Admitting: *Deleted

## 2022-01-06 ENCOUNTER — Other Ambulatory Visit: Payer: Self-pay | Admitting: *Deleted

## 2022-01-06 DIAGNOSIS — C9001 Multiple myeloma in remission: Secondary | ICD-10-CM

## 2022-01-06 MED ORDER — LENALIDOMIDE 15 MG PO CAPS: 15.0000 mg | ORAL_CAPSULE | Freq: Every day | ORAL | 0 refills | Status: DC

## 2022-01-06 NOTE — Telephone Encounter (Signed)
Pharmacy request refill order for Revlimid.

## 2022-01-09 ENCOUNTER — Encounter: Payer: Self-pay | Admitting: Internal Medicine

## 2022-02-07 ENCOUNTER — Other Ambulatory Visit: Payer: Self-pay | Admitting: *Deleted

## 2022-02-07 DIAGNOSIS — C9001 Multiple myeloma in remission: Secondary | ICD-10-CM

## 2022-02-07 MED ORDER — LENALIDOMIDE 15 MG PO CAPS: 15.0000 mg | ORAL_CAPSULE | Freq: Every day | ORAL | 0 refills | Status: DC

## 2022-02-27 ENCOUNTER — Other Ambulatory Visit: Payer: Self-pay

## 2022-02-27 DIAGNOSIS — C9001 Multiple myeloma in remission: Secondary | ICD-10-CM

## 2022-02-28 ENCOUNTER — Inpatient Hospital Stay: Payer: Medicare HMO

## 2022-02-28 ENCOUNTER — Inpatient Hospital Stay: Payer: Medicare HMO | Attending: Internal Medicine

## 2022-02-28 ENCOUNTER — Encounter: Payer: Self-pay | Admitting: Internal Medicine

## 2022-02-28 ENCOUNTER — Inpatient Hospital Stay (HOSPITAL_BASED_OUTPATIENT_CLINIC_OR_DEPARTMENT_OTHER): Payer: Medicare HMO | Admitting: Internal Medicine

## 2022-02-28 VITALS — BP 144/73 | HR 59 | Temp 96.3°F | Resp 16 | Wt 230.0 lb

## 2022-02-28 DIAGNOSIS — E1122 Type 2 diabetes mellitus with diabetic chronic kidney disease: Secondary | ICD-10-CM | POA: Insufficient documentation

## 2022-02-28 DIAGNOSIS — C9001 Multiple myeloma in remission: Secondary | ICD-10-CM

## 2022-02-28 DIAGNOSIS — N183 Chronic kidney disease, stage 3 unspecified: Secondary | ICD-10-CM | POA: Diagnosis not present

## 2022-02-28 DIAGNOSIS — I129 Hypertensive chronic kidney disease with stage 1 through stage 4 chronic kidney disease, or unspecified chronic kidney disease: Secondary | ICD-10-CM | POA: Insufficient documentation

## 2022-02-28 DIAGNOSIS — Z85850 Personal history of malignant neoplasm of thyroid: Secondary | ICD-10-CM | POA: Insufficient documentation

## 2022-02-28 LAB — CBC WITH DIFFERENTIAL/PLATELET
Abs Immature Granulocytes: 0.02 10*3/uL (ref 0.00–0.07)
Basophils Absolute: 0 10*3/uL (ref 0.0–0.1)
Basophils Relative: 1 %
Eosinophils Absolute: 0.3 10*3/uL (ref 0.0–0.5)
Eosinophils Relative: 7 %
HCT: 40 % (ref 39.0–52.0)
Hemoglobin: 13.4 g/dL (ref 13.0–17.0)
Immature Granulocytes: 1 %
Lymphocytes Relative: 26 %
Lymphs Abs: 1.1 10*3/uL (ref 0.7–4.0)
MCH: 30.9 pg (ref 26.0–34.0)
MCHC: 33.5 g/dL (ref 30.0–36.0)
MCV: 92.4 fL (ref 80.0–100.0)
Monocytes Absolute: 0.6 10*3/uL (ref 0.1–1.0)
Monocytes Relative: 15 %
Neutro Abs: 2.2 10*3/uL (ref 1.7–7.7)
Neutrophils Relative %: 50 %
Platelets: 144 10*3/uL — ABNORMAL LOW (ref 150–400)
RBC: 4.33 MIL/uL (ref 4.22–5.81)
RDW: 15.1 % (ref 11.5–15.5)
WBC: 4.2 10*3/uL (ref 4.0–10.5)
nRBC: 0 % (ref 0.0–0.2)

## 2022-02-28 LAB — COMPREHENSIVE METABOLIC PANEL
ALT: 22 U/L (ref 0–44)
AST: 20 U/L (ref 15–41)
Albumin: 3.5 g/dL (ref 3.5–5.0)
Alkaline Phosphatase: 57 U/L (ref 38–126)
Anion gap: 8 (ref 5–15)
BUN: 14 mg/dL (ref 8–23)
CO2: 25 mmol/L (ref 22–32)
Calcium: 8.7 mg/dL — ABNORMAL LOW (ref 8.9–10.3)
Chloride: 107 mmol/L (ref 98–111)
Creatinine, Ser: 1.39 mg/dL — ABNORMAL HIGH (ref 0.61–1.24)
GFR, Estimated: 52 mL/min — ABNORMAL LOW (ref >60)
Glucose, Bld: 86 mg/dL (ref 70–99)
Potassium: 4.4 mmol/L (ref 3.5–5.1)
Sodium: 140 mmol/L (ref 135–145)
Total Bilirubin: 0.6 mg/dL (ref 0.3–1.2)
Total Protein: 7.6 g/dL (ref 6.5–8.1)

## 2022-02-28 MED ORDER — ZOLEDRONIC ACID 4 MG/5ML IV CONC
3.0000 mg | Freq: Once | INTRAVENOUS | Status: DC
  Filled 2022-02-28: qty 3.75

## 2022-02-28 NOTE — Progress Notes (Signed)
[place] OFFICE PROGRESS NOTE  Patient Care Team: Baxter Hire, MD as PCP - General (Internal Medicine) Druscilla Brownie, MD as Consulting Physician (Dermatology) Cammie Sickle, MD as Consulting Physician (Hematology and Oncology)   Cancer Staging  No matching staging information was found for the patient.   Oncology History Overview Note   # April 2014- MULTIPLE MYELOMA [IgG- 2.2gm/dl; 40-50% plasma cell; Cyto-N; FISH- gain of chr.9, 15 & ccnd1/11q13] s/p RVD x6 [sep 2014; BMBx- ~5% plasma cells]; Maint Rev-DEX-Zometa; March 2015-Stopped Dex Cont- Rev-Zometa; July 2016- Rev '25mg'$ ; NOV 4th  2016-HOLD; BMT eval at [facility] [Dr.Woods]; declined BMT.   # Re-START FEB 2017 Rev '15mg'$  3 W- on & 1 w OFF; HELD Rev June- Oct 2020- sec to worsening renal function [Dr.Singh]; SEP 2021- Rev 15 mg 2 w-On & 2 w-OFF.   # Zometa  # CKD [creat ~1.4; sec MM; Dr.Singh]; DM  -------------------------------------------------------------------------   DIAGNOSIS: [name] ] MULTIPLE MYELOMA  GOALS: control  CURRENT/MOST RECENT THERAPY- REVLIMID Maintenance [Feb 2017]    Multiple myeloma in remission ([place])  10/01/2015 Initial Diagnosis   Multiple myeloma in remission ([place])     INTERVAL HISTORY: pt is alone. Ambulating independently.   Alan Alexander 77 y.o.  male pleasant patient above history of multiple myeloma  on Revlimid maintenance [2 weeks on 2-week off] is here for follow-up.  Patient denies new problems/concerns today.  Good appetite with 11 lb wt loss but he has been walking more for exercise.   Patient denies any skin rash.  Denies any diarrhea.  No swelling in the legs.  No nausea vomiting.  Admits to compliance with Revlimid.  Review of Systems  Constitutional:  Negative for chills, diaphoresis, fever, malaise/fatigue and weight loss.  HENT:  Negative for nosebleeds and sore throat.   Eyes:  Negative for double vision.  Respiratory:  Negative for cough,  hemoptysis, sputum production, shortness of breath and wheezing.   Cardiovascular:  Positive for leg swelling. Negative for chest pain, palpitations and orthopnea.  Gastrointestinal:  Negative for abdominal pain, blood in stool, constipation, diarrhea, heartburn, melena, nausea and vomiting.  Genitourinary:  Negative for dysuria, frequency and urgency.  Musculoskeletal:  Negative for back pain and joint pain.  Skin: Negative.  Negative for itching and rash.  Neurological:  Negative for dizziness, tingling, focal weakness, weakness and headaches.  Endo/Heme/Allergies:  Does not bruise/bleed easily.  Psychiatric/Behavioral:  Negative for depression. The patient is not nervous/anxious and does not have insomnia.       PAST MEDICAL HISTORY :  Past Medical History:  Diagnosis Date   Diabetes mellitus without complication (HCC)    GERD (gastroesophageal reflux disease)    Hyperlipidemia    Hypertension    Multiple myeloma ([place])    Obesity    Thyroid cancer ([place])    Thyroid disease     PAST SURGICAL HISTORY :   Past Surgical History:  Procedure Laterality Date   BONE MARROW BIOPSY  2014   CATARACT EXTRACTION BILATERAL W/ ANTERIOR VITRECTOMY      FAMILY HISTORY :   Family History  Problem Relation Age of Onset   Cancer Father 56   Diabetes Mother     SOCIAL HISTORY:   Social History   Tobacco Use   Smoking status: Never   Smokeless tobacco: Never  Substance Use Topics   Alcohol use: No   Drug use: No    ALLERGIES:  has No Known Allergies.  MEDICATIONS:  Current Outpatient Medications  Medication Sig Dispense Refill   amLODipine (NORVASC) 10 MG tablet Take 10 mg by mouth daily.     amLODipine (NORVASC) 10 MG tablet Take 1 tablet by mouth daily.     aspirin EC 81 MG tablet Take 81 mg by mouth daily.     atorvastatin (LIPITOR) 20 MG tablet Take 20 mg by mouth daily.     cloNIDine (CATAPRES) 0.1 MG tablet Take 0.1 mg by mouth 2 (two) times daily.     Cyanocobalamin  1000 MCG TBCR Take by mouth.     dapagliflozin propanediol (FARXIGA) 10 MG TABS tablet TAKE 10 MG BY MOUTH 1 (ONE) TIME EACH DAY IN THE MORNING     lenalidomide (REVLIMID) 15 MG capsule Take 1 capsule (15 mg total) by mouth daily. Take for 14 days, then hold for 14 days. Repeat every 28 days. 14 capsule 0   levothyroxine (SYNTHROID) 137 MCG tablet Take 1 tablet by mouth daily.      lisinopril (ZESTRIL) 20 MG tablet Take 20 mg by mouth daily.     ONETOUCH DELICA LANCETS 95M MISC Inject 1 Lancet as directed once daily.     pioglitazone (ACTOS) 30 MG tablet Take 30 mg by mouth daily.     potassium chloride SA (K-DUR,KLOR-CON) 20 MEQ tablet Take 20 mEq by mouth 2 (two) times daily.   0   quinapril (ACCUPRIL) 40 MG tablet Take 40 mg by mouth daily.     torsemide (DEMADEX) 20 MG tablet Take 20 mg by mouth daily as needed.     famotidine (PEPCID) 20 MG tablet Take 1 tablet (20 mg total) by mouth 2 (two) times daily. (Patient not taking: Reported on 02/28/2022) 14 tablet 0   No current facility-administered medications for this visit.   Facility-Administered Medications Ordered in Other Visits  Medication Dose Route Frequency Provider Last Rate Last Admin   0.9 %  sodium chloride infusion   Intravenous Continuous Cammie Sickle, MD   Stopped at 12/11/14 1528   zoledronic acid (ZOMETA) 3 mg in sodium chloride 0.9 % 100 mL IVPB  3 mg Intravenous Once Cammie Sickle, MD        PHYSICAL EXAMINATION: ECOG PERFORMANCE STATUS: 0 - Asymptomatic  BP (!) 144/73 (BP Location: Left Arm, Patient Position: Sitting)   Pulse (!) 59   Temp (!) 96.3 F (35.7 C) (Tympanic)   Resp 16   Wt 230 lb (104.3 kg)   BMI 33.97 kg/m   Filed Weights   02/28/22 1300  Weight: 230 lb (104.3 kg)     Physical Exam Vitals and nursing note reviewed.  HENT:     Head: Normocephalic and atraumatic.     Mouth/Throat:     Pharynx: Oropharynx is clear. No oropharyngeal exudate.  Eyes:     Extraocular  Movements: Extraocular movements intact.     Pupils: Pupils are equal, round, and reactive to light.  Cardiovascular:     Rate and Rhythm: Normal rate and regular rhythm.  Pulmonary:     Effort: No respiratory distress.     Breath sounds: No wheezing.     Comments: Decreased breath sounds bilaterally.  Abdominal:     General: Bowel sounds are normal. There is no distension.     Palpations: Abdomen is soft. There is no mass.     Tenderness: There is no abdominal tenderness. There is no guarding or rebound.  Musculoskeletal:        General: No tenderness. Normal range of motion.  Cervical back: Normal range of motion and neck supple.  Skin:    General: Skin is warm.  Neurological:     General: No focal deficit present.     Mental Status: He is alert and oriented to person, place, and time.  Psychiatric:        Mood and Affect: Affect normal.        Behavior: Behavior normal.        Judgment: Judgment normal.       LABORATORY DATA:  I have reviewed the data as listed    Component Value Date/Time   NA 140 02/28/2022 1313   NA 139 05/01/2014 1322   K 4.4 02/28/2022 1313   K 4.4 05/01/2014 1322   CL 107 02/28/2022 1313   CL 107 05/01/2014 1322   CO2 25 02/28/2022 1313   CO2 27 05/01/2014 1322   GLUCOSE 86 02/28/2022 1313   GLUCOSE 102 (H) 05/01/2014 1322   BUN 14 02/28/2022 1313   BUN 16 05/01/2014 1322   CREATININE 1.39 (H) 02/28/2022 1313   CREATININE 1.48 (H) 05/29/2014 0949   CALCIUM 8.7 (L) 02/28/2022 1313   CALCIUM 9.1 05/01/2014 1322   PROT 7.6 02/28/2022 1313   PROT 7.5 05/01/2014 1322   ALBUMIN 3.5 02/28/2022 1313   ALBUMIN 3.6 05/01/2014 1322   AST 20 02/28/2022 1313   AST 17 05/01/2014 1322   ALT 22 02/28/2022 1313   ALT 13 (L) 05/01/2014 1322   ALKPHOS 57 02/28/2022 1313   ALKPHOS 47 05/01/2014 1322   BILITOT 0.6 02/28/2022 1313   BILITOT 0.6 05/01/2014 1322   GFRNONAA 52 (L) 02/28/2022 1313   GFRNONAA 47 (L) 05/29/2014 0949   GFRAA 47 (L)  10/21/2019 1106   GFRAA 55 (L) 05/29/2014 0949    No results found for: "SPEP", "UPEP"  Lab Results  Component Value Date   WBC 4.2 02/28/2022   NEUTROABS 2.2 02/28/2022   HGB 13.4 02/28/2022   HCT 40.0 02/28/2022   MCV 92.4 02/28/2022   PLT 144 (L) 02/28/2022      Chemistry      Component Value Date/Time   NA 140 02/28/2022 1313   NA 139 05/01/2014 1322   K 4.4 02/28/2022 1313   K 4.4 05/01/2014 1322   CL 107 02/28/2022 1313   CL 107 05/01/2014 1322   CO2 25 02/28/2022 1313   CO2 27 05/01/2014 1322   BUN 14 02/28/2022 1313   BUN 16 05/01/2014 1322   CREATININE 1.39 (H) 02/28/2022 1313   CREATININE 1.48 (H) 05/29/2014 0949      Component Value Date/Time   CALCIUM 8.7 (L) 02/28/2022 1313   CALCIUM 9.1 05/01/2014 1322   ALKPHOS 57 02/28/2022 1313   ALKPHOS 47 05/01/2014 1322   AST 20 02/28/2022 1313   AST 17 05/01/2014 1322   ALT 22 02/28/2022 1313   ALT 13 (L) 05/01/2014 1322   BILITOT 0.6 02/28/2022 1313   BILITOT 0.6 05/01/2014 1322       RADIOGRAPHIC STUDIES: I have personally reviewed the radiological images as listed and agreed with the findings in the report. No results found.   ASSESSMENT & PLAN:  Multiple myeloma in remission ([place]) #Multiple myeloma-most recently on maintenance Revlimid 15 mg 2  weeks on 2 week off.  NOV 2023- M-protein- 0.7 [April 2023- NEG]; kappa lambda light chain ratio=2.45 [July 2020- 2.0]. I reviewed with the patient that given the slight increase in the kappa lambda light chain ratio/multiple myeloma will concerning for recurrent  myeloma.  Await labs from today.  If continue to worsen or noticed to have worsening anemia/renal function-would consider bone marrow biopsy x-ray/PET scans.  Understands that he might need alternative therapy if progression of disease is confirmed.  # On Revlimid 15 mg 2 weeks; 2 weeks OFF; Today ANC 2.4 - hemoglobin-13/platelets-144- stable.   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 stable.   #Bone modifying agent/multiple myeloma-- on zometa 3 mg every other month; ca- 9.1 -Proceed with zometa today.  Recommended continued compliance with ca+vit D. Stable.   Disposition: # today ZOMETA- wait for labs # 1 month- cbc/cmp;  # Follow up in 2 months -  MD;  labs [CBC, CMP, kappa lambda light chains, multiple myeloma panel]  and zometa- Dr.B  Addendum: HOLD zometa- as hs calcium is 8.7; please make sure pt is taking ca+vit D; and check vit 25-OH levels at next visit.    Orders Placed This Encounter  Procedures   CBC with Differential/Platelet    Standing Status:   Future    Standing Expiration Date:   03/01/2023   Comprehensive metabolic panel    Standing Status:   Future    Standing Expiration Date:   03/01/2023   CBC with Differential/Platelet    Standing Status:   Future    Standing Expiration Date:   03/01/2023   Comprehensive metabolic panel    Standing Status:   Future    Standing Expiration Date:   03/01/2023   VITAMIN D 25 Hydroxy (Vit-D Deficiency, Fractures)    Standing Status:   Future    Standing Expiration Date:   03/01/2023   Kappa/lambda light chains    Standing Status:   Future    Standing Expiration Date:   03/01/2023   Multiple Myeloma Panel (SPEP&IFE w/QIG)    Standing Status:   Future    Standing Expiration Date:   03/01/2023    All questions were answered. The patient knows to call the clinic with any problems, questions or concerns.      Cammie Sickle, MD 02/28/2022 3:09 PM

## 2022-02-28 NOTE — Progress Notes (Signed)
Per Dr. Rogue Bussing, hold Zometa today due to low serum calcium (8.7).

## 2022-02-28 NOTE — Assessment & Plan Note (Addendum)
#  Multiple myeloma-most recently on maintenance Revlimid 15 mg 2  weeks on 2 week off.  NOV 2023- M-protein- 0.7 [April 2023- NEG]; kappa lambda light chain ratio=2.45 [July 2020- 2.0]. I reviewed with the patient that given the slight increase in the kappa lambda light chain ratio/multiple myeloma will concerning for recurrent myeloma.  Await labs from today.  If continue to worsen or noticed to have worsening anemia/renal function-would consider bone marrow biopsy x-ray/PET scans.  Understands that he might need alternative therapy if progression of disease is confirmed.  # On Revlimid 15 mg 2 weeks; 2 weeks OFF; Today ANC 2.4 - hemoglobin-13/platelets-144- stable.   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 stable.   #Bone modifying agent/multiple myeloma-- on zometa 3 mg every other month; ca- 9.1 -Proceed with zometa today.  Recommended continued compliance with ca+vit D. Stable.   Disposition: # today ZOMETA- wait for labs # 1 month- cbc/cmp;  # Follow up in 2 months -  MD;  labs [CBC, CMP, kappa lambda light chains, multiple myeloma panel]  and zometa- Dr.B  Addendum: HOLD zometa- as hs calcium is 8.7; please make sure pt is taking ca+vit D; and check vit 25-OH levels at next visit.

## 2022-02-28 NOTE — Progress Notes (Signed)
Patient denies new problems/concerns today.    Good appetite with 11 lb wt loss but he has been walking more for exercise.

## 2022-03-01 LAB — KAPPA/LAMBDA LIGHT CHAINS
Kappa free light chain: 110.1 mg/L — ABNORMAL HIGH (ref 3.3–19.4)
Kappa, lambda light chain ratio: 2.46 — ABNORMAL HIGH (ref 0.26–1.65)
Lambda free light chains: 44.8 mg/L — ABNORMAL HIGH (ref 5.7–26.3)

## 2022-03-03 LAB — MULTIPLE MYELOMA PANEL, SERUM
Albumin SerPl Elph-Mcnc: 3.5 g/dL (ref 2.9–4.4)
Albumin/Glob SerPl: 1.1 (ref 0.7–1.7)
Alpha 1: 0.2 g/dL (ref 0.0–0.4)
Alpha2 Glob SerPl Elph-Mcnc: 0.6 g/dL (ref 0.4–1.0)
B-Globulin SerPl Elph-Mcnc: 1.1 g/dL (ref 0.7–1.3)
Gamma Glob SerPl Elph-Mcnc: 1.4 g/dL (ref 0.4–1.8)
Globulin, Total: 3.4 g/dL (ref 2.2–3.9)
IgA: 627 mg/dL — ABNORMAL HIGH (ref 61–437)
IgG (Immunoglobin G), Serum: 1518 mg/dL (ref 603–1613)
IgM (Immunoglobulin M), Srm: 52 mg/dL (ref 15–143)
M Protein SerPl Elph-Mcnc: 0.6 g/dL — ABNORMAL HIGH
Total Protein ELP: 6.9 g/dL (ref 6.0–8.5)

## 2022-03-07 ENCOUNTER — Other Ambulatory Visit: Payer: Self-pay | Admitting: *Deleted

## 2022-03-07 DIAGNOSIS — C9001 Multiple myeloma in remission: Secondary | ICD-10-CM

## 2022-03-07 MED ORDER — LENALIDOMIDE 15 MG PO CAPS: 15.0000 mg | ORAL_CAPSULE | Freq: Every day | ORAL | 0 refills | Status: DC

## 2022-03-20 ENCOUNTER — Ambulatory Visit: Payer: Medicare HMO | Admitting: Internal Medicine

## 2022-03-20 ENCOUNTER — Other Ambulatory Visit: Payer: Medicare HMO

## 2022-03-20 ENCOUNTER — Ambulatory Visit: Payer: Medicare HMO

## 2022-03-31 ENCOUNTER — Other Ambulatory Visit: Payer: Self-pay | Admitting: *Deleted

## 2022-03-31 ENCOUNTER — Inpatient Hospital Stay: Payer: Medicare HMO | Attending: Internal Medicine

## 2022-03-31 DIAGNOSIS — C9001 Multiple myeloma in remission: Secondary | ICD-10-CM

## 2022-03-31 MED ORDER — LENALIDOMIDE 15 MG PO CAPS: 15.0000 mg | ORAL_CAPSULE | Freq: Every day | ORAL | 0 refills | Status: DC

## 2022-05-12 ENCOUNTER — Inpatient Hospital Stay: Payer: Medicare HMO

## 2022-05-12 ENCOUNTER — Encounter: Payer: Self-pay | Admitting: Internal Medicine

## 2022-05-12 ENCOUNTER — Inpatient Hospital Stay: Payer: Medicare HMO | Attending: Internal Medicine | Admitting: Internal Medicine

## 2022-05-12 VITALS — BP 145/79 | HR 59 | Temp 96.7°F | Resp 16 | Wt 229.0 lb

## 2022-05-12 DIAGNOSIS — I129 Hypertensive chronic kidney disease with stage 1 through stage 4 chronic kidney disease, or unspecified chronic kidney disease: Secondary | ICD-10-CM | POA: Diagnosis not present

## 2022-05-12 DIAGNOSIS — C9001 Multiple myeloma in remission: Secondary | ICD-10-CM | POA: Insufficient documentation

## 2022-05-12 DIAGNOSIS — N183 Chronic kidney disease, stage 3 unspecified: Secondary | ICD-10-CM | POA: Insufficient documentation

## 2022-05-12 LAB — CBC WITH DIFFERENTIAL/PLATELET
Abs Immature Granulocytes: 0.02 10*3/uL (ref 0.00–0.07)
Basophils Absolute: 0 10*3/uL (ref 0.0–0.1)
Basophils Relative: 1 %
Eosinophils Absolute: 0.3 10*3/uL (ref 0.0–0.5)
Eosinophils Relative: 7 %
HCT: 41.2 % (ref 39.0–52.0)
Hemoglobin: 13.4 g/dL (ref 13.0–17.0)
Immature Granulocytes: 1 %
Lymphocytes Relative: 22 %
Lymphs Abs: 0.8 10*3/uL (ref 0.7–4.0)
MCH: 31 pg (ref 26.0–34.0)
MCHC: 32.5 g/dL (ref 30.0–36.0)
MCV: 95.4 fL (ref 80.0–100.0)
Monocytes Absolute: 0.4 10*3/uL (ref 0.1–1.0)
Monocytes Relative: 10 %
Neutro Abs: 2.2 10*3/uL (ref 1.7–7.7)
Neutrophils Relative %: 59 %
Platelets: 272 10*3/uL (ref 150–400)
RBC: 4.32 MIL/uL (ref 4.22–5.81)
RDW: 14.8 % (ref 11.5–15.5)
WBC: 3.7 10*3/uL — ABNORMAL LOW (ref 4.0–10.5)
nRBC: 0 % (ref 0.0–0.2)

## 2022-05-12 LAB — COMPREHENSIVE METABOLIC PANEL
ALT: 17 U/L (ref 0–44)
AST: 18 U/L (ref 15–41)
Albumin: 3.9 g/dL (ref 3.5–5.0)
Alkaline Phosphatase: 62 U/L (ref 38–126)
Anion gap: 5 (ref 5–15)
BUN: 17 mg/dL (ref 8–23)
CO2: 24 mmol/L (ref 22–32)
Calcium: 9.2 mg/dL (ref 8.9–10.3)
Chloride: 107 mmol/L (ref 98–111)
Creatinine, Ser: 1.39 mg/dL — ABNORMAL HIGH (ref 0.61–1.24)
GFR, Estimated: 52 mL/min — ABNORMAL LOW (ref >60)
Glucose, Bld: 94 mg/dL (ref 70–99)
Potassium: 4 mmol/L (ref 3.5–5.1)
Sodium: 136 mmol/L (ref 135–145)
Total Bilirubin: 0.5 mg/dL (ref 0.3–1.2)
Total Protein: 7.9 g/dL (ref 6.5–8.1)

## 2022-05-12 LAB — VITAMIN D 25 HYDROXY (VIT D DEFICIENCY, FRACTURES): Vit D, 25-Hydroxy: 37.27 ng/mL (ref 30–100)

## 2022-05-12 MED ORDER — ZOLEDRONIC ACID 4 MG/5ML IV CONC
3.0000 mg | Freq: Once | INTRAVENOUS | Status: AC
  Administered 2022-05-12: 3 mg via INTRAVENOUS
  Filled 2022-05-12: qty 3.75

## 2022-05-12 MED ORDER — SODIUM CHLORIDE 0.9 % IV SOLN
INTRAVENOUS | Status: DC | PRN
  Filled 2022-05-12: qty 250

## 2022-05-12 NOTE — Progress Notes (Signed)
Pt in for follow up, denies any difficulties or concerns today. 

## 2022-05-12 NOTE — Progress Notes (Signed)
[facility] OFFICE PROGRESS NOTE  Patient Care Team: Gracelyn Nurse, MD as PCP - General (Internal Medicine) Cherlyn Roberts, MD as Consulting Physician (Dermatology) Earna Coder, MD as Consulting Physician (Hematology and Oncology)   Cancer Staging  No matching staging information was found for the patient.   Oncology History Overview Note   # April 2014- MULTIPLE MYELOMA [IgG- 2.2gm/dl; 02-23% plasma cell; Cyto-N; FISH- gain of chr.9, 15 & ccnd1/11q13] s/p RVD x6 [sep 2014; BMBx- ~5% plasma cells]; Maint Rev-DEX-Zometa; March 2015-Stopped Dex Cont- Rev-Zometa; July 2016- Rev 25mg ; NOV 4th  2016-HOLD; BMT eval at [facility] [Dr.Woods]; declined BMT.   # Re-START FEB 2017 Rev 15mg  3 W- on & 1 w OFF; HELD Rev June- Oct 2020- sec to worsening renal function [Dr.Singh]; SEP 2021- Rev 15 mg 2 w-On & 2 w-OFF.   # Zometa  # CKD [creat ~1.4; sec MM; Dr.Singh]; DM  -------------------------------------------------------------------------   DIAGNOSIS: [place].Clarity ] MULTIPLE MYELOMA  GOALS: control  CURRENT/MOST RECENT THERAPY- REVLIMID Maintenance [Feb 2017]    Multiple myeloma in remission  10/01/2015 Initial Diagnosis   Multiple myeloma in remission (HCC)     INTERVAL HISTORY: pt is alone. Ambulating independently.   Alan Alexander 77 y.o.  male pleasant patient above history of multiple myeloma  on Revlimid maintenance [2 weeks on 2-week off] is here for follow-up.  Pt in for follow up, denies any difficulties or concerns today.   Patient denies any skin rash.  Denies any diarrhea.  No swelling in the legs.  No nausea vomiting.  Admits to compliance with Revlimid.  Review of Systems  Constitutional:  Negative for chills, diaphoresis, fever, malaise/fatigue and weight loss.  HENT:  Negative for nosebleeds and sore throat.   Eyes:  Negative for double vision.  Respiratory:  Negative for cough, hemoptysis, sputum production, shortness of breath and wheezing.    Cardiovascular:  Positive for leg swelling. Negative for chest pain, palpitations and orthopnea.  Gastrointestinal:  Negative for abdominal pain, blood in stool, constipation, diarrhea, heartburn, melena, nausea and vomiting.  Genitourinary:  Negative for dysuria, frequency and urgency.  Musculoskeletal:  Negative for back pain and joint pain.  Skin: Negative.  Negative for itching and rash.  Neurological:  Negative for dizziness, tingling, focal weakness, weakness and headaches.  Endo/Heme/Allergies:  Does not bruise/bleed easily.  Psychiatric/Behavioral:  Negative for depression. The patient is not nervous/anxious and does not have insomnia.       PAST MEDICAL HISTORY :  Past Medical History:  Diagnosis Date   Diabetes mellitus without complication    GERD (gastroesophageal reflux disease)    Hyperlipidemia    Hypertension    Multiple myeloma    Obesity    Thyroid cancer    Thyroid disease     PAST SURGICAL HISTORY :   Past Surgical History:  Procedure Laterality Date   BONE MARROW BIOPSY  2014   CATARACT EXTRACTION BILATERAL W/ ANTERIOR VITRECTOMY      FAMILY HISTORY :   Family History  Problem Relation Age of Onset   Cancer Father 10   Diabetes Mother     SOCIAL HISTORY:   Social History   Tobacco Use   Smoking status: Never   Smokeless tobacco: Never  Substance Use Topics   Alcohol use: No   Drug use: No    ALLERGIES:  has No Known Allergies.  MEDICATIONS:  Current Outpatient Medications  Medication Sig Dispense Refill   amLODipine (NORVASC) 10 MG tablet Take 10  mg by mouth daily.     aspirin EC 81 MG tablet Take 81 mg by mouth daily.     atorvastatin (LIPITOR) 20 MG tablet Take 20 mg by mouth daily.     cloNIDine (CATAPRES) 0.1 MG tablet Take 0.1 mg by mouth 2 (two) times daily.     Cyanocobalamin 1000 MCG TBCR Take by mouth.     dapagliflozin propanediol (FARXIGA) 10 MG TABS tablet TAKE 10 MG BY MOUTH 1 (ONE) TIME EACH DAY IN THE MORNING      glipiZIDE (GLUCOTROL) 5 MG tablet Take 5 mg by mouth every morning.     lenalidomide (REVLIMID) 15 MG capsule Take 1 capsule (15 mg total) by mouth daily. Take for 14 days, then hold for 14 days. Repeat every 28 days. 14 capsule 0   levothyroxine (SYNTHROID) 137 MCG tablet Take 1 tablet by mouth daily.      lisinopril (ZESTRIL) 20 MG tablet Take 20 mg by mouth daily.     ONETOUCH DELICA LANCETS 33G MISC Inject 1 Lancet as directed once daily.     pioglitazone (ACTOS) 30 MG tablet Take 30 mg by mouth daily.     potassium chloride SA (K-DUR,KLOR-CON) 20 MEQ tablet Take 20 mEq by mouth 2 (two) times daily.   0   quinapril (ACCUPRIL) 40 MG tablet Take 40 mg by mouth daily.     torsemide (DEMADEX) 20 MG tablet Take 20 mg by mouth daily as needed.     famotidine (PEPCID) 20 MG tablet Take 1 tablet (20 mg total) by mouth 2 (two) times daily. (Patient not taking: Reported on 02/28/2022) 14 tablet 0   No current facility-administered medications for this visit.   Facility-Administered Medications Ordered in Other Visits  Medication Dose Route Frequency Provider Last Rate Last Admin   0.9 %  sodium chloride infusion   Intravenous Continuous Earna Coder, MD   Stopped at 12/11/14 1528   0.9 %  sodium chloride infusion   Intravenous PRN Earna Coder, MD       zoledronic acid (ZOMETA) 3 mg in sodium chloride 0.9 % 100 mL IVPB  3 mg Intravenous Once Earna Coder, MD        PHYSICAL EXAMINATION: ECOG PERFORMANCE STATUS: 0 - Asymptomatic  BP (!) 145/79 (BP Location: Left Arm, Patient Position: Sitting)   Pulse (!) 59   Temp (!) 96.7 F (35.9 C) (Tympanic)   Resp 16   Wt 229 lb (103.9 kg)   SpO2 99%   BMI 33.82 kg/m   Filed Weights   05/12/22 1349  Weight: 229 lb (103.9 kg)      Physical Exam Vitals and nursing note reviewed.  HENT:     Head: Normocephalic and atraumatic.     Mouth/Throat:     Pharynx: Oropharynx is clear. No oropharyngeal exudate.  Eyes:      Extraocular Movements: Extraocular movements intact.     Pupils: Pupils are equal, round, and reactive to light.  Cardiovascular:     Rate and Rhythm: Normal rate and regular rhythm.  Pulmonary:     Effort: No respiratory distress.     Breath sounds: No wheezing.     Comments: Decreased breath sounds bilaterally.  Abdominal:     General: Bowel sounds are normal. There is no distension.     Palpations: Abdomen is soft. There is no mass.     Tenderness: There is no abdominal tenderness. There is no guarding or rebound.  Musculoskeletal:  General: No tenderness. Normal range of motion.     Cervical back: Normal range of motion and neck supple.  Skin:    General: Skin is warm.  Neurological:     General: No focal deficit present.     Mental Status: He is alert and oriented to person, place, and time.  Psychiatric:        Mood and Affect: Affect normal.        Behavior: Behavior normal.        Judgment: Judgment normal.       LABORATORY DATA:  I have reviewed the data as listed    Component Value Date/Time   NA 136 05/12/2022 1324   NA 139 05/01/2014 1322   K 4.0 05/12/2022 1324   K 4.4 05/01/2014 1322   CL 107 05/12/2022 1324   CL 107 05/01/2014 1322   CO2 24 05/12/2022 1324   CO2 27 05/01/2014 1322   GLUCOSE 94 05/12/2022 1324   GLUCOSE 102 (H) 05/01/2014 1322   BUN 17 05/12/2022 1324   BUN 16 05/01/2014 1322   CREATININE 1.39 (H) 05/12/2022 1324   CREATININE 1.48 (H) 05/29/2014 0949   CALCIUM 9.2 05/12/2022 1324   CALCIUM 9.1 05/01/2014 1322   PROT 7.9 05/12/2022 1324   PROT 7.5 05/01/2014 1322   ALBUMIN 3.9 05/12/2022 1324   ALBUMIN 3.6 05/01/2014 1322   AST 18 05/12/2022 1324   AST 17 05/01/2014 1322   ALT 17 05/12/2022 1324   ALT 13 (L) 05/01/2014 1322   ALKPHOS 62 05/12/2022 1324   ALKPHOS 47 05/01/2014 1322   BILITOT 0.5 05/12/2022 1324   BILITOT 0.6 05/01/2014 1322   GFRNONAA 52 (L) 05/12/2022 1324   GFRNONAA 47 (L) 05/29/2014 0949   GFRAA 47  (L) 10/21/2019 1106   GFRAA 55 (L) 05/29/2014 0949    No results found for: "SPEP", "UPEP"  Lab Results  Component Value Date   WBC 3.7 (L) 05/12/2022   NEUTROABS 2.2 05/12/2022   HGB 13.4 05/12/2022   HCT 41.2 05/12/2022   MCV 95.4 05/12/2022   PLT 272 05/12/2022      Chemistry      Component Value Date/Time   NA 136 05/12/2022 1324   NA 139 05/01/2014 1322   K 4.0 05/12/2022 1324   K 4.4 05/01/2014 1322   CL 107 05/12/2022 1324   CL 107 05/01/2014 1322   CO2 24 05/12/2022 1324   CO2 27 05/01/2014 1322   BUN 17 05/12/2022 1324   BUN 16 05/01/2014 1322   CREATININE 1.39 (H) 05/12/2022 1324   CREATININE 1.48 (H) 05/29/2014 0949      Component Value Date/Time   CALCIUM 9.2 05/12/2022 1324   CALCIUM 9.1 05/01/2014 1322   ALKPHOS 62 05/12/2022 1324   ALKPHOS 47 05/01/2014 1322   AST 18 05/12/2022 1324   AST 17 05/01/2014 1322   ALT 17 05/12/2022 1324   ALT 13 (L) 05/01/2014 1322   BILITOT 0.5 05/12/2022 1324   BILITOT 0.6 05/01/2014 1322       RADIOGRAPHIC STUDIES: I have personally reviewed the radiological images as listed and agreed with the findings in the report. No results found.   ASSESSMENT & PLAN:  Multiple myeloma in remission (HCC) #Multiple myeloma-most recently on maintenance Revlimid 15 mg 2  weeks on 2 week off.  NOV 2023- M-protein- 0.7 [April 2023- NEG]; kappa lambda light chain ratio=2.45 [July 2020- 2.0]. I reviewed with the patient that given the slight increase in the kappa  lambda light chain ratio/multiple myeloma will concerning for recurrent myeloma.  Await labs from today.  If continue to worsen or noticed to have worsening anemia/renal function-would consider bone marrow biopsy x-ray/PET scans.  Understands that he might need alternative therapy if progression of disease is confirmed.  # On Revlimid 15 mg 2 weeks; 2 weeks OFF; Today ANC 2.4 - hemoglobin-13/platelets-144- stable.   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 stable.   #Bone modifying agent/multiple myeloma-- on zometa 3 mg every other month; ca- 9.1 -Proceed with zometa today.  Recommended continued compliance with ca+vit D. Stable.   Disposition: # today ZOMETA- wait for labs # 1 month- cbc/cmp;  # Follow up in 2 months -  MD;  labs [CBC, CMP, kappa lambda light chains, multiple myeloma panel]  and zometa- Dr.B  Addendum: HOLD zometa- as hs calcium is 8.7; please make sure pt is taking ca+vit D; and check vit 25-OH levels at next visit.    Orders Placed This Encounter  Procedures   CBC with Differential (Cancer Center Only)    Standing Status:   Future    Standing Expiration Date:   05/08/2023   CMP (Cancer Center only)    Standing Status:   Future    Standing Expiration Date:   05/12/2023   CBC with Differential (Cancer Center Only)    Standing Status:   Future    Standing Expiration Date:   05/09/2023   CMP (Cancer Center only)    Standing Status:   Future    Standing Expiration Date:   05/12/2023   Kappa/lambda light chains    Standing Status:   Future    Standing Expiration Date:   05/12/2023   Multiple Myeloma Panel (SPEP&IFE w/QIG)    Standing Status:   Future    Standing Expiration Date:   05/12/2023    All questions were answered. The patient knows to call the clinic with any problems, questions or concerns.      Earna Coder, MD 05/12/2022 2:17 PM

## 2022-05-12 NOTE — Assessment & Plan Note (Addendum)
#  Multiple myeloma-most recently on maintenance Revlimid 15 mg 2  weeks on 2 week off.  JAN 2024- M-protein- 0.7 [April 2023- NEG]; kappa=110; lambda light chain ratio=2.75 [July 2020- 2.0].   # I reviewed with the patient that given the slight increase in the kappa lambda light chain ratio/multiple myeloma will concerning for recurrent myeloma.  Await labs from today.  If continue to worsen or noticed to have worsening anemia/renal function-would consider bone marrow biopsy x-ray/PET scans.  Understands that he might need alternative therapy if progression of disease is confirmed.  # On Revlimid 15 mg 2 weeks; 2 weeks OFF; Today ANC 2.4 - hemoglobin-13/platelets-144- stable.   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 stable.   #Bone modifying agent/multiple myeloma-- on zometa 3 mg every other month; ca- 9.1 -Proceed with zometa today.  Recommended continued compliance with ca+vit D. stable.   # Zometa q3M- 05/12/23  Disposition: # today ZOMETA-  # 1 month- labs- cbc/cmp;  # Follow up in 2 months -  MD;  labs [CBC, CMP, kappa lambda light chains, multiple myeloma panel] - Dr.B

## 2022-05-15 LAB — KAPPA/LAMBDA LIGHT CHAINS
Kappa free light chain: 100.5 mg/L — ABNORMAL HIGH (ref 3.3–19.4)
Kappa, lambda light chain ratio: 2.34 — ABNORMAL HIGH (ref 0.26–1.65)
Lambda free light chains: 42.9 mg/L — ABNORMAL HIGH (ref 5.7–26.3)

## 2022-05-17 LAB — MULTIPLE MYELOMA PANEL, SERUM
Albumin SerPl Elph-Mcnc: 3.7 g/dL (ref 2.9–4.4)
Albumin/Glob SerPl: 1.1 (ref 0.7–1.7)
Alpha 1: 0.2 g/dL (ref 0.0–0.4)
Alpha2 Glob SerPl Elph-Mcnc: 0.6 g/dL (ref 0.4–1.0)
B-Globulin SerPl Elph-Mcnc: 1.2 g/dL (ref 0.7–1.3)
Gamma Glob SerPl Elph-Mcnc: 1.4 g/dL (ref 0.4–1.8)
Globulin, Total: 3.5 g/dL (ref 2.2–3.9)
IgA: 657 mg/dL — ABNORMAL HIGH (ref 61–437)
IgG (Immunoglobin G), Serum: 1603 mg/dL (ref 603–1613)
IgM (Immunoglobulin M), Srm: 47 mg/dL (ref 15–143)
M Protein SerPl Elph-Mcnc: 0.5 g/dL — ABNORMAL HIGH
Total Protein ELP: 7.2 g/dL (ref 6.0–8.5)

## 2022-05-29 ENCOUNTER — Other Ambulatory Visit: Payer: Self-pay | Admitting: *Deleted

## 2022-05-29 DIAGNOSIS — C9001 Multiple myeloma in remission: Secondary | ICD-10-CM

## 2022-05-29 MED ORDER — LENALIDOMIDE 15 MG PO CAPS
15.0000 mg | ORAL_CAPSULE | Freq: Every day | ORAL | 0 refills | Status: DC
Start: 2022-05-29 — End: 2022-06-26

## 2022-06-12 ENCOUNTER — Inpatient Hospital Stay: Payer: Medicare HMO | Attending: Internal Medicine

## 2022-06-12 DIAGNOSIS — C9001 Multiple myeloma in remission: Secondary | ICD-10-CM | POA: Insufficient documentation

## 2022-06-12 LAB — CMP (CANCER CENTER ONLY)
ALT: 15 U/L (ref 0–44)
AST: 17 U/L (ref 15–41)
Albumin: 3.6 g/dL (ref 3.5–5.0)
Alkaline Phosphatase: 51 U/L (ref 38–126)
Anion gap: 7 (ref 5–15)
BUN: 16 mg/dL (ref 8–23)
CO2: 23 mmol/L (ref 22–32)
Calcium: 8.5 mg/dL — ABNORMAL LOW (ref 8.9–10.3)
Chloride: 108 mmol/L (ref 98–111)
Creatinine: 1.47 mg/dL — ABNORMAL HIGH (ref 0.61–1.24)
GFR, Estimated: 49 mL/min — ABNORMAL LOW (ref >60)
Glucose, Bld: 96 mg/dL (ref 70–99)
Potassium: 4.5 mmol/L (ref 3.5–5.1)
Sodium: 138 mmol/L (ref 135–145)
Total Bilirubin: 0.6 mg/dL (ref 0.3–1.2)
Total Protein: 7.2 g/dL (ref 6.5–8.1)

## 2022-06-12 LAB — CBC WITH DIFFERENTIAL (CANCER CENTER ONLY)
Abs Immature Granulocytes: 0.02 10*3/uL (ref 0.00–0.07)
Basophils Absolute: 0 10*3/uL (ref 0.0–0.1)
Basophils Relative: 1 %
Eosinophils Absolute: 0.1 10*3/uL (ref 0.0–0.5)
Eosinophils Relative: 4 %
HCT: 40.1 % (ref 39.0–52.0)
Hemoglobin: 12.7 g/dL — ABNORMAL LOW (ref 13.0–17.0)
Immature Granulocytes: 1 %
Lymphocytes Relative: 23 %
Lymphs Abs: 0.7 10*3/uL (ref 0.7–4.0)
MCH: 30.4 pg (ref 26.0–34.0)
MCHC: 31.7 g/dL (ref 30.0–36.0)
MCV: 95.9 fL (ref 80.0–100.0)
Monocytes Absolute: 0.3 10*3/uL (ref 0.1–1.0)
Monocytes Relative: 10 %
Neutro Abs: 1.9 10*3/uL (ref 1.7–7.7)
Neutrophils Relative %: 61 %
Platelet Count: 229 10*3/uL (ref 150–400)
RBC: 4.18 MIL/uL — ABNORMAL LOW (ref 4.22–5.81)
RDW: 14.1 % (ref 11.5–15.5)
WBC Count: 3.1 10*3/uL — ABNORMAL LOW (ref 4.0–10.5)
nRBC: 0 % (ref 0.0–0.2)

## 2022-06-26 ENCOUNTER — Telehealth: Payer: Self-pay | Admitting: *Deleted

## 2022-06-26 DIAGNOSIS — C9001 Multiple myeloma in remission: Secondary | ICD-10-CM

## 2022-06-26 MED ORDER — LENALIDOMIDE 15 MG PO CAPS
15.0000 mg | ORAL_CAPSULE | Freq: Every day | ORAL | 0 refills | Status: DC
Start: 2022-06-26 — End: 2022-07-24

## 2022-06-26 NOTE — Telephone Encounter (Signed)
Refill to [facility] for revlimid

## 2022-07-12 ENCOUNTER — Encounter: Payer: Self-pay | Admitting: Internal Medicine

## 2022-07-12 ENCOUNTER — Inpatient Hospital Stay (HOSPITAL_BASED_OUTPATIENT_CLINIC_OR_DEPARTMENT_OTHER): Payer: Medicare HMO | Admitting: Internal Medicine

## 2022-07-12 ENCOUNTER — Inpatient Hospital Stay: Payer: Medicare HMO | Attending: Internal Medicine

## 2022-07-12 VITALS — BP 140/66 | HR 64 | Temp 97.1°F | Ht 69.0 in | Wt 230.8 lb

## 2022-07-12 DIAGNOSIS — N183 Chronic kidney disease, stage 3 unspecified: Secondary | ICD-10-CM | POA: Insufficient documentation

## 2022-07-12 DIAGNOSIS — C9001 Multiple myeloma in remission: Secondary | ICD-10-CM | POA: Diagnosis present

## 2022-07-12 DIAGNOSIS — I129 Hypertensive chronic kidney disease with stage 1 through stage 4 chronic kidney disease, or unspecified chronic kidney disease: Secondary | ICD-10-CM | POA: Diagnosis not present

## 2022-07-12 LAB — CBC WITH DIFFERENTIAL (CANCER CENTER ONLY)
Abs Immature Granulocytes: 0.03 10*3/uL (ref 0.00–0.07)
Basophils Absolute: 0 10*3/uL (ref 0.0–0.1)
Basophils Relative: 1 %
Eosinophils Absolute: 0.2 10*3/uL (ref 0.0–0.5)
Eosinophils Relative: 5 %
HCT: 39.4 % (ref 39.0–52.0)
Hemoglobin: 12.6 g/dL — ABNORMAL LOW (ref 13.0–17.0)
Immature Granulocytes: 1 %
Lymphocytes Relative: 21 %
Lymphs Abs: 0.7 10*3/uL (ref 0.7–4.0)
MCH: 30.1 pg (ref 26.0–34.0)
MCHC: 32 g/dL (ref 30.0–36.0)
MCV: 94.3 fL (ref 80.0–100.0)
Monocytes Absolute: 0.3 10*3/uL (ref 0.1–1.0)
Monocytes Relative: 10 %
Neutro Abs: 2.1 10*3/uL (ref 1.7–7.7)
Neutrophils Relative %: 62 %
Platelet Count: 229 10*3/uL (ref 150–400)
RBC: 4.18 MIL/uL — ABNORMAL LOW (ref 4.22–5.81)
RDW: 14.5 % (ref 11.5–15.5)
WBC Count: 3.4 10*3/uL — ABNORMAL LOW (ref 4.0–10.5)
nRBC: 0 % (ref 0.0–0.2)

## 2022-07-12 LAB — CMP (CANCER CENTER ONLY)
ALT: 17 U/L (ref 0–44)
AST: 17 U/L (ref 15–41)
Albumin: 3.5 g/dL (ref 3.5–5.0)
Alkaline Phosphatase: 53 U/L (ref 38–126)
Anion gap: 7 (ref 5–15)
BUN: 20 mg/dL (ref 8–23)
CO2: 21 mmol/L — ABNORMAL LOW (ref 22–32)
Calcium: 8.6 mg/dL — ABNORMAL LOW (ref 8.9–10.3)
Chloride: 110 mmol/L (ref 98–111)
Creatinine: 1.46 mg/dL — ABNORMAL HIGH (ref 0.61–1.24)
GFR, Estimated: 49 mL/min — ABNORMAL LOW (ref >60)
Glucose, Bld: 123 mg/dL — ABNORMAL HIGH (ref 70–99)
Potassium: 4.3 mmol/L (ref 3.5–5.1)
Sodium: 138 mmol/L (ref 135–145)
Total Bilirubin: 0.5 mg/dL (ref 0.3–1.2)
Total Protein: 7.4 g/dL (ref 6.5–8.1)

## 2022-07-12 NOTE — Progress Notes (Signed)
No concerns today 

## 2022-07-12 NOTE — Progress Notes (Signed)
[facility] OFFICE PROGRESS NOTE  Patient Care Team: Gracelyn Nurse, MD as PCP - General (Internal Medicine) Cherlyn Roberts, MD as Consulting Physician (Dermatology) Earna Coder, MD as Consulting Physician (Hematology and Oncology)   Cancer Staging  No matching staging information was found for the patient.   Oncology History Overview Note   # April 2014- MULTIPLE MYELOMA [IgG- 2.2gm/dl; 54-09% plasma cell; Cyto-N; FISH- gain of chr.9, 15 & ccnd1/11q13] s/p RVD x6 [sep 2014; BMBx- ~5% plasma cells]; Maint Rev-DEX-Zometa; March 2015-Stopped Dex Cont- Rev-Zometa; July 2016- Rev 25mg ; NOV 4th  2016-HOLD; BMT eval at [facility] [Dr.Woods]; declined BMT.   # Re-START FEB 2017 Rev 15mg  3 W- on & 1 w OFF; HELD Rev June- Oct 2020- sec to worsening renal function [Dr.Singh]; SEP 2021- Rev 15 mg 2 w-On & 2 w-OFF.   # Zometa  # CKD [creat ~1.4; sec MM; Dr.Singh]; DM  -------------------------------------------------------------------------   DIAGNOSIS: Alan Alexander ] MULTIPLE MYELOMA  GOALS: control  CURRENT/MOST RECENT THERAPY- REVLIMID Maintenance [Feb 2017]    Multiple myeloma in remission (HCC)  10/01/2015 Initial Diagnosis   Multiple myeloma in remission (HCC)     INTERVAL HISTORY: pt is alone. Ambulating independently.   Alan Alexander 77 y.o.  male pleasant patient above history of multiple myeloma  on Revlimid maintenance [2 weeks on 2-week off] is here for follow-up.  Pt in for follow up, denies any difficulties or concerns today.   Patient denies any skin rash.  Denies any diarrhea.  No swelling in the legs.  No nausea vomiting.  Admits to compliance with Revlimid.  Review of Systems  Constitutional:  Negative for chills, diaphoresis, fever, malaise/fatigue and weight loss.  HENT:  Negative for nosebleeds and sore throat.   Eyes:  Negative for double vision.  Respiratory:  Negative for cough, hemoptysis, sputum production, shortness of breath and wheezing.    Cardiovascular:  Positive for leg swelling. Negative for chest pain, palpitations and orthopnea.  Gastrointestinal:  Negative for abdominal pain, blood in stool, constipation, diarrhea, heartburn, melena, nausea and vomiting.  Genitourinary:  Negative for dysuria, frequency and urgency.  Musculoskeletal:  Negative for back pain and joint pain.  Skin: Negative.  Negative for itching and rash.  Neurological:  Negative for dizziness, tingling, focal weakness, weakness and headaches.  Endo/Heme/Allergies:  Does not bruise/bleed easily.  Psychiatric/Behavioral:  Negative for depression. The patient is not nervous/anxious and does not have insomnia.       PAST MEDICAL HISTORY :  Past Medical History:  Diagnosis Date   Diabetes mellitus without complication (HCC)    GERD (gastroesophageal reflux disease)    Hyperlipidemia    Hypertension    Multiple myeloma (HCC)    Obesity    Thyroid cancer (HCC)    Thyroid disease     PAST SURGICAL HISTORY :   Past Surgical History:  Procedure Laterality Date   BONE MARROW BIOPSY  2014   CATARACT EXTRACTION BILATERAL W/ ANTERIOR VITRECTOMY      FAMILY HISTORY :   Family History  Problem Relation Age of Onset   Cancer Father 6   Diabetes Mother     SOCIAL HISTORY:   Social History   Tobacco Use   Smoking status: Never   Smokeless tobacco: Never  Substance Use Topics   Alcohol use: No   Drug use: No    ALLERGIES:  has No Known Allergies.  MEDICATIONS:  Current Outpatient Medications  Medication Sig Dispense Refill   amLODipine (NORVASC) 10  MG tablet Take 10 mg by mouth daily.     aspirin EC 81 MG tablet Take 81 mg by mouth daily.     atorvastatin (LIPITOR) 20 MG tablet Take 20 mg by mouth daily.     cloNIDine (CATAPRES) 0.1 MG tablet Take 0.1 mg by mouth 2 (two) times daily.     Cyanocobalamin 1000 MCG TBCR Take by mouth.     dapagliflozin propanediol (FARXIGA) 10 MG TABS tablet TAKE 10 MG BY MOUTH 1 (ONE) TIME EACH DAY IN THE  MORNING     glipiZIDE (GLUCOTROL) 5 MG tablet Take 5 mg by mouth every morning.     lenalidomide (REVLIMID) 15 MG capsule Take 1 capsule (15 mg total) by mouth daily. Take for 14 days, then hold for 14 days. Repeat every 28 days. 14 capsule 0   levothyroxine (SYNTHROID) 137 MCG tablet Take 1 tablet by mouth daily.      lisinopril (ZESTRIL) 20 MG tablet Take 20 mg by mouth daily.     ONETOUCH DELICA LANCETS 33G MISC Inject 1 Lancet as directed once daily.     pioglitazone (ACTOS) 30 MG tablet Take 30 mg by mouth daily.     potassium chloride SA (K-DUR,KLOR-CON) 20 MEQ tablet Take 20 mEq by mouth 2 (two) times daily.   0   quinapril (ACCUPRIL) 40 MG tablet Take 40 mg by mouth daily.     torsemide (DEMADEX) 20 MG tablet Take 20 mg by mouth daily as needed.     famotidine (PEPCID) 20 MG tablet Take 1 tablet (20 mg total) by mouth 2 (two) times daily. (Patient not taking: Reported on 02/28/2022) 14 tablet 0   No current facility-administered medications for this visit.   Facility-Administered Medications Ordered in Other Visits  Medication Dose Route Frequency Provider Last Rate Last Admin   0.9 %  sodium chloride infusion   Intravenous Continuous Earna Coder, MD   Stopped at 12/11/14 1528    PHYSICAL EXAMINATION: ECOG PERFORMANCE STATUS: 0 - Asymptomatic  BP (!) 140/66 (BP Location: Right Arm, Patient Position: Sitting, Cuff Size: Large)   Pulse 64   Temp (!) 97.1 F (36.2 C) (Tympanic)   Ht 5\' 9"  (1.753 m)   Wt 230 lb 12.8 oz (104.7 kg)   SpO2 97%   BMI 34.08 kg/m   Filed Weights   07/12/22 1504  Weight: 230 lb 12.8 oz (104.7 kg)       Physical Exam Vitals and nursing note reviewed.  HENT:     Head: Normocephalic and atraumatic.     Mouth/Throat:     Pharynx: Oropharynx is clear. No oropharyngeal exudate.  Eyes:     Extraocular Movements: Extraocular movements intact.     Pupils: Pupils are equal, round, and reactive to light.  Cardiovascular:     Rate and  Rhythm: Normal rate and regular rhythm.  Pulmonary:     Effort: No respiratory distress.     Breath sounds: No wheezing.     Comments: Decreased breath sounds bilaterally.  Abdominal:     General: Bowel sounds are normal. There is no distension.     Palpations: Abdomen is soft. There is no mass.     Tenderness: There is no abdominal tenderness. There is no guarding or rebound.  Musculoskeletal:        General: No tenderness. Normal range of motion.     Cervical back: Normal range of motion and neck supple.  Skin:    General: Skin is warm.  Neurological:     General: No focal deficit present.     Mental Status: He is alert and oriented to person, place, and time.  Psychiatric:        Mood and Affect: Affect normal.        Behavior: Behavior normal.        Judgment: Judgment normal.       LABORATORY DATA:  I have reviewed the data as listed    Component Value Date/Time   NA 138 07/12/2022 1448   NA 139 05/01/2014 1322   K 4.3 07/12/2022 1448   K 4.4 05/01/2014 1322   CL 110 07/12/2022 1448   CL 107 05/01/2014 1322   CO2 21 (L) 07/12/2022 1448   CO2 27 05/01/2014 1322   GLUCOSE 123 (H) 07/12/2022 1448   GLUCOSE 102 (H) 05/01/2014 1322   BUN 20 07/12/2022 1448   BUN 16 05/01/2014 1322   CREATININE 1.46 (H) 07/12/2022 1448   CREATININE 1.48 (H) 05/29/2014 0949   CALCIUM 8.6 (L) 07/12/2022 1448   CALCIUM 9.1 05/01/2014 1322   PROT 7.4 07/12/2022 1448   PROT 7.5 05/01/2014 1322   ALBUMIN 3.5 07/12/2022 1448   ALBUMIN 3.6 05/01/2014 1322   AST 17 07/12/2022 1448   ALT 17 07/12/2022 1448   ALT 13 (L) 05/01/2014 1322   ALKPHOS 53 07/12/2022 1448   ALKPHOS 47 05/01/2014 1322   BILITOT 0.5 07/12/2022 1448   GFRNONAA 49 (L) 07/12/2022 1448   GFRNONAA 47 (L) 05/29/2014 0949   GFRAA 47 (L) 10/21/2019 1106   GFRAA 55 (L) 05/29/2014 0949    No results found for: "SPEP", "UPEP"  Lab Results  Component Value Date   WBC 3.4 (L) 07/12/2022   NEUTROABS 2.1 07/12/2022    HGB 12.6 (L) 07/12/2022   HCT 39.4 07/12/2022   MCV 94.3 07/12/2022   PLT 229 07/12/2022      Chemistry      Component Value Date/Time   NA 138 07/12/2022 1448   NA 139 05/01/2014 1322   K 4.3 07/12/2022 1448   K 4.4 05/01/2014 1322   CL 110 07/12/2022 1448   CL 107 05/01/2014 1322   CO2 21 (L) 07/12/2022 1448   CO2 27 05/01/2014 1322   BUN 20 07/12/2022 1448   BUN 16 05/01/2014 1322   CREATININE 1.46 (H) 07/12/2022 1448   CREATININE 1.48 (H) 05/29/2014 0949      Component Value Date/Time   CALCIUM 8.6 (L) 07/12/2022 1448   CALCIUM 9.1 05/01/2014 1322   ALKPHOS 53 07/12/2022 1448   ALKPHOS 47 05/01/2014 1322   AST 17 07/12/2022 1448   ALT 17 07/12/2022 1448   ALT 13 (L) 05/01/2014 1322   BILITOT 0.5 07/12/2022 1448       RADIOGRAPHIC STUDIES: I have personally reviewed the radiological images as listed and agreed with the findings in the report. No results found.   ASSESSMENT & PLAN:  Multiple myeloma in remission (HCC) #Multiple myeloma-most recently on maintenance Revlimid 15 mg 2  weeks on 2 week off.  MARCH  2024- M-protein- 0.5  [April 2023- NEG]; kappa=100; lambda light chain ratio=2.75 [July 2020- 2.0].   # I reviewed with the patient that given the slight increase in the kappa lambda light chain ratio/multiple myeloma will concerning for recurrent myeloma.  Await labs from today.    # If continue to worsen or noticed to have worsening anemia/renal function-would consider bone marrow biopsy x-ray/PET scans.  Understands that he might need alternative  therapy if progression of disease is confirmed.  # On Revlimid 15 mg 2 weeks; 2 weeks OFF; Today ANC 2.4 - hemoglobin-12.6 platelets-144- check iron studies; ferritin; stable.   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 stable.   #Bone modifying agent/multiple myeloma-- on zometa 3 mg every other month; ca- 9.1 -Proceed with zometa today.  Recommended continued compliance with ca+vit D. stable.    # Zometa q 66M- 05/12/23  Disposition: # 1 month- labs- cbc/cmp;  # Follow up in 2 months -  MD;  labs [facility], CMP, kappa lambda light chains, multiple myeloma panel; iron studies; ferritin]; Zometa - Dr.B   Orders Placed This Encounter  Procedures   CBC with Differential (Cancer Center Only)    Standing Status:   Future    Standing Expiration Date:   07/12/2023   CMP (Cancer Center only)    Standing Status:   Future    Standing Expiration Date:   07/12/2023   Kappa/lambda light chains    Standing Status:   Future    Standing Expiration Date:   07/12/2023   Multiple Myeloma Panel (SPEP&IFE w/QIG)    Standing Status:   Future    Standing Expiration Date:   07/12/2023   Iron and TIBC    Standing Status:   Future    Standing Expiration Date:   07/12/2023   Ferritin    Standing Status:   Future    Standing Expiration Date:   07/12/2023    All questions were answered. The patient knows to call the clinic with any problems, questions or concerns.      Earna Coder, MD 07/12/2022 3:59 PM

## 2022-07-12 NOTE — Assessment & Plan Note (Addendum)
#  Multiple myeloma-most recently on maintenance Revlimid 15 mg 2  weeks on 2 week off.  MARCH  2024- M-protein- 0.5  [April 2023- NEG]; kappa=100; lambda light chain ratio=2.75 [July 2020- 2.0].   # I reviewed with the patient that given the slight increase in the kappa lambda light chain ratio/multiple myeloma will concerning for recurrent myeloma.  Await labs from today.    # If continue to worsen or noticed to have worsening anemia/renal function-would consider bone marrow biopsy x-ray/PET scans.  Understands that he might need alternative therapy if progression of disease is confirmed.  # On Revlimid 15 mg 2 weeks; 2 weeks OFF; Today ANC 2.4 - hemoglobin-12.6 platelets-144- check iron studies; ferritin; stable.   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 stable.   #Bone modifying agent/multiple myeloma-- on zometa 3 mg every other month; ca- 9.1 -Proceed with zometa today.  Recommended continued compliance with ca+vit D. stable.   # Zometa q 72M- 05/12/23  Disposition: # 1 month- labs- cbc/cmp;  # Follow up in 2 months -  MD;  labs [facility], CMP, kappa lambda light chains, multiple myeloma panel; iron studies; ferritin]; Zometa - Dr.B

## 2022-07-13 LAB — KAPPA/LAMBDA LIGHT CHAINS
Kappa free light chain: 112.4 mg/L — ABNORMAL HIGH (ref 3.3–19.4)
Kappa, lambda light chain ratio: 2.51 — ABNORMAL HIGH (ref 0.26–1.65)
Lambda free light chains: 44.7 mg/L — ABNORMAL HIGH (ref 5.7–26.3)

## 2022-07-18 LAB — MULTIPLE MYELOMA PANEL, SERUM
Albumin SerPl Elph-Mcnc: 3.2 g/dL (ref 2.9–4.4)
Albumin/Glob SerPl: 1 (ref 0.7–1.7)
Alpha 1: 0.3 g/dL (ref 0.0–0.4)
Alpha2 Glob SerPl Elph-Mcnc: 0.6 g/dL (ref 0.4–1.0)
B-Globulin SerPl Elph-Mcnc: 1.2 g/dL (ref 0.7–1.3)
Gamma Glob SerPl Elph-Mcnc: 1.4 g/dL (ref 0.4–1.8)
Globulin, Total: 3.5 g/dL (ref 2.2–3.9)
IgA: 542 mg/dL — ABNORMAL HIGH (ref 61–437)
IgG (Immunoglobin G), Serum: 1519 mg/dL (ref 603–1613)
IgM (Immunoglobulin M), Srm: 38 mg/dL (ref 15–143)
M Protein SerPl Elph-Mcnc: 0.6 g/dL — ABNORMAL HIGH
Total Protein ELP: 6.7 g/dL (ref 6.0–8.5)

## 2022-07-24 ENCOUNTER — Other Ambulatory Visit: Payer: Self-pay | Admitting: *Deleted

## 2022-07-24 DIAGNOSIS — C9001 Multiple myeloma in remission: Secondary | ICD-10-CM

## 2022-07-24 MED ORDER — LENALIDOMIDE 15 MG PO CAPS
15.0000 mg | ORAL_CAPSULE | Freq: Every day | ORAL | 0 refills | Status: DC
Start: 2022-07-24 — End: 2022-08-30

## 2022-08-14 ENCOUNTER — Inpatient Hospital Stay: Payer: Medicare HMO | Attending: Internal Medicine

## 2022-08-14 DIAGNOSIS — C9001 Multiple myeloma in remission: Secondary | ICD-10-CM | POA: Diagnosis present

## 2022-08-14 LAB — CBC WITH DIFFERENTIAL/PLATELET
Abs Immature Granulocytes: 0.02 10*3/uL (ref 0.00–0.07)
Basophils Absolute: 0 10*3/uL (ref 0.0–0.1)
Basophils Relative: 0 %
Eosinophils Absolute: 0.2 10*3/uL (ref 0.0–0.5)
Eosinophils Relative: 5 %
HCT: 40.2 % (ref 39.0–52.0)
Hemoglobin: 12.9 g/dL — ABNORMAL LOW (ref 13.0–17.0)
Immature Granulocytes: 1 %
Lymphocytes Relative: 19 %
Lymphs Abs: 0.7 10*3/uL (ref 0.7–4.0)
MCH: 30.4 pg (ref 26.0–34.0)
MCHC: 32.1 g/dL (ref 30.0–36.0)
MCV: 94.8 fL (ref 80.0–100.0)
Monocytes Absolute: 0.7 10*3/uL (ref 0.1–1.0)
Monocytes Relative: 19 %
Neutro Abs: 2.2 10*3/uL (ref 1.7–7.7)
Neutrophils Relative %: 56 %
Platelets: 191 10*3/uL (ref 150–400)
RBC: 4.24 MIL/uL (ref 4.22–5.81)
RDW: 15.2 % (ref 11.5–15.5)
WBC: 3.8 10*3/uL — ABNORMAL LOW (ref 4.0–10.5)
nRBC: 0 % (ref 0.0–0.2)

## 2022-08-14 LAB — COMPREHENSIVE METABOLIC PANEL
ALT: 19 U/L (ref 0–44)
AST: 17 U/L (ref 15–41)
Albumin: 3.6 g/dL (ref 3.5–5.0)
Alkaline Phosphatase: 56 U/L (ref 38–126)
Anion gap: 4 — ABNORMAL LOW (ref 5–15)
BUN: 17 mg/dL (ref 8–23)
CO2: 24 mmol/L (ref 22–32)
Calcium: 8.8 mg/dL — ABNORMAL LOW (ref 8.9–10.3)
Chloride: 110 mmol/L (ref 98–111)
Creatinine, Ser: 1.38 mg/dL — ABNORMAL HIGH (ref 0.61–1.24)
GFR, Estimated: 52 mL/min — ABNORMAL LOW (ref >60)
Glucose, Bld: 127 mg/dL — ABNORMAL HIGH (ref 70–99)
Potassium: 4.1 mmol/L (ref 3.5–5.1)
Sodium: 138 mmol/L (ref 135–145)
Total Bilirubin: 0.4 mg/dL (ref 0.3–1.2)
Total Protein: 7.3 g/dL (ref 6.5–8.1)

## 2022-08-30 ENCOUNTER — Telehealth: Payer: Self-pay | Admitting: *Deleted

## 2022-08-30 DIAGNOSIS — C9001 Multiple myeloma in remission: Secondary | ICD-10-CM

## 2022-08-30 MED ORDER — LENALIDOMIDE 15 MG PO CAPS
15.0000 mg | ORAL_CAPSULE | Freq: Every day | ORAL | 0 refills | Status: DC
Start: 2022-08-30 — End: 2022-10-16

## 2022-08-30 NOTE — Telephone Encounter (Signed)
Revlimid refill with [facility] number.

## 2022-09-11 ENCOUNTER — Inpatient Hospital Stay: Payer: Medicare HMO | Admitting: Internal Medicine

## 2022-09-11 ENCOUNTER — Inpatient Hospital Stay: Payer: Medicare HMO

## 2022-09-11 ENCOUNTER — Encounter: Payer: Self-pay | Admitting: Internal Medicine

## 2022-09-11 ENCOUNTER — Inpatient Hospital Stay: Payer: Medicare HMO | Attending: Internal Medicine

## 2022-09-11 DIAGNOSIS — N183 Chronic kidney disease, stage 3 unspecified: Secondary | ICD-10-CM | POA: Insufficient documentation

## 2022-09-11 DIAGNOSIS — C9001 Multiple myeloma in remission: Secondary | ICD-10-CM | POA: Diagnosis present

## 2022-09-11 LAB — CMP (CANCER CENTER ONLY)
ALT: 20 U/L (ref 0–44)
AST: 18 U/L (ref 15–41)
Albumin: 3.6 g/dL (ref 3.5–5.0)
Alkaline Phosphatase: 57 U/L (ref 38–126)
Anion gap: 8 (ref 5–15)
BUN: 16 mg/dL (ref 8–23)
CO2: 20 mmol/L — ABNORMAL LOW (ref 22–32)
Calcium: 8.4 mg/dL — ABNORMAL LOW (ref 8.9–10.3)
Chloride: 108 mmol/L (ref 98–111)
Creatinine: 1.56 mg/dL — ABNORMAL HIGH (ref 0.61–1.24)
GFR, Estimated: 45 mL/min — ABNORMAL LOW (ref >60)
Glucose, Bld: 152 mg/dL — ABNORMAL HIGH (ref 70–99)
Potassium: 3.9 mmol/L (ref 3.5–5.1)
Sodium: 136 mmol/L (ref 135–145)
Total Bilirubin: 0.4 mg/dL (ref 0.3–1.2)
Total Protein: 7.3 g/dL (ref 6.5–8.1)

## 2022-09-11 LAB — CBC WITH DIFFERENTIAL (CANCER CENTER ONLY)
Abs Immature Granulocytes: 0.03 10*3/uL (ref 0.00–0.07)
Basophils Absolute: 0 10*3/uL (ref 0.0–0.1)
Basophils Relative: 1 %
Eosinophils Absolute: 0.2 10*3/uL (ref 0.0–0.5)
Eosinophils Relative: 5 %
HCT: 40.2 % (ref 39.0–52.0)
Hemoglobin: 13.1 g/dL (ref 13.0–17.0)
Immature Granulocytes: 1 %
Lymphocytes Relative: 19 %
Lymphs Abs: 0.8 10*3/uL (ref 0.7–4.0)
MCH: 30.6 pg (ref 26.0–34.0)
MCHC: 32.6 g/dL (ref 30.0–36.0)
MCV: 93.9 fL (ref 80.0–100.0)
Monocytes Absolute: 0.7 10*3/uL (ref 0.1–1.0)
Monocytes Relative: 18 %
Neutro Abs: 2.3 10*3/uL (ref 1.7–7.7)
Neutrophils Relative %: 56 %
Platelet Count: 192 10*3/uL (ref 150–400)
RBC: 4.28 MIL/uL (ref 4.22–5.81)
RDW: 14.8 % (ref 11.5–15.5)
WBC Count: 4 10*3/uL (ref 4.0–10.5)
nRBC: 0 % (ref 0.0–0.2)

## 2022-09-11 LAB — IRON AND TIBC
Iron: 58 ug/dL (ref 45–182)
Saturation Ratios: 18 % (ref 17.9–39.5)
TIBC: 328 ug/dL (ref 250–450)
UIBC: 270 ug/dL

## 2022-09-11 LAB — FERRITIN: Ferritin: 36 ng/mL (ref 24–336)

## 2022-09-11 MED ORDER — ERGOCALCIFEROL 1.25 MG (50000 UT) PO CAPS: 50000.0000 [IU] | ORAL_CAPSULE | ORAL | 1 refills | Status: AC

## 2022-09-11 NOTE — Assessment & Plan Note (Addendum)
#  Multiple myeloma-most recently on maintenance Revlimid 15 mg 2  weeks on 2 week off.  MARCH  2024- M-protein- 0.6  [April 2023- NEG]; kappa=112 lambda light chain ratio=2.5 [July 2020- 2.0].   # I reviewed with the patient that given the slight increase in the kappa lambda light chain ratio/multiple myeloma will concerning for recurrent myeloma.  Await labs from today.    # If continue to worsen or noticed to have worsening anemia/renal function-would consider bone marrow biopsy x-ray/PET scans.  Understands that he might need alternative therapy if progression of disease is confirmed.  # On Revlimid 15 mg 2 weeks; 2 weeks OFF; Today ANC 2.4 - hemoglobin-12.6 platelets-144- check iron studies; ferritin; stable.   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 stable. April 2024- Vit D 38- recommend Vit D 50,K/weekly.   #Bone modifying agent/multiple myeloma-- on zometa 3 mg every other month; ca- 9.1 with zometa today.  Start vit D as above.   # Zometa q 21M- 05/12/23  Disposition: # HOLD Zometa   # 1 month- labs- cbc/cmp;   # Follow up in 2 months -  MD; 1 week prior- labs [CBC, CMP, kappa lambda light chains, multiple myeloma panel; iron studies; ferritin; 25-OH levels] possible   Zometa - Dr.B

## 2022-09-11 NOTE — Progress Notes (Signed)
[facility] OFFICE PROGRESS NOTE  Patient Care Team: Gracelyn Nurse, MD as PCP - General (Internal Medicine) Cherlyn Roberts, MD as Consulting Physician (Dermatology) Earna Coder, MD as Consulting Physician (Hematology and Oncology)   Cancer Staging  No matching staging information was found for the patient.    Oncology History Overview Note   # April 2014- MULTIPLE MYELOMA [IgG- 2.2gm/dl; 69-62% plasma cell; Cyto-N; FISH- gain of chr.9, 15 & ccnd1/11q13] s/p RVD x6 [sep 2014; BMBx- ~5% plasma cells]; Maint Rev-DEX-Zometa; March 2015-Stopped Dex Cont- Rev-Zometa; July 2016- Rev 25mg ; NOV 4th  2016-HOLD; BMT eval at [facility] [Dr.Woods]; declined BMT.   # Re-START FEB 2017 Rev 15mg  3 W- on & 1 w OFF; HELD Rev June- Oct 2020- sec to worsening renal function [Dr.Singh]; SEP 2021- Rev 15 mg 2 w-On & 2 w-OFF.   # Zometa  # CKD [creat ~1.4; sec MM; Dr.Singh]; DM  -------------------------------------------------------------------------   DIAGNOSIS: [place]orette.Clarity ] MULTIPLE MYELOMA  GOALS: control  CURRENT/MOST RECENT THERAPY- REVLIMID Maintenance [Feb 2017]    Multiple myeloma in remission (HCC)  10/01/2015 Initial Diagnosis   Multiple myeloma in remission (HCC)     INTERVAL HISTORY: pt is alone. Ambulating independently.   Alan Alexander 77 y.o.  male pleasant patient above history of multiple myeloma  on Revlimid maintenance [2 weeks on 2-week off] is here for follow-up.  Pt in for follow up, denies any difficulties or concerns today.   Patient denies any skin rash.  Denies any diarrhea.  No swelling in the legs.  No nausea vomiting.  Admits to compliance with Revlimid.  Review of Systems  Constitutional:  Negative for chills, diaphoresis, fever, malaise/fatigue and weight loss.  HENT:  Negative for nosebleeds and sore throat.   Eyes:  Negative for double vision.  Respiratory:  Negative for cough, hemoptysis, sputum production, shortness of breath and wheezing.    Cardiovascular:  Positive for leg swelling. Negative for chest pain, palpitations and orthopnea.  Gastrointestinal:  Negative for abdominal pain, blood in stool, constipation, diarrhea, heartburn, melena, nausea and vomiting.  Genitourinary:  Negative for dysuria, frequency and urgency.  Musculoskeletal:  Negative for back pain and joint pain.  Skin: Negative.  Negative for itching and rash.  Neurological:  Negative for dizziness, tingling, focal weakness, weakness and headaches.  Endo/Heme/Allergies:  Does not bruise/bleed easily.  Psychiatric/Behavioral:  Negative for depression. The patient is not nervous/anxious and does not have insomnia.       PAST MEDICAL HISTORY :  Past Medical History:  Diagnosis Date   Diabetes mellitus without complication (HCC)    GERD (gastroesophageal reflux disease)    Hyperlipidemia    Hypertension    Multiple myeloma (HCC)    Obesity    Thyroid cancer (HCC)    Thyroid disease     PAST SURGICAL HISTORY :   Past Surgical History:  Procedure Laterality Date   BONE MARROW BIOPSY  2014   CATARACT EXTRACTION BILATERAL W/ ANTERIOR VITRECTOMY      FAMILY HISTORY :   Family History  Problem Relation Age of Onset   Cancer Father 58   Diabetes Mother     SOCIAL HISTORY:   Social History   Tobacco Use   Smoking status: Never   Smokeless tobacco: Never  Substance Use Topics   Alcohol use: No   Drug use: No    ALLERGIES:  has No Known Allergies.  MEDICATIONS:  Current Outpatient Medications  Medication Sig Dispense Refill   amLODipine (NORVASC)  10 MG tablet Take 10 mg by mouth daily.     aspirin EC 81 MG tablet Take 81 mg by mouth daily.     atorvastatin (LIPITOR) 20 MG tablet Take 20 mg by mouth daily.     cloNIDine (CATAPRES) 0.1 MG tablet Take 0.1 mg by mouth 2 (two) times daily.     Cyanocobalamin 1000 MCG TBCR Take by mouth.     dapagliflozin propanediol (FARXIGA) 10 MG TABS tablet TAKE 10 MG BY MOUTH 1 (ONE) TIME EACH DAY IN THE  MORNING     ergocalciferol (VITAMIN D2) 1.25 MG (50000 UT) capsule Take 1 capsule (50,000 Units total) by mouth once a week. 12 capsule 1   glipiZIDE (GLUCOTROL) 5 MG tablet Take 5 mg by mouth every morning.     lenalidomide (REVLIMID) 15 MG capsule Take 1 capsule (15 mg total) by mouth daily. Take for 14 days, then hold for 14 days. Repeat every 28 days. 14 capsule 0   levothyroxine (SYNTHROID) 137 MCG tablet Take 1 tablet by mouth daily.      lisinopril (ZESTRIL) 20 MG tablet Take 20 mg by mouth daily.     ONETOUCH DELICA LANCETS 33G MISC Inject 1 Lancet as directed once daily.     pioglitazone (ACTOS) 30 MG tablet Take 30 mg by mouth daily.     potassium chloride SA (K-DUR,KLOR-CON) 20 MEQ tablet Take 20 mEq by mouth 2 (two) times daily.   0   quinapril (ACCUPRIL) 40 MG tablet Take 40 mg by mouth daily.     torsemide (DEMADEX) 20 MG tablet Take 20 mg by mouth daily as needed.     famotidine (PEPCID) 20 MG tablet Take 1 tablet (20 mg total) by mouth 2 (two) times daily. (Patient not taking: Reported on 02/28/2022) 14 tablet 0   No current facility-administered medications for this visit.   Facility-Administered Medications Ordered in Other Visits  Medication Dose Route Frequency Provider Last Rate Last Admin   0.9 %  sodium chloride infusion   Intravenous Continuous Earna Coder, MD   Stopped at 12/11/14 1528    PHYSICAL EXAMINATION: ECOG PERFORMANCE STATUS: 0 - Asymptomatic  BP 123/76 (BP Location: Left Arm, Patient Position: Sitting, Cuff Size: Large)   Pulse 64   Temp 98 F (36.7 C) (Tympanic)   Ht 5\' 9"  (1.753 m)   Wt 231 lb 6.4 oz (105 kg)   SpO2 98%   BMI 34.17 kg/m   Filed Weights   09/11/22 1246  Weight: 231 lb 6.4 oz (105 kg)       Physical Exam Vitals and nursing note reviewed.  HENT:     Head: Normocephalic and atraumatic.     Mouth/Throat:     Pharynx: Oropharynx is clear. No oropharyngeal exudate.  Eyes:     Extraocular Movements: Extraocular  movements intact.     Pupils: Pupils are equal, round, and reactive to light.  Cardiovascular:     Rate and Rhythm: Normal rate and regular rhythm.  Pulmonary:     Effort: No respiratory distress.     Breath sounds: No wheezing.     Comments: Decreased breath sounds bilaterally.  Abdominal:     General: Bowel sounds are normal. There is no distension.     Palpations: Abdomen is soft. There is no mass.     Tenderness: There is no abdominal tenderness. There is no guarding or rebound.  Musculoskeletal:        General: No tenderness. Normal range of motion.  Cervical back: Normal range of motion and neck supple.  Skin:    General: Skin is warm.  Neurological:     General: No focal deficit present.     Mental Status: He is alert and oriented to person, place, and time.  Psychiatric:        Mood and Affect: Affect normal.        Behavior: Behavior normal.        Judgment: Judgment normal.       LABORATORY DATA:  I have reviewed the data as listed    Component Value Date/Time   NA 136 09/11/2022 1244   NA 139 05/01/2014 1322   K 3.9 09/11/2022 1244   K 4.4 05/01/2014 1322   CL 108 09/11/2022 1244   CL 107 05/01/2014 1322   CO2 20 (L) 09/11/2022 1244   CO2 27 05/01/2014 1322   GLUCOSE 152 (H) 09/11/2022 1244   GLUCOSE 102 (H) 05/01/2014 1322   BUN 16 09/11/2022 1244   BUN 16 05/01/2014 1322   CREATININE 1.56 (H) 09/11/2022 1244   CREATININE 1.48 (H) 05/29/2014 0949   CALCIUM 8.4 (L) 09/11/2022 1244   CALCIUM 9.1 05/01/2014 1322   PROT 7.3 09/11/2022 1244   PROT 7.5 05/01/2014 1322   ALBUMIN 3.6 09/11/2022 1244   ALBUMIN 3.6 05/01/2014 1322   AST 18 09/11/2022 1244   ALT 20 09/11/2022 1244   ALT 13 (L) 05/01/2014 1322   ALKPHOS 57 09/11/2022 1244   ALKPHOS 47 05/01/2014 1322   BILITOT 0.4 09/11/2022 1244   GFRNONAA 45 (L) 09/11/2022 1244   GFRNONAA 47 (L) 05/29/2014 0949   GFRAA 47 (L) 10/21/2019 1106   GFRAA 55 (L) 05/29/2014 0949    No results found for:  "SPEP", "UPEP"  Lab Results  Component Value Date   WBC 4.0 09/11/2022   NEUTROABS 2.3 09/11/2022   HGB 13.1 09/11/2022   HCT 40.2 09/11/2022   MCV 93.9 09/11/2022   PLT 192 09/11/2022      Chemistry      Component Value Date/Time   NA 136 09/11/2022 1244   NA 139 05/01/2014 1322   K 3.9 09/11/2022 1244   K 4.4 05/01/2014 1322   CL 108 09/11/2022 1244   CL 107 05/01/2014 1322   CO2 20 (L) 09/11/2022 1244   CO2 27 05/01/2014 1322   BUN 16 09/11/2022 1244   BUN 16 05/01/2014 1322   CREATININE 1.56 (H) 09/11/2022 1244   CREATININE 1.48 (H) 05/29/2014 0949      Component Value Date/Time   CALCIUM 8.4 (L) 09/11/2022 1244   CALCIUM 9.1 05/01/2014 1322   ALKPHOS 57 09/11/2022 1244   ALKPHOS 47 05/01/2014 1322   AST 18 09/11/2022 1244   ALT 20 09/11/2022 1244   ALT 13 (L) 05/01/2014 1322   BILITOT 0.4 09/11/2022 1244       RADIOGRAPHIC STUDIES: I have personally reviewed the radiological images as listed and agreed with the findings in the report. No results found.   ASSESSMENT & PLAN:  Multiple myeloma in remission (HCC) #Multiple myeloma-most recently on maintenance Revlimid 15 mg 2  weeks on 2 week off.  MARCH  2024- M-protein- 0.6  [April 2023- NEG]; kappa=112 lambda light chain ratio=2.5 [July 2020- 2.0].   # I reviewed with the patient that given the slight increase in the kappa lambda light chain ratio/multiple myeloma will concerning for recurrent myeloma.  Await labs from today.    # If continue to worsen or noticed to  have worsening anemia/renal function-would consider bone marrow biopsy x-ray/PET scans.  Understands that he might need alternative therapy if progression of disease is confirmed.  # On Revlimid 15 mg 2 weeks; 2 weeks OFF; Today ANC 2.4 - hemoglobin-12.6 platelets-144- check iron studies; ferritin; stable.   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 stable. April 2024- Vit D 38- recommend Vit D 50,K/weekly.   #Bone modifying  agent/multiple myeloma-- on zometa 3 mg every other month; ca- 9.1 with zometa today.  Start vit D as above.   # Zometa q 64M- 05/12/23  Disposition: # HOLD Zometa   # 1 month- labs- cbc/cmp;   # Follow up in 2 months -  MD; 1 week prior- labs [CBC, CMP, kappa lambda light chains, multiple myeloma panel; iron studies; ferritin; 25-OH levels] possible   Zometa - Dr.B    No orders of the defined types were placed in this encounter.   All questions were answered. The patient knows to call the clinic with any problems, questions or concerns.      Earna Coder, MD 09/11/2022 1:25 PM

## 2022-09-11 NOTE — Progress Notes (Signed)
No concerns today 

## 2022-09-16 DIAGNOSIS — R600 Localized edema: Secondary | ICD-10-CM | POA: Insufficient documentation

## 2022-10-12 ENCOUNTER — Inpatient Hospital Stay: Payer: Medicare HMO | Attending: Internal Medicine

## 2022-10-12 DIAGNOSIS — N183 Chronic kidney disease, stage 3 unspecified: Secondary | ICD-10-CM | POA: Insufficient documentation

## 2022-10-12 DIAGNOSIS — C9001 Multiple myeloma in remission: Secondary | ICD-10-CM | POA: Insufficient documentation

## 2022-10-16 ENCOUNTER — Telehealth: Payer: Self-pay | Admitting: *Deleted

## 2022-10-16 ENCOUNTER — Other Ambulatory Visit: Payer: Self-pay | Admitting: *Deleted

## 2022-10-16 DIAGNOSIS — C9001 Multiple myeloma in remission: Secondary | ICD-10-CM

## 2022-10-16 MED ORDER — LENALIDOMIDE 15 MG PO CAPS
15.0000 mg | ORAL_CAPSULE | Freq: Every day | ORAL | 0 refills | Status: DC
Start: 2022-10-16 — End: 2022-11-13

## 2022-10-16 NOTE — Telephone Encounter (Signed)
Revlimid refill sent to [facility].

## 2022-11-03 ENCOUNTER — Inpatient Hospital Stay: Payer: Medicare HMO

## 2022-11-03 DIAGNOSIS — N183 Chronic kidney disease, stage 3 unspecified: Secondary | ICD-10-CM | POA: Diagnosis not present

## 2022-11-03 DIAGNOSIS — C9001 Multiple myeloma in remission: Secondary | ICD-10-CM

## 2022-11-03 LAB — IRON AND TIBC
Iron: 69 ug/dL (ref 45–182)
Saturation Ratios: 21 % (ref 17.9–39.5)
TIBC: 336 ug/dL (ref 250–450)
UIBC: 267 ug/dL

## 2022-11-03 LAB — CMP (CANCER CENTER ONLY)
ALT: 25 U/L (ref 0–44)
AST: 23 U/L (ref 15–41)
Albumin: 3.6 g/dL (ref 3.5–5.0)
Alkaline Phosphatase: 61 U/L (ref 38–126)
Anion gap: 7 (ref 5–15)
BUN: 14 mg/dL (ref 8–23)
CO2: 22 mmol/L (ref 22–32)
Calcium: 8.9 mg/dL (ref 8.9–10.3)
Chloride: 107 mmol/L (ref 98–111)
Creatinine: 1.29 mg/dL — ABNORMAL HIGH (ref 0.61–1.24)
GFR, Estimated: 57 mL/min — ABNORMAL LOW (ref >60)
Glucose, Bld: 146 mg/dL — ABNORMAL HIGH (ref 70–99)
Potassium: 3.7 mmol/L (ref 3.5–5.1)
Sodium: 136 mmol/L (ref 135–145)
Total Bilirubin: 0.9 mg/dL (ref 0.3–1.2)
Total Protein: 7.7 g/dL (ref 6.5–8.1)

## 2022-11-03 LAB — CBC WITH DIFFERENTIAL (CANCER CENTER ONLY)
Abs Immature Granulocytes: 0.03 10*3/uL (ref 0.00–0.07)
Basophils Absolute: 0 10*3/uL (ref 0.0–0.1)
Basophils Relative: 1 %
Eosinophils Absolute: 0.2 10*3/uL (ref 0.0–0.5)
Eosinophils Relative: 5 %
HCT: 42.8 % (ref 39.0–52.0)
Hemoglobin: 13.9 g/dL (ref 13.0–17.0)
Immature Granulocytes: 1 %
Lymphocytes Relative: 19 %
Lymphs Abs: 0.8 10*3/uL (ref 0.7–4.0)
MCH: 30.7 pg (ref 26.0–34.0)
MCHC: 32.5 g/dL (ref 30.0–36.0)
MCV: 94.5 fL (ref 80.0–100.0)
Monocytes Absolute: 0.6 10*3/uL (ref 0.1–1.0)
Monocytes Relative: 16 %
Neutro Abs: 2.4 10*3/uL (ref 1.7–7.7)
Neutrophils Relative %: 58 %
Platelet Count: 246 10*3/uL (ref 150–400)
RBC: 4.53 MIL/uL (ref 4.22–5.81)
RDW: 14.3 % (ref 11.5–15.5)
WBC Count: 4.1 10*3/uL (ref 4.0–10.5)
nRBC: 0 % (ref 0.0–0.2)

## 2022-11-03 LAB — FERRITIN: Ferritin: 40 ng/mL (ref 24–336)

## 2022-11-03 LAB — VITAMIN D 25 HYDROXY (VIT D DEFICIENCY, FRACTURES): Vit D, 25-Hydroxy: 57.32 ng/mL (ref 30–100)

## 2022-11-06 LAB — KAPPA/LAMBDA LIGHT CHAINS
Kappa free light chain: 105.2 mg/L — ABNORMAL HIGH (ref 3.3–19.4)
Kappa, lambda light chain ratio: 2.19 — ABNORMAL HIGH (ref 0.26–1.65)
Lambda free light chains: 48 mg/L — ABNORMAL HIGH (ref 5.7–26.3)

## 2022-11-07 LAB — MULTIPLE MYELOMA PANEL, SERUM
Albumin SerPl Elph-Mcnc: 3.5 g/dL (ref 2.9–4.4)
Albumin/Glob SerPl: 1 (ref 0.7–1.7)
Alpha 1: 0.2 g/dL (ref 0.0–0.4)
Alpha2 Glob SerPl Elph-Mcnc: 0.6 g/dL (ref 0.4–1.0)
B-Globulin SerPl Elph-Mcnc: 1.3 g/dL (ref 0.7–1.3)
Gamma Glob SerPl Elph-Mcnc: 1.6 g/dL (ref 0.4–1.8)
Globulin, Total: 3.8 g/dL (ref 2.2–3.9)
IgA: 635 mg/dL — ABNORMAL HIGH (ref 61–437)
IgG (Immunoglobin G), Serum: 1920 mg/dL — ABNORMAL HIGH (ref 603–1613)
IgM (Immunoglobulin M), Srm: 42 mg/dL (ref 15–143)
M Protein SerPl Elph-Mcnc: 0.8 g/dL — ABNORMAL HIGH
Total Protein ELP: 7.3 g/dL (ref 6.0–8.5)

## 2022-11-10 ENCOUNTER — Encounter: Payer: Self-pay | Admitting: Internal Medicine

## 2022-11-10 ENCOUNTER — Inpatient Hospital Stay: Payer: Medicare HMO | Attending: Internal Medicine | Admitting: Internal Medicine

## 2022-11-10 ENCOUNTER — Inpatient Hospital Stay: Payer: Medicare HMO

## 2022-11-10 VITALS — BP 154/69 | HR 60 | Temp 96.0°F

## 2022-11-10 DIAGNOSIS — C9001 Multiple myeloma in remission: Secondary | ICD-10-CM | POA: Diagnosis not present

## 2022-11-10 DIAGNOSIS — N183 Chronic kidney disease, stage 3 unspecified: Secondary | ICD-10-CM | POA: Insufficient documentation

## 2022-11-10 MED ORDER — SODIUM CHLORIDE 0.9 % IV SOLN
INTRAVENOUS | Status: DC | PRN
  Filled 2022-11-10: qty 250

## 2022-11-10 MED ORDER — ZOLEDRONIC ACID 4 MG/5ML IV CONC
3.0000 mg | Freq: Once | INTRAVENOUS | Status: AC
  Administered 2022-11-10: 3 mg via INTRAVENOUS
  Filled 2022-11-10: qty 3.75

## 2022-11-10 NOTE — Progress Notes (Signed)
 No questions/concerns today.

## 2022-11-10 NOTE — Progress Notes (Signed)
[facility] OFFICE PROGRESS NOTE  Patient Care Team: Gracelyn Nurse, MD as PCP - General (Internal Medicine) Cherlyn Roberts, MD as Consulting Physician (Dermatology) Earna Coder, MD as Consulting Physician (Hematology and Oncology)   Cancer Staging  No matching staging information was found for the patient.    Oncology History Overview Note   # April 2014- MULTIPLE MYELOMA [IgG- 2.2gm/dl; 81-19% plasma cell; Cyto-N; FISH- gain of chr.9, 15 & ccnd1/11q13] s/p RVD x6 [sep 2014; BMBx- ~5% plasma cells]; Maint Rev-DEX-Zometa; March 2015-Stopped Dex Cont- Rev-Zometa; July 2016- Rev 25mg ; NOV 4th  2016-HOLD; BMT eval at [facility] [Dr.Woods]; declined BMT.   # Re-START FEB 2017 Rev 15mg  3 W- on & 1 w OFF; HELD Rev June- Oct 2020- sec to worsening renal function [Dr.Singh]; SEP 2021- Rev 15 mg 2 w-On & 2 w-OFF.   # Zometa  # CKD [creat ~1.4; sec MM; Dr.Singh]; DM  -------------------------------------------------------------------------   DIAGNOSIS: [place]te.Clarity ] MULTIPLE MYELOMA  GOALS: control  CURRENT/MOST RECENT THERAPY- REVLIMID Maintenance [Feb 2017]    Multiple myeloma in remission (HCC)  10/01/2015 Initial Diagnosis   Multiple myeloma in remission (HCC)    INTERVAL HISTORY: pt is alone. Ambulating independently.   Alan Alexander 77 y.o.  male pleasant patient above history of multiple myeloma  on Revlimid maintenance [2 weeks on 2-week off] is here for follow-up.  Pt in for follow up, denies any difficulties or concerns today.   Patient denies any skin rash.  Denies any diarrhea.  No swelling in the legs.  No nausea vomiting.  Admits to compliance with Revlimid.  Review of Systems  Constitutional:  Negative for chills, diaphoresis, fever, malaise/fatigue and weight loss.  HENT:  Negative for nosebleeds and sore throat.   Eyes:  Negative for double vision.  Respiratory:  Negative for cough, hemoptysis, sputum production, shortness of breath and wheezing.    Cardiovascular:  Positive for leg swelling. Negative for chest pain, palpitations and orthopnea.  Gastrointestinal:  Negative for abdominal pain, blood in stool, constipation, diarrhea, heartburn, melena, nausea and vomiting.  Genitourinary:  Negative for dysuria, frequency and urgency.  Musculoskeletal:  Negative for back pain and joint pain.  Skin: Negative.  Negative for itching and rash.  Neurological:  Negative for dizziness, tingling, focal weakness, weakness and headaches.  Endo/Heme/Allergies:  Does not bruise/bleed easily.  Psychiatric/Behavioral:  Negative for depression. The patient is not nervous/anxious and does not have insomnia.       PAST MEDICAL HISTORY :  Past Medical History:  Diagnosis Date   Diabetes mellitus without complication (HCC)    GERD (gastroesophageal reflux disease)    Hyperlipidemia    Hypertension    Multiple myeloma (HCC)    Obesity    Thyroid cancer (HCC)    Thyroid disease     PAST SURGICAL HISTORY :   Past Surgical History:  Procedure Laterality Date   BONE MARROW BIOPSY  2014   CATARACT EXTRACTION BILATERAL W/ ANTERIOR VITRECTOMY      FAMILY HISTORY :   Family History  Problem Relation Age of Onset   Cancer Father 79   Diabetes Mother     SOCIAL HISTORY:   Social History   Tobacco Use   Smoking status: Never   Smokeless tobacco: Never  Substance Use Topics   Alcohol use: No   Drug use: No    ALLERGIES:  has No Known Allergies.  MEDICATIONS:  Current Outpatient Medications  Medication Sig Dispense Refill   amLODipine (NORVASC) 10  MG tablet Take 10 mg by mouth daily.     aspirin EC 81 MG tablet Take 81 mg by mouth daily.     atorvastatin (LIPITOR) 20 MG tablet Take 20 mg by mouth daily.     cloNIDine (CATAPRES) 0.1 MG tablet Take 0.1 mg by mouth 2 (two) times daily.     Cyanocobalamin 1000 MCG TBCR Take by mouth.     dapagliflozin propanediol (FARXIGA) 10 MG TABS tablet TAKE 10 MG BY MOUTH 1 (ONE) TIME EACH DAY IN THE  MORNING     ergocalciferol (VITAMIN D2) 1.25 MG (50000 UT) capsule Take 1 capsule (50,000 Units total) by mouth once a week. 12 capsule 1   famotidine (PEPCID) 20 MG tablet Take 1 tablet (20 mg total) by mouth 2 (two) times daily. (Patient not taking: Reported on 02/28/2022) 14 tablet 0   glipiZIDE (GLUCOTROL) 5 MG tablet Take 5 mg by mouth every morning.     lenalidomide (REVLIMID) 15 MG capsule Take 1 capsule (15 mg total) by mouth daily. Take for 14 days, then hold for 14 days. Repeat every 28 days. 14 capsule 0   levothyroxine (SYNTHROID) 137 MCG tablet Take 1 tablet by mouth daily.      lisinopril (ZESTRIL) 20 MG tablet Take 20 mg by mouth daily.     ONETOUCH DELICA LANCETS 33G MISC Inject 1 Lancet as directed once daily.     pioglitazone (ACTOS) 30 MG tablet Take 30 mg by mouth daily.     potassium chloride SA (K-DUR,KLOR-CON) 20 MEQ tablet Take 20 mEq by mouth 2 (two) times daily.   0   quinapril (ACCUPRIL) 40 MG tablet Take 40 mg by mouth daily.     torsemide (DEMADEX) 20 MG tablet Take 20 mg by mouth daily as needed.     No current facility-administered medications for this visit.   Facility-Administered Medications Ordered in Other Visits  Medication Dose Route Frequency Provider Last Rate Last Admin   0.9 %  sodium chloride infusion   Intravenous Continuous Earna Coder, MD   Stopped at 12/11/14 1528   0.9 %  sodium chloride infusion   Intravenous PRN Earna Coder, MD   Stopped at 11/10/22 1106    PHYSICAL EXAMINATION: ECOG PERFORMANCE STATUS: 0 - Asymptomatic  BP (!) 157/81 (BP Location: Left Arm, Patient Position: Sitting, Cuff Size: Large)   Pulse (!) 52   Temp (!) 96.9 F (36.1 C) (Tympanic)   Ht 5\' 9"  (1.753 m)   Wt 227 lb 12.8 oz (103.3 kg)   SpO2 100%   BMI 33.64 kg/m   Filed Weights   11/10/22 0912  Weight: 227 lb 12.8 oz (103.3 kg)       Physical Exam Vitals and nursing note reviewed.  HENT:     Head: Normocephalic and atraumatic.      Mouth/Throat:     Pharynx: Oropharynx is clear. No oropharyngeal exudate.  Eyes:     Extraocular Movements: Extraocular movements intact.     Pupils: Pupils are equal, round, and reactive to light.  Cardiovascular:     Rate and Rhythm: Normal rate and regular rhythm.  Pulmonary:     Effort: No respiratory distress.     Breath sounds: No wheezing.     Comments: Decreased breath sounds bilaterally.  Abdominal:     General: Bowel sounds are normal. There is no distension.     Palpations: Abdomen is soft. There is no mass.     Tenderness: There is no  abdominal tenderness. There is no guarding or rebound.  Musculoskeletal:        General: No tenderness. Normal range of motion.     Cervical back: Normal range of motion and neck supple.  Skin:    General: Skin is warm.  Neurological:     General: No focal deficit present.     Mental Status: He is alert and oriented to person, place, and time.  Psychiatric:        Mood and Affect: Affect normal.        Behavior: Behavior normal.        Judgment: Judgment normal.       LABORATORY DATA:  I have reviewed the data as listed    Component Value Date/Time   NA 136 11/03/2022 0821   NA 139 05/01/2014 1322   K 3.7 11/03/2022 0821   K 4.4 05/01/2014 1322   CL 107 11/03/2022 0821   CL 107 05/01/2014 1322   CO2 22 11/03/2022 0821   CO2 27 05/01/2014 1322   GLUCOSE 146 (H) 11/03/2022 0821   GLUCOSE 102 (H) 05/01/2014 1322   BUN 14 11/03/2022 0821   BUN 16 05/01/2014 1322   CREATININE 1.29 (H) 11/03/2022 0821   CREATININE 1.48 (H) 05/29/2014 0949   CALCIUM 8.9 11/03/2022 0821   CALCIUM 9.1 05/01/2014 1322   PROT 7.7 11/03/2022 0821   PROT 7.5 05/01/2014 1322   ALBUMIN 3.6 11/03/2022 0821   ALBUMIN 3.6 05/01/2014 1322   AST 23 11/03/2022 0821   ALT 25 11/03/2022 0821   ALT 13 (L) 05/01/2014 1322   ALKPHOS 61 11/03/2022 0821   ALKPHOS 47 05/01/2014 1322   BILITOT 0.9 11/03/2022 0821   GFRNONAA 57 (L) 11/03/2022 0821   GFRNONAA  47 (L) 05/29/2014 0949   GFRAA 47 (L) 10/21/2019 1106   GFRAA 55 (L) 05/29/2014 0949    No results found for: "SPEP", "UPEP"  Lab Results  Component Value Date   WBC 4.1 11/03/2022   NEUTROABS 2.4 11/03/2022   HGB 13.9 11/03/2022   HCT 42.8 11/03/2022   MCV 94.5 11/03/2022   PLT 246 11/03/2022      Chemistry      Component Value Date/Time   NA 136 11/03/2022 0821   NA 139 05/01/2014 1322   K 3.7 11/03/2022 0821   K 4.4 05/01/2014 1322   CL 107 11/03/2022 0821   CL 107 05/01/2014 1322   CO2 22 11/03/2022 0821   CO2 27 05/01/2014 1322   BUN 14 11/03/2022 0821   BUN 16 05/01/2014 1322   CREATININE 1.29 (H) 11/03/2022 0821   CREATININE 1.48 (H) 05/29/2014 0949      Component Value Date/Time   CALCIUM 8.9 11/03/2022 0821   CALCIUM 9.1 05/01/2014 1322   ALKPHOS 61 11/03/2022 0821   ALKPHOS 47 05/01/2014 1322   AST 23 11/03/2022 0821   ALT 25 11/03/2022 0821   ALT 13 (L) 05/01/2014 1322   BILITOT 0.9 11/03/2022 0821       RADIOGRAPHIC STUDIES: I have personally reviewed the radiological images as listed and agreed with the findings in the report. No results found.   ASSESSMENT & PLAN:  Multiple myeloma in remission (HCC) #Multiple myeloma-most recently on maintenance Revlimid 15 mg 2  weeks on 2 week off.  SEP   2024- M-protein- 00.8 [April 2023- NEG]; kappa=105 lambda light chain ratio=2.5 [July 2020- 2.0].   # I reviewed with the patient that given the slight increase in the kappa lambda light  chain ratio/multiple myeloma will concerning for recurrent myeloma.  - however renal function/Hb;calcium stable.  If continue to worsen or noticed to have worsening anemia/renal function-would consider bone marrow biopsy x-ray/PET scans.  Understands that he might need alternative therapy if progression of disease is confirmed.  # For now continue Revlimid 15 mg 2 weeks; 2 weeks OFF; Today ANC 2.4 - hemoglobin-12.6 platelets-144- iron studies; ferritin; stable.   # CKD-III-  GFR  53 [baseline creatinine 1.3-1.4; peak 1.8]. stable. April 2024- Vit D 38- recommend Vit D 50,K/weekly.   #Bone modifying agent/multiple myeloma-- on zometa 3 mg every other month; ca- 9.1 with zometa today.  Contienu vit D as above.   # Zometa q 6 M-last 11/10/2022  Disposition: # Zometa today  # 1 month- labs- cbc/cmp;   # Follow up in 2 months -  MD; 1 week prior- labs [CBC, CMP, kappa lambda light chains, multiple myeloma panel-] - Dr.B   Orders Placed This Encounter  Procedures   CBC with Differential (Cancer Center Only)    Standing Status:   Standing    Number of Occurrences:   2    Standing Expiration Date:   11/10/2023   CMP (Cancer Center only)    Standing Status:   Standing    Number of Occurrences:   2    Standing Expiration Date:   11/10/2023   Kappa/lambda light chains    Standing Status:   Future    Standing Expiration Date:   11/10/2023   Multiple Myeloma Panel (SPEP&IFE w/QIG)    Standing Status:   Future    Standing Expiration Date:   11/10/2023    All questions were answered. The patient knows to call the clinic with any problems, questions or concerns.      Earna Coder, MD 11/10/2022 12:11 PM

## 2022-11-10 NOTE — Assessment & Plan Note (Addendum)
#  Multiple myeloma-most recently on maintenance Revlimid 15 mg 2  weeks on 2 week off.  SEP   2024- M-protein- 00.8 [April 2023- NEG]; kappa=105 lambda light chain ratio=2.5 [July 2020- 2.0].   # I reviewed with the patient that given the slight increase in the kappa lambda light chain ratio/multiple myeloma will concerning for recurrent myeloma.  - however renal function/Hb;calcium stable.  If continue to worsen or noticed to have worsening anemia/renal function-would consider bone marrow biopsy x-ray/PET scans.  Understands that he might need alternative therapy if progression of disease is confirmed.  # For now continue Revlimid 15 mg 2 weeks; 2 weeks OFF; Today ANC 2.4 - hemoglobin-12.6 platelets-144- iron studies; ferritin; stable.   # CKD-III- GFR  53 [baseline creatinine 1.3-1.4; peak 1.8]. stable. April 2024- Vit D 38- recommend Vit D 50,K/weekly.   #Bone modifying agent/multiple myeloma-- on zometa 3 mg every other month; ca- 9.1 with zometa today.  Contienu vit D as above.   # Zometa q 6 M-last 11/10/2022  Disposition: # Zometa today  # 1 month- labs- cbc/cmp;   # Follow up in 2 months -  MD; 1 week prior- labs [CBC, CMP, kappa lambda light chains, multiple myeloma panel-] - Dr.B

## 2022-11-13 ENCOUNTER — Other Ambulatory Visit: Payer: Self-pay | Admitting: *Deleted

## 2022-11-13 DIAGNOSIS — C9001 Multiple myeloma in remission: Secondary | ICD-10-CM

## 2022-11-13 MED ORDER — LENALIDOMIDE 15 MG PO CAPS
15.0000 mg | ORAL_CAPSULE | Freq: Every day | ORAL | 0 refills | Status: DC
Start: 2022-11-13 — End: 2022-12-11

## 2022-12-11 ENCOUNTER — Other Ambulatory Visit: Payer: Self-pay

## 2022-12-11 ENCOUNTER — Inpatient Hospital Stay: Payer: Medicare HMO | Attending: Internal Medicine

## 2022-12-11 DIAGNOSIS — C9001 Multiple myeloma in remission: Secondary | ICD-10-CM | POA: Diagnosis present

## 2022-12-11 DIAGNOSIS — N183 Chronic kidney disease, stage 3 unspecified: Secondary | ICD-10-CM | POA: Insufficient documentation

## 2022-12-11 LAB — CBC WITH DIFFERENTIAL (CANCER CENTER ONLY)
Abs Immature Granulocytes: 0.01 10*3/uL (ref 0.00–0.07)
Basophils Absolute: 0 10*3/uL (ref 0.0–0.1)
Basophils Relative: 1 %
Eosinophils Absolute: 0.2 10*3/uL (ref 0.0–0.5)
Eosinophils Relative: 5 %
HCT: 41.9 % (ref 39.0–52.0)
Hemoglobin: 13.6 g/dL (ref 13.0–17.0)
Immature Granulocytes: 0 %
Lymphocytes Relative: 21 %
Lymphs Abs: 0.7 10*3/uL (ref 0.7–4.0)
MCH: 30.4 pg (ref 26.0–34.0)
MCHC: 32.5 g/dL (ref 30.0–36.0)
MCV: 93.5 fL (ref 80.0–100.0)
Monocytes Absolute: 0.9 10*3/uL (ref 0.1–1.0)
Monocytes Relative: 27 %
Neutro Abs: 1.6 10*3/uL — ABNORMAL LOW (ref 1.7–7.7)
Neutrophils Relative %: 46 %
Platelet Count: 144 10*3/uL — ABNORMAL LOW (ref 150–400)
RBC: 4.48 MIL/uL (ref 4.22–5.81)
RDW: 14.6 % (ref 11.5–15.5)
WBC Count: 3.4 10*3/uL — ABNORMAL LOW (ref 4.0–10.5)
nRBC: 0 % (ref 0.0–0.2)

## 2022-12-11 LAB — CMP (CANCER CENTER ONLY)
ALT: 20 U/L (ref 0–44)
AST: 22 U/L (ref 15–41)
Albumin: 3.7 g/dL (ref 3.5–5.0)
Alkaline Phosphatase: 63 U/L (ref 38–126)
Anion gap: 4 — ABNORMAL LOW (ref 5–15)
BUN: 16 mg/dL (ref 8–23)
CO2: 25 mmol/L (ref 22–32)
Calcium: 9 mg/dL (ref 8.9–10.3)
Chloride: 109 mmol/L (ref 98–111)
Creatinine: 1.3 mg/dL — ABNORMAL HIGH (ref 0.61–1.24)
GFR, Estimated: 56 mL/min — ABNORMAL LOW (ref >60)
Glucose, Bld: 137 mg/dL — ABNORMAL HIGH (ref 70–99)
Potassium: 4.3 mmol/L (ref 3.5–5.1)
Sodium: 138 mmol/L (ref 135–145)
Total Bilirubin: 0.7 mg/dL (ref <1.2)
Total Protein: 7.8 g/dL (ref 6.5–8.1)

## 2022-12-11 MED ORDER — LENALIDOMIDE 15 MG PO CAPS: 15.0000 mg | ORAL_CAPSULE | Freq: Every day | ORAL | 0 refills | Status: DC

## 2023-01-03 ENCOUNTER — Inpatient Hospital Stay: Payer: Medicare HMO

## 2023-01-03 DIAGNOSIS — C9001 Multiple myeloma in remission: Secondary | ICD-10-CM

## 2023-01-03 LAB — CMP (CANCER CENTER ONLY)
ALT: 22 U/L (ref 0–44)
AST: 25 U/L (ref 15–41)
Albumin: 3.9 g/dL (ref 3.5–5.0)
Alkaline Phosphatase: 63 U/L (ref 38–126)
Anion gap: 8 (ref 5–15)
BUN: 20 mg/dL (ref 8–23)
CO2: 25 mmol/L (ref 22–32)
Calcium: 9.4 mg/dL (ref 8.9–10.3)
Chloride: 105 mmol/L (ref 98–111)
Creatinine: 1.64 mg/dL — ABNORMAL HIGH (ref 0.61–1.24)
GFR, Estimated: 43 mL/min — ABNORMAL LOW (ref >60)
Glucose, Bld: 167 mg/dL — ABNORMAL HIGH (ref 70–99)
Potassium: 3.9 mmol/L (ref 3.5–5.1)
Sodium: 138 mmol/L (ref 135–145)
Total Bilirubin: 1 mg/dL (ref <1.2)
Total Protein: 8 g/dL (ref 6.5–8.1)

## 2023-01-03 LAB — CBC WITH DIFFERENTIAL (CANCER CENTER ONLY)
Abs Immature Granulocytes: 0.03 10*3/uL (ref 0.00–0.07)
Basophils Absolute: 0 10*3/uL (ref 0.0–0.1)
Basophils Relative: 0 %
Eosinophils Absolute: 0.2 10*3/uL (ref 0.0–0.5)
Eosinophils Relative: 5 %
HCT: 41.8 % (ref 39.0–52.0)
Hemoglobin: 13.8 g/dL (ref 13.0–17.0)
Immature Granulocytes: 1 %
Lymphocytes Relative: 17 %
Lymphs Abs: 0.7 10*3/uL (ref 0.7–4.0)
MCH: 31.3 pg (ref 26.0–34.0)
MCHC: 33 g/dL (ref 30.0–36.0)
MCV: 94.8 fL (ref 80.0–100.0)
Monocytes Absolute: 0.4 10*3/uL (ref 0.1–1.0)
Monocytes Relative: 9 %
Neutro Abs: 2.8 10*3/uL (ref 1.7–7.7)
Neutrophils Relative %: 68 %
Platelet Count: 174 10*3/uL (ref 150–400)
RBC: 4.41 MIL/uL (ref 4.22–5.81)
RDW: 14.6 % (ref 11.5–15.5)
WBC Count: 4.2 10*3/uL (ref 4.0–10.5)
nRBC: 0 % (ref 0.0–0.2)

## 2023-01-04 LAB — KAPPA/LAMBDA LIGHT CHAINS
Kappa free light chain: 113.8 mg/L — ABNORMAL HIGH (ref 3.3–19.4)
Kappa, lambda light chain ratio: 2.62 — ABNORMAL HIGH (ref 0.26–1.65)
Lambda free light chains: 43.4 mg/L — ABNORMAL HIGH (ref 5.7–26.3)

## 2023-01-11 LAB — MULTIPLE MYELOMA PANEL, SERUM
Albumin SerPl Elph-Mcnc: 3.7 g/dL (ref 2.9–4.4)
Albumin/Glob SerPl: 1 (ref 0.7–1.7)
Alpha 1: 0.2 g/dL (ref 0.0–0.4)
Alpha2 Glob SerPl Elph-Mcnc: 0.6 g/dL (ref 0.4–1.0)
B-Globulin SerPl Elph-Mcnc: 1.3 g/dL (ref 0.7–1.3)
Gamma Glob SerPl Elph-Mcnc: 1.8 g/dL (ref 0.4–1.8)
Globulin, Total: 3.9 g/dL (ref 2.2–3.9)
IgA: 607 mg/dL — ABNORMAL HIGH (ref 61–437)
IgG (Immunoglobin G), Serum: 1965 mg/dL — ABNORMAL HIGH (ref 603–1613)
IgM (Immunoglobulin M), Srm: 42 mg/dL (ref 15–143)
M Protein SerPl Elph-Mcnc: 0.8 g/dL — ABNORMAL HIGH
Total Protein ELP: 7.6 g/dL (ref 6.0–8.5)

## 2023-01-12 ENCOUNTER — Inpatient Hospital Stay: Payer: Medicare HMO | Attending: Internal Medicine | Admitting: Internal Medicine

## 2023-01-12 ENCOUNTER — Other Ambulatory Visit: Payer: Medicare HMO

## 2023-01-12 ENCOUNTER — Other Ambulatory Visit: Payer: Self-pay | Admitting: *Deleted

## 2023-01-12 ENCOUNTER — Encounter: Payer: Self-pay | Admitting: Internal Medicine

## 2023-01-12 ENCOUNTER — Ambulatory Visit: Payer: Medicare HMO

## 2023-01-12 VITALS — BP 160/78 | HR 58 | Temp 97.3°F | Resp 16 | Wt 227.2 lb

## 2023-01-12 DIAGNOSIS — N183 Chronic kidney disease, stage 3 unspecified: Secondary | ICD-10-CM | POA: Diagnosis not present

## 2023-01-12 DIAGNOSIS — C9001 Multiple myeloma in remission: Secondary | ICD-10-CM

## 2023-01-12 DIAGNOSIS — Z85850 Personal history of malignant neoplasm of thyroid: Secondary | ICD-10-CM | POA: Insufficient documentation

## 2023-01-12 MED ORDER — LENALIDOMIDE 15 MG PO CAPS: 15.0000 mg | ORAL_CAPSULE | Freq: Every day | ORAL | 0 refills | Status: DC

## 2023-01-12 NOTE — Assessment & Plan Note (Addendum)
#  Multiple myeloma-most recently on maintenance Revlimid 15 mg 2  weeks on 2 week off.  NOV  2024- M-protein- 00.8 [April 2023- NEG]; kappa=113 lambda light chain ratio=2.5 [July 2020- 2.0].   # I reviewed with the patient that given the slight increase in the kappa lambda light chain ratio/multiple myeloma will concerning for recurrent myeloma.  - however renal function/Hb;calcium stable.  If continue to worsen or noticed to have worsening anemia/renal function-would consider bone marrow biopsy x-ray/PET scans.  Understands that he might need alternative therapy if progression of disease is confirmed- monitor  for now.  # For now continue Revlimid 15 mg 2 weeks; 2 weeks OFF; Today ANC 2.4 - hemoglobin-12.6 platelets-144- iron studies; ferritin; stable.   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 stable. April 2024- Vit D 38- recommend Vit D 50,K/weekly.   #Bone modifying agent/multiple myeloma-- on zometa 3 mg every other month; ca- 9.1 with zometa today.  Contienu vit D as above.   # Zometa q 6 M-last 11/10/2022  Disposition: # 1 month- labs- cbc/cmp;   # Follow up in 2 months -  MD; 1 week prior- labs [CBC, CMP, kappa lambda light chains, multiple myeloma panel-] - Dr.B

## 2023-01-12 NOTE — Progress Notes (Signed)
Feels well. Offers no complaints today.

## 2023-01-12 NOTE — Progress Notes (Signed)
[facility] OFFICE PROGRESS NOTE  Patient Care Team: Gracelyn Nurse, MD as PCP - General (Internal Medicine) Cherlyn Roberts, MD as Consulting Physician (Dermatology) Earna Coder, MD as Consulting Physician (Hematology and Oncology)   Cancer Staging  No matching staging information was found for the patient.    Oncology History Overview Note   # April 2014- MULTIPLE MYELOMA [IgG- 2.2gm/dl; 24-40% plasma cell; Cyto-N; FISH- gain of chr.9, 15 & ccnd1/11q13] s/p RVD x6 [sep 2014; BMBx- ~5% plasma cells]; Maint Rev-DEX-Zometa; March 2015-Stopped Dex Cont- Rev-Zometa; July 2016- Rev 25mg ; NOV 4th  2016-HOLD; BMT eval at [facility] [Dr.Woods]; declined BMT.   # Re-START FEB 2017 Rev 15mg  3 W- on & 1 w OFF; HELD Rev June- Oct 2020- sec to worsening renal function [Dr.Singh]; SEP 2021- Rev 15 mg 2 w-On & 2 w-OFF.   # Zometa  # CKD [creat ~1.4; sec MM; Dr.Singh]; DM  -------------------------------------------------------------------------   DIAGNOSIS: [place].Clarity ] MULTIPLE MYELOMA  GOALS: control  CURRENT/MOST RECENT THERAPY- REVLIMID Maintenance [Feb 2017]    Multiple myeloma in remission (HCC)  10/01/2015 Initial Diagnosis   Multiple myeloma in remission (HCC)    INTERVAL HISTORY: pt is alone. Ambulating independently.   Alan Alexander 77 y.o.  male pleasant patient above history of multiple myeloma  on Revlimid maintenance [2 weeks on 2-week off] is here for follow-up.  Pt in for follow up, denies any difficulties or concerns today.   Patient denies any skin rash.  Denies any diarrhea.  No swelling in the legs.  No nausea vomiting.  Admits to compliance with Revlimid.  Review of Systems  Constitutional:  Negative for chills, diaphoresis, fever, malaise/fatigue and weight loss.  HENT:  Negative for nosebleeds and sore throat.   Eyes:  Negative for double vision.  Respiratory:  Negative for cough, hemoptysis, sputum production, shortness of breath and wheezing.    Cardiovascular:  Positive for leg swelling. Negative for chest pain, palpitations and orthopnea.  Gastrointestinal:  Negative for abdominal pain, blood in stool, constipation, diarrhea, heartburn, melena, nausea and vomiting.  Genitourinary:  Negative for dysuria, frequency and urgency.  Musculoskeletal:  Negative for back pain and joint pain.  Skin: Negative.  Negative for itching and rash.  Neurological:  Negative for dizziness, tingling, focal weakness, weakness and headaches.  Endo/Heme/Allergies:  Does not bruise/bleed easily.  Psychiatric/Behavioral:  Negative for depression. The patient is not nervous/anxious and does not have insomnia.       PAST MEDICAL HISTORY :  Past Medical History:  Diagnosis Date   Diabetes mellitus without complication (HCC)    GERD (gastroesophageal reflux disease)    Hyperlipidemia    Hypertension    Multiple myeloma (HCC)    Obesity    Thyroid cancer (HCC)    Thyroid disease     PAST SURGICAL HISTORY :   Past Surgical History:  Procedure Laterality Date   BONE MARROW BIOPSY  2014   CATARACT EXTRACTION BILATERAL W/ ANTERIOR VITRECTOMY      FAMILY HISTORY :   Family History  Problem Relation Age of Onset   Cancer Father 59   Diabetes Mother     SOCIAL HISTORY:   Social History   Tobacco Use   Smoking status: Never   Smokeless tobacco: Never  Substance Use Topics   Alcohol use: No   Drug use: No    ALLERGIES:  has No Known Allergies.  MEDICATIONS:  Current Outpatient Medications  Medication Sig Dispense Refill   amLODipine (NORVASC) 10  MG tablet Take 10 mg by mouth daily.     aspirin EC 81 MG tablet Take 81 mg by mouth daily.     atorvastatin (LIPITOR) 20 MG tablet Take 20 mg by mouth daily.     cloNIDine (CATAPRES) 0.1 MG tablet Take 0.1 mg by mouth 2 (two) times daily.     Cyanocobalamin 1000 MCG TBCR Take by mouth.     dapagliflozin propanediol (FARXIGA) 10 MG TABS tablet TAKE 10 MG BY MOUTH 1 (ONE) TIME EACH DAY IN THE  MORNING     ergocalciferol (VITAMIN D2) 1.25 MG (50000 UT) capsule Take 1 capsule (50,000 Units total) by mouth once a week. 12 capsule 1   famotidine (PEPCID) 20 MG tablet Take 1 tablet (20 mg total) by mouth 2 (two) times daily. 14 tablet 0   glipiZIDE (GLUCOTROL) 5 MG tablet Take 5 mg by mouth every morning.     lenalidomide (REVLIMID) 15 MG capsule Take 1 capsule (15 mg total) by mouth daily. Take for 14 days, then hold for 14 days. Repeat every 28 days. 14 capsule 0   levothyroxine (SYNTHROID) 137 MCG tablet Take 1 tablet by mouth daily.      lisinopril (ZESTRIL) 20 MG tablet Take 20 mg by mouth daily.     ONETOUCH DELICA LANCETS 33G MISC Inject 1 Lancet as directed once daily.     pioglitazone (ACTOS) 30 MG tablet Take 30 mg by mouth daily.     potassium chloride SA (K-DUR,KLOR-CON) 20 MEQ tablet Take 20 mEq by mouth 2 (two) times daily.   0   quinapril (ACCUPRIL) 40 MG tablet Take 40 mg by mouth daily.     torsemide (DEMADEX) 20 MG tablet Take 20 mg by mouth daily as needed.     No current facility-administered medications for this visit.   Facility-Administered Medications Ordered in Other Visits  Medication Dose Route Frequency Provider Last Rate Last Admin   0.9 %  sodium chloride infusion   Intravenous Continuous Earna Coder, MD   Stopped at 12/11/14 1528    PHYSICAL EXAMINATION: ECOG PERFORMANCE STATUS: 0 - Asymptomatic  BP (!) 160/78 (BP Location: Left Arm, Patient Position: Sitting)   Pulse (!) 58   Temp (!) 97.3 F (36.3 C) (Tympanic)   Resp 16   Wt 227 lb 3.2 oz (103.1 kg)   SpO2 100%   BMI 33.55 kg/m   Filed Weights   01/12/23 1432  Weight: 227 lb 3.2 oz (103.1 kg)        Physical Exam Vitals and nursing note reviewed.  HENT:     Head: Normocephalic and atraumatic.     Mouth/Throat:     Pharynx: Oropharynx is clear. No oropharyngeal exudate.  Eyes:     Extraocular Movements: Extraocular movements intact.     Pupils: Pupils are equal, round,  and reactive to light.  Cardiovascular:     Rate and Rhythm: Normal rate and regular rhythm.  Pulmonary:     Effort: No respiratory distress.     Breath sounds: No wheezing.     Comments: Decreased breath sounds bilaterally.  Abdominal:     General: Bowel sounds are normal. There is no distension.     Palpations: Abdomen is soft. There is no mass.     Tenderness: There is no abdominal tenderness. There is no guarding or rebound.  Musculoskeletal:        General: No tenderness. Normal range of motion.     Cervical back: Normal range of  motion and neck supple.  Skin:    General: Skin is warm.  Neurological:     General: No focal deficit present.     Mental Status: He is alert and oriented to person, place, and time.  Psychiatric:        Mood and Affect: Affect normal.        Behavior: Behavior normal.        Judgment: Judgment normal.       LABORATORY DATA:  I have reviewed the data as listed    Component Value Date/Time   NA 138 01/03/2023 1410   NA 139 05/01/2014 1322   K 3.9 01/03/2023 1410   K 4.4 05/01/2014 1322   CL 105 01/03/2023 1410   CL 107 05/01/2014 1322   CO2 25 01/03/2023 1410   CO2 27 05/01/2014 1322   GLUCOSE 167 (H) 01/03/2023 1410   GLUCOSE 102 (H) 05/01/2014 1322   BUN 20 01/03/2023 1410   BUN 16 05/01/2014 1322   CREATININE 1.64 (H) 01/03/2023 1410   CREATININE 1.48 (H) 05/29/2014 0949   CALCIUM 9.4 01/03/2023 1410   CALCIUM 9.1 05/01/2014 1322   PROT 8.0 01/03/2023 1410   PROT 7.5 05/01/2014 1322   ALBUMIN 3.9 01/03/2023 1410   ALBUMIN 3.6 05/01/2014 1322   AST 25 01/03/2023 1410   ALT 22 01/03/2023 1410   ALT 13 (L) 05/01/2014 1322   ALKPHOS 63 01/03/2023 1410   ALKPHOS 47 05/01/2014 1322   BILITOT 1.0 01/03/2023 1410   GFRNONAA 43 (L) 01/03/2023 1410   GFRNONAA 47 (L) 05/29/2014 0949   GFRAA 47 (L) 10/21/2019 1106   GFRAA 55 (L) 05/29/2014 0949    No results found for: "SPEP", "UPEP"  Lab Results  Component Value Date   WBC 4.2  01/03/2023   NEUTROABS 2.8 01/03/2023   HGB 13.8 01/03/2023   HCT 41.8 01/03/2023   MCV 94.8 01/03/2023   PLT 174 01/03/2023      Chemistry      Component Value Date/Time   NA 138 01/03/2023 1410   NA 139 05/01/2014 1322   K 3.9 01/03/2023 1410   K 4.4 05/01/2014 1322   CL 105 01/03/2023 1410   CL 107 05/01/2014 1322   CO2 25 01/03/2023 1410   CO2 27 05/01/2014 1322   BUN 20 01/03/2023 1410   BUN 16 05/01/2014 1322   CREATININE 1.64 (H) 01/03/2023 1410   CREATININE 1.48 (H) 05/29/2014 0949      Component Value Date/Time   CALCIUM 9.4 01/03/2023 1410   CALCIUM 9.1 05/01/2014 1322   ALKPHOS 63 01/03/2023 1410   ALKPHOS 47 05/01/2014 1322   AST 25 01/03/2023 1410   ALT 22 01/03/2023 1410   ALT 13 (L) 05/01/2014 1322   BILITOT 1.0 01/03/2023 1410       RADIOGRAPHIC STUDIES: I have personally reviewed the radiological images as listed and agreed with the findings in the report. No results found.   ASSESSMENT & PLAN:  Multiple myeloma in remission (HCC) #Multiple myeloma-most recently on maintenance Revlimid 15 mg 2  weeks on 2 week off.  NOV  2024- M-protein- 00.8 [April 2023- NEG]; kappa=113 lambda light chain ratio=2.5 [July 2020- 2.0].   # I reviewed with the patient that given the slight increase in the kappa lambda light chain ratio/multiple myeloma will concerning for recurrent myeloma.  - however renal function/Hb;calcium stable.  If continue to worsen or noticed to have worsening anemia/renal function-would consider bone marrow biopsy x-ray/PET scans.  Understands  that he might need alternative therapy if progression of disease is confirmed- monitor  for now.  # For now continue Revlimid 15 mg 2 weeks; 2 weeks OFF; Today ANC 2.4 - hemoglobin-12.6 platelets-144- iron studies; ferritin; stable.   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 stable. April 2024- Vit D 38- recommend Vit D 50,K/weekly.   #Bone modifying agent/multiple myeloma-- on zometa  3 mg every other month; ca- 9.1 with zometa today.  Contienu vit D as above.   # Zometa q 6 M-last 11/10/2022  Disposition: # 1 month- labs- cbc/cmp;   # Follow up in 2 months -  MD; 1 week prior- labs [CBC, CMP, kappa lambda light chains, multiple myeloma panel-] - Dr.B    Orders Placed This Encounter  Procedures   CBC with Differential (Cancer Center Only)    Standing Status:   Future    Standing Expiration Date:   01/12/2024   CMP (Cancer Center only)    Standing Status:   Future    Standing Expiration Date:   01/12/2024   Kappa/lambda light chains    Standing Status:   Future    Standing Expiration Date:   01/12/2024   Multiple Myeloma Panel (SPEP&IFE w/QIG)    Standing Status:   Future    Standing Expiration Date:   01/12/2024   CBC with Differential (Cancer Center Only)    Standing Status:   Future    Standing Expiration Date:   01/12/2024   CMP (Cancer Center only)    Standing Status:   Future    Standing Expiration Date:   01/12/2024    All questions were answered. The patient knows to call the clinic with any problems, questions or concerns.      Earna Coder, MD 01/12/2023 2:50 PM

## 2023-02-12 ENCOUNTER — Emergency Department: Payer: Medicare HMO

## 2023-02-12 ENCOUNTER — Emergency Department
Admission: EM | Admit: 2023-02-12 | Discharge: 2023-02-12 | Disposition: A | Discharge status: Home / Self Care | Payer: Medicare HMO | Attending: Emergency Medicine | Admitting: Emergency Medicine

## 2023-02-12 ENCOUNTER — Encounter: Payer: Self-pay | Admitting: Emergency Medicine

## 2023-02-12 ENCOUNTER — Inpatient Hospital Stay: Payer: Medicare HMO

## 2023-02-12 ENCOUNTER — Other Ambulatory Visit: Payer: Self-pay

## 2023-02-12 ENCOUNTER — Other Ambulatory Visit: Payer: Self-pay | Admitting: *Deleted

## 2023-02-12 DIAGNOSIS — X58XXXA Exposure to other specified factors, initial encounter: Secondary | ICD-10-CM | POA: Insufficient documentation

## 2023-02-12 DIAGNOSIS — S46912A Strain of unspecified muscle, fascia and tendon at shoulder and upper arm level, left arm, initial encounter: Secondary | ICD-10-CM | POA: Insufficient documentation

## 2023-02-12 DIAGNOSIS — S4992XA Unspecified injury of left shoulder and upper arm, initial encounter: Secondary | ICD-10-CM | POA: Diagnosis present

## 2023-02-12 DIAGNOSIS — M542 Cervicalgia: Secondary | ICD-10-CM | POA: Diagnosis not present

## 2023-02-12 DIAGNOSIS — E119 Type 2 diabetes mellitus without complications: Secondary | ICD-10-CM | POA: Diagnosis not present

## 2023-02-12 DIAGNOSIS — C9001 Multiple myeloma in remission: Secondary | ICD-10-CM

## 2023-02-12 DIAGNOSIS — I10 Essential (primary) hypertension: Secondary | ICD-10-CM | POA: Insufficient documentation

## 2023-02-12 LAB — CBC
HCT: 45.3 % (ref 39.0–52.0)
Hemoglobin: 14.8 g/dL (ref 13.0–17.0)
MCH: 31 pg (ref 26.0–34.0)
MCHC: 32.7 g/dL (ref 30.0–36.0)
MCV: 94.8 fL (ref 80.0–100.0)
Platelets: 162 10*3/uL (ref 150–400)
RBC: 4.78 MIL/uL (ref 4.22–5.81)
RDW: 14.8 % (ref 11.5–15.5)
WBC: 5.2 10*3/uL (ref 4.0–10.5)
nRBC: 0 % (ref 0.0–0.2)

## 2023-02-12 LAB — BASIC METABOLIC PANEL
Anion gap: 9 (ref 5–15)
BUN: 18 mg/dL (ref 8–23)
CO2: 22 mmol/L (ref 22–32)
Calcium: 9.2 mg/dL (ref 8.9–10.3)
Chloride: 108 mmol/L (ref 98–111)
Creatinine, Ser: 1.41 mg/dL — ABNORMAL HIGH (ref 0.61–1.24)
GFR, Estimated: 51 mL/min — ABNORMAL LOW (ref >60)
Glucose, Bld: 174 mg/dL — ABNORMAL HIGH (ref 70–99)
Potassium: 3.9 mmol/L (ref 3.5–5.1)
Sodium: 139 mmol/L (ref 135–145)

## 2023-02-12 LAB — TROPONIN I (HIGH SENSITIVITY): Troponin I (High Sensitivity): 12 ng/L (ref <18)

## 2023-02-12 MED ORDER — LENALIDOMIDE 15 MG PO CAPS: 15.0000 mg | ORAL_CAPSULE | Freq: Every day | ORAL | 0 refills | Status: DC

## 2023-02-12 MED ORDER — NAPROXEN 500 MG PO TABS
500.0000 mg | ORAL_TABLET | Freq: Once | ORAL | Status: AC
Start: 2023-02-12 — End: 2023-02-12
  Administered 2023-02-12: 500 mg via ORAL
  Filled 2023-02-12: qty 1

## 2023-02-12 MED ORDER — CYCLOBENZAPRINE HCL 5 MG PO TABS: 5.0000 mg | ORAL_TABLET | Freq: Two times a day (BID) | ORAL | 0 refills | Status: DC | PRN

## 2023-02-12 MED ORDER — NAPROXEN 500 MG PO TABS: 500.0000 mg | ORAL_TABLET | Freq: Two times a day (BID) | ORAL | 2 refills | Status: DC

## 2023-02-12 NOTE — ED Triage Notes (Signed)
 Pt to ED via POV c/o left shoulder pain that started on Friday. Pt states that the pain is intermittent. Pt reports that the pain comes depending on what position his arm is in. Pt denies any other symptoms besides pain. Pt denies injury to the area.

## 2023-02-12 NOTE — ED Provider Notes (Signed)
 [facility] Provider Note    Event Date/Time   First MD Initiated Contact with Patient 02/12/23 [phone]     (approximate)   History   Shoulder Pain   HPI  Chavis Tessler is a 78 y.o. male with a history of hypertension, multiple myeloma, diabetes who presents with complaints of left shoulder/neck pain which he reports started several days ago.  He reports it is painful to move his left arm, particularly to abduct his left arm.  Denies trauma no, no falls.  No rash.  No fevers chills.  No chest pain.  No shortness of breath     Physical Exam   Triage Vital Signs: ED Triage Vitals  Encounter Vitals Group     BP 02/12/23 0848 (!) 173/98     Systolic BP Percentile --      Diastolic BP Percentile --      Pulse Rate 02/12/23 0848 90     Resp 02/12/23 0848 16     Temp 02/12/23 0849 97.8 F (36.6 C)     Temp Source 02/12/23 0849 Oral     SpO2 02/12/23 0848 99 %     Weight 02/12/23 0849 108.9 kg (240 lb)     Height 02/12/23 0849 1.778 m (5' 10)     Head Circumference --      Peak Flow --      Pain Score 02/12/23 0848 9     Pain Loc --      Pain Education --      Exclude from Growth Chart --     Most recent vital signs: Vitals:   02/12/23 0848 02/12/23 0849  BP: (!) 173/98   Pulse: 90   Resp: 16   Temp:  97.8 F (36.6 C)  SpO2: 99%      General: Awake, no distress.  CV:  Good peripheral perfusion.  Resp:  Normal effort.  Abd:  No distention.  Other:  Patient has pain when abducting his left arm, he is able to do so, he also has tenderness along the left trapezius which significantly worsens the patient's pain.  Normal pulses distally, normal grip, no neck pain reported   ED Results / Procedures / Treatments   Labs (all labs ordered are listed, but only abnormal results are displayed) Labs Reviewed  BASIC METABOLIC PANEL - Abnormal; Notable for the following components:      Result Value   Glucose, Bld 174 (*)    Creatinine, Ser 1.41 (*)     GFR, Estimated 51 (*)    All other components within normal limits  CBC  TROPONIN I (HIGH SENSITIVITY)  TROPONIN I (HIGH SENSITIVITY)     EKG  ED ECG REPORT I, Lamar Price, the attending physician, personally viewed and interpreted this ECG.  Date: 02/12/2023  Rhythm: normal sinus rhythm QRS Axis: normal Intervals: Abnormal QT ST/T Wave abnormalities: normal Narrative Interpretation: no evidence of acute ischemia    RADIOLOGY Chest x-ray viewed interpret by me, no acute abnormality    PROCEDURES:  Critical Care performed:   Procedures   MEDICATIONS ORDERED IN ED: Medications  naproxen  (NAPROSYN ) tablet 500 mg (500 mg Oral Given 02/12/23 0959)     IMPRESSION / MDM / ASSESSMENT AND PLAN / ED COURSE  I reviewed the triage vital signs and the nursing notes. Patient's presentation is most consistent with acute complicated illness / injury requiring diagnostic workup.  Patient presents with left shoulder pain as detailed above, significant tenderness to palpation  along the lateral trapezius on the left which worsens his pain making this likely musculoskeletal.  Lab work is reviewed and reassuring, EKG, chest x-ray without evidence of pneumonia or ischemia.  Normal pulses distally.  No rash to suggest shingles  Will start the patient on low-dose Flexeril , NSAIDs, close follow-up with orthopedics for further diagnostics        FINAL CLINICAL IMPRESSION(S) / ED DIAGNOSES   Final diagnoses:  Strain of left shoulder, initial encounter     Rx / DC Orders   ED Discharge Orders          Ordered    naproxen  (NAPROSYN ) 500 MG tablet  2 times daily with meals        02/12/23 0955    cyclobenzaprine  (FLEXERIL ) 5 MG tablet  2 times daily PRN        02/12/23 0955             Note:  This document was prepared using Dragon voice recognition software and may include unintentional dictation errors.   Arlander Charleston, MD 02/12/23 [phone]

## 2023-02-19 ENCOUNTER — Inpatient Hospital Stay: Payer: Medicare HMO | Attending: Internal Medicine

## 2023-02-19 ENCOUNTER — Other Ambulatory Visit: Payer: Self-pay | Admitting: *Deleted

## 2023-02-19 DIAGNOSIS — C9001 Multiple myeloma in remission: Secondary | ICD-10-CM

## 2023-02-19 LAB — CMP (CANCER CENTER ONLY)
ALT: 17 U/L (ref 0–44)
AST: 20 U/L (ref 15–41)
Albumin: 3.7 g/dL (ref 3.5–5.0)
Alkaline Phosphatase: 74 U/L (ref 38–126)
Anion gap: 9 (ref 5–15)
BUN: 22 mg/dL (ref 8–23)
CO2: 24 mmol/L (ref 22–32)
Calcium: 9.2 mg/dL (ref 8.9–10.3)
Chloride: 105 mmol/L (ref 98–111)
Creatinine: 1.5 mg/dL — ABNORMAL HIGH (ref 0.61–1.24)
GFR, Estimated: 47 mL/min — ABNORMAL LOW (ref >60)
Glucose, Bld: 167 mg/dL — ABNORMAL HIGH (ref 70–99)
Potassium: 4.4 mmol/L (ref 3.5–5.1)
Sodium: 138 mmol/L (ref 135–145)
Total Bilirubin: 0.7 mg/dL (ref 0.0–1.2)
Total Protein: 8.3 g/dL — ABNORMAL HIGH (ref 6.5–8.1)

## 2023-02-19 LAB — CBC WITH DIFFERENTIAL (CANCER CENTER ONLY)
Abs Immature Granulocytes: 0.01 10*3/uL (ref 0.00–0.07)
Basophils Absolute: 0 10*3/uL (ref 0.0–0.1)
Basophils Relative: 1 %
Eosinophils Absolute: 0.2 10*3/uL (ref 0.0–0.5)
Eosinophils Relative: 6 %
HCT: 45.1 % (ref 39.0–52.0)
Hemoglobin: 14.9 g/dL (ref 13.0–17.0)
Immature Granulocytes: 0 %
Lymphocytes Relative: 27 %
Lymphs Abs: 1 10*3/uL (ref 0.7–4.0)
MCH: 30.6 pg (ref 26.0–34.0)
MCHC: 33 g/dL (ref 30.0–36.0)
MCV: 92.6 fL (ref 80.0–100.0)
Monocytes Absolute: 0.6 10*3/uL (ref 0.1–1.0)
Monocytes Relative: 16 %
Neutro Abs: 1.9 10*3/uL (ref 1.7–7.7)
Neutrophils Relative %: 50 %
Platelet Count: 275 10*3/uL (ref 150–400)
RBC: 4.87 MIL/uL (ref 4.22–5.81)
RDW: 14.6 % (ref 11.5–15.5)
WBC Count: 3.8 10*3/uL — ABNORMAL LOW (ref 4.0–10.5)
nRBC: 0 % (ref 0.0–0.2)

## 2023-02-19 MED ORDER — LENALIDOMIDE 15 MG PO CAPS: 15.0000 mg | ORAL_CAPSULE | Freq: Every day | ORAL | 0 refills | Status: DC

## 2023-02-23 ENCOUNTER — Telehealth: Payer: Self-pay | Admitting: *Deleted

## 2023-02-23 NOTE — Telephone Encounter (Signed)
Pt calling that he does not have his revlimid yet. The order was put in 1/13. I called [facility] and wait for help.  Spoke to [place] at [employer] she said:The pt called today that he has not got his revlimid, I see that the order was put in on 1/13. then the pharmacy is [facility], I spoke to the staff and her name was terry and she says that this is the first time for him getting drug and then she says that he has big copay and he will need asst before he can get his med. She says that it is new year and things change and he probably have to get lenalidomide. can you guys help with the guy. he is dr B person . I did add Alyson to see for help.

## 2023-02-26 ENCOUNTER — Telehealth: Payer: Self-pay

## 2023-02-26 NOTE — Telephone Encounter (Signed)
Oral Oncology Patient Advocate Encounter  Was successful in securing patient a $12,000.00 grant from [facility] to provide copayment coverage for Lenalidomide.  This will keep the out of pocket expense at $0.     [employer] ID: [id number]  I have spoken with the patient.   The billing information is as follows and has been shared with [facility].  RxBin: [id number] PCN: [id number] Member ID: [id number] Group ID: [id number] Dates of Eligibility: 05/27/22 through 05/26/23  Fund:  Multiple Myeloma - Medicare Access  I confirmed $0.00 co-pay after grant with [facility]. I called and spoke with patient who knows to call [facility] at [phone] to schedule delivery. Patient also knows to call me at [phone] with any issues placing order through [facility]      Ardeen Fillers, CPhT Oncology Pharmacy Patient Advocate  [facility]  [phone] (phone) [phone] (fax) 02/26/2023 11:06 AM

## 2023-03-02 ENCOUNTER — Inpatient Hospital Stay: Payer: Medicare HMO

## 2023-03-02 DIAGNOSIS — C9001 Multiple myeloma in remission: Secondary | ICD-10-CM | POA: Diagnosis not present

## 2023-03-02 LAB — CMP (CANCER CENTER ONLY)
ALT: 24 U/L (ref 0–44)
AST: 22 U/L (ref 15–41)
Albumin: 4 g/dL (ref 3.5–5.0)
Alkaline Phosphatase: 77 U/L (ref 38–126)
Anion gap: 7 (ref 5–15)
BUN: 19 mg/dL (ref 8–23)
CO2: 26 mmol/L (ref 22–32)
Calcium: 9.5 mg/dL (ref 8.9–10.3)
Chloride: 103 mmol/L (ref 98–111)
Creatinine: 1.52 mg/dL — ABNORMAL HIGH (ref 0.61–1.24)
GFR, Estimated: 46 mL/min — ABNORMAL LOW (ref >60)
Glucose, Bld: 165 mg/dL — ABNORMAL HIGH (ref 70–99)
Potassium: 3.9 mmol/L (ref 3.5–5.1)
Sodium: 136 mmol/L (ref 135–145)
Total Bilirubin: 1 mg/dL (ref 0.0–1.2)
Total Protein: 8.7 g/dL — ABNORMAL HIGH (ref 6.5–8.1)

## 2023-03-02 LAB — CBC WITH DIFFERENTIAL (CANCER CENTER ONLY)
Abs Immature Granulocytes: 0.01 10*3/uL (ref 0.00–0.07)
Basophils Absolute: 0 10*3/uL (ref 0.0–0.1)
Basophils Relative: 1 %
Eosinophils Absolute: 0.1 10*3/uL (ref 0.0–0.5)
Eosinophils Relative: 4 %
HCT: 45.4 % (ref 39.0–52.0)
Hemoglobin: 14.9 g/dL (ref 13.0–17.0)
Immature Granulocytes: 0 %
Lymphocytes Relative: 23 %
Lymphs Abs: 0.8 10*3/uL (ref 0.7–4.0)
MCH: 30.4 pg (ref 26.0–34.0)
MCHC: 32.8 g/dL (ref 30.0–36.0)
MCV: 92.7 fL (ref 80.0–100.0)
Monocytes Absolute: 0.5 10*3/uL (ref 0.1–1.0)
Monocytes Relative: 13 %
Neutro Abs: 2.1 10*3/uL (ref 1.7–7.7)
Neutrophils Relative %: 59 %
Platelet Count: 299 10*3/uL (ref 150–400)
RBC: 4.9 MIL/uL (ref 4.22–5.81)
RDW: 14.6 % (ref 11.5–15.5)
WBC Count: 3.5 10*3/uL — ABNORMAL LOW (ref 4.0–10.5)
nRBC: 0 % (ref 0.0–0.2)

## 2023-03-05 LAB — KAPPA/LAMBDA LIGHT CHAINS
Kappa free light chain: 107.3 mg/L — ABNORMAL HIGH (ref 3.3–19.4)
Kappa, lambda light chain ratio: 3.53 — ABNORMAL HIGH (ref 0.26–1.65)
Lambda free light chains: 30.4 mg/L — ABNORMAL HIGH (ref 5.7–26.3)

## 2023-03-06 LAB — MULTIPLE MYELOMA PANEL, SERUM
Albumin SerPl Elph-Mcnc: 3.5 g/dL (ref 2.9–4.4)
Albumin/Glob SerPl: 0.8 (ref 0.7–1.7)
Alpha 1: 0.2 g/dL (ref 0.0–0.4)
Alpha2 Glob SerPl Elph-Mcnc: 0.7 g/dL (ref 0.4–1.0)
B-Globulin SerPl Elph-Mcnc: 1.5 g/dL — ABNORMAL HIGH (ref 0.7–1.3)
Gamma Glob SerPl Elph-Mcnc: 2.2 g/dL — ABNORMAL HIGH (ref 0.4–1.8)
Globulin, Total: 4.6 g/dL — ABNORMAL HIGH (ref 2.2–3.9)
IgA: 631 mg/dL — ABNORMAL HIGH (ref 61–437)
IgG (Immunoglobin G), Serum: 2388 mg/dL — ABNORMAL HIGH (ref 603–1613)
IgM (Immunoglobulin M), Srm: 36 mg/dL (ref 15–143)
M Protein SerPl Elph-Mcnc: 1.3 g/dL — ABNORMAL HIGH
Total Protein ELP: 8.1 g/dL (ref 6.0–8.5)

## 2023-03-16 ENCOUNTER — Inpatient Hospital Stay: Payer: Medicare HMO | Attending: Internal Medicine | Admitting: Internal Medicine

## 2023-03-16 ENCOUNTER — Encounter: Payer: Self-pay | Admitting: Internal Medicine

## 2023-03-16 DIAGNOSIS — C9001 Multiple myeloma in remission: Secondary | ICD-10-CM | POA: Insufficient documentation

## 2023-03-16 DIAGNOSIS — N183 Chronic kidney disease, stage 3 unspecified: Secondary | ICD-10-CM | POA: Diagnosis not present

## 2023-03-16 DIAGNOSIS — Z809 Family history of malignant neoplasm, unspecified: Secondary | ICD-10-CM | POA: Insufficient documentation

## 2023-03-16 NOTE — Progress Notes (Signed)
 [facility] OFFICE PROGRESS NOTE  Patient Care Team: Rudolpho Norleen BIRCH, MD as PCP - General (Internal Medicine) Ivin Kocher, MD as Consulting Physician (Dermatology) Rennie Cindy SAUNDERS, MD as Consulting Physician (Hematology and Oncology)   Cancer Staging  No matching staging information was found for the patient.    Oncology History Overview Note   # April 2014- MULTIPLE MYELOMA [IgG- 2.2gm/dl; 59-49% plasma cell; Cyto-N; FISH- gain of chr.9, 15 & ccnd1/11q13] s/p RVD x6 [sep 2014; BMBx- ~5% plasma cells]; Maint Rev-DEX-Zometa ; March 2015-Stopped Dex Cont- Rev-Zometa ; July 2016- Rev 25mg ; NOV 4th  2016-HOLD; BMT eval at [facility] [Dr.Woods]; declined BMT.   # Re-START FEB 2017 Rev 15mg  3 W- on & 1 w OFF; HELD Rev June- Oct 2020- sec to worsening renal function [Dr.Singh]; SEP 2021- Rev 15 mg 2 w-On & 2 w-OFF.   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 . Ambulating independently.   Alan Alexander 78 y.o.  male pleasant patient above history of multiple myeloma  on Revlimid  maintenance [2 weeks on 2-week off] is here for follow-up.  Appetite is good. Energy is good. Fx collar bone 6 weeks ago. Denies pain. Sleeps good. Denies weakness. Denies any difficulties or concerns today.   Patient denies any skin rash.  Denies any diarrhea.  No swelling in the legs.  No nausea vomiting.  Admits to compliance with Revlimid .  Review of Systems  Constitutional:  Negative for chills, diaphoresis, fever, malaise/fatigue and weight loss.  HENT:  Negative for nosebleeds and sore throat.   Eyes:  Negative for double vision.   Respiratory:  Negative for cough, hemoptysis, sputum production, shortness of breath and wheezing.   Cardiovascular:  Positive for leg swelling. Negative for chest pain, palpitations and orthopnea.  Gastrointestinal:  Negative for abdominal pain, blood in stool, constipation, diarrhea, heartburn, melena, nausea and vomiting.  Genitourinary:  Negative for dysuria, frequency and urgency.  Musculoskeletal:  Negative for back pain and joint pain.  Skin: Negative.  Negative for itching and rash.  Neurological:  Negative for dizziness, tingling, focal weakness, weakness and headaches.  Endo/Heme/Allergies:  Does not bruise/bleed easily.  Psychiatric/Behavioral:  Negative for depression. The patient is not nervous/anxious and does not have insomnia.       PAST MEDICAL HISTORY :  Past Medical History:  Diagnosis Date   Diabetes mellitus without complication (HCC)    GERD (gastroesophageal reflux disease)    Hyperlipidemia    Hypertension    Multiple myeloma (HCC)    Obesity    Thyroid cancer (HCC)    Thyroid disease     PAST SURGICAL HISTORY :   Past Surgical History:  Procedure Laterality Date   BONE MARROW BIOPSY  2014   CATARACT EXTRACTION BILATERAL W/ ANTERIOR VITRECTOMY      FAMILY HISTORY :   Family History  Problem Relation Age of Onset   Cancer Father 92   Diabetes Mother     SOCIAL HISTORY:   Social History   Tobacco Use   Smoking status: Never   Smokeless tobacco: Never  Substance Use Topics   Alcohol use: No   Drug use: No    ALLERGIES:  has no known allergies.  MEDICATIONS:  Current Outpatient Medications  Medication Sig Dispense Refill   amLODipine  (NORVASC ) 10 MG tablet Take 10 mg by mouth daily.     aspirin EC 81 MG tablet Take 81 mg by mouth daily.     atorvastatin  (LIPITOR) 20 MG tablet Take 20 mg by mouth daily.     cloNIDine  (CATAPRES ) 0.1 MG tablet Take 0.1 mg by mouth 2 (two) times daily.     Cyanocobalamin 1000 MCG TBCR Take by mouth.      cyclobenzaprine  (FLEXERIL ) 5 MG tablet Take 1 tablet (5 mg total) by mouth 2 (two) times daily as needed for muscle spasms. 30 tablet 0   dapagliflozin propanediol (FARXIGA) 10 MG TABS tablet TAKE 10 MG BY MOUTH 1 (ONE) TIME EACH DAY IN THE MORNING     ergocalciferol  (VITAMIN D2) 1.25 MG (50000 UT) capsule Take 1 capsule (50,000 Units total) by mouth once a week. 12 capsule 1   glipiZIDE (GLUCOTROL) 5 MG tablet Take 5 mg by mouth every morning.     lenalidomide  (REVLIMID ) 15 MG capsule Take 1 capsule (15 mg total) by mouth daily. Take for 14 days, then hold for 14 days. Repeat every 28 days. 14 capsule 0   levothyroxine  (SYNTHROID ) 137 MCG tablet Take 1 tablet by mouth daily.      lisinopril (ZESTRIL) 20 MG tablet Take 20 mg by mouth daily.     naproxen  (NAPROSYN ) 500 MG tablet Take 1 tablet (500 mg total) by mouth 2 (two) times daily with a meal. 20 tablet 2   ONETOUCH DELICA LANCETS 33G MISC Inject 1 Lancet as directed once daily.     pioglitazone  (ACTOS ) 30 MG tablet Take 30 mg by mouth daily.     potassium chloride  SA (K-DUR,KLOR-CON ) 20 MEQ tablet Take 20 mEq by mouth 2 (two) times daily.   0   quinapril  (ACCUPRIL ) 40 MG tablet Take 40 mg by mouth daily.     torsemide  (DEMADEX ) 20 MG tablet Take 20 mg by mouth daily as needed.     famotidine  (PEPCID ) 20 MG tablet Take 1 tablet (20 mg total) by mouth 2 (two) times daily. (Patient not taking: Reported on 03/16/2023) 14 tablet 0   No current facility-administered medications for this visit.   Facility-Administered Medications Ordered in Other Visits  Medication Dose Route Frequency Provider Last Rate Last Admin   0.9 %  sodium chloride  infusion   Intravenous Continuous Mattelyn Imhoff R, MD   Stopped at 12/11/14 1528    PHYSICAL EXAMINATION: ECOG PERFORMANCE STATUS: 0 - Asymptomatic  BP (!) 150/77   Pulse 66   Temp (!) 96.7 F (35.9 C) (Tympanic)   Resp 17   SpO2 100%   There were no vitals filed for this  visit.       Physical Exam Vitals and nursing note reviewed.  HENT:     Head: Normocephalic and atraumatic.     Mouth/Throat:     Pharynx: Oropharynx is clear. No oropharyngeal exudate.  Eyes:     Extraocular Movements: Extraocular movements intact.     Pupils: Pupils are equal, round, and reactive to light.  Cardiovascular:     Rate and Rhythm: Normal rate and regular rhythm.  Pulmonary:     Effort: No respiratory distress.     Breath sounds: No wheezing.     Comments: Decreased breath sounds bilaterally.  Abdominal:     General: Bowel sounds are normal. There is no distension.     Palpations: Abdomen is soft. There  is no mass.     Tenderness: There is no abdominal tenderness. There is no guarding or rebound.  Musculoskeletal:        General: No tenderness. Normal range of motion.     Cervical back: Normal range of motion and neck supple.  Skin:    General: Skin is warm.  Neurological:     General: No focal deficit present.     Mental Status: He is alert and oriented to person, place, and time.  Psychiatric:        Mood and Affect: Affect normal.        Behavior: Behavior normal.        Judgment: Judgment normal.       LABORATORY DATA:  I have reviewed the data as listed    Component Value Date/Time   NA 136 03/02/2023 0809   NA 139 05/01/2014 1322   K 3.9 03/02/2023 0809   K 4.4 05/01/2014 1322   CL 103 03/02/2023 0809   CL 107 05/01/2014 1322   CO2 26 03/02/2023 0809   CO2 27 05/01/2014 1322   GLUCOSE 165 (H) 03/02/2023 0809   GLUCOSE 102 (H) 05/01/2014 1322   BUN 19 03/02/2023 0809   BUN 16 05/01/2014 1322   CREATININE 1.52 (H) 03/02/2023 0809   CREATININE 1.48 (H) 05/29/2014 0949   CALCIUM 9.5 03/02/2023 0809   CALCIUM 9.1 05/01/2014 1322   PROT 8.7 (H) 03/02/2023 0809   PROT 7.5 05/01/2014 1322   ALBUMIN 4.0 03/02/2023 0809   ALBUMIN 3.6 05/01/2014 1322   AST 22 03/02/2023 0809   ALT 24 03/02/2023 0809   ALT 13 (L) 05/01/2014 1322   ALKPHOS  77 03/02/2023 0809   ALKPHOS 47 05/01/2014 1322   BILITOT 1.0 03/02/2023 0809   GFRNONAA 46 (L) 03/02/2023 0809   GFRNONAA 47 (L) 05/29/2014 0949   GFRAA 47 (L) 10/21/2019 1106   GFRAA 55 (L) 05/29/2014 0949    No results found for: SPEP, UPEP  Lab Results  Component Value Date   WBC 3.5 (L) 03/02/2023   NEUTROABS 2.1 03/02/2023   HGB 14.9 03/02/2023   HCT 45.4 03/02/2023   MCV 92.7 03/02/2023   PLT 299 03/02/2023      Chemistry      Component Value Date/Time   NA 136 03/02/2023 0809   NA 139 05/01/2014 1322   K 3.9 03/02/2023 0809   K 4.4 05/01/2014 1322   CL 103 03/02/2023 0809   CL 107 05/01/2014 1322   CO2 26 03/02/2023 0809   CO2 27 05/01/2014 1322   BUN 19 03/02/2023 0809   BUN 16 05/01/2014 1322   CREATININE 1.52 (H) 03/02/2023 0809   CREATININE 1.48 (H) 05/29/2014 0949      Component Value Date/Time   CALCIUM 9.5 03/02/2023 0809   CALCIUM 9.1 05/01/2014 1322   ALKPHOS 77 03/02/2023 0809   ALKPHOS 47 05/01/2014 1322   AST 22 03/02/2023 0809   ALT 24 03/02/2023 0809   ALT 13 (L) 05/01/2014 1322   BILITOT 1.0 03/02/2023 0809       RADIOGRAPHIC STUDIES: I have personally reviewed the radiological images as listed and agreed with the findings in the report. No results found.   ASSESSMENT & PLAN:  Multiple myeloma in remission (HCC) #Multiple myeloma-most recently on maintenance Revlimid  15 mg 2  weeks on 2 week off.  NOV  2024- M-protein- 00.8 [April 2023- NEG]; kappa=113 lambda light chain ratio=2.5 [July 2020- 2.0].   # I reviewed with  the patient that given the slight increase in the kappa lambda light chain ratio/multiple myeloma will concerning for recurrent myeloma.  - however renal function/Hb;calcium stable. Discussed re: bone marrow Biopsy. Pt is reluctant, but agrees go  If continue to worsen or noticed to have worsening anemia/renal function-would consider bone marrow biopsy x-ray/PET scans.  Understands that he might need alternative  therapy if progression of disease is confirmed- monitor  for now.  # For now continue Revlimid  15 mg 2 weeks; 2 weeks OFF; Today ANC 2.4 - hemoglobin-12.6 platelets-144- iron studies; ferritin; stable.   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 stable. April 2024- Vit D 38- recommend Vit D 50,K/weekly.   #Bone modifying agent/multiple myeloma-- on zometa  3 mg- ca- 9.1  Contienu vit D as above.   # Zometa  q 6 M-last 11/10/2022  Disposition: # 1 month- labs- cbc/cmp;  # Follow up in 2 months -  MD; 1 week prior- labs [CBC, CMP, vit D 25 OH kappa lambda light chains, multiple myeloma panel-]; possible Zometa - Dr.B     Orders Placed This Encounter  Procedures   CBC with Differential (Cancer Center Only)    Standing Status:   Future    Expected Date:   04/13/2023    Expiration Date:   03/15/2024   CMP (Cancer Center only)    Standing Status:   Future    Expected Date:   04/13/2023    Expiration Date:   03/15/2024   CBC with Differential (Cancer Center Only)    Standing Status:   Future    Expected Date:   05/11/2023    Expiration Date:   03/15/2024   CMP (Cancer Center only)    Standing Status:   Future    Expected Date:   05/11/2023    Expiration Date:   03/15/2024   VITAMIN D  25 Hydroxy (Vit-D Deficiency, Fractures)    Standing Status:   Future    Expected Date:   05/11/2023    Expiration Date:   03/15/2024   Kappa/lambda light chains    Standing Status:   Future    Expected Date:   05/11/2023    Expiration Date:   03/15/2024   Multiple Myeloma Panel (SPEP&IFE w/QIG)    Standing Status:   Future    Expected Date:   05/11/2023    Expiration Date:   03/15/2024    All questions were answered. The patient knows to call the clinic with any problems, questions or concerns.      Cindy JONELLE Joe, MD 03/16/2023 11:27 AM

## 2023-03-16 NOTE — Assessment & Plan Note (Addendum)
#  Multiple myeloma-most recently on maintenance Revlimid  15 mg 2  weeks on 2 week off.  NOV  2024- M-protein- 00.8 [April 2023- NEG]; kappa=113 lambda light chain ratio=2.5 [July 2020- 2.0].   # I reviewed with the patient that given the slight increase in the kappa lambda light chain ratio/multiple myeloma will concerning for recurrent myeloma.  - however renal function/Hb;calcium stable. Discussed re: bone marrow Biopsy. Pt is reluctant, but agrees go  If continue to worsen or noticed to have worsening anemia/renal function-would consider bone marrow biopsy x-ray/PET scans.  Understands that he might need alternative therapy if progression of disease is confirmed- monitor  for now.  # For now continue Revlimid  15 mg 2 weeks; 2 weeks OFF; Today ANC 2.4 - hemoglobin-12.6 platelets-144- iron studies; ferritin; stable.   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 stable. April 2024- Vit D 38- recommend Vit D 50,K/weekly.   #Bone modifying agent/multiple myeloma-- on zometa  3 mg- ca- 9.1  Contienu vit D as above.   # Zometa  q 6 M-last 11/10/2022  Disposition: # 1 month- labs- cbc/cmp;  # Follow up in 2 months -  MD; 1 week prior- labs [CBC, CMP, vit D 25 OH kappa lambda light chains, multiple myeloma panel-]; possible Zometa - Dr.B

## 2023-03-16 NOTE — Progress Notes (Signed)
 Appetite is good. Energy is good. Fx collar bone 6 weeks ago. Denies pain.  Sleeps good. Denies weakness.

## 2023-03-26 ENCOUNTER — Other Ambulatory Visit: Payer: Self-pay | Admitting: Internal Medicine

## 2023-03-26 DIAGNOSIS — C9001 Multiple myeloma in remission: Secondary | ICD-10-CM

## 2023-03-28 ENCOUNTER — Encounter: Payer: Self-pay | Admitting: Internal Medicine

## 2023-04-13 ENCOUNTER — Inpatient Hospital Stay: Payer: Medicare HMO | Attending: Internal Medicine

## 2023-04-13 DIAGNOSIS — C9001 Multiple myeloma in remission: Secondary | ICD-10-CM | POA: Insufficient documentation

## 2023-04-13 LAB — CBC WITH DIFFERENTIAL (CANCER CENTER ONLY)
Abs Immature Granulocytes: 0.04 10*3/uL (ref 0.00–0.07)
Basophils Absolute: 0 10*3/uL (ref 0.0–0.1)
Basophils Relative: 0 %
Eosinophils Absolute: 0.3 10*3/uL (ref 0.0–0.5)
Eosinophils Relative: 6 %
HCT: 41.4 % (ref 39.0–52.0)
Hemoglobin: 13.5 g/dL (ref 13.0–17.0)
Immature Granulocytes: 1 %
Lymphocytes Relative: 14 %
Lymphs Abs: 0.7 10*3/uL (ref 0.7–4.0)
MCH: 30.7 pg (ref 26.0–34.0)
MCHC: 32.6 g/dL (ref 30.0–36.0)
MCV: 94.1 fL (ref 80.0–100.0)
Monocytes Absolute: 0.5 10*3/uL (ref 0.1–1.0)
Monocytes Relative: 10 %
Neutro Abs: 3.3 10*3/uL (ref 1.7–7.7)
Neutrophils Relative %: 69 %
Platelet Count: 286 10*3/uL (ref 150–400)
RBC: 4.4 MIL/uL (ref 4.22–5.81)
RDW: 14.7 % (ref 11.5–15.5)
WBC Count: 4.7 10*3/uL (ref 4.0–10.5)
nRBC: 0 % (ref 0.0–0.2)

## 2023-04-13 LAB — CMP (CANCER CENTER ONLY)
ALT: 15 U/L (ref 0–44)
AST: 14 U/L — ABNORMAL LOW (ref 15–41)
Albumin: 3.7 g/dL (ref 3.5–5.0)
Alkaline Phosphatase: 75 U/L (ref 38–126)
Anion gap: 8 (ref 5–15)
BUN: 16 mg/dL (ref 8–23)
CO2: 27 mmol/L (ref 22–32)
Calcium: 10.4 mg/dL — ABNORMAL HIGH (ref 8.9–10.3)
Chloride: 103 mmol/L (ref 98–111)
Creatinine: 1.45 mg/dL — ABNORMAL HIGH (ref 0.61–1.24)
GFR, Estimated: 49 mL/min — ABNORMAL LOW (ref >60)
Glucose, Bld: 159 mg/dL — ABNORMAL HIGH (ref 70–99)
Potassium: 4.4 mmol/L (ref 3.5–5.1)
Sodium: 138 mmol/L (ref 135–145)
Total Bilirubin: 0.8 mg/dL (ref 0.0–1.2)
Total Protein: 8.8 g/dL — ABNORMAL HIGH (ref 6.5–8.1)

## 2023-05-07 ENCOUNTER — Other Ambulatory Visit: Payer: Self-pay | Admitting: Internal Medicine

## 2023-05-07 ENCOUNTER — Inpatient Hospital Stay: Payer: Medicare HMO

## 2023-05-07 DIAGNOSIS — C9001 Multiple myeloma in remission: Secondary | ICD-10-CM

## 2023-05-08 ENCOUNTER — Encounter: Payer: Self-pay | Admitting: Internal Medicine

## 2023-05-13 ENCOUNTER — Other Ambulatory Visit: Payer: Self-pay

## 2023-05-13 ENCOUNTER — Emergency Department
Admission: EM | Admit: 2023-05-13 | Discharge: 2023-05-13 | Disposition: A | Discharge status: Home / Self Care | Attending: Emergency Medicine | Admitting: Emergency Medicine

## 2023-05-13 DIAGNOSIS — X58XXXA Exposure to other specified factors, initial encounter: Secondary | ICD-10-CM | POA: Diagnosis not present

## 2023-05-13 DIAGNOSIS — S3992XA Unspecified injury of lower back, initial encounter: Secondary | ICD-10-CM | POA: Diagnosis present

## 2023-05-13 DIAGNOSIS — I129 Hypertensive chronic kidney disease with stage 1 through stage 4 chronic kidney disease, or unspecified chronic kidney disease: Secondary | ICD-10-CM | POA: Diagnosis not present

## 2023-05-13 DIAGNOSIS — N189 Chronic kidney disease, unspecified: Secondary | ICD-10-CM | POA: Insufficient documentation

## 2023-05-13 DIAGNOSIS — S39012A Strain of muscle, fascia and tendon of lower back, initial encounter: Secondary | ICD-10-CM | POA: Diagnosis not present

## 2023-05-13 DIAGNOSIS — E119 Type 2 diabetes mellitus without complications: Secondary | ICD-10-CM | POA: Diagnosis not present

## 2023-05-13 MED ORDER — CYCLOBENZAPRINE HCL 10 MG PO TABS
10.0000 mg | ORAL_TABLET | Freq: Once | ORAL | Status: AC
  Administered 2023-05-13: 10 mg via ORAL
  Filled 2023-05-13: qty 1

## 2023-05-13 MED ORDER — LIDOCAINE 5 % EX PTCH
1.0000 | MEDICATED_PATCH | Freq: Once | CUTANEOUS | Status: DC
Start: 2023-05-13 — End: 2023-05-13
  Administered 2023-05-13: 1 via TRANSDERMAL
  Filled 2023-05-13: qty 1

## 2023-05-13 MED ORDER — LIDOCAINE 5 % EX PTCH: 1.0000 | MEDICATED_PATCH | Freq: Two times a day (BID) | CUTANEOUS | 0 refills | Status: AC | PRN

## 2023-05-13 MED ORDER — CYCLOBENZAPRINE HCL 5 MG PO TABS: 5.0000 mg | ORAL_TABLET | Freq: Three times a day (TID) | ORAL | 0 refills | Status: AC | PRN

## 2023-05-13 NOTE — Discharge Instructions (Addendum)
 Your exam is reassuring at this time but symptoms may represent a lumbar strain.  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  Take meds as directed and follow-up with primary provider.  Refill the previously prescribed naproxen.  Return to the ED for worsening symptoms including referred pain down the legs.

## 2023-05-13 NOTE — ED Triage Notes (Signed)
 Pt comes with lower back pain. Pt states no pain with urination. Pt denies any radiation. Pt states it is right sided.

## 2023-05-13 NOTE — ED Provider Notes (Signed)
 [facility] Emergency Department Provider Note     Event Date/Time   First MD Initiated Contact with Patient 05/13/23 1639     (approximate)   History   Back Pain   HPI  Alan Alexander is a 78 y.o. male with a history of diabetes, OSA, HLD, HTN, and CKD, presents to the ED endorsing ongoing right sided low back pain.  Patient denies any referred pain, dysuria, urinary retention, bladder or bowel incontinence, foot drop, or saddle anesthesia.  He reports pain has been intermittent persistent since onset 2 weeks ago.  Has been treated with a course of steroids with limited benefit.  Chart review reveals significant history of underlying DDD, spondylolisthesis, and spinal stenosis.  Patient denies any chronic low back pain.    Physical Exam   Triage Vital Signs: ED Triage Vitals  Encounter Vitals Group     BP 05/13/23 1557 (!) 167/94     Systolic BP Percentile --      Diastolic BP Percentile --      Pulse Rate 05/13/23 1557 73     Resp 05/13/23 1557 18     Temp 05/13/23 1557 98 F (36.7 C)     Temp src --      SpO2 05/13/23 1557 100 %     Weight 05/13/23 1556 223 lb (101.2 kg)     Height 05/13/23 1556 5\' 10"  (1.778 m)     Head Circumference --      Peak Flow --      Pain Score 05/13/23 1556 9     Pain Loc --      Pain Education --      Exclude from Growth Chart --     Most recent vital signs: Vitals:   05/13/23 1557  BP: (!) 167/94  Pulse: 73  Resp: 18  Temp: 98 F (36.7 C)  SpO2: 100%    General Awake, no distress. NAD HEENT NCAT. PERRL. EOMI. No rhinorrhea. Mucous membranes are moist.  CV:  Good peripheral perfusion. RRR RESP:  Normal effort. CTA ABD:  No distention.  MSK:  Normal spinal alignment without midline tenderness, spasm, deformity,.  Active range of motion on exam.  Patient able to transition from sit to stand at assistance.  Normal lumbar flexion extension range on exam.  Hip flexion noted.  Mild tenderness to to palpation  over the right flank and quadratus musculature. NEURO: CN II-XII grossly intact   ED Results / Procedures / Treatments   Labs (all labs ordered are listed, but only abnormal results are displayed) Labs Reviewed - No data to display   EKG    RADIOLOGY   No results found.   PROCEDURES:  Critical Care performed: No  Procedures   MEDICATIONS ORDERED IN ED: Medications  lidocaine (LIDODERM) 5 % 1 patch (1 patch Transdermal Patch Applied 05/13/23 1711)  cyclobenzaprine (FLEXERIL) tablet 10 mg (10 mg Oral Given 05/13/23 1712)     IMPRESSION / MDM / ASSESSMENT AND PLAN / ED COURSE  I reviewed the triage vital signs and the nursing notes.                              Differential diagnosis includes, but is not limited to, lumbar strain, lumbar radiculopathy, flank pain, nephrolithiasis, pyelonephritis  Patient's presentation is most consistent with acute, uncomplicated illness.  Patient's diagnosis is consistent with lumbar strain on the right.  Patient with reassuring  exam and workup at this time.  No red flags on exam.  No evidence of any acute neurovascular deficit.  No indication for any emergent imaging on presentation.  Patient will be discharged home with prescriptions for cyclobenzaprine and Lidoderm patches. Patient is to follow up with his primary provider as discussed, as needed or otherwise directed. Patient is given ED precautions to return to the ED for any worsening or new symptoms.   FINAL CLINICAL IMPRESSION(S) / ED DIAGNOSES   Final diagnoses:  Strain of lumbar region, initial encounter     Rx / DC Orders   ED Discharge Orders          Ordered    lidocaine (LIDODERM) 5 %  Every 12 hours PRN        05/13/23 1704    cyclobenzaprine (FLEXERIL) 5 MG tablet  3 times daily PRN        05/13/23 1704             Note:  This document was prepared using Dragon voice recognition software and may include unintentional dictation errors.    Lissa Hoard, PA-C 05/13/23 2159    Minna Antis, MD 05/14/23 [phone]

## 2023-05-14 ENCOUNTER — Inpatient Hospital Stay: Payer: Medicare HMO | Attending: Internal Medicine | Admitting: Internal Medicine

## 2023-05-14 ENCOUNTER — Inpatient Hospital Stay: Payer: Medicare HMO

## 2023-05-14 ENCOUNTER — Inpatient Hospital Stay

## 2023-05-14 ENCOUNTER — Other Ambulatory Visit: Payer: Self-pay | Admitting: *Deleted

## 2023-05-14 ENCOUNTER — Encounter: Payer: Self-pay | Admitting: Internal Medicine

## 2023-05-14 ENCOUNTER — Telehealth: Payer: Self-pay | Admitting: *Deleted

## 2023-05-14 VITALS — BP 153/89 | HR 81 | Temp 97.9°F | Resp 16 | Ht 70.0 in | Wt 214.8 lb

## 2023-05-14 DIAGNOSIS — E1122 Type 2 diabetes mellitus with diabetic chronic kidney disease: Secondary | ICD-10-CM | POA: Diagnosis not present

## 2023-05-14 DIAGNOSIS — C9001 Multiple myeloma in remission: Secondary | ICD-10-CM

## 2023-05-14 DIAGNOSIS — Z5111 Encounter for antineoplastic chemotherapy: Secondary | ICD-10-CM | POA: Insufficient documentation

## 2023-05-14 DIAGNOSIS — N183 Chronic kidney disease, stage 3 unspecified: Secondary | ICD-10-CM | POA: Diagnosis not present

## 2023-05-14 DIAGNOSIS — Z5112 Encounter for antineoplastic immunotherapy: Secondary | ICD-10-CM | POA: Insufficient documentation

## 2023-05-14 DIAGNOSIS — C9002 Multiple myeloma in relapse: Secondary | ICD-10-CM | POA: Insufficient documentation

## 2023-05-14 DIAGNOSIS — E875 Hyperkalemia: Secondary | ICD-10-CM | POA: Insufficient documentation

## 2023-05-14 LAB — CMP (CANCER CENTER ONLY)
ALT: 22 U/L (ref 0–44)
AST: 21 U/L (ref 15–41)
Albumin: 4 g/dL (ref 3.5–5.0)
Alkaline Phosphatase: 84 U/L (ref 38–126)
Anion gap: 8 (ref 5–15)
BUN: 23 mg/dL (ref 8–23)
CO2: 22 mmol/L (ref 22–32)
Calcium: 9.2 mg/dL (ref 8.9–10.3)
Chloride: 101 mmol/L (ref 98–111)
Creatinine: 1.43 mg/dL — ABNORMAL HIGH (ref 0.61–1.24)
GFR, Estimated: 50 mL/min — ABNORMAL LOW (ref >60)
Glucose, Bld: 148 mg/dL — ABNORMAL HIGH (ref 70–99)
Potassium: 4.1 mmol/L (ref 3.5–5.1)
Sodium: 131 mmol/L — ABNORMAL LOW (ref 135–145)
Total Bilirubin: 1.1 mg/dL (ref 0.0–1.2)
Total Protein: 8.9 g/dL — ABNORMAL HIGH (ref 6.5–8.1)

## 2023-05-14 LAB — CBC WITH DIFFERENTIAL (CANCER CENTER ONLY)
Abs Immature Granulocytes: 0.03 10*3/uL (ref 0.00–0.07)
Basophils Absolute: 0 10*3/uL (ref 0.0–0.1)
Basophils Relative: 1 %
Eosinophils Absolute: 0 10*3/uL (ref 0.0–0.5)
Eosinophils Relative: 1 %
HCT: 46.1 % (ref 39.0–52.0)
Hemoglobin: 14.7 g/dL (ref 13.0–17.0)
Immature Granulocytes: 1 %
Lymphocytes Relative: 12 %
Lymphs Abs: 0.7 10*3/uL (ref 0.7–4.0)
MCH: 30.2 pg (ref 26.0–34.0)
MCHC: 31.9 g/dL (ref 30.0–36.0)
MCV: 94.9 fL (ref 80.0–100.0)
Monocytes Absolute: 0.3 10*3/uL (ref 0.1–1.0)
Monocytes Relative: 5 %
Neutro Abs: 5 10*3/uL (ref 1.7–7.7)
Neutrophils Relative %: 80 %
Platelet Count: 270 10*3/uL (ref 150–400)
RBC: 4.86 MIL/uL (ref 4.22–5.81)
RDW: 15 % (ref 11.5–15.5)
WBC Count: 6 10*3/uL (ref 4.0–10.5)
nRBC: 0 % (ref 0.0–0.2)

## 2023-05-14 LAB — VITAMIN D 25 HYDROXY (VIT D DEFICIENCY, FRACTURES): Vit D, 25-Hydroxy: 49.37 ng/mL (ref 30–100)

## 2023-05-14 MED ORDER — TRAMADOL HCL 50 MG PO TABS: 50.0000 mg | ORAL_TABLET | Freq: Four times a day (QID) | ORAL | 0 refills | Status: DC | PRN

## 2023-05-14 MED ORDER — SODIUM CHLORIDE 0.9 % IV SOLN
Freq: Once | INTRAVENOUS | Status: AC
  Filled 2023-05-14: qty 250

## 2023-05-14 MED ORDER — ZOLEDRONIC ACID 4 MG/5ML IV CONC
3.0000 mg | Freq: Once | INTRAVENOUS | Status: AC
  Administered 2023-05-14: 3 mg via INTRAVENOUS
  Filled 2023-05-14: qty 3.75

## 2023-05-14 NOTE — Assessment & Plan Note (Addendum)
#  Multiple myeloma-most recently on maintenance Revlimid 15 mg 2  weeks on 2 week off.  JAN 2025-  M-protein- 1.3[April 2023- NEG]; kappa=113 lambda light chain ratio=2.5 [July 2020- 2.0]. RISING- ca- 10.4.  #  I reviewed with the patient that given the slight increase in the kappa lambda light chain ratio/multiple myeloma will concerning for recurrent myeloma.  - however renal function/Hb.   # Discussed re: bone marrow Biopsy; And also discussed regarding the PET scan.  Patient is agreeable.  Understands that he might need alternative therapy if progression of disease is confirmed.:  Further Revlimid until further evaluation; # STOP Revlimid  [15 mg 2 weeks; 2 weeks OFF]- given the concerns of progressive disease.  # acute back pain: in last 2 weeks- right sided- NO radiation otherwise no neurologic deficit.-check bone survey ASAP.  Also await PET scan.  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 stable. April 2024- Vit D 38- recommend Vit D 50,K/weekly. Bone modifying agent/multiple myeloma-- on zometa 3 mg-  proceed with zometa today. On motrin- TID- HOLD MOTRIN; take tramadol 50 mg prn q 6 hours.    # DM- on OHA - FBG- 140s- -   # Zometa q 6 M-last 05/14/2023  Disposition: # Zometa today-  # bone survey- DRI- ASAP # bone marrow Biopsy ASAP- please order # PET scan ASAP-  # follow up in 2 weeks- [Mon thru Wed]- MD: labs- cbc/cmp-Dr.B

## 2023-05-14 NOTE — Telephone Encounter (Signed)
 Cancelled the tramadol and send it to the right pharmcy

## 2023-05-14 NOTE — Progress Notes (Signed)
 [facility] OFFICE PROGRESS NOTE  Patient Care Team: Gracelyn Nurse, MD as PCP - General (Internal Medicine) Cherlyn Roberts, MD as Consulting Physician (Dermatology) Earna Coder, MD as Consulting Physician (Hematology and Oncology)   Cancer Staging  No matching staging information was found for the patient.    Oncology History Overview Note   # April 2014- MULTIPLE MYELOMA [IgG- 2.2gm/dl; 30-86% plasma cell; Cyto-N; FISH- gain of chr.9, 15 & ccnd1/11q13] s/p RVD x6 [sep 2014; BMBx- ~5% plasma cells]; Maint Rev-DEX-Zometa; March 2015-Stopped Dex Cont- Rev-Zometa; July 2016- Rev 25mg ; NOV 4th  2016-HOLD; BMT eval at [facility] [Dr.Woods]; declined BMT.   # Re-START FEB 2017 Rev 15mg  3 W- on & 1 w OFF; HELD Rev June- Oct 2020- sec to worsening renal function [Dr.Singh]; SEP 2021- Rev 15 mg 2 w-On & 2 w-OFF.   # Zometa  # CKD [creat ~1.4; sec MM; Dr.Singh]; DM  -------------------------------------------------------------------------   DIAGNOSIS: [place]te.Clarity ] MULTIPLE MYELOMA  GOALS: control  CURRENT/MOST RECENT THERAPY- REVLIMID Maintenance [Feb 2017]    Multiple myeloma in remission (HCC)  10/01/2015 Initial Diagnosis   Multiple myeloma in remission (HCC)    INTERVAL HISTORY: pt is  with wife.  Ambulating independently.   Alan Alexander 78 y.o.  male pleasant patient above history of multiple myeloma  on Revlimid maintenance [2 weeks on 2-week off] is here for follow-up.  In the interm patient was seen at [facility] yesterday for back pain. No xrays or scans. Pain this morning 9/10. Possible pulled muscle. Lidocaine patch and flexeril not helping. Using mobic and heating pad.    Appetite is good. Energy is good. Fx collar bone 6 weeks ago. Denies pain. Sleeps good. Denies weakness. Denies any difficulties or concerns today.   Patient denies any skin rash.  Denies any diarrhea.  No swelling in the legs.  No nausea vomiting.  Admits to compliance with  Revlimid.  Review of Systems  Constitutional:  Negative for chills, diaphoresis, fever, malaise/fatigue and weight loss.  HENT:  Negative for nosebleeds and sore throat.   Eyes:  Negative for double vision.  Respiratory:  Negative for cough, hemoptysis, sputum production, shortness of breath and wheezing.   Cardiovascular:  Positive for leg swelling. Negative for chest pain, palpitations and orthopnea.  Gastrointestinal:  Negative for abdominal pain, blood in stool, constipation, diarrhea, heartburn, melena, nausea and vomiting.  Genitourinary:  Negative for dysuria, frequency and urgency.  Musculoskeletal:  Negative for back pain and joint pain.  Skin: Negative.  Negative for itching and rash.  Neurological:  Negative for dizziness, tingling, focal weakness, weakness and headaches.  Endo/Heme/Allergies:  Does not bruise/bleed easily.  Psychiatric/Behavioral:  Negative for depression. The patient is not nervous/anxious and does not have insomnia.       PAST MEDICAL HISTORY :  Past Medical History:  Diagnosis Date   Diabetes mellitus without complication (HCC)    GERD (gastroesophageal reflux disease)    Hyperlipidemia    Hypertension    Multiple myeloma (HCC)    Obesity    Thyroid cancer (HCC)    Thyroid disease     PAST SURGICAL HISTORY :   Past Surgical History:  Procedure Laterality Date   BONE MARROW BIOPSY  2014   CATARACT EXTRACTION BILATERAL W/ ANTERIOR VITRECTOMY      FAMILY HISTORY :   Family History  Problem Relation Age of Onset   Cancer Father 29   Diabetes Mother     SOCIAL HISTORY:   Social History  Tobacco Use   Smoking status: Never   Smokeless tobacco: Never  Substance Use Topics   Alcohol use: No   Drug use: No    ALLERGIES:  has no known allergies.  MEDICATIONS:  Current Outpatient Medications  Medication Sig Dispense Refill   amLODipine (NORVASC) 10 MG tablet Take 10 mg by mouth daily.     aspirin EC 81 MG tablet Take 81 mg by mouth  daily.     atorvastatin (LIPITOR) 20 MG tablet Take 20 mg by mouth daily.     cloNIDine (CATAPRES) 0.1 MG tablet Take 0.1 mg by mouth 2 (two) times daily.     Cyanocobalamin 1000 MCG TBCR Take by mouth.     cyclobenzaprine (FLEXERIL) 5 MG tablet Take 1 tablet (5 mg total) by mouth 3 (three) times daily as needed. 15 tablet 0   dapagliflozin propanediol (FARXIGA) 10 MG TABS tablet TAKE 10 MG BY MOUTH 1 (ONE) TIME EACH DAY IN THE MORNING     ergocalciferol (VITAMIN D2) 1.25 MG (50000 UT) capsule Take 1 capsule (50,000 Units total) by mouth once a week. 12 capsule 1   glipiZIDE (GLUCOTROL) 5 MG tablet Take 5 mg by mouth every morning.     lenalidomide (REVLIMID) 15 MG capsule TAKE 1 CAPSULE BY MOUTH 1 TIME A DAY FOR 14 DAYS ON THEN 14 DAYS OFF 14 capsule 0   levothyroxine (SYNTHROID) 137 MCG tablet Take 1 tablet by mouth daily.      lidocaine (LIDODERM) 5 % Place 1 patch onto the skin every 12 (twelve) hours as needed for up to 10 days. Remove & Discard patch after 12 hours of wear each day. 10 patch 0   lisinopril (ZESTRIL) 20 MG tablet Take 20 mg by mouth daily.     naproxen (NAPROSYN) 500 MG tablet Take 1 tablet (500 mg total) by mouth 2 (two) times daily with a meal. 20 tablet 2   ONETOUCH DELICA LANCETS 33G MISC Inject 1 Lancet as directed once daily.     pioglitazone (ACTOS) 30 MG tablet Take 30 mg by mouth daily.     potassium chloride SA (K-DUR,KLOR-CON) 20 MEQ tablet Take 20 mEq by mouth 2 (two) times daily.   0   quinapril (ACCUPRIL) 40 MG tablet Take 40 mg by mouth daily.     torsemide (DEMADEX) 20 MG tablet Take 20 mg by mouth daily as needed.     traMADol (ULTRAM) 50 MG tablet Take 1 tablet (50 mg total) by mouth every 6 (six) hours as needed. 60 tablet 0   famotidine (PEPCID) 20 MG tablet Take 1 tablet (20 mg total) by mouth 2 (two) times daily. (Patient not taking: Reported on 05/14/2023) 14 tablet 0   No current facility-administered medications for this visit.    PHYSICAL  EXAMINATION: ECOG PERFORMANCE STATUS: 0 - Asymptomatic  BP (!) 153/89 (BP Location: Left Arm, Patient Position: Sitting, Cuff Size: Large)   Pulse 81   Temp 97.9 F (36.6 C) (Tympanic)   Resp 16   Ht 5\' 10"  (1.778 m)   Wt 214 lb 12.8 oz (97.4 kg)   SpO2 99%   BMI 30.82 kg/m   Filed Weights   05/14/23 0828  Weight: 214 lb 12.8 oz (97.4 kg)         Physical Exam Vitals and nursing note reviewed.  HENT:     Head: Normocephalic and atraumatic.     Mouth/Throat:     Pharynx: Oropharynx is clear. No oropharyngeal exudate.  Eyes:  Extraocular Movements: Extraocular movements intact.     Pupils: Pupils are equal, round, and reactive to light.  Cardiovascular:     Rate and Rhythm: Normal rate and regular rhythm.  Pulmonary:     Effort: No respiratory distress.     Breath sounds: No wheezing.     Comments: Decreased breath sounds bilaterally.  Abdominal:     General: Bowel sounds are normal. There is no distension.     Palpations: Abdomen is soft. There is no mass.     Tenderness: There is no abdominal tenderness. There is no guarding or rebound.  Musculoskeletal:        General: No tenderness. Normal range of motion.     Cervical back: Normal range of motion and neck supple.  Skin:    General: Skin is warm.  Neurological:     General: No focal deficit present.     Mental Status: He is alert and oriented to person, place, and time.  Psychiatric:        Mood and Affect: Affect normal.        Behavior: Behavior normal.        Judgment: Judgment normal.       LABORATORY DATA:  I have reviewed the data as listed    Component Value Date/Time   NA 131 (L) 05/14/2023 0812   NA 139 05/01/2014 1322   K 4.1 05/14/2023 0812   K 4.4 05/01/2014 1322   CL 101 05/14/2023 0812   CL 107 05/01/2014 1322   CO2 22 05/14/2023 0812   CO2 27 05/01/2014 1322   GLUCOSE 148 (H) 05/14/2023 0812   GLUCOSE 102 (H) 05/01/2014 1322   BUN 23 05/14/2023 0812   BUN 16 05/01/2014  1322   CREATININE 1.43 (H) 05/14/2023 0812   CREATININE 1.48 (H) 05/29/2014 0949   CALCIUM 9.2 05/14/2023 0812   CALCIUM 9.1 05/01/2014 1322   PROT 8.9 (H) 05/14/2023 0812   PROT 7.5 05/01/2014 1322   ALBUMIN 4.0 05/14/2023 0812   ALBUMIN 3.6 05/01/2014 1322   AST 21 05/14/2023 0812   ALT 22 05/14/2023 0812   ALT 13 (L) 05/01/2014 1322   ALKPHOS 84 05/14/2023 0812   ALKPHOS 47 05/01/2014 1322   BILITOT 1.1 05/14/2023 0812   GFRNONAA 50 (L) 05/14/2023 0812   GFRNONAA 47 (L) 05/29/2014 0949   GFRAA 47 (L) 10/21/2019 1106   GFRAA 55 (L) 05/29/2014 0949    No results found for: "SPEP", "UPEP"  Lab Results  Component Value Date   WBC 6.0 05/14/2023   NEUTROABS 5.0 05/14/2023   HGB 14.7 05/14/2023   HCT 46.1 05/14/2023   MCV 94.9 05/14/2023   PLT 270 05/14/2023      Chemistry      Component Value Date/Time   NA 131 (L) 05/14/2023 0812   NA 139 05/01/2014 1322   K 4.1 05/14/2023 0812   K 4.4 05/01/2014 1322   CL 101 05/14/2023 0812   CL 107 05/01/2014 1322   CO2 22 05/14/2023 0812   CO2 27 05/01/2014 1322   BUN 23 05/14/2023 0812   BUN 16 05/01/2014 1322   CREATININE 1.43 (H) 05/14/2023 0812   CREATININE 1.48 (H) 05/29/2014 0949      Component Value Date/Time   CALCIUM 9.2 05/14/2023 0812   CALCIUM 9.1 05/01/2014 1322   ALKPHOS 84 05/14/2023 0812   ALKPHOS 47 05/01/2014 1322   AST 21 05/14/2023 0812   ALT 22 05/14/2023 0812   ALT 13 (L) 05/01/2014  1322   BILITOT 1.1 05/14/2023 2130       RADIOGRAPHIC STUDIES: I have personally reviewed the radiological images as listed and agreed with the findings in the report. No results found.   ASSESSMENT & PLAN:  Multiple myeloma in relapse (HCC) #Multiple myeloma-most recently on maintenance Revlimid 15 mg 2  weeks on 2 week off.  JAN 2025-  M-protein- 1.3[April 2023- NEG]; kappa=113 lambda light chain ratio=2.5 [July 2020- 2.0]. RISING- ca- 10.4.  #  I reviewed with the patient that given the slight increase in  the kappa lambda light chain ratio/multiple myeloma will concerning for recurrent myeloma.  - however renal function/Hb.   # Discussed re: bone marrow Biopsy; And also discussed regarding the PET scan.  Patient is agreeable.  Understands that he might need alternative therapy if progression of disease is confirmed.:  Further Revlimid until further evaluation; # STOP Revlimid  [15 mg 2 weeks; 2 weeks OFF]- given the concerns of progressive disease.  # acute back pain: in last 2 weeks- right sided- NO radiation otherwise no neurologic deficit.-check bone survey ASAP.  Also await PET scan.  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 stable. April 2024- Vit D 38- recommend Vit D 50,K/weekly. Bone modifying agent/multiple myeloma-- on zometa 3 mg-  proceed with zometa today. On motrin- TID- HOLD MOTRIN; take tramadol 50 mg prn q 6 hours.    # DM- on OHA - FBG- 140s- -   # Zometa q 6 M-last 05/14/2023  Disposition: # Zometa today-  # bone survey- DRI- ASAP # bone marrow Biopsy ASAP- please order # PET scan ASAP-  # follow up in 2 weeks- [Mon thru Wed]- MD: labs- cbc/cmp-Dr.B      Orders Placed This Encounter  Procedures   NM PET Image Restage (PS) Whole Body (F-18 FDG)    Standing Status:   Future    Expiration Date:   05/13/2024    If indicated for the ordered procedure, I authorize the administration of a radiopharmaceutical per Radiology protocol:   Yes    Preferred imaging location?:   [facility]   IR BONE MARROW BIOPSY & ASPIRATION    Standing Status:   Future    Expected Date:   05/21/2023    Expiration Date:   05/13/2024    Reason for Exam (SYMPTOM  OR DIAGNOSIS REQUIRED):   Multiple myeloma    Preferred Imaging Location?:   [facility]   DG Bone Survey Met    INS: AET[employer] MCR EPIC ORDER WT: 214 NO NEEDS NO GLUCOSE MONITOR NO STIMULATORS NO INJECTORS PT AWARE OF $75 NO SHOW FEE PT AWARE TO BRING PHOTO ID & INS CARD [facility] W/ OFFICE/DT    Standing  Status:   Future    Expiration Date:   05/13/2024    Reason for Exam (SYMPTOM  OR DIAGNOSIS REQUIRED):   multiple myeloma    Preferred imaging location?:   [facility]   CBC with Differential (Cancer Center Only)    Standing Status:   Future    Expected Date:   05/28/2023    Expiration Date:   05/13/2024   CMP (Cancer Center only)    Standing Status:   Future    Expected Date:   05/28/2023    Expiration Date:   05/13/2024    All questions were answered. The patient knows to call the clinic with any problems, questions or concerns.      Earna Coder, MD 05/14/2023 2:17  PM

## 2023-05-14 NOTE — Patient Instructions (Signed)
 Zoledronic Acid Injection  What is this medication? ZOLEDRONIC ACID (ZOE le dron ik AS id) treats high calcium levels in the blood caused by cancer. It may also be used with chemotherapy to treat weakened bones caused by cancer. It works by slowing down the release of calcium from bones. This lowers calcium levels in your blood. It also makes your bones stronger and less likely to break (fracture). It belongs to a group of medications called bisphosphonates. This medicine may be used for other purposes; ask your health care provider or pharmacist if you have questions. COMMON BRAND NAME(S): Zometa, Zometa Powder What should I tell my care team before I take this medication? They need to know if you have any of these conditions: Dehydration Dental disease Kidney disease Liver disease Low levels of calcium in the blood Lung or breathing disease, such as asthma Receiving steroids, such as dexamethasone or prednisone An unusual or allergic reaction to zoledronic acid, other medications, foods, dyes, or preservatives Pregnant or trying to get pregnant Breast-feeding How should I use this medication? This medication is injected into a vein. It is given by your care team in a hospital or clinic setting. Talk to your care team about the use of this medication in children. Special care may be needed. Overdosage: If you think you have taken too much of this medicine contact a poison control center or emergency room at once. NOTE: This medicine is only for you. Do not share this medicine with others. What if I miss a dose? Keep appointments for follow-up doses. It is important not to miss your dose. Call your care team if you are unable to keep an appointment. What may interact with this medication? Certain antibiotics given by injection Diuretics, such as bumetanide, furosemide NSAIDs, medications for pain and inflammation, such as ibuprofen or naproxen Teriparatide Thalidomide This list may not  describe all possible interactions. Give your health care provider a list of all the medicines, herbs, non-prescription drugs, or dietary supplements you use. Also tell them if you smoke, drink alcohol, or use illegal drugs. Some items may interact with your medicine. What should I watch for while using this medication? Visit your care team for regular checks on your progress. It may be some time before you see the benefit from this medication. Some people who take this medication have severe bone, joint, or muscle pain. This medication may also increase your risk for jaw problems or a broken thigh bone. Tell your care team right away if you have severe pain in your jaw, bones, joints, or muscles. Tell you care team if you have any pain that does not go away or that gets worse. Tell your dentist and dental surgeon that you are taking this medication. You should not have major dental surgery while on this medication. See your dentist to have a dental exam and fix any dental problems before starting this medication. Take good care of your teeth while on this medication. Make sure you see your dentist for regular follow-up appointments. You should make sure you get enough calcium and vitamin D while you are taking this medication. Discuss the foods you eat and the vitamins you take with your care team. Check with your care team if you have severe diarrhea, nausea, and vomiting, or if you sweat a lot. The loss of too much body fluid may make it dangerous for you to take this medication. You may need bloodwork while taking this medication. Talk to your care team if  you wish to become pregnant or think you might be pregnant. This medication can cause serious birth defects. What side effects may I notice from receiving this medication? Side effects that you should report to your care team as soon as possible: Allergic reactions--skin rash, itching, hives, swelling of the face, lips, tongue, or throat Kidney  injury--decrease in the amount of urine, swelling of the ankles, hands, or feet Low calcium level--muscle pain or cramps, confusion, tingling, or numbness in the hands or feet Osteonecrosis of the jaw--pain, swelling, or redness in the mouth, numbness of the jaw, poor healing after dental work, unusual discharge from the mouth, visible bones in the mouth Severe bone, joint, or muscle pain Side effects that usually do not require medical attention (report to your care team if they continue or are bothersome): Constipation Fatigue Fever Loss of appetite Nausea Stomach pain This list may not describe all possible side effects. Call your doctor for medical advice about side effects. You may report side effects to FDA at 1-800-FDA-1088. Where should I keep my medication? This medication is given in a hospital or clinic. It will not be stored at home. NOTE: This sheet is a summary. It may not cover all possible information. If you have questions about this medicine, talk to your doctor, pharmacist, or health care provider.  2024 Elsevier/Gold Standard (2021-03-18 00:00:00)

## 2023-05-14 NOTE — Progress Notes (Signed)
 Was seen at [facility] yesterday for back pain. No xrays or scans. Pain this morning 9/10. Possible pulled muscle. Lidocaine patch and flexeril not helping. Using mobic and heating pad.

## 2023-05-14 NOTE — Patient Instructions (Signed)
 HOLD MOTRIN  # Take tramadol 50 mg prn q 6 hours.

## 2023-05-15 LAB — KAPPA/LAMBDA LIGHT CHAINS
Kappa free light chain: 83.8 mg/L — ABNORMAL HIGH (ref 3.3–19.4)
Kappa, lambda light chain ratio: 4.36 — ABNORMAL HIGH (ref 0.26–1.65)
Lambda free light chains: 19.2 mg/L (ref 5.7–26.3)

## 2023-05-16 LAB — MULTIPLE MYELOMA PANEL, SERUM
Albumin SerPl Elph-Mcnc: 3.6 g/dL (ref 2.9–4.4)
Albumin/Glob SerPl: 0.8 (ref 0.7–1.7)
Alpha 1: 0.2 g/dL (ref 0.0–0.4)
Alpha2 Glob SerPl Elph-Mcnc: 0.8 g/dL (ref 0.4–1.0)
B-Globulin SerPl Elph-Mcnc: 1.4 g/dL — ABNORMAL HIGH (ref 0.7–1.3)
Gamma Glob SerPl Elph-Mcnc: 2.2 g/dL — ABNORMAL HIGH (ref 0.4–1.8)
Globulin, Total: 4.6 g/dL — ABNORMAL HIGH (ref 2.2–3.9)
IgA: 413 mg/dL (ref 61–437)
IgG (Immunoglobin G), Serum: 2791 mg/dL — ABNORMAL HIGH (ref 603–1613)
IgM (Immunoglobulin M), Srm: 34 mg/dL (ref 15–143)
M Protein SerPl Elph-Mcnc: 1.6 g/dL — ABNORMAL HIGH
Total Protein ELP: 8.2 g/dL (ref 6.0–8.5)

## 2023-05-18 ENCOUNTER — Ambulatory Visit
Admission: RE | Admit: 2023-05-18 | Discharge: 2023-05-18 | Disposition: A | Discharge status: Home / Self Care | Source: Ambulatory Visit | Attending: Internal Medicine | Admitting: Internal Medicine

## 2023-05-18 DIAGNOSIS — K8020 Calculus of gallbladder without cholecystitis without obstruction: Secondary | ICD-10-CM | POA: Diagnosis not present

## 2023-05-18 DIAGNOSIS — X58XXXA Exposure to other specified factors, initial encounter: Secondary | ICD-10-CM | POA: Insufficient documentation

## 2023-05-18 DIAGNOSIS — I700 Atherosclerosis of aorta: Secondary | ICD-10-CM | POA: Diagnosis not present

## 2023-05-18 DIAGNOSIS — C9002 Multiple myeloma in relapse: Secondary | ICD-10-CM | POA: Diagnosis present

## 2023-05-18 DIAGNOSIS — S42022A Displaced fracture of shaft of left clavicle, initial encounter for closed fracture: Secondary | ICD-10-CM | POA: Insufficient documentation

## 2023-05-18 LAB — GLUCOSE, CAPILLARY: Glucose-Capillary: 121 mg/dL — ABNORMAL HIGH (ref 70–99)

## 2023-05-18 MED ORDER — FLUDEOXYGLUCOSE F - 18 (FDG) INJECTION
11.8600 | Freq: Once | INTRAVENOUS | Status: AC | PRN
  Administered 2023-05-18: 11.86 via INTRAVENOUS

## 2023-05-21 ENCOUNTER — Telehealth: Payer: Self-pay | Admitting: *Deleted

## 2023-05-21 NOTE — Telephone Encounter (Signed)
 Pt has a Bone Marrow Biopsy scheduled for 05/31/23. [facility] 7:30 [facility]. Nothing to eat or drink after midnight. He will need a driver. Pt aware and agreeable.

## 2023-05-22 ENCOUNTER — Telehealth: Payer: Self-pay | Admitting: *Deleted

## 2023-05-22 NOTE — Telephone Encounter (Signed)
 Confirmed change in date for Bone marrow Bx to Fri 4/25 arrive at 7:30 am to [facility]. Wife confirmed appt.

## 2023-05-28 ENCOUNTER — Encounter: Payer: Self-pay | Admitting: Internal Medicine

## 2023-05-28 ENCOUNTER — Inpatient Hospital Stay

## 2023-05-28 ENCOUNTER — Inpatient Hospital Stay (HOSPITAL_BASED_OUTPATIENT_CLINIC_OR_DEPARTMENT_OTHER): Admitting: Internal Medicine

## 2023-05-28 VITALS — BP 136/73 | HR 75 | Temp 97.6°F | Resp 18 | Ht 70.0 in | Wt 209.4 lb

## 2023-05-28 DIAGNOSIS — C9002 Multiple myeloma in relapse: Secondary | ICD-10-CM | POA: Diagnosis not present

## 2023-05-28 DIAGNOSIS — Z5111 Encounter for antineoplastic chemotherapy: Secondary | ICD-10-CM | POA: Diagnosis not present

## 2023-05-28 LAB — CMP (CANCER CENTER ONLY)
ALT: 15 U/L (ref 0–44)
AST: 19 U/L (ref 15–41)
Albumin: 3.6 g/dL (ref 3.5–5.0)
Alkaline Phosphatase: 76 U/L (ref 38–126)
Anion gap: 3 — ABNORMAL LOW (ref 5–15)
BUN: 16 mg/dL (ref 8–23)
CO2: 26 mmol/L (ref 22–32)
Calcium: 9.3 mg/dL (ref 8.9–10.3)
Chloride: 108 mmol/L (ref 98–111)
Creatinine: 1.56 mg/dL — ABNORMAL HIGH (ref 0.61–1.24)
GFR, Estimated: 45 mL/min — ABNORMAL LOW (ref >60)
Glucose, Bld: 119 mg/dL — ABNORMAL HIGH (ref 70–99)
Potassium: 5.4 mmol/L — ABNORMAL HIGH (ref 3.5–5.1)
Sodium: 137 mmol/L (ref 135–145)
Total Bilirubin: 0.8 mg/dL (ref 0.0–1.2)
Total Protein: 8.2 g/dL — ABNORMAL HIGH (ref 6.5–8.1)

## 2023-05-28 LAB — CBC WITH DIFFERENTIAL (CANCER CENTER ONLY)
Abs Immature Granulocytes: 0.01 10*3/uL (ref 0.00–0.07)
Basophils Absolute: 0 10*3/uL (ref 0.0–0.1)
Basophils Relative: 0 %
Eosinophils Absolute: 0.1 10*3/uL (ref 0.0–0.5)
Eosinophils Relative: 1 %
HCT: 44.1 % (ref 39.0–52.0)
Hemoglobin: 14.3 g/dL (ref 13.0–17.0)
Immature Granulocytes: 0 %
Lymphocytes Relative: 22 %
Lymphs Abs: 0.9 10*3/uL (ref 0.7–4.0)
MCH: 30.8 pg (ref 26.0–34.0)
MCHC: 32.4 g/dL (ref 30.0–36.0)
MCV: 94.8 fL (ref 80.0–100.0)
Monocytes Absolute: 0.7 10*3/uL (ref 0.1–1.0)
Monocytes Relative: 15 %
Neutro Abs: 2.6 10*3/uL (ref 1.7–7.7)
Neutrophils Relative %: 62 %
Platelet Count: 248 10*3/uL (ref 150–400)
RBC: 4.65 MIL/uL (ref 4.22–5.81)
RDW: 15.6 % — ABNORMAL HIGH (ref 11.5–15.5)
WBC Count: 4.3 10*3/uL (ref 4.0–10.5)
nRBC: 0 % (ref 0.0–0.2)

## 2023-05-28 MED ORDER — ACYCLOVIR 400 MG PO TABS: 400.0000 mg | ORAL_TABLET | Freq: Two times a day (BID) | ORAL | 2 refills | Status: DC

## 2023-05-28 MED ORDER — ONDANSETRON HCL 8 MG PO TABS: ORAL_TABLET | ORAL | 1 refills | Status: DC

## 2023-05-28 MED ORDER — PROCHLORPERAZINE MALEATE 10 MG PO TABS: 10.0000 mg | ORAL_TABLET | Freq: Four times a day (QID) | ORAL | 1 refills | Status: DC | PRN

## 2023-05-28 NOTE — Progress Notes (Signed)
 START ON PATHWAY REGIMEN - Multiple Myeloma and Other Plasma Cell Dyscrasias     Cycles 1 through 3: A cycle is every 21 days:     Dexamethasone      Bortezomib      Daratumumab and hyaluronidase-fihj    Cycles 4 through 8: A cycle is every 21 days:     Dexamethasone      Bortezomib      Daratumumab and hyaluronidase-fihj    Cycles 9 and beyond: A cycle is every 28 days:     Daratumumab and hyaluronidase-fihj   **Always confirm dose/schedule in your pharmacy ordering system**  Patient Characteristics: Multiple Myeloma, Relapsed / Refractory, Second through Fourth Lines of Therapy, Not a Candidate for CAR T-cell Therapy, Fit or Candidate for Triplet Therapy, Lenalidomide -Refractory or Lenalidomide -based Regimen Not Preferred, Candidate for Anti-CD38  Antibody Disease Classification: Multiple Myeloma Therapeutic Status: Relapsed R2-ISS Staging: II Line of Therapy: Second Line Anti-CD38 Antibody Candidacy: Candidate for Anti-CD38 Antibody Lenalidomide -based Regimen Preference/Candidacy: Lenalidomide -Refractory Intent of Therapy: Non-Curative / Palliative Intent, Discussed with Patient

## 2023-05-28 NOTE — Assessment & Plan Note (Addendum)
#  Multiple myeloma-most recently on maintenance Revlimid  15 mg 2  weeks on 2 week off.  JAN 2025-  M-protein- 1.3[April 2023- NEG]; kappa=113 lambda light chain ratio=2.5 [July 2020- 2.0]. RISING- ca- 10.4. # APRIL 2025- . Diffuse lytic myelomatous lesions throughout the skeleton. Most of these lesions demonstrate hypermetabolism; Healing left mid clavicle fracture, likely pathologic; Early nondisplaced pathological fracture involving the right mid clavicle; Left pubic bone lesion has a defect in the outer cortex and this could be a pending pathologic fracture; . No findings for extraskeletal myeloma. Bone marrow Bx- on APRIL 25th, 2025- Pending.   #  I reviewed with the patient that given the slight increase in the kappa lambda light chain ratio/multiple myeloma will concerning for recurrent myeloma.  - however renal function/Hb.   # Hyperkalemia- 5.4 -on K Supplementation-stop taking potassium for 1 week; and then start taking 1 pill a day  #     # acute back pain: in last 2 weeks- right sided- NO radiation otherwise no neurologic deficit.-check bone survey ASAP.  Also await PET scan.  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 stable. April 2024- Vit D 38- recommend Vit D 50,K/weekly. Bone modifying agent/multiple myeloma-- on zometa  3 mg-  s/p zometa - On motrin- TID- HOLD MOTRIN; take tramadol  50 mg prn q 6 hours.    # DM- on OHA - FBG- 140s- -   #Incidental findings on Imaging  PET- CT , 2025: 6. Cholelithiasis. 7. Aortic atherosclerosis.I reviewed/discussed/counseled the patient.     # Zometa  q 6 M-last 05/14/2023  Disposition:  # follow up in 2 weeks- [Mon thru Wed]- MD: labs- cbc/cmp; possible Zometa -Dr.B

## 2023-05-28 NOTE — Progress Notes (Signed)
 Having his bone marrow biopsy 06/01/23.   PET 05/18/23. Needs results.

## 2023-05-28 NOTE — Progress Notes (Unsigned)
 [facility] OFFICE PROGRESS NOTE  Patient Care Team: [name], MD as PCP - General (Internal Medicine) Eduardo Grade, MD as Consulting Physician (Dermatology) [name], MD as Consulting Physician (Hematology and Oncology)   Cancer Staging  No matching staging information was found for the patient.    Oncology History Overview Note   # April 2014- MULTIPLE MYELOMA [IgG- 2.2gm/dl; 82-95% plasma cell; Cyto-N; FISH- gain of chr.9, 15 & ccnd1/11q13] s/p RVD x6 [sep 2014; BMBx- ~5% plasma cells]; Maint Rev-DEX-Zometa ; March 2015-Stopped Dex Cont- Rev-Zometa ; July 2016- Rev 25mg ; NOV 4th  2016-HOLD; BMT eval at [facility] [Dr.Woods]; declined BMT.   # Re-START FEB 2017 Rev 15mg  3 W- on & 1 w OFF; HELD Rev June- Oct 2020- sec to worsening renal function [Dr.Singh]; SEP 2021- Rev 15 mg 2 w-On & 2 w-OFF.   # Zometa   # CKD [creat ~1.4; sec MM; Dr.Singh]; DM  -------------------------------------------------------------------------   DIAGNOSIS: [name] ] MULTIPLE MYELOMA  GOALS: control  CURRENT/MOST RECENT THERAPY- REVLIMID  Maintenance [Feb 2017]    Multiple myeloma in remission (HCC)  10/01/2015 Initial Diagnosis   Multiple myeloma in remission (HCC)    INTERVAL HISTORY: pt is  with wife.  Ambulating independently.   Alan Alexander 78 y.o.  male pleasant patient above history of multiple myeloma  on Revlimid  maintenance -given concern for recurrence is here for follow-up/ and review the results of the PET scan/and blood work.     In the interm patient was seen at [facility] yesterday for back pain. No xrays or scans. Pain this morning 9/10. Possible pulled muscle. Lidocaine  patch and flexeril  not helping. Using mobic and heating pad.    Appetite is good. Energy is good. Fx collar bone 6 weeks ago. Denies pain. Sleeps good. Denies weakness. Denies any difficulties or concerns today.   Patient denies any skin rash.  Denies any diarrhea.  No swelling in the legs.   No nausea vomiting.  Admits to compliance with Revlimid .  Review of Systems  Constitutional:  Negative for chills, diaphoresis, fever, malaise/fatigue and weight loss.  HENT:  Negative for nosebleeds and sore throat.   Eyes:  Negative for double vision.  Respiratory:  Negative for cough, hemoptysis, sputum production, shortness of breath and wheezing.   Cardiovascular:  Positive for leg swelling. Negative for chest pain, palpitations and orthopnea.  Gastrointestinal:  Negative for abdominal pain, blood in stool, constipation, diarrhea, heartburn, melena, nausea and vomiting.  Genitourinary:  Negative for dysuria, frequency and urgency.  Musculoskeletal:  Negative for back pain and joint pain.  Skin: Negative.  Negative for itching and rash.  Neurological:  Negative for dizziness, tingling, focal weakness, weakness and headaches.  Endo/Heme/Allergies:  Does not bruise/bleed easily.  Psychiatric/Behavioral:  Negative for depression. The patient is not nervous/anxious and does not have insomnia.       PAST MEDICAL HISTORY :  Past Medical History:  Diagnosis Date   Diabetes mellitus without complication (HCC)    GERD (gastroesophageal reflux disease)    Hyperlipidemia    Hypertension    Multiple myeloma (HCC)    Obesity    Thyroid cancer (HCC)    Thyroid disease     PAST SURGICAL HISTORY :   Past Surgical History:  Procedure Laterality Date   BONE MARROW BIOPSY  2014   CATARACT EXTRACTION BILATERAL W/ ANTERIOR VITRECTOMY      FAMILY HISTORY :   Family History  Problem Relation Age of Onset   Cancer Father 36  Diabetes Mother     SOCIAL HISTORY:   Social History   Tobacco Use   Smoking status: Never   Smokeless tobacco: Never  Substance Use Topics   Alcohol use: No   Drug use: No    ALLERGIES:  has no known allergies.  MEDICATIONS:  Current Outpatient Medications  Medication Sig Dispense Refill   amLODipine  (NORVASC ) 10 MG tablet Take 10 mg by mouth daily.      aspirin EC 81 MG tablet Take 81 mg by mouth daily.     atorvastatin  (LIPITOR) 20 MG tablet Take 20 mg by mouth daily.     cloNIDine  (CATAPRES ) 0.1 MG tablet Take 0.1 mg by mouth 2 (two) times daily.     Cyanocobalamin 1000 MCG TBCR Take by mouth.     cyclobenzaprine  (FLEXERIL ) 5 MG tablet Take 1 tablet (5 mg total) by mouth 3 (three) times daily as needed. 15 tablet 0   dapagliflozin propanediol (FARXIGA) 10 MG TABS tablet TAKE 10 MG BY MOUTH 1 (ONE) TIME EACH DAY IN THE MORNING     ergocalciferol  (VITAMIN D2) 1.25 MG (50000 UT) capsule Take 1 capsule (50,000 Units total) by mouth once a week. 12 capsule 1   famotidine  (PEPCID ) 20 MG tablet Take 1 tablet (20 mg total) by mouth 2 (two) times daily. (Patient not taking: Reported on 05/14/2023) 14 tablet 0   glipiZIDE (GLUCOTROL) 5 MG tablet Take 5 mg by mouth every morning.     lenalidomide  (REVLIMID ) 15 MG capsule TAKE 1 CAPSULE BY MOUTH 1 TIME A DAY FOR 14 DAYS ON THEN 14 DAYS OFF 14 capsule 0   levothyroxine  (SYNTHROID ) 137 MCG tablet Take 1 tablet by mouth daily.      lisinopril (ZESTRIL) 20 MG tablet Take 20 mg by mouth daily.     naproxen  (NAPROSYN ) 500 MG tablet Take 1 tablet (500 mg total) by mouth 2 (two) times daily with a meal. 20 tablet 2   ONETOUCH DELICA LANCETS 33G MISC Inject 1 Lancet as directed once daily.     pioglitazone  (ACTOS ) 30 MG tablet Take 30 mg by mouth daily.     potassium chloride  SA (K-DUR,KLOR-CON ) 20 MEQ tablet Take 20 mEq by mouth 2 (two) times daily.   0   quinapril  (ACCUPRIL ) 40 MG tablet Take 40 mg by mouth daily.     torsemide  (DEMADEX ) 20 MG tablet Take 20 mg by mouth daily as needed.     traMADol  (ULTRAM ) 50 MG tablet Take 1 tablet (50 mg total) by mouth every 6 (six) hours as needed for moderate pain (pain score 4-6). 45 tablet 0   No current facility-administered medications for this visit.    PHYSICAL EXAMINATION: ECOG PERFORMANCE STATUS: 0 - Asymptomatic  There were no vitals taken for this  visit.  There were no vitals filed for this visit.        Physical Exam Vitals and nursing note reviewed.  HENT:     Head: Normocephalic and atraumatic.     Mouth/Throat:     Pharynx: Oropharynx is clear. No oropharyngeal exudate.  Eyes:     Extraocular Movements: Extraocular movements intact.     Pupils: Pupils are equal, round, and reactive to light.  Cardiovascular:     Rate and Rhythm: Normal rate and regular rhythm.  Pulmonary:     Effort: No respiratory distress.     Breath sounds: No wheezing.     Comments: Decreased breath sounds bilaterally.  Abdominal:     General: Bowel  sounds are normal. There is no distension.     Palpations: Abdomen is soft. There is no mass.     Tenderness: There is no abdominal tenderness. There is no guarding or rebound.  Musculoskeletal:        General: No tenderness. Normal range of motion.     Cervical back: Normal range of motion and neck supple.  Skin:    General: Skin is warm.  Neurological:     General: No focal deficit present.     Mental Status: He is alert and oriented to person, place, and time.  Psychiatric:        Mood and Affect: Affect normal.        Behavior: Behavior normal.        Judgment: Judgment normal.       LABORATORY DATA:  I have reviewed the data as listed    Component Value Date/Time   NA 131 (L) 05/14/2023 0812   NA 139 05/01/2014 1322   K 4.1 05/14/2023 0812   K 4.4 05/01/2014 1322   CL 101 05/14/2023 0812   CL 107 05/01/2014 1322   CO2 22 05/14/2023 0812   CO2 27 05/01/2014 1322   GLUCOSE 148 (H) 05/14/2023 0812   GLUCOSE 102 (H) 05/01/2014 1322   BUN 23 05/14/2023 0812   BUN 16 05/01/2014 1322   CREATININE 1.43 (H) 05/14/2023 0812   CREATININE 1.48 (H) 05/29/2014 0949   CALCIUM 9.2 05/14/2023 0812   CALCIUM 9.1 05/01/2014 1322   PROT 8.9 (H) 05/14/2023 0812   PROT 7.5 05/01/2014 1322   ALBUMIN 4.0 05/14/2023 0812   ALBUMIN 3.6 05/01/2014 1322   AST 21 05/14/2023 0812   ALT 22  05/14/2023 0812   ALT 13 (L) 05/01/2014 1322   ALKPHOS 84 05/14/2023 0812   ALKPHOS 47 05/01/2014 1322   BILITOT 1.1 05/14/2023 0812   GFRNONAA 50 (L) 05/14/2023 0812   GFRNONAA 47 (L) 05/29/2014 0949   GFRAA 47 (L) 10/21/2019 1106   GFRAA 55 (L) 05/29/2014 0949    No results found for: "SPEP", "UPEP"  Lab Results  Component Value Date   WBC 6.0 05/14/2023   NEUTROABS 5.0 05/14/2023   HGB 14.7 05/14/2023   HCT 46.1 05/14/2023   MCV 94.9 05/14/2023   PLT 270 05/14/2023      Chemistry      Component Value Date/Time   NA 131 (L) 05/14/2023 0812   NA 139 05/01/2014 1322   K 4.1 05/14/2023 0812   K 4.4 05/01/2014 1322   CL 101 05/14/2023 0812   CL 107 05/01/2014 1322   CO2 22 05/14/2023 0812   CO2 27 05/01/2014 1322   BUN 23 05/14/2023 0812   BUN 16 05/01/2014 1322   CREATININE 1.43 (H) 05/14/2023 0812   CREATININE 1.48 (H) 05/29/2014 0949      Component Value Date/Time   CALCIUM 9.2 05/14/2023 0812   CALCIUM 9.1 05/01/2014 1322   ALKPHOS 84 05/14/2023 0812   ALKPHOS 47 05/01/2014 1322   AST 21 05/14/2023 0812   ALT 22 05/14/2023 0812   ALT 13 (L) 05/01/2014 1322   BILITOT 1.1 05/14/2023 0812       RADIOGRAPHIC STUDIES: I have personally reviewed the radiological images as listed and agreed with the findings in the report. No results found.   ASSESSMENT & PLAN:  Multiple myeloma in relapse (HCC) #Multiple myeloma-most recently on maintenance Revlimid  15 mg 2  weeks on 2 week off.  JAN 2025-  M-protein- 1.3[April  2023- NEG]; kappa=113 lambda light chain ratio=2.5 [July 2020- 2.0]. RISING- ca- 10.4.  #  I reviewed with the patient that given the slight increase in the kappa lambda light chain ratio/multiple myeloma will concerning for recurrent myeloma.  - however renal function/Hb.   # Discussed re: bone marrow Biopsy; And also discussed regarding the PET scan.  Patient is agreeable.  Understands that he might need alternative therapy if progression of disease  is confirmed.:  Further Revlimid  until further evaluation; # STOP Revlimid   [15 mg 2 weeks; 2 weeks OFF]- given the concerns of progressive disease.  # APRIL 2025- . Diffuse lytic myelomatous lesions throughout the skeleton. Most of these lesions demonstrate hypermetabolism; Healing left mid clavicle fracture, likely pathologic; Early nondisplaced pathological fracture involving the right mid clavicle; Left pubic bone lesion has a defect in the outer cortex and this could be a pending pathologic fracture; . No findings for extraskeletal myeloma.     # acute back pain: in last 2 weeks- right sided- NO radiation otherwise no neurologic deficit.-check bone survey ASAP.  Also await PET scan.  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 stable. April 2024- Vit D 38- recommend Vit D 50,K/weekly. Bone modifying agent/multiple myeloma-- on zometa  3 mg-  proceed with zometa  today. On motrin- TID- HOLD MOTRIN; take tramadol  50 mg prn q 6 hours.    # DM- on OHA - FBG- 140s- -   #Incidental findings on Imaging  PET- CT , 2025: 6. Cholelithiasis. 7. Aortic atherosclerosis.I reviewed/discussed/counseled the patient.     # Zometa  q 6 M-last 05/14/2023  Disposition: # Zometa  today-  # bone survey- DRI- ASAP # bone marrow Biopsy ASAP- please order # PET scan ASAP-  # follow up in 2 weeks- [Mon thru Wed]- MD: labs- cbc/cmp-Dr.B      No orders of the defined types were placed in this encounter.   All questions were answered. The patient knows to call the clinic with any problems, questions or concerns.      [name], MD 05/28/2023 1:48 PM

## 2023-05-28 NOTE — Patient Instructions (Signed)
#   stop taking potassium for 1 week; and then start taking 1 pill a day

## 2023-05-29 ENCOUNTER — Encounter: Payer: Self-pay | Admitting: Internal Medicine

## 2023-05-29 ENCOUNTER — Other Ambulatory Visit: Payer: Self-pay

## 2023-05-29 NOTE — Progress Notes (Signed)
 Pharmacist Chemotherapy Monitoring - Initial Assessment    Anticipated start date: 06/05/23   The following has been reviewed per standard work regarding the patient's treatment regimen: The patient's diagnosis, treatment plan and drug doses, and organ/hematologic function Lab orders and baseline tests specific to treatment regimen  The treatment plan start date, drug sequencing, and pre-medications Prior authorization status  Patient's documented medication list, including drug-drug interaction screen and prescriptions for anti-emetics and supportive care specific to the treatment regimen The drug concentrations, fluid compatibility, administration routes, and timing of the medications to be used The patient's access for treatment and lifetime cumulative dose history, if applicable  The patient's medication allergies and previous infusion related reactions, if applicable   Changes made to treatment plan:  N/A  Follow up needed:  Pending authorization for treatment  and ensure Type and Screen/RBC prior to [employer], RPH, 05/29/2023  3:24 PM

## 2023-05-30 ENCOUNTER — Other Ambulatory Visit: Payer: Self-pay

## 2023-05-30 ENCOUNTER — Encounter: Payer: Self-pay | Admitting: Internal Medicine

## 2023-05-31 ENCOUNTER — Other Ambulatory Visit: Payer: Self-pay

## 2023-05-31 ENCOUNTER — Ambulatory Visit: Admitting: Radiology

## 2023-05-31 ENCOUNTER — Other Ambulatory Visit: Payer: Self-pay | Admitting: *Deleted

## 2023-05-31 ENCOUNTER — Telehealth: Payer: Self-pay | Admitting: *Deleted

## 2023-05-31 DIAGNOSIS — Z01818 Encounter for other preprocedural examination: Secondary | ICD-10-CM

## 2023-05-31 MED ORDER — TRAMADOL HCL 50 MG PO TABS
50.0000 mg | ORAL_TABLET | Freq: Four times a day (QID) | ORAL | 0 refills | Status: DC | PRN
Start: 2023-05-31 — End: 2023-07-03

## 2023-05-31 NOTE — Progress Notes (Signed)
 Patient for IR Bone Marrow Biopsy on Friday 06/01/23 (date), I called and spoke with the patient's wife, Aurora Blowers on the phone and gave pre-procedure instructions. Aurora Blowers was made aware to have the patient here at 7:30a, NPO after MN prior to procedure as well as driver post procedure/recovery/discharge. Aurora Blowers stated understanding.  Called 05/31/23

## 2023-05-31 NOTE — H&P (Signed)
 Chief Complaint: Multiple myeloma; request for image guided bone marrow biopsy and aspiration  Referring Provider(s): [name]   Supervising Physician: Myrlene Asper  Patient Status: [facility] - Out-pt  History of Present Illness: Alan Alexander is a 78 y.o. male with PMH significant for DM, GERD, HLD, HTN, thyroid cancer, multiple myeloma. Multiple myeloma diagnosed in 2014 with bone marrow biopsy, was noted as in remission 2017. He has continued maintenance therapies. In 2023, surveillance identified concerning kappa lambda light chain ratio leading to PET and request for bone marrow biopsy and asp to evaluate for disease progression and guide therapy. On 05/18/23 PET, most notable osseous findings pertinent to today's planned procedure: Diffuse lytic myelomatous lesions throughout the skeleton. Most of these lesions demonstrate hypermetabolism.Left pubic bone lesion has a defect in the outer cortex and this could be a pending pathologic fracture. He presents today for image guided bone marrow biopsy under moderate sedation. He has been NPO since MN, has a ride/supervision 24hr. He was not asked to hold ASA for this low bleeding risk procedure.  He tolerates RA at baseline, does not wear a CPAP at night. He denies any complaint today. See full ROS below. Wife is at bedside during interview.   Patient is Full Code  Past Medical History:  Diagnosis Date   Diabetes mellitus without complication (HCC)    GERD (gastroesophageal reflux disease)    Hyperlipidemia    Hypertension    Multiple myeloma (HCC)    Obesity    Thyroid cancer (HCC)    Thyroid disease     Past Surgical History:  Procedure Laterality Date   BONE MARROW BIOPSY  2014   CATARACT EXTRACTION BILATERAL W/ ANTERIOR VITRECTOMY      Allergies: Patient has no known allergies.  Medications: Prior to Admission medications   Medication Sig Start Date End Date Taking? Authorizing Provider  acyclovir  (ZOVIRAX )  400 MG tablet Take 1 tablet (400 mg total) by mouth 2 (two) times daily. 05/28/23   Brahmanday, Govinda R, MD  amLODipine  (NORVASC ) 10 MG tablet Take 10 mg by mouth daily. 09/14/13   [provider]  aspirin EC 81 MG tablet Take 81 mg by mouth daily.    [provider]  atorvastatin  (LIPITOR) 20 MG tablet Take 20 mg by mouth daily. 09/23/13   [provider]  cloNIDine  (CATAPRES ) 0.1 MG tablet Take 0.1 mg by mouth 2 (two) times daily. 08/28/13   [provider]  Cyanocobalamin 1000 MCG TBCR Take by mouth.    [provider]  cyclobenzaprine  (FLEXERIL ) 5 MG tablet Take 1 tablet (5 mg total) by mouth 3 (three) times daily as needed. 05/13/23   Menshew, Raye Cai, PA-C  dapagliflozin propanediol (FARXIGA) 10 MG TABS tablet TAKE 10 MG BY MOUTH 1 (ONE) TIME EACH DAY IN THE MORNING 03/07/21   [provider]  ergocalciferol  (VITAMIN D2) 1.25 MG (50000 UT) capsule Take 1 capsule (50,000 Units total) by mouth once a week. 09/11/22   Brahmanday, Govinda R, MD  glipiZIDE (GLUCOTROL) 5 MG tablet Take 5 mg by mouth every morning. 04/25/22   [provider]  levothyroxine  (SYNTHROID ) 137 MCG tablet Take 1 tablet by mouth daily.  10/06/13   [provider]  lisinopril (ZESTRIL) 20 MG tablet Take 20 mg by mouth daily.    [provider]  naproxen  (NAPROSYN ) 500 MG tablet Take 1 tablet (500 mg total) by mouth 2 (two) times daily with a meal. 02/12/23   Bryson Carbine,  MD  ondansetron  (ZOFRAN ) 8 MG tablet One pill every 8 hours as needed for nausea/vomitting. 05/28/23   Brahmanday, Govinda R, MD  [facility] DELICA LANCETS 33G MISC Inject 1 Lancet as directed once daily. 09/20/14   [provider]  pioglitazone  (ACTOS ) 30 MG tablet Take 30 mg by mouth daily. 09/14/13   [provider]  potassium chloride  SA (K-DUR,KLOR-CON ) 20 MEQ tablet Take 20 mEq by mouth 2 (two) times daily.  12/23/14   [provider]  prochlorperazine   (COMPAZINE ) 10 MG tablet Take 1 tablet (10 mg total) by mouth every 6 (six) hours as needed for nausea or vomiting. 05/28/23   Brahmanday, Govinda R, MD  quinapril  (ACCUPRIL ) 40 MG tablet Take 40 mg by mouth daily. 09/14/13   [provider]  torsemide  (DEMADEX ) 20 MG tablet Take 20 mg by mouth daily as needed. 11/04/19   [provider]  traMADol  (ULTRAM ) 50 MG tablet Take 1 tablet (50 mg total) by mouth every 6 (six) hours as needed for moderate pain (pain score 4-6). 05/14/23   [name], MD     Family History  Problem Relation Age of Onset   Cancer Father 65   Diabetes Mother     Social History   Socioeconomic History   Marital status: Married    Spouse name: Not on file   Number of children: Not on file   Years of education: Not on file   Highest education level: Not on file  Occupational History   Not on file  Tobacco Use   Smoking status: Never   Smokeless tobacco: Never  Substance and Sexual Activity   Alcohol use: No   Drug use: No   Sexual activity: Not on file  Other Topics Concern   Not on file  Social History Narrative   Not on file   Social Drivers of Health   Financial Resource Strain: Not on file  Food Insecurity: Not on file  Transportation Needs: Not on file  Physical Activity: Not on file  Stress: Not on file  Social Connections: Not on file     Review of Systems: A 12 point ROS discussed and pertinent positives are indicated in the HPI above.  All other systems are negative.  Review of Systems  Constitutional:  Negative for chills, fatigue and fever.  HENT:  Negative for sore throat.   Respiratory:  Negative for shortness of breath.   Cardiovascular:  Negative for chest pain.  Gastrointestinal:  Negative for abdominal distention, abdominal pain, blood in stool, nausea and vomiting.  Genitourinary:  Negative for hematuria.  Skin:  Negative for wound.  Hematological:  Does not bruise/bleed easily.    Vital Signs: BP  (!) 172/90   Pulse 78   Temp 98.5 F (36.9 C) (Oral)   Resp (!) 22   Ht 5\' 10"  (1.778 m)   Wt 208 lb (94.3 kg)   SpO2 98%   BMI 29.84 kg/m   Advance Care Plan: No documents on file    Physical Exam HENT:     Mouth/Throat:     Mouth: Mucous membranes are moist.     Pharynx: Oropharynx is clear.  Cardiovascular:     Rate and Rhythm: Normal rate and regular rhythm.     Pulses: Normal pulses.     Heart sounds: Normal heart sounds.  Pulmonary:     Effort: Pulmonary effort is normal.     Breath sounds: Normal breath sounds.  Abdominal:     Palpations:  Abdomen is soft.     Tenderness: There is no abdominal tenderness.  Musculoskeletal:     Right lower leg: No edema.     Left lower leg: No edema.  Skin:    General: Skin is warm and dry.     Comments: No rash or wound noted to planned puncture site  Neurological:     Mental Status: He is alert and oriented to person, place, and time.  Psychiatric:        Mood and Affect: Mood normal.        Behavior: Behavior normal.        Thought Content: Thought content normal.        Judgment: Judgment normal.     Imaging: NM PET Image Restage (PS) Whole Body (F-18 FDG) Result Date: 05/18/2023 CLINICAL DATA:  Initial treatment strategy for multiple myeloma. EXAM: NUCLEAR MEDICINE PET WHOLE BODY TECHNIQUE: 11.86 mCi F-18 FDG was injected intravenously. Full-ring PET imaging was performed from the head to foot after the radiotracer. CT data was obtained and used for attenuation correction and anatomic localization. Fasting blood glucose: 121 mg/dl COMPARISON:  None Available. FINDINGS: Mediastinal blood pool activity: SUV max 2.75 HEAD/NECK: No hypermetabolic activity in the scalp. No hypermetabolic cervical lymph nodes. Incidental CT findings: none CHEST: No hypermetabolic mediastinal or hilar nodes. No suspicious pulmonary nodules on the CT scan. Incidental CT findings: Age related atherosclerotic calcifications involving the aorta and  coronary arteries but no aneurysm. No worrisome pulmonary lesions or acute pulmonary process. Small hiatal hernia noted. ABDOMEN/PELVIS: No abnormal hypermetabolic activity within the liver, pancreas, adrenal glands, or spleen. No hypermetabolic lymph nodes in the abdomen or pelvis. Incidental CT findings: Cholelithiasis. Numerous large renal cysts unchanged since 2021. No further imaging evaluation follow-up is necessary. Stable atherosclerotic calcifications involving the aorta and branch vessels but no aneurysm. SKELETON: Diffuse lytic myelomatous lesions noted throughout the skeleton. Most of these lesions demonstrate hypermetabolism. Index lesions: Left C7 transverse process with SUV max of 10.70 Right clavicular head with SUV max 17.70. T4 vertebral body with SUV max 8.07 Lower right sternal lesion with SUV max of 11.30. T11 vertebral body with SUV max of 14.63. Left pubic bone lesion with SUV max 19.27. Right anterior eighth rib lesion with SUV max of 12.12. Incidental CT findings: There is a healing left mid clavicle fracture, likely pathologic. Is also a small lesion involving the right mid clavicle with an early nondisplaced pathological fracture. The left pubic bone lesion has a defect in the outer cortex and this could be a pending pathologic fracture. EXTREMITIES: No definite lytic myelomatous lesions involving the upper or lower extremities and no areas hypermetabolism in the long bones. Incidental CT findings: none IMPRESSION: 1. Diffuse lytic myelomatous lesions throughout the skeleton. Most of these lesions demonstrate hypermetabolism. 2. Healing left mid clavicle fracture, likely pathologic. 3. Early nondisplaced pathological fracture involving the right mid clavicle. 4. Left pubic bone lesion has a defect in the outer cortex and this could be a pending pathologic fracture. 5. No findings for extraskeletal myeloma. 6. Cholelithiasis. 7. Aortic atherosclerosis. Electronically Signed   By: Marrian Siva M.D.   On: 05/18/2023 11:43    Labs:  CBC: Recent Labs    03/02/23 0809 04/13/23 0802 05/14/23 0812 05/28/23 1439  WBC 3.5* 4.7 6.0 4.3  HGB 14.9 13.5 14.7 14.3  HCT 45.4 41.4 46.1 44.1  PLT 299 286 270 248    COAGS: No results for input(s): "INR", "APTT" in the last 8760  hours.  BMP: Recent Labs    03/02/23 0809 04/13/23 0802 05/14/23 0812 05/28/23 1438  NA 136 138 131* 137  K 3.9 4.4 4.1 5.4*  CL 103 103 101 108  CO2 26 27 22 26   GLUCOSE 165* 159* 148* 119*  BUN 19 16 23 16   CALCIUM 9.5 10.4* 9.2 9.3  CREATININE 1.52* 1.45* 1.43* 1.56*  GFRNONAA 46* 49* 50* 45*    LIVER FUNCTION TESTS: Recent Labs    03/02/23 0809 04/13/23 0802 05/14/23 0812 05/28/23 1438  BILITOT 1.0 0.8 1.1 0.8  AST 22 14* 21 19  ALT 24 15 22 15   ALKPHOS 77 75 84 76  PROT 8.7* 8.8* 8.9* 8.2*  ALBUMIN 4.0 3.7 4.0 3.6    TUMOR MARKERS: No results for input(s): "AFPTM", "CEA", "CA199", "CHROMGRNA" in the last 8760 hours.  Assessment and Plan:  Request for image guided bone marrow biopsy and aspiration approved and scheduled 06/01/23. No contraindications for procedure identified in ROS, PE, 4/21 CBC (pending repeat w/diff today), or pre-sedation checklist. 4/11 PET available and reviewed.   Risks and benefits of image guided bone marrow biopsy was discussed with the patient and/or patient's family including, but not limited to bleeding, infection, damage to adjacent structures or low yield requiring additional tests.  All of the questions were answered and there is agreement to proceed.  Consent signed and in chart.   Thank you for allowing our service to participate in Bracken Moffa 's care.    Electronically Signed: Terressa Fess, NP   06/01/2023, 8:06 AM     I spent a total of  30 Minutes   in face to face in clinical consultation, greater than 50% of which was counseling/coordinating care for image guided bone marrow biopsy and aspiration   (A copy of  this note was sent to the referring provider and the time of visit.)

## 2023-05-31 NOTE — Telephone Encounter (Signed)
 I will send a message to Dr, Valentine Gasmen to put the med in for him to sign it

## 2023-06-01 ENCOUNTER — Other Ambulatory Visit: Payer: Self-pay

## 2023-06-01 ENCOUNTER — Ambulatory Visit
Admission: RE | Admit: 2023-06-01 | Discharge: 2023-06-01 | Disposition: A | Discharge status: Home / Self Care | Source: Ambulatory Visit | Attending: Internal Medicine | Admitting: Internal Medicine

## 2023-06-01 ENCOUNTER — Encounter: Payer: Self-pay | Admitting: Radiology

## 2023-06-01 DIAGNOSIS — I10 Essential (primary) hypertension: Secondary | ICD-10-CM | POA: Insufficient documentation

## 2023-06-01 DIAGNOSIS — C9002 Multiple myeloma in relapse: Secondary | ICD-10-CM | POA: Insufficient documentation

## 2023-06-01 DIAGNOSIS — Z85850 Personal history of malignant neoplasm of thyroid: Secondary | ICD-10-CM | POA: Insufficient documentation

## 2023-06-01 DIAGNOSIS — Z7984 Long term (current) use of oral hypoglycemic drugs: Secondary | ICD-10-CM | POA: Insufficient documentation

## 2023-06-01 DIAGNOSIS — E119 Type 2 diabetes mellitus without complications: Secondary | ICD-10-CM | POA: Diagnosis not present

## 2023-06-01 DIAGNOSIS — E785 Hyperlipidemia, unspecified: Secondary | ICD-10-CM | POA: Insufficient documentation

## 2023-06-01 DIAGNOSIS — Z79899 Other long term (current) drug therapy: Secondary | ICD-10-CM | POA: Insufficient documentation

## 2023-06-01 DIAGNOSIS — Z01818 Encounter for other preprocedural examination: Secondary | ICD-10-CM

## 2023-06-01 DIAGNOSIS — Z1379 Encounter for other screening for genetic and chromosomal anomalies: Secondary | ICD-10-CM | POA: Insufficient documentation

## 2023-06-01 DIAGNOSIS — K219 Gastro-esophageal reflux disease without esophagitis: Secondary | ICD-10-CM | POA: Diagnosis not present

## 2023-06-01 HISTORY — PX: IR BONE MARROW BIOPSY & ASPIRATION: IMG5727

## 2023-06-01 LAB — CBC WITH DIFFERENTIAL/PLATELET
Abs Immature Granulocytes: 0.01 10*3/uL (ref 0.00–0.07)
Basophils Absolute: 0 10*3/uL (ref 0.0–0.1)
Basophils Relative: 0 %
Eosinophils Absolute: 0.1 10*3/uL (ref 0.0–0.5)
Eosinophils Relative: 4 %
HCT: 40.7 % (ref 39.0–52.0)
Hemoglobin: 13.5 g/dL (ref 13.0–17.0)
Immature Granulocytes: 0 %
Lymphocytes Relative: 24 %
Lymphs Abs: 0.8 10*3/uL (ref 0.7–4.0)
MCH: 30.9 pg (ref 26.0–34.0)
MCHC: 33.2 g/dL (ref 30.0–36.0)
MCV: 93.1 fL (ref 80.0–100.0)
Monocytes Absolute: 0.5 10*3/uL (ref 0.1–1.0)
Monocytes Relative: 14 %
Neutro Abs: 2 10*3/uL (ref 1.7–7.7)
Neutrophils Relative %: 58 %
Platelets: 242 10*3/uL (ref 150–400)
RBC: 4.37 MIL/uL (ref 4.22–5.81)
RDW: 15.3 % (ref 11.5–15.5)
WBC: 3.4 10*3/uL — ABNORMAL LOW (ref 4.0–10.5)
nRBC: 0 % (ref 0.0–0.2)

## 2023-06-01 LAB — GLUCOSE, CAPILLARY
Glucose-Capillary: 123 mg/dL — ABNORMAL HIGH (ref 70–99)
Glucose-Capillary: 118 mg/dL — ABNORMAL HIGH (ref 70–99)

## 2023-06-01 MED ORDER — HEPARIN SOD (PORK) LOCK FLUSH 100 UNIT/ML IV SOLN
INTRAVENOUS | Status: AC
  Filled 2023-06-01: qty 5

## 2023-06-01 MED ORDER — MIDAZOLAM HCL 5 MG/5ML IJ SOLN
INTRAMUSCULAR | Status: AC | PRN
  Administered 2023-06-01: 1 mg via INTRAVENOUS

## 2023-06-01 MED ORDER — LIDOCAINE 1 % OPTIME INJ - NO CHARGE
10.0000 mL | Freq: Once | INTRAMUSCULAR | Status: AC
  Administered 2023-06-01: 10 mL via INTRADERMAL
  Filled 2023-06-01: qty 10

## 2023-06-01 MED ORDER — MIDAZOLAM HCL 2 MG/2ML IJ SOLN
INTRAMUSCULAR | Status: AC
  Filled 2023-06-01: qty 2

## 2023-06-01 MED ORDER — SODIUM CHLORIDE 0.9 % IV SOLN: INTRAVENOUS | Status: DC

## 2023-06-01 MED ORDER — FENTANYL CITRATE (PF) 100 MCG/2ML IJ SOLN
INTRAMUSCULAR | Status: AC | PRN
  Administered 2023-06-01: 50 ug via INTRAVENOUS

## 2023-06-01 MED ORDER — FENTANYL CITRATE (PF) 100 MCG/2ML IJ SOLN
INTRAMUSCULAR | Status: AC
  Filled 2023-06-01: qty 2

## 2023-06-01 NOTE — Procedures (Signed)
 Interventional Radiology Procedure Note  Procedure:  Image guided aspirate and core biopsy of left iliac bone Complications: None Recommendations: - Bedrest supine x 1 hrs - OTC's PRN  Pain - Follow biopsy results - ok to restart all home meds   Signed,  Marciano Settles. Mabel Savage, DO, ABVM, RPVI

## 2023-06-01 NOTE — Progress Notes (Signed)
 Patient clinically stable post IR BMB per Dr Mabel Savage, tolerated well. Vitals stable, bp unchanged post procedure from pre procedure. Received Versed  1 mg along with Fentanyl  50 mcg IV for procedure. Report given to Alyson Patterson RN post procedure/specials/18

## 2023-06-05 ENCOUNTER — Other Ambulatory Visit

## 2023-06-05 ENCOUNTER — Inpatient Hospital Stay (HOSPITAL_BASED_OUTPATIENT_CLINIC_OR_DEPARTMENT_OTHER): Admitting: Internal Medicine

## 2023-06-05 ENCOUNTER — Encounter: Payer: Self-pay | Admitting: Internal Medicine

## 2023-06-05 ENCOUNTER — Telehealth: Payer: Self-pay | Admitting: *Deleted

## 2023-06-05 ENCOUNTER — Inpatient Hospital Stay

## 2023-06-05 VITALS — BP 143/72

## 2023-06-05 VITALS — BP 150/86 | HR 75 | Temp 96.5°F | Resp 18 | Ht 70.0 in | Wt 211.3 lb

## 2023-06-05 DIAGNOSIS — C9002 Multiple myeloma in relapse: Secondary | ICD-10-CM | POA: Diagnosis not present

## 2023-06-05 DIAGNOSIS — Z5111 Encounter for antineoplastic chemotherapy: Secondary | ICD-10-CM | POA: Diagnosis not present

## 2023-06-05 LAB — CBC WITH DIFFERENTIAL (CANCER CENTER ONLY)
Abs Immature Granulocytes: 0.01 10*3/uL (ref 0.00–0.07)
Basophils Absolute: 0 10*3/uL (ref 0.0–0.1)
Basophils Relative: 0 %
Eosinophils Absolute: 0.1 10*3/uL (ref 0.0–0.5)
Eosinophils Relative: 2 %
HCT: 41.2 % (ref 39.0–52.0)
Hemoglobin: 13.4 g/dL (ref 13.0–17.0)
Immature Granulocytes: 0 %
Lymphocytes Relative: 18 %
Lymphs Abs: 0.7 10*3/uL (ref 0.7–4.0)
MCH: 30.3 pg (ref 26.0–34.0)
MCHC: 32.5 g/dL (ref 30.0–36.0)
MCV: 93.2 fL (ref 80.0–100.0)
Monocytes Absolute: 0.5 10*3/uL (ref 0.1–1.0)
Monocytes Relative: 15 %
Neutro Abs: 2.3 10*3/uL (ref 1.7–7.7)
Neutrophils Relative %: 65 %
Platelet Count: 234 10*3/uL (ref 150–400)
RBC: 4.42 MIL/uL (ref 4.22–5.81)
RDW: 14.7 % (ref 11.5–15.5)
WBC Count: 3.6 10*3/uL — ABNORMAL LOW (ref 4.0–10.5)
nRBC: 0 % (ref 0.0–0.2)

## 2023-06-05 LAB — CMP (CANCER CENTER ONLY)
ALT: 16 U/L (ref 0–44)
AST: 20 U/L (ref 15–41)
Albumin: 3.6 g/dL (ref 3.5–5.0)
Alkaline Phosphatase: 73 U/L (ref 38–126)
Anion gap: 7 (ref 5–15)
BUN: 11 mg/dL (ref 8–23)
CO2: 27 mmol/L (ref 22–32)
Calcium: 9 mg/dL (ref 8.9–10.3)
Chloride: 103 mmol/L (ref 98–111)
Creatinine: 1.34 mg/dL — ABNORMAL HIGH (ref 0.61–1.24)
GFR, Estimated: 54 mL/min — ABNORMAL LOW (ref >60)
Glucose, Bld: 119 mg/dL — ABNORMAL HIGH (ref 70–99)
Potassium: 3.4 mmol/L — ABNORMAL LOW (ref 3.5–5.1)
Sodium: 137 mmol/L (ref 135–145)
Total Bilirubin: 0.8 mg/dL (ref 0.0–1.2)
Total Protein: 8.2 g/dL — ABNORMAL HIGH (ref 6.5–8.1)

## 2023-06-05 LAB — TYPE AND SCREEN
ABO/RH(D): A POS
Antibody Screen: NEGATIVE

## 2023-06-05 LAB — SURGICAL PATHOLOGY

## 2023-06-05 MED ORDER — ACETAMINOPHEN 325 MG PO TABS
650.0000 mg | ORAL_TABLET | Freq: Once | ORAL | Status: AC
Start: 2023-06-05 — End: 2023-06-05
  Administered 2023-06-05: 650 mg via ORAL
  Filled 2023-06-05: qty 2

## 2023-06-05 MED ORDER — BORTEZOMIB CHEMO SQ INJECTION 3.5 MG (2.5MG/ML)
1.3000 mg/m2 | Freq: Once | INTRAMUSCULAR | Status: AC
  Administered 2023-06-05: 2.75 mg via SUBCUTANEOUS
  Filled 2023-06-05: qty 1.1

## 2023-06-05 MED ORDER — DARATUMUMAB-HYALURONIDASE-FIHJ 1800-30000 MG-UT/15ML ~~LOC~~ SOLN
1800.0000 mg | Freq: Once | SUBCUTANEOUS | Status: AC
  Administered 2023-06-05: 1800 mg via SUBCUTANEOUS
  Filled 2023-06-05: qty 15

## 2023-06-05 MED ORDER — MONTELUKAST SODIUM 10 MG PO TABS
10.0000 mg | ORAL_TABLET | Freq: Once | ORAL | Status: AC
Start: 2023-06-05 — End: 2023-06-05
  Administered 2023-06-05: 10 mg via ORAL
  Filled 2023-06-05: qty 1

## 2023-06-05 MED ORDER — DIPHENHYDRAMINE HCL 25 MG PO CAPS
50.0000 mg | ORAL_CAPSULE | Freq: Once | ORAL | Status: AC
Start: 2023-06-05 — End: 2023-06-05
  Administered 2023-06-05: 50 mg via ORAL
  Filled 2023-06-05: qty 2

## 2023-06-05 MED ORDER — DEXAMETHASONE 4 MG PO TABS
20.0000 mg | ORAL_TABLET | Freq: Once | ORAL | Status: AC
  Administered 2023-06-05: 20 mg via ORAL
  Filled 2023-06-05: qty 5

## 2023-06-05 NOTE — Progress Notes (Signed)
 [facility] OFFICE PROGRESS NOTE  Patient Care Team: [name], MD as PCP - General (Internal Medicine) Eduardo Grade, MD as Consulting Physician (Dermatology) [name], MD as Consulting Physician (Hematology and Oncology)   Cancer Staging  Multiple myeloma in relapse [facility]) Staging form: Plasma Cell Myeloma and Plasma Cell Disorders, AJCC 8th Edition - Clinical: No stage assigned - Unsigned - Clinical: No stage assigned - Unsigned    Oncology History Overview Note   # April 2014- MULTIPLE MYELOMA [IgG- 2.2gm/dl; 98-11% plasma cell; Cyto-N; FISH- gain of chr.9, 15 & ccnd1/11q13] s/p RVD x6 [sep 2014; BMBx- ~5% plasma cells]; Maint Rev-DEX-Zometa ; March 2015-Stopped Dex Cont- Rev-Zometa ; July 2016- Rev 25mg ; NOV 4th  2016-HOLD; BMT eval at [facility] [Dr.Woods]; declined BMT.   # Re-START FEB 2017 Rev 15mg  3 W- on & 1 w OFF; HELD Rev June- Oct 2020- sec to worsening renal function [Dr.Singh]; SEP 2021- Rev 15 mg 2 w-On & 2 w-OFF. STOP REVLIMD in APRIL 2025-progression.    # APRIL 2025- . Diffuse lytic myelomatous lesions throughout the skeleton. Most of these lesions demonstrate hypermetabolism; Healing left mid clavicle fracture, likely pathologic; Early nondisplaced pathological fracture involving the right mid clavicle; Left pubic bone lesion has a defect in the outer cortex and this could be a pending pathologic fracture; . No findings for extraskeletal myeloma. Bone marrow Bx- on APRIL 25th, 2025- Pending.  # April 2025 discontinue lenalidomide .  # APRIL 29th, 2025- start Dara-Velcade -Dex   # Zometa   # CKD [creat ~1.4; sec MM; Dr.Singh]; DM  -------------------------------------------------------------------------   DIAGNOSIS: Alan.Alexander ] MULTIPLE MYELOMA  GOALS: control     Multiple myeloma in remission (HCC)  10/01/2015 Initial Diagnosis   Multiple myeloma in remission (HCC)   Multiple myeloma in relapse (HCC)  05/14/2023 Initial Diagnosis    Multiple myeloma in relapse (HCC)   06/05/2023 -  Chemotherapy   Patient is on Treatment Plan : MYELOMA RELAPSED / REFRACTORY Daratumumab SQ + Bortezomib + Dexamethasone (DaraVd) q21d / Daratumumab SQ q28d       INTERVAL HISTORY: pt is  with wife.  Ambulating independently.   Alan Alexander 78 y.o.  male pleasant patient above history of recurrent multiple myeloma  is here for follow-up/ and review the results of the bone marrow biopsy and to proceed with chemotherapy.   Patient received Zometa  approximately 4 weeks ago.  Patient also started on tramadol  1 pills every 6-8 hours.  Noted to have improvement of his hip pain.  Denies pain. Sleeps good. Denies weakness. Denies any difficulties or concerns today.   Patient denies any skin rash.  Denies any diarrhea.  No swelling in the legs.  No nausea vomiting.   Review of Systems  Constitutional:  Negative for chills, diaphoresis, fever, malaise/fatigue and weight loss.  HENT:  Negative for nosebleeds and sore throat.   Eyes:  Negative for double vision.  Respiratory:  Negative for cough, hemoptysis, sputum production, shortness of breath and wheezing.   Cardiovascular:  Positive for leg swelling. Negative for chest pain, palpitations and orthopnea.  Gastrointestinal:  Negative for abdominal pain, blood in stool, constipation, diarrhea, heartburn, melena, nausea and vomiting.  Genitourinary:  Negative for dysuria, frequency and urgency.  Musculoskeletal:  Positive for back pain and joint pain.  Skin: Negative.  Negative for itching and rash.  Neurological:  Negative for dizziness, tingling, focal weakness, weakness and headaches.  Endo/Heme/Allergies:  Does not bruise/bleed easily.  Psychiatric/Behavioral:  Negative for depression. The patient is  not nervous/anxious and does not have insomnia.       PAST MEDICAL HISTORY :  Past Medical History:  Diagnosis Date   Diabetes mellitus without complication (HCC)    GERD (gastroesophageal  reflux disease)    Hyperlipidemia    Hypertension    Multiple myeloma (HCC)    Obesity    Thyroid cancer (HCC)    Thyroid disease     PAST SURGICAL HISTORY :   Past Surgical History:  Procedure Laterality Date   BONE MARROW BIOPSY  2014   CATARACT EXTRACTION BILATERAL W/ ANTERIOR VITRECTOMY     IR BONE MARROW BIOPSY & ASPIRATION  06/01/2023    FAMILY HISTORY :   Family History  Problem Relation Age of Onset   Cancer Father 45   Diabetes Mother     SOCIAL HISTORY:   Social History   Tobacco Use   Smoking status: Never   Smokeless tobacco: Never  Substance Use Topics   Alcohol use: No   Drug use: No    ALLERGIES:  has no known allergies.  MEDICATIONS:  Current Outpatient Medications  Medication Sig Dispense Refill   amLODipine  (NORVASC ) 10 MG tablet Take 10 mg by mouth daily.     aspirin EC 81 MG tablet Take 81 mg by mouth daily.     atorvastatin  (LIPITOR) 20 MG tablet Take 20 mg by mouth daily.     cloNIDine  (CATAPRES ) 0.1 MG tablet Take 0.1 mg by mouth 2 (two) times daily.     Cyanocobalamin 1000 MCG TBCR Take by mouth.     cyclobenzaprine  (FLEXERIL ) 5 MG tablet Take 1 tablet (5 mg total) by mouth 3 (three) times daily as needed. 15 tablet 0   dapagliflozin propanediol (FARXIGA) 10 MG TABS tablet TAKE 10 MG BY MOUTH 1 (ONE) TIME EACH DAY IN THE MORNING     ergocalciferol  (VITAMIN D2) 1.25 MG (50000 UT) capsule Take 1 capsule (50,000 Units total) by mouth once a week. 12 capsule 1   glipiZIDE (GLUCOTROL) 5 MG tablet Take 5 mg by mouth every morning.     levothyroxine  (SYNTHROID ) 137 MCG tablet Take 1 tablet by mouth daily.      lisinopril (ZESTRIL) 20 MG tablet Take 20 mg by mouth daily.     naproxen  (NAPROSYN ) 500 MG tablet Take 1 tablet (500 mg total) by mouth 2 (two) times daily with a meal. 20 tablet 2   ONETOUCH DELICA LANCETS 33G MISC Inject 1 Lancet as directed once daily.     pioglitazone  (ACTOS ) 30 MG tablet Take 30 mg by mouth daily.     potassium  chloride SA (K-DUR,KLOR-CON ) 20 MEQ tablet Take 20 mEq by mouth 2 (two) times daily.   0   quinapril  (ACCUPRIL ) 40 MG tablet Take 40 mg by mouth daily.     torsemide  (DEMADEX ) 20 MG tablet Take 20 mg by mouth daily as needed.     traMADol  (ULTRAM ) 50 MG tablet Take 1 tablet (50 mg total) by mouth every 6 (six) hours as needed for moderate pain (pain score 4-6). 45 tablet 0   acyclovir  (ZOVIRAX ) 400 MG tablet Take 1 tablet (400 mg total) by mouth 2 (two) times daily. (Patient not taking: Reported on 06/05/2023) 120 tablet 2   ondansetron  (ZOFRAN ) 8 MG tablet One pill every 8 hours as needed for nausea/vomitting. (Patient not taking: Reported on 06/05/2023) 40 tablet 1   prochlorperazine  (COMPAZINE ) 10 MG tablet Take 1 tablet (10 mg total) by mouth every 6 (six) hours  as needed for nausea or vomiting. (Patient not taking: Reported on 06/05/2023) 40 tablet 1   No current facility-administered medications for this visit.   Facility-Administered Medications Ordered in Other Visits  Medication Dose Route Frequency Provider Last Rate Last Admin   bortezomib SQ (VELCADE) chemo injection (2.5mg /mL concentration) 2.75 mg  1.3 mg/m2 (Treatment Plan Recorded) Subcutaneous Once Mackayla Mullins R, MD       daratumumab-hyaluronidase-fihj (DARZALEX FASPRO) 1800-30000 MG-UT/15ML chemo SQ injection 1,800 mg  1,800 mg Subcutaneous Once Pierce Barocio R, MD        PHYSICAL EXAMINATION: ECOG PERFORMANCE STATUS: 0 - Asymptomatic  BP (!) 150/86 (BP Location: Left Arm, Patient Position: Sitting, Cuff Size: Large)   Pulse 75   Temp (!) 96.5 F (35.8 C) (Tympanic)   Resp 18   Ht 5\' 10"  (1.778 m)   Wt 211 lb 4.8 oz (95.8 kg)   SpO2 100%   BMI 30.32 kg/m   Filed Weights   06/05/23 0817  Weight: 211 lb 4.8 oz (95.8 kg)      Physical Exam Vitals and nursing note reviewed.  HENT:     Head: Normocephalic and atraumatic.     Mouth/Throat:     Pharynx: Oropharynx is clear. No oropharyngeal exudate.   Eyes:     Extraocular Movements: Extraocular movements intact.     Pupils: Pupils are equal, round, and reactive to light.  Cardiovascular:     Rate and Rhythm: Normal rate and regular rhythm.  Pulmonary:     Effort: No respiratory distress.     Breath sounds: No wheezing.     Comments: Decreased breath sounds bilaterally.  Abdominal:     General: Bowel sounds are normal. There is no distension.     Palpations: Abdomen is soft. There is no mass.     Tenderness: There is no abdominal tenderness. There is no guarding or rebound.  Musculoskeletal:        General: No tenderness. Normal range of motion.     Cervical back: Normal range of motion and neck supple.  Skin:    General: Skin is warm.  Neurological:     General: No focal deficit present.     Mental Status: He is alert and oriented to person, place, and time.  Psychiatric:        Mood and Affect: Affect normal.        Behavior: Behavior normal.        Judgment: Judgment normal.       LABORATORY DATA:  I have reviewed the data as listed    Component Value Date/Time   NA 137 06/05/2023 0816   NA 139 05/01/2014 1322   K 3.4 (L) 06/05/2023 0816   K 4.4 05/01/2014 1322   CL 103 06/05/2023 0816   CL 107 05/01/2014 1322   CO2 27 06/05/2023 0816   CO2 27 05/01/2014 1322   GLUCOSE 119 (H) 06/05/2023 0816   GLUCOSE 102 (H) 05/01/2014 1322   BUN 11 06/05/2023 0816   BUN 16 05/01/2014 1322   CREATININE 1.34 (H) 06/05/2023 0816   CREATININE 1.48 (H) 05/29/2014 0949   CALCIUM 9.0 06/05/2023 0816   CALCIUM 9.1 05/01/2014 1322   PROT 8.2 (H) 06/05/2023 0816   PROT 7.5 05/01/2014 1322   ALBUMIN 3.6 06/05/2023 0816   ALBUMIN 3.6 05/01/2014 1322   AST 20 06/05/2023 0816   ALT 16 06/05/2023 0816   ALT 13 (L) 05/01/2014 1322   ALKPHOS 73 06/05/2023 0816   ALKPHOS 47  05/01/2014 1322   BILITOT 0.8 06/05/2023 0816   GFRNONAA 54 (L) 06/05/2023 0816   GFRNONAA 47 (L) 05/29/2014 0949   GFRAA 47 (L) 10/21/2019 1106   GFRAA 55  (L) 05/29/2014 0949    No results found for: "SPEP", "UPEP"  Lab Results  Component Value Date   WBC 3.6 (L) 06/05/2023   NEUTROABS 2.3 06/05/2023   HGB 13.4 06/05/2023   HCT 41.2 06/05/2023   MCV 93.2 06/05/2023   PLT 234 06/05/2023      Chemistry      Component Value Date/Time   NA 137 06/05/2023 0816   NA 139 05/01/2014 1322   K 3.4 (L) 06/05/2023 0816   K 4.4 05/01/2014 1322   CL 103 06/05/2023 0816   CL 107 05/01/2014 1322   CO2 27 06/05/2023 0816   CO2 27 05/01/2014 1322   BUN 11 06/05/2023 0816   BUN 16 05/01/2014 1322   CREATININE 1.34 (H) 06/05/2023 0816   CREATININE 1.48 (H) 05/29/2014 0949      Component Value Date/Time   CALCIUM 9.0 06/05/2023 0816   CALCIUM 9.1 05/01/2014 1322   ALKPHOS 73 06/05/2023 0816   ALKPHOS 47 05/01/2014 1322   AST 20 06/05/2023 0816   ALT 16 06/05/2023 0816   ALT 13 (L) 05/01/2014 1322   BILITOT 0.8 06/05/2023 0816       RADIOGRAPHIC STUDIES: I have personally reviewed the radiological images as listed and agreed with the findings in the report. No results found.   ASSESSMENT & PLAN:  Multiple myeloma in relapse (HCC) #Multiple myeloma-most recently on maintenance Revlimid  15 mg 2  weeks on 2 week off.  JAN 2025-  M-protein- 1.3[April 2023- NEG]; kappa=113 lambda light chain ratio=2.5 [July 2020- 2.0]. RISING- ca- 10.4. # APRIL 2025- . Diffuse lytic myelomatous lesions throughout the skeleton. Most of these lesions demonstrate hypermetabolism; Healing left mid clavicle fracture, likely pathologic; Early nondisplaced pathological fracture involving the right mid clavicle; Left pubic bone lesion has a defect in the outer cortex and this could be a pending pathologic fracture; . No findings for extraskeletal myeloma. Bone marrow Bx- on APRIL 25th, 2025- Pending results.  April 2025 discontinue lenalidomide . Recommend starting Dara Velcade dexamethasone.    # proceed with Dara Velcade dexamethasone.  cycle # 1 day 1 today.  Labs-CBC/chemistries were reviewed with the patient.  Reviewed that current therapy might put in remission.  However will need to consider consolidation therapy.  Patient has previously declined central transplant axis.  Discussed regarding the role of consolidation/second line CAR-T therapy.  Will refer to [facility] consideration re:CAR-T therapy;  Discussed with possible side effects including but not limited to side effects of daratumumab, Velcade and dexamethasone-infusion reactions neuropathy elevated blood glucose risk of infections etc.  # Hyperkalemia/hypokalemia- 3.4 -on Kdur 2 pills a day-   # acute back pain: Progressive bone lesions on PET scan April 2025-status post Zometa  improved with tramadol .   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 stable. April 2024- Vit D 38- recommend Vit D 50,K/weekly  # DM- on OHA - FBG- 119-recommend close monitoring of blood glucose levels. Recommend bringing log at this time.   # infection prophylaxis: Continue  acyclovir  twice a day.  Prescription sent.  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  However patient is currently doing well without any evidence of progression.  Patient interested in continue current scope of therapy; and full code.    ; velcade  twice weekly- Dara-Velcade q Weekly x 4 cycles- starting # 5-8 dara q 3W; cycles 9+- dara q 28 days  valcade;  Zometa  q 6 M-last 05/14/2023   PS; Stage;ACP- Disposition: # refer to [facility] consideration re:CAR-T therapy; # follow up in 1 week- MD: labs- cbc/cmp; chemo; Zometa - d-4 chemo # follow up in 2 week- MD: labs- cbc/cmp; chemo; d-4 chemod #  follow up in 3 week- MD: labs- cbc/cmp; chemo; -4 chemo #  follow up in 4 week- MD: labs- cbc/cmp; MM panel. K/l light chains-chemo; -4 chemo-Dr.B  # 40 minutes face-to-face with the patient discussing the above plan of care; more than 50% of time spent on prognosis/ natural history; counseling and  coordination.        Orders Placed This Encounter  Procedures   CBC with Differential (Cancer Center Only)    Standing Status:   Future    Expected Date:   06/26/2023    Expiration Date:   06/25/2024   CMP (Cancer Center only)    Standing Status:   Future    Expected Date:   06/26/2023    Expiration Date:   06/25/2024   Beta 2 microglobulin, serum    Standing Status:   Future    Expected Date:   06/12/2023    Expiration Date:   06/04/2024   CBC with Differential (Cancer Center Only)    4 weeks    Standing Status:   Future    Expected Date:   07/03/2023    Expiration Date:   06/04/2024   CMP (Cancer Center only)    4 weeks    Standing Status:   Future    Expected Date:   07/03/2023    Expiration Date:   06/04/2024   Multiple Myeloma Panel (SPEP&IFE w/QIG)    4 weeks    Standing Status:   Future    Expected Date:   07/03/2023    Expiration Date:   06/04/2024   Kappa/lambda light chains    4 weeks    Standing Status:   Future    Expected Date:   07/03/2023    Expiration Date:   06/04/2024    All questions were answered. The patient knows to call the clinic with any problems, questions or concerns.      [name], MD 06/05/2023 10:16 AM

## 2023-06-05 NOTE — Patient Instructions (Signed)
 [name] - A DEPT OF [facility]  Discharge Instructions: Thank you for choosing [facility] to provide your oncology and hematology care.  If you have a lab appointment with the Cancer Center, please go directly to the Cancer Center and check in at the registration area.  Wear comfortable clothing and clothing appropriate for easy access to any Portacath or PICC line.   We strive to give you quality time with your provider. You may need to reschedule your appointment if you arrive late (15 or more minutes).  Arriving late affects you and other patients whose appointments are after yours.  Also, if you miss three or more appointments without notifying the office, you may be dismissed from the clinic at the provider's discretion.      For prescription refill requests, have your pharmacy contact our office and allow 72 hours for refills to be completed.    Today you received the following chemotherapy and/or immunotherapy agents DARZALEX and VELCADE     To help prevent nausea and vomiting after your treatment, we encourage you to take your nausea medication as directed.  BELOW ARE SYMPTOMS THAT SHOULD BE REPORTED IMMEDIATELY: *FEVER GREATER THAN 100.4 F (38 C) OR HIGHER *CHILLS OR SWEATING *NAUSEA AND VOMITING THAT IS NOT CONTROLLED WITH YOUR NAUSEA MEDICATION *UNUSUAL SHORTNESS OF BREATH *UNUSUAL BRUISING OR BLEEDING *URINARY PROBLEMS (pain or burning when urinating, or frequent urination) *BOWEL PROBLEMS (unusual diarrhea, constipation, pain near the anus) TENDERNESS IN MOUTH AND THROAT WITH OR WITHOUT PRESENCE OF ULCERS (sore throat, sores in mouth, or a toothache) UNUSUAL RASH, SWELLING OR PAIN  UNUSUAL VAGINAL DISCHARGE OR ITCHING   Items with * indicate a potential emergency and should be followed up as soon as possible or go to the Emergency Department if any problems should occur.  Please show the CHEMOTHERAPY ALERT CARD or  IMMUNOTHERAPY ALERT CARD at check-in to the Emergency Department and triage nurse.  Should you have questions after your visit or need to cancel or reschedule your appointment, please contact [name] - A DEPT OF [facility]igh[facility]ideg[facility]  [phone] and follow the prompts.  Office hours are 8:00 a.m. to 4:30 p.m. Monday - Friday. Please note that voicemails left after 4:00 p.m. may not be returned until the following business day.  We are closed weekends and major holidays. You have access to a nurse at all times for urgent questions. Please call the main number to the clinic [phone] and follow the prompts.  For any non-urgent questions, you may also contact your provider using MyChart. We now offer e-Visits for anyone 13 and older to request care online for non-urgent symptoms. For details visit [email or web address].   Also download the MyChart app! Go to the app store, search "MyChart", open the app, select [place], and log in with your MyChart username and password.  Daratumumab; Hyaluronidase Injection What is this medication? DARATUMUMAB; HYALURONIDASE (dar a toom ue mab; hye al ur ON i dase) treats multiple myeloma, a type of bone marrow cancer. Daratumumab works by blocking a protein that causes cancer cells to grow and multiply. This helps to slow or stop the spread of cancer cells. Hyaluronidase works by increasing the absorption of other medications in the body to help them work better. This medication may also be used treat amyloidosis, a condition that causes the buildup of a protein (amyloid) in your body. It works by reducing the  buildup of this protein, which decreases symptoms. It is a combination medication that contains a monoclonal antibody. This medicine may be used for other purposes; ask your health care provider or pharmacist if you have questions. COMMON BRAND NAME(S): DARZALEX FASPRO What should I tell my care team before I take this  medication? They need to know if you have any of these conditions: Heart disease Infection, such as chickenpox, cold sores, herpes, hepatitis B Lung or breathing disease An unusual or allergic reaction to daratumumab, hyaluronidase, other medications, foods, dyes, or preservatives Pregnant or trying to get pregnant Breast-feeding How should I use this medication? This medication is injected under the skin. It is given by your care team in a hospital or clinic setting. Talk to your care team about the use of this medication in children. Special care may be needed. Overdosage: If you think you have taken too much of this medicine contact a poison control center or emergency room at once. NOTE: This medicine is only for you. Do not share this medicine with others. What if I miss a dose? Keep appointments for follow-up doses. It is important not to miss your dose. Call your care team if you are unable to keep an appointment. What may interact with this medication? Interactions have not been studied. This list may not describe all possible interactions. Give your health care provider a list of all the medicines, herbs, non-prescription drugs, or dietary supplements you use. Also tell them if you smoke, drink alcohol, or use illegal drugs. Some items may interact with your medicine. What should I watch for while using this medication? Your condition will be monitored carefully while you are receiving this medication. This medication can cause serious allergic reactions. To reduce your risk, your care team may give you other medication to take before receiving this one. Be sure to follow the directions from your care team. This medication can affect the results of blood tests to match your blood type. These changes can last for up to 6 months after the final dose. Your care team will do blood tests to match your blood type before you start treatment. Tell all of your care team that you are being  treated with this medication before receiving a blood transfusion. This medication can affect the results of some tests used to determine treatment response; extra tests may be needed to evaluate response. Talk to your care team if you wish to become pregnant or think you are pregnant. This medication can cause serious birth defects if taken during pregnancy and for 3 months after the last dose. A reliable form of contraception is recommended while taking this medication and for 3 months after the last dose. Talk to your care team about effective forms of contraception. Do not breast-feed while taking this medication. What side effects may I notice from receiving this medication? Side effects that you should report to your care team as soon as possible: Allergic reactions--skin rash, itching, hives, swelling of the face, lips, tongue, or throat Heart rhythm changes--fast or irregular heartbeat, dizziness, feeling faint or lightheaded, chest pain, trouble breathing Infection--fever, chills, cough, sore throat, wounds that don't heal, pain or trouble when passing urine, general feeling of discomfort or being unwell Infusion reactions--chest pain, shortness of breath or trouble breathing, feeling faint or lightheaded Sudden eye pain or change in vision such as blurry vision, seeing halos around lights, vision loss Unusual bruising or bleeding Side effects that usually do not require medical attention (report to  your care team if they continue or are bothersome): Constipation Diarrhea Fatigue Nausea Pain, tingling, or numbness in the hands or feet Swelling of the ankles, hands, or feet This list may not describe all possible side effects. Call your doctor for medical advice about side effects. You may report side effects to FDA at 1-800-FDA-1088. Where should I keep my medication? This medication is given in a hospital or clinic. It will not be stored at home. NOTE: This sheet is a summary. It may  not cover all possible information. If you have questions about this medicine, talk to your doctor, pharmacist, or health care provider.  2024 Elsevier/Gold Standard (2021-05-31 00:00:00)  Bortezomib Injection What is this medication? BORTEZOMIB (bor TEZ oh mib) treats lymphoma. It may also be used to treat multiple myeloma, a type of bone marrow cancer. It works by blocking a protein that causes cancer cells to grow and multiply. This helps to slow or stop the spread of cancer cells. This medicine may be used for other purposes; ask your health care provider or pharmacist if you have questions. COMMON BRAND NAME(S): BORUZU, Velcade What should I tell my care team before I take this medication? They need to know if you have any of these conditions: Dehydration Diabetes Heart disease Liver disease Tingling of the fingers or toes or other nerve disorder An unusual or allergic reaction to bortezomib, other medications, foods, dyes, or preservatives If you or your partner are pregnant or trying to get pregnant Breastfeeding How should I use this medication? This medication is injected into a vein or under the skin. It is given by your care team in a hospital or clinic setting. Talk to your care team about the use of this medication in children. Special care may be needed. Overdosage: If you think you have taken too much of this medicine contact a poison control center or emergency room at once. NOTE: This medicine is only for you. Do not share this medicine with others. What if I miss a dose? Keep appointments for follow-up doses. It is important not to miss your dose. Call your care team if you are unable to keep an appointment. What may interact with this medication? Ketoconazole Rifampin This list may not describe all possible interactions. Give your health care provider a list of all the medicines, herbs, non-prescription drugs, or dietary supplements you use. Also tell them if you  smoke, drink alcohol, or use illegal drugs. Some items may interact with your medicine. What should I watch for while using this medication? Your condition will be monitored carefully while you are receiving this medication. You may need blood work while taking this medication. This medication may affect your coordination, reaction time, or judgment. Do not drive or operate machinery until you know how this medication affects you. Sit up or stand slowly to reduce the risk of dizzy or fainting spells. Drinking alcohol with this medication can increase the risk of these side effects. This medication may increase your risk of getting an infection. Call your care team for advice if you get a fever, chills, sore throat, or other symptoms of a cold or flu. Do not treat yourself. Try to avoid being around people who are sick. Check with your care team if you have severe diarrhea, nausea, and vomiting, or if you sweat a lot. The loss of too much body fluid may make it dangerous for you to take this medication. Talk to your care team if you may be pregnant. Serious  birth defects can occur if you take this medication during pregnancy and for 7 months after the last dose. You will need a negative pregnancy test before starting this medication. Contraception is recommended while taking this medication and for 7 months after the last dose. Your care team can help you find the option that works for you. If your partner can get pregnant, use a condom during sex while taking this medication and for 4 months after the last dose. Do not breastfeed while taking this medication and for 2 months after the last dose. This medication may cause infertility. Talk to your care team if you are concerned about your fertility. What side effects may I notice from receiving this medication? Side effects that you should report to your care team as soon as possible: Allergic reactions--skin rash, itching, hives, swelling of the face,  lips, tongue, or throat Bleeding--bloody or black, tar-like stools, vomiting blood or brown material that looks like coffee grounds, red or dark brown urine, small red or purple spots on skin, unusual bruising or bleeding Bleeding in the brain--severe headache, stiff neck, confusion, dizziness, change in vision, numbness or weakness of the face, arm, or leg, trouble speaking, trouble walking, vomiting Bowel blockage--stomach cramping, unable to have a bowel movement or pass gas, loss of appetite, vomiting Heart failure--shortness of breath, swelling of the ankles, feet, or hands, sudden weight gain, unusual weakness or fatigue Infection--fever, chills, cough, sore throat, wounds that don't heal, pain or trouble when passing urine, general feeling of discomfort or being unwell Liver injury--right upper belly pain, loss of appetite, nausea, light-colored stool, dark yellow or brown urine, yellowing skin or eyes, unusual weakness or fatigue Low blood pressure--dizziness, feeling faint or lightheaded, blurry vision Lung injury--shortness of breath or trouble breathing, cough, spitting up blood, chest pain, fever Pain, tingling, or numbness in the hands or feet Severe or prolonged diarrhea Stomach pain, bloody diarrhea, pale skin, unusual weakness or fatigue, decrease in the amount of urine, which may be signs of hemolytic uremic syndrome Sudden and severe headache, confusion, change in vision, seizures, which may be signs of posterior reversible encephalopathy syndrome (PRES) TTP--purple spots on the skin or inside the mouth, pale skin, yellowing skin or eyes, unusual weakness or fatigue, fever, fast or irregular heartbeat, confusion, change in vision, trouble speaking, trouble walking Tumor lysis syndrome (TLS)--nausea, vomiting, diarrhea, decrease in the amount of urine, dark urine, unusual weakness or fatigue, confusion, muscle pain or cramps, fast or irregular heartbeat, joint pain Side effects that  usually do not require medical attention (report to your care team if they continue or are bothersome): Constipation Diarrhea Fatigue Loss of appetite Nausea This list may not describe all possible side effects. Call your doctor for medical advice about side effects. You may report side effects to FDA at 1-800-FDA-1088. Where should I keep my medication? This medication is given in a hospital or clinic. It will not be stored at home. NOTE: This sheet is a summary. It may not cover all possible information. If you have questions about this medicine, talk to your doctor, pharmacist, or health care provider.  2024 Elsevier/Gold Standard (2021-06-28 00:00:00)

## 2023-06-05 NOTE — Progress Notes (Signed)
 Pt hasn't started the acyclovir  yet.  Pt had bone marrow biopsy last 06/01/23. Results not in yet.

## 2023-06-05 NOTE — Assessment & Plan Note (Addendum)
#  Multiple myeloma-most recently on maintenance Revlimid  15 mg 2  weeks on 2 week off.  JAN 2025-  M-protein- 1.3[April 2023- NEG]; kappa=113 lambda light chain ratio=2.5 [July 2020- 2.0]. RISING- ca- 10.4. # APRIL 2025- . Diffuse lytic myelomatous lesions throughout the skeleton. Most of these lesions demonstrate hypermetabolism; Healing left mid clavicle fracture, likely pathologic; Early nondisplaced pathological fracture involving the right mid clavicle; Left pubic bone lesion has a defect in the outer cortex and this could be a pending pathologic fracture; . No findings for extraskeletal myeloma. Bone marrow Bx- on APRIL 25th, 2025- Pending results.  April 2025 discontinue lenalidomide . Recommend starting Dara Velcade dexamethasone.    # proceed with Dara Velcade dexamethasone.  cycle # 1 day 1 today. Labs-CBC/chemistries were reviewed with the patient.  Reviewed that current therapy might put in remission.  However will need to consider consolidation therapy.  Patient has previously declined central transplant axis.  Discussed regarding the role of consolidation/second line CAR-T therapy.  Will refer to [facility] consideration re:CAR-T therapy;  Discussed with possible side effects including but not limited to side effects of daratumumab, Velcade and dexamethasone-infusion reactions neuropathy elevated blood glucose risk of infections etc.  # Hyperkalemia/hypokalemia- 3.4 -on Kdur 2 pills a day-   # acute back pain: Progressive bone lesions on PET scan April 2025-status post Zometa  improved with tramadol .   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 stable. April 2024- Vit D 38- recommend Vit D 50,K/weekly  # DM- on OHA - FBG- 119-recommend close monitoring of blood glucose levels. Recommend bringing log at this time.   # infection prophylaxis: Continue  acyclovir  twice a day.  Prescription sent.  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  However  patient is currently doing well without any evidence of progression.  Patient interested in continue current scope of therapy; and full code.    ; velcade twice weekly- Dara-Velcade q Weekly x 4 cycles- starting # 5-8 dara q 3W; cycles 9+- dara q 28 days  valcade;  Zometa  q 6 M-last 05/14/2023   PS; Stage;ACP- Disposition: # refer to [facility] consideration re:CAR-T therapy; # follow up in 1 week- MD: labs- cbc/cmp; chemo; Zometa - d-4 chemo # follow up in 2 week- MD: labs- cbc/cmp; chemo; d-4 chemod #  follow up in 3 week- MD: labs- cbc/cmp; chemo; -4 chemo #  follow up in 4 week- MD: labs- cbc/cmp; MM panel. K/l light chains-chemo; -4 chemo-Dr.B  # 40 minutes face-to-face with the patient discussing the above plan of care; more than 50% of time spent on prognosis/ natural history; counseling and coordination.

## 2023-06-05 NOTE — Telephone Encounter (Signed)
 Faxed referral to [facility] for consideration of CAR-T therapy.  Fax confirmation received.

## 2023-06-06 ENCOUNTER — Other Ambulatory Visit: Payer: Self-pay

## 2023-06-07 LAB — PRETREATMENT RBC PHENOTYPE

## 2023-06-08 ENCOUNTER — Inpatient Hospital Stay: Attending: Internal Medicine

## 2023-06-08 ENCOUNTER — Encounter (HOSPITAL_COMMUNITY): Payer: Self-pay | Admitting: Internal Medicine

## 2023-06-08 VITALS — BP 146/74 | HR 75 | Temp 97.5°F | Resp 16

## 2023-06-08 DIAGNOSIS — R112 Nausea with vomiting, unspecified: Secondary | ICD-10-CM | POA: Insufficient documentation

## 2023-06-08 DIAGNOSIS — N183 Chronic kidney disease, stage 3 unspecified: Secondary | ICD-10-CM | POA: Diagnosis not present

## 2023-06-08 DIAGNOSIS — E875 Hyperkalemia: Secondary | ICD-10-CM | POA: Diagnosis not present

## 2023-06-08 DIAGNOSIS — E1122 Type 2 diabetes mellitus with diabetic chronic kidney disease: Secondary | ICD-10-CM | POA: Diagnosis not present

## 2023-06-08 DIAGNOSIS — E039 Hypothyroidism, unspecified: Secondary | ICD-10-CM | POA: Diagnosis not present

## 2023-06-08 DIAGNOSIS — E876 Hypokalemia: Secondary | ICD-10-CM | POA: Diagnosis not present

## 2023-06-08 DIAGNOSIS — Z5112 Encounter for antineoplastic immunotherapy: Secondary | ICD-10-CM | POA: Diagnosis not present

## 2023-06-08 DIAGNOSIS — C9002 Multiple myeloma in relapse: Secondary | ICD-10-CM | POA: Insufficient documentation

## 2023-06-08 DIAGNOSIS — Z5111 Encounter for antineoplastic chemotherapy: Secondary | ICD-10-CM | POA: Diagnosis present

## 2023-06-08 MED ORDER — DEXAMETHASONE 4 MG PO TABS
20.0000 mg | ORAL_TABLET | Freq: Once | ORAL | Status: AC
  Administered 2023-06-08: 20 mg via ORAL
  Filled 2023-06-08: qty 5

## 2023-06-08 MED ORDER — BORTEZOMIB CHEMO SQ INJECTION 3.5 MG (2.5MG/ML)
1.3000 mg/m2 | Freq: Once | INTRAMUSCULAR | Status: AC
  Administered 2023-06-08: 2.75 mg via SUBCUTANEOUS
  Filled 2023-06-08: qty 1.1

## 2023-06-08 NOTE — Patient Instructions (Signed)
 [name]  Discharge Instructions: Thank you for choosing [facility] to provide your oncology and hematology care.  If you have a lab appointment with the Cancer Center, please go directly to the Cancer Center and check in at the registration area.  Wear comfortable clothing and clothing appropriate for easy access to any Portacath or PICC line.   We strive to give you quality time with your provider. You may need to reschedule your appointment if you arrive late (15 or more minutes).  Arriving late affects you and other patients whose appointments are after yours.  Also, if you miss three or more appointments without notifying the office, you may be dismissed from the clinic at the provider's discretion.      For prescription refill requests, have your pharmacy contact our office and allow 72 hours for refills to be completed.    Today you received the following chemotherapy and/or immunotherapy agents- velcade      To help prevent nausea and vomiting after your treatment, we encourage you to take your nausea medication as directed.  BELOW ARE SYMPTOMS THAT SHOULD BE REPORTED IMMEDIATELY: *FEVER GREATER THAN 100.4 F (38 C) OR HIGHER *CHILLS OR SWEATING *NAUSEA AND VOMITING THAT IS NOT CONTROLLED WITH YOUR NAUSEA MEDICATION *UNUSUAL SHORTNESS OF BREATH *UNUSUAL BRUISING OR BLEEDING *URINARY PROBLEMS (pain or burning when urinating, or frequent urination) *BOWEL PROBLEMS (unusual diarrhea, constipation, pain near the anus) TENDERNESS IN MOUTH AND THROAT WITH OR WITHOUT PRESENCE OF ULCERS (sore throat, sores in mouth, or a toothache) UNUSUAL RASH, SWELLING OR PAIN  UNUSUAL VAGINAL DISCHARGE OR ITCHING   Items with * indicate a potential emergency and should be followed up as soon as possible or go to the Emergency Department if any problems should occur.  Please show the CHEMOTHERAPY ALERT CARD or IMMUNOTHERAPY  ALERT CARD at check-in to the Emergency Department and triage nurse.  Should you have questions after your visit or need to cancel or reschedule your appointment, please contact [name] - A DEPT OF [facility]  [phone] and follow the prompts.  Office hours are 8:00 a.m. to 4:30 p.m. Monday - Friday. Please note that voicemails left after 4:00 p.m. may not be returned until the following business day.  We are closed weekends and major holidays. You have access to a nurse at all times for urgent questions. Please call the main number to the clinic [phone] and follow the prompts.  For any non-urgent questions, you may also contact your provider using MyChart. We now offer e-Visits for anyone 47 and older to request care online for non-urgent symptoms. For details visit [email or web address].   Also download the MyChart app! Go to the app store, search "MyChart", open the app, select [place], and log in with your MyChart username and password.

## 2023-06-11 ENCOUNTER — Encounter (HOSPITAL_COMMUNITY): Payer: Self-pay | Admitting: Internal Medicine

## 2023-06-12 ENCOUNTER — Inpatient Hospital Stay (HOSPITAL_BASED_OUTPATIENT_CLINIC_OR_DEPARTMENT_OTHER): Admitting: Internal Medicine

## 2023-06-12 ENCOUNTER — Inpatient Hospital Stay

## 2023-06-12 ENCOUNTER — Encounter: Payer: Self-pay | Admitting: Internal Medicine

## 2023-06-12 ENCOUNTER — Other Ambulatory Visit: Payer: Self-pay

## 2023-06-12 VITALS — BP 150/82 | HR 68

## 2023-06-12 VITALS — BP 157/85 | HR 73 | Temp 96.1°F | Resp 20 | Ht 70.0 in | Wt 212.9 lb

## 2023-06-12 DIAGNOSIS — C9001 Multiple myeloma in remission: Secondary | ICD-10-CM

## 2023-06-12 DIAGNOSIS — C9002 Multiple myeloma in relapse: Secondary | ICD-10-CM

## 2023-06-12 DIAGNOSIS — Z5111 Encounter for antineoplastic chemotherapy: Secondary | ICD-10-CM | POA: Diagnosis not present

## 2023-06-12 LAB — CBC WITH DIFFERENTIAL (CANCER CENTER ONLY)
Abs Immature Granulocytes: 0.03 10*3/uL (ref 0.00–0.07)
Basophils Absolute: 0 10*3/uL (ref 0.0–0.1)
Basophils Relative: 0 %
Eosinophils Absolute: 0.1 10*3/uL (ref 0.0–0.5)
Eosinophils Relative: 1 %
HCT: 42.2 % (ref 39.0–52.0)
Hemoglobin: 13.9 g/dL (ref 13.0–17.0)
Immature Granulocytes: 1 %
Lymphocytes Relative: 11 %
Lymphs Abs: 0.5 10*3/uL — ABNORMAL LOW (ref 0.7–4.0)
MCH: 30.6 pg (ref 26.0–34.0)
MCHC: 32.9 g/dL (ref 30.0–36.0)
MCV: 93 fL (ref 80.0–100.0)
Monocytes Absolute: 0.8 10*3/uL (ref 0.1–1.0)
Monocytes Relative: 20 %
Neutro Abs: 2.7 10*3/uL (ref 1.7–7.7)
Neutrophils Relative %: 67 %
Platelet Count: 175 10*3/uL (ref 150–400)
RBC: 4.54 MIL/uL (ref 4.22–5.81)
RDW: 15.2 % (ref 11.5–15.5)
WBC Count: 4.1 10*3/uL (ref 4.0–10.5)
nRBC: 0 % (ref 0.0–0.2)

## 2023-06-12 LAB — CMP (CANCER CENTER ONLY)
ALT: 28 U/L (ref 0–44)
AST: 19 U/L (ref 15–41)
Albumin: 3.6 g/dL (ref 3.5–5.0)
Alkaline Phosphatase: 76 U/L (ref 38–126)
Anion gap: 9 (ref 5–15)
BUN: 21 mg/dL (ref 8–23)
CO2: 26 mmol/L (ref 22–32)
Calcium: 9 mg/dL (ref 8.9–10.3)
Chloride: 103 mmol/L (ref 98–111)
Creatinine: 1.19 mg/dL (ref 0.61–1.24)
GFR, Estimated: >60 mL/min (ref >60)
Glucose, Bld: 156 mg/dL — ABNORMAL HIGH (ref 70–99)
Potassium: 4.5 mmol/L (ref 3.5–5.1)
Sodium: 138 mmol/L (ref 135–145)
Total Bilirubin: 0.6 mg/dL (ref 0.0–1.2)
Total Protein: 7.6 g/dL (ref 6.5–8.1)

## 2023-06-12 MED ORDER — ACETAMINOPHEN 325 MG PO TABS
650.0000 mg | ORAL_TABLET | Freq: Once | ORAL | Status: AC
Start: 2023-06-12 — End: 2023-06-12
  Administered 2023-06-12: 650 mg via ORAL
  Filled 2023-06-12: qty 2

## 2023-06-12 MED ORDER — MONTELUKAST SODIUM 10 MG PO TABS
10.0000 mg | ORAL_TABLET | Freq: Once | ORAL | Status: AC
  Administered 2023-06-12: 10 mg via ORAL
  Filled 2023-06-12: qty 1

## 2023-06-12 MED ORDER — DIPHENHYDRAMINE HCL 25 MG PO CAPS
50.0000 mg | ORAL_CAPSULE | Freq: Once | ORAL | Status: AC
  Administered 2023-06-12: 50 mg via ORAL
  Filled 2023-06-12: qty 2

## 2023-06-12 MED ORDER — BORTEZOMIB CHEMO SQ INJECTION 3.5 MG (2.5MG/ML)
1.3000 mg/m2 | Freq: Once | INTRAMUSCULAR | Status: AC
  Administered 2023-06-12: 2.75 mg via SUBCUTANEOUS
  Filled 2023-06-12: qty 1.1

## 2023-06-12 MED ORDER — DEXAMETHASONE 4 MG PO TABS
20.0000 mg | ORAL_TABLET | Freq: Once | ORAL | Status: AC
  Administered 2023-06-12: 20 mg via ORAL
  Filled 2023-06-12: qty 5

## 2023-06-12 MED ORDER — ZOLEDRONIC ACID 4 MG/5ML IV CONC
3.0000 mg | Freq: Once | INTRAVENOUS | Status: AC
  Administered 2023-06-12: 3 mg via INTRAVENOUS
  Filled 2023-06-12: qty 3.75

## 2023-06-12 MED ORDER — SODIUM CHLORIDE 0.9 % IV SOLN
INTRAVENOUS | Status: DC | PRN
  Filled 2023-06-12: qty 250

## 2023-06-12 MED ORDER — DARATUMUMAB-HYALURONIDASE-FIHJ 1800-30000 MG-UT/15ML ~~LOC~~ SOLN
1800.0000 mg | Freq: Once | SUBCUTANEOUS | Status: AC
  Administered 2023-06-12: 1800 mg via SUBCUTANEOUS
  Filled 2023-06-12: qty 15

## 2023-06-12 NOTE — Progress Notes (Signed)
 Nutrition Assessment   Reason for Assessment:   Patient identified on Malnutrition Screening tool   ASSESSMENT:  78 year old male with multiple myeloma.  Patient medical history of DM, GERD, HLD, HTN, thyroid cancer.  Receiving dara, velcade , dexamethasone .  Met with patient during infusion.  Reports that appetite has improved. Appetite was decreased for a few weeks but steroids have helped it.  Usually eats breakfast and supper meals.  Does not drink oral nutrition supplements shakes.  Drinks [employer] and water.  Says that constipation is better.  Denies nausea.    Medications: Vit B 12, glipizide, actos , Kdur, Vit D, zofran    Labs: glucose 156   Anthropometrics:   Height: 70 inches Weight: 212 lb 14.4 oz on 5/6 240 lb on 02/12/23 BMI: 30  12% weight loss in the 4 months   Estimated Energy Needs  Kcals: 2400-2800 Protein: 120-140 g Fluid: 2400-2800   NUTRITION DIAGNOSIS: Inadequate oral intake related to cancer and related treatment side effects as evidenced by 12% weight loss and decreased appetite for few weeks but now improving    INTERVENTION:  Discussed importance of adequate calories and protein Encouraged protein rich foods at every meal If weight continues to decline would recommend oral nutrition supplement  Contact information provided   MONITORING, EVALUATION, GOAL: weight trends, intake   Next Visit: Friday, May 30 during infusion  Keilana Morlock B. Zollie Hipp, CSO, LDN Registered Dietitian [phone]

## 2023-06-12 NOTE — Assessment & Plan Note (Addendum)
#  Multiple myeloma-most recently on maintenance Revlimid  15 mg 2  weeks on 2 week off.  JAN 2025-  M-protein- 1.3[April 2023- NEG]; kappa=113 lambda light chain ratio=2.5 [July 2020- 2.0]. RISING- ca- 10.4. # APRIL 2025- . Diffuse lytic myelomatous lesions throughout the skeleton. Most of these lesions demonstrate hypermetabolism; Healing left mid clavicle fracture, likely pathologic; Early nondisplaced pathological fracture involving the right mid clavicle; Left pubic bone lesion has a defect in the outer cortex and this could be a pending pathologic fracture; . No findings for extraskeletal myeloma. Bone marrow Bx- on APRIL 25th, 2025- Pending results.  April 2025 discontinue lenalidomide . On Dara Velcade  dexamethasone .    # proceed with Dara Velcade  dexamethasone .  cycle # 1 day 8 today. Labs-CBC/chemistries were reviewed with the patient.   .  However will need to consider consolidation therapy.  June 2nd, 2025- awaiting [facility] consideration re:CAR-T therapy/stem cell.   # Hyperkalemia/hypokalemia- 3.4 -on Kdur 2 pills a day-   # acute back pain: Progressive bone lesions on PET scan April 2025-status post Zometa  improved with tramadol .   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 stable. April 2024- Vit D 38- recommend Vit D 50,K/weekly  # DM- on OHA - FBG- 119-recommend close monitoring of blood glucose levels. Recommend bringing log at this time.   # infection prophylaxis: Continue  acyclovir  twice a day.  Prescription sent.  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  However patient is currently doing well without any evidence of progression.  Patient interested in continue current scope of therapy; and full code.   ; velcade  twice weekly- Dara-Velcade  q Weekly x 4 cycles- starting # 5-8 dara q 3W; cycles 9+- dara q 28 days  valcade;  Zometa  q 6 M-last 06/12/2023   PS;  Disposition: # have lab ADD beta-2 microglobulin # chemo today; Zometa   today; cancel this week treatment. [Pt pref] # follow up in 1 week- MD: labs- cbc/cmp; chemo;  - d-4 chemo # follow up in 2 week- APP- labs- cbc/cmp; chemo; d-4 chemod #  follow up in 3 week- MD: labs- cbc/cmp; chemo;  #  follow up in 4 week- MD: labs- cbc/cmp; MM panel. K/l light chains-chemo; -4 chemo-Dr.B

## 2023-06-12 NOTE — Progress Notes (Signed)
 Pt asking to change next weeks appt from Friday to Thursday due to going out of town.

## 2023-06-12 NOTE — Progress Notes (Signed)
 [facility] OFFICE PROGRESS NOTE  Patient Care Team: [name], MD as PCP - General (Internal Medicine) Eduardo Grade, MD as Consulting Physician (Dermatology) [name], MD as Consulting Physician (Hematology and Oncology)   Cancer Staging  Multiple myeloma in relapse [facility]) Staging form: Plasma Cell Myeloma and Plasma Cell Disorders, AJCC 8th Edition - Clinical: No stage assigned - Unsigned - Clinical: No stage assigned - Unsigned    Oncology History Overview Note   # April 2014- MULTIPLE MYELOMA [IgG- 2.2gm/dl; 16-10% plasma cell; Cyto-N; FISH- gain of chr.9, 15 & ccnd1/11q13] s/p RVD x6 [sep 2014; BMBx- ~5% plasma cells]; Maint Rev-DEX-Zometa ; March 2015-Stopped Dex Cont- Rev-Zometa ; July 2016- Rev 25mg ; NOV 4th  2016-HOLD; BMT eval at [facility] [Dr.Woods]; declined BMT.   # Re-START FEB 2017 Rev 15mg  3 W- on & 1 w OFF; HELD Rev June- Oct 2020- sec to worsening renal function [Dr.Singh]; SEP 2021- Rev 15 mg 2 w-On & 2 w-OFF. STOP REVLIMD in APRIL 2025-progression.    # APRIL 2025- . Diffuse lytic myelomatous lesions throughout the skeleton. Most of these lesions demonstrate hypermetabolism; Healing left mid clavicle fracture, likely pathologic; Early nondisplaced pathological fracture involving the right mid clavicle; Left pubic bone lesion has a defect in the outer cortex and this could be a pending pathologic fracture; . No findings for extraskeletal myeloma. Bone marrow Bx- on APRIL 25th, 2025- Pending.  # April 2025 discontinue lenalidomide .  # APRIL 29th, 2025- start Dara-Velcade  -Dex   # Zometa   # CKD [creat ~1.4; sec MM; Dr.Singh]; DM  -------------------------------------------------------------------------   DIAGNOSIS: [name] ] MULTIPLE MYELOMA  GOALS: control     Multiple myeloma in remission (HCC)  10/01/2015 Initial Diagnosis   Multiple myeloma in remission (HCC)   Multiple myeloma in relapse (HCC)  05/14/2023 Initial Diagnosis    Multiple myeloma in relapse (HCC)   06/05/2023 -  Chemotherapy   Patient is on Treatment Plan : MYELOMA RELAPSED / REFRACTORY Daratumumab  SQ + Bortezomib  + Dexamethasone  (DaraVd) q21d / Daratumumab  SQ q28d       INTERVAL HISTORY: pt is  with wife.  Ambulating independently.   Alan Alexander 78 y.o.  male pleasant patient above history of recurrent multiple myeloma  is here for follow-up/ and review the results of the bone marrow biopsy and to proceed with chemotherapy.   Patient received Zometa  approximately 4 weeks ago.  Patient also started on tramadol  1 pills every 6-8 hours.  Noted to have improvement of his hip pain.  Denies pain. Sleeps good. Denies weakness. Denies any difficulties or concerns today.   Patient denies any skin rash.  Denies any diarrhea.  No swelling in the legs.  No nausea vomiting.   Review of Systems  Constitutional:  Negative for chills, diaphoresis, fever, malaise/fatigue and weight loss.  HENT:  Negative for nosebleeds and sore throat.   Eyes:  Negative for double vision.  Respiratory:  Negative for cough, hemoptysis, sputum production, shortness of breath and wheezing.   Cardiovascular:  Positive for leg swelling. Negative for chest pain, palpitations and orthopnea.  Gastrointestinal:  Negative for abdominal pain, blood in stool, constipation, diarrhea, heartburn, melena, nausea and vomiting.  Genitourinary:  Negative for dysuria, frequency and urgency.  Musculoskeletal:  Positive for back pain and joint pain.  Skin: Negative.  Negative for itching and rash.  Neurological:  Negative for dizziness, tingling, focal weakness, weakness and headaches.  Endo/Heme/Allergies:  Does not bruise/bleed easily.  Psychiatric/Behavioral:  Negative for depression. The patient is  not nervous/anxious and does not have insomnia.       PAST MEDICAL HISTORY :  Past Medical History:  Diagnosis Date   Diabetes mellitus without complication (HCC)    GERD (gastroesophageal  reflux disease)    Hyperlipidemia    Hypertension    Multiple myeloma (HCC)    Obesity    Thyroid cancer (HCC)    Thyroid disease     PAST SURGICAL HISTORY :   Past Surgical History:  Procedure Laterality Date   BONE MARROW BIOPSY  2014   CATARACT EXTRACTION BILATERAL W/ ANTERIOR VITRECTOMY     IR BONE MARROW BIOPSY & ASPIRATION  06/01/2023    FAMILY HISTORY :   Family History  Problem Relation Age of Onset   Cancer Father 20   Diabetes Mother     SOCIAL HISTORY:   Social History   Tobacco Use   Smoking status: Never   Smokeless tobacco: Never  Substance Use Topics   Alcohol use: No   Drug use: No    ALLERGIES:  has no known allergies.  MEDICATIONS:  Current Outpatient Medications  Medication Sig Dispense Refill   acyclovir  (ZOVIRAX ) 400 MG tablet Take 1 tablet (400 mg total) by mouth 2 (two) times daily. 120 tablet 2   amLODipine  (NORVASC ) 10 MG tablet Take 10 mg by mouth daily.     aspirin EC 81 MG tablet Take 81 mg by mouth daily.     atorvastatin  (LIPITOR) 20 MG tablet Take 20 mg by mouth daily.     cloNIDine  (CATAPRES ) 0.1 MG tablet Take 0.1 mg by mouth 2 (two) times daily.     Cyanocobalamin 1000 MCG TBCR Take by mouth.     cyclobenzaprine  (FLEXERIL ) 5 MG tablet Take 1 tablet (5 mg total) by mouth 3 (three) times daily as needed. 15 tablet 0   dapagliflozin propanediol (FARXIGA) 10 MG TABS tablet TAKE 10 MG BY MOUTH 1 (ONE) TIME EACH DAY IN THE MORNING     ergocalciferol  (VITAMIN D2) 1.25 MG (50000 UT) capsule Take 1 capsule (50,000 Units total) by mouth once a week. 12 capsule 1   glipiZIDE (GLUCOTROL) 5 MG tablet Take 5 mg by mouth every morning.     levothyroxine  (SYNTHROID ) 137 MCG tablet Take 1 tablet by mouth daily.      lisinopril (ZESTRIL) 20 MG tablet Take 20 mg by mouth daily.     naproxen  (NAPROSYN ) 500 MG tablet Take 1 tablet (500 mg total) by mouth 2 (two) times daily with a meal. 20 tablet 2   ONETOUCH DELICA LANCETS 33G MISC Inject 1 Lancet as  directed once daily.     pioglitazone  (ACTOS ) 30 MG tablet Take 30 mg by mouth daily.     potassium chloride  SA (K-DUR,KLOR-CON ) 20 MEQ tablet Take 20 mEq by mouth 2 (two) times daily.   0   quinapril  (ACCUPRIL ) 40 MG tablet Take 40 mg by mouth daily.     torsemide  (DEMADEX ) 20 MG tablet Take 20 mg by mouth daily as needed.     traMADol  (ULTRAM ) 50 MG tablet Take 1 tablet (50 mg total) by mouth every 6 (six) hours as needed for moderate pain (pain score 4-6). 45 tablet 0   ondansetron  (ZOFRAN ) 8 MG tablet One pill every 8 hours as needed for nausea/vomitting. (Patient not taking: Reported on 06/12/2023) 40 tablet 1   prochlorperazine  (COMPAZINE ) 10 MG tablet Take 1 tablet (10 mg total) by mouth every 6 (six) hours as needed for nausea or vomiting. (  Patient not taking: Reported on 06/12/2023) 40 tablet 1   No current facility-administered medications for this visit.   Facility-Administered Medications Ordered in Other Visits  Medication Dose Route Frequency Provider Last Rate Last Admin   0.9 %  sodium chloride  infusion   Intravenous PRN Marcheta Horsey R, MD 10 mL/hr at 06/12/23 0946 New Bag at 06/12/23 0946   bortezomib  SQ (VELCADE ) chemo injection (2.5mg /mL concentration) 2.75 mg  1.3 mg/m2 (Treatment Plan Recorded) Subcutaneous Once Cristoval Teall R, MD       daratumumab -hyaluronidase -fihj (DARZALEX  FASPRO) 1800-30000 MG-UT/15ML chemo SQ injection 1,800 mg  1,800 mg Subcutaneous Once Vinh Sachs R, MD       zoledronic  acid (ZOMETA ) 3 mg in sodium chloride  0.9 % 100 mL IVPB  3 mg Intravenous Once Pippa Hanif R, MD 415 mL/hr at 06/12/23 0947 3 mg at 06/12/23 0947    PHYSICAL EXAMINATION: ECOG PERFORMANCE STATUS: 0 - Asymptomatic  BP (!) 157/85 (BP Location: Left Arm, Patient Position: Sitting, Cuff Size: Large)   Pulse 73   Temp (!) 96.1 F (35.6 C) (Tympanic)   Resp 20   Ht 5\' 10"  (1.778 m)   Wt 212 lb 14.4 oz (96.6 kg)   SpO2 96%   BMI 30.55 kg/m   Filed  Weights   06/12/23 0820  Weight: 212 lb 14.4 oz (96.6 kg)      Physical Exam Vitals and nursing note reviewed.  HENT:     Head: Normocephalic and atraumatic.     Mouth/Throat:     Pharynx: Oropharynx is clear. No oropharyngeal exudate.  Eyes:     Extraocular Movements: Extraocular movements intact.     Pupils: Pupils are equal, round, and reactive to light.  Cardiovascular:     Rate and Rhythm: Normal rate and regular rhythm.  Pulmonary:     Effort: No respiratory distress.     Breath sounds: No wheezing.     Comments: Decreased breath sounds bilaterally.  Abdominal:     General: Bowel sounds are normal. There is no distension.     Palpations: Abdomen is soft. There is no mass.     Tenderness: There is no abdominal tenderness. There is no guarding or rebound.  Musculoskeletal:        General: No tenderness. Normal range of motion.     Cervical back: Normal range of motion and neck supple.  Skin:    General: Skin is warm.  Neurological:     General: No focal deficit present.     Mental Status: He is alert and oriented to person, place, and time.  Psychiatric:        Mood and Affect: Affect normal.        Behavior: Behavior normal.        Judgment: Judgment normal.       LABORATORY DATA:  I have reviewed the data as listed    Component Value Date/Time   NA 138 06/12/2023 0816   NA 139 05/01/2014 1322   K 4.5 06/12/2023 0816   K 4.4 05/01/2014 1322   CL 103 06/12/2023 0816   CL 107 05/01/2014 1322   CO2 26 06/12/2023 0816   CO2 27 05/01/2014 1322   GLUCOSE 156 (H) 06/12/2023 0816   GLUCOSE 102 (H) 05/01/2014 1322   BUN 21 06/12/2023 0816   BUN 16 05/01/2014 1322   CREATININE 1.19 06/12/2023 0816   CREATININE 1.48 (H) 05/29/2014 0949   CALCIUM 9.0 06/12/2023 0816   CALCIUM 9.1 05/01/2014 1322  PROT 7.6 06/12/2023 0816   PROT 7.5 05/01/2014 1322   ALBUMIN 3.6 06/12/2023 0816   ALBUMIN 3.6 05/01/2014 1322   AST 19 06/12/2023 0816   ALT 28 06/12/2023 0816    ALT 13 (L) 05/01/2014 1322   ALKPHOS 76 06/12/2023 0816   ALKPHOS 47 05/01/2014 1322   BILITOT 0.6 06/12/2023 0816   GFRNONAA >60 06/12/2023 0816   GFRNONAA 47 (L) 05/29/2014 0949   GFRAA 47 (L) 10/21/2019 1106   GFRAA 55 (L) 05/29/2014 0949    No results found for: "SPEP", "UPEP"  Lab Results  Component Value Date   WBC 4.1 06/12/2023   NEUTROABS 2.7 06/12/2023   HGB 13.9 06/12/2023   HCT 42.2 06/12/2023   MCV 93.0 06/12/2023   PLT 175 06/12/2023      Chemistry      Component Value Date/Time   NA 138 06/12/2023 0816   NA 139 05/01/2014 1322   K 4.5 06/12/2023 0816   K 4.4 05/01/2014 1322   CL 103 06/12/2023 0816   CL 107 05/01/2014 1322   CO2 26 06/12/2023 0816   CO2 27 05/01/2014 1322   BUN 21 06/12/2023 0816   BUN 16 05/01/2014 1322   CREATININE 1.19 06/12/2023 0816   CREATININE 1.48 (H) 05/29/2014 0949      Component Value Date/Time   CALCIUM 9.0 06/12/2023 0816   CALCIUM 9.1 05/01/2014 1322   ALKPHOS 76 06/12/2023 0816   ALKPHOS 47 05/01/2014 1322   AST 19 06/12/2023 0816   ALT 28 06/12/2023 0816   ALT 13 (L) 05/01/2014 1322   BILITOT 0.6 06/12/2023 0816       RADIOGRAPHIC STUDIES: I have personally reviewed the radiological images as listed and agreed with the findings in the report. No results found.   ASSESSMENT & PLAN:  Multiple myeloma in relapse (HCC) #Multiple myeloma-most recently on maintenance Revlimid  15 mg 2  weeks on 2 week off.  JAN 2025-  M-protein- 1.3[April 2023- NEG]; kappa=113 lambda light chain ratio=2.5 [July 2020- 2.0]. RISING- ca- 10.4. # APRIL 2025- . Diffuse lytic myelomatous lesions throughout the skeleton. Most of these lesions demonstrate hypermetabolism; Healing left mid clavicle fracture, likely pathologic; Early nondisplaced pathological fracture involving the right mid clavicle; Left pubic bone lesion has a defect in the outer cortex and this could be a pending pathologic fracture; . No findings for extraskeletal  myeloma. Bone marrow Bx- on APRIL 25th, 2025- Pending results.  April 2025 discontinue lenalidomide . On Dara Velcade  dexamethasone .    # proceed with Dara Velcade  dexamethasone .  cycle # 1 day 8 today. Labs-CBC/chemistries were reviewed with the patient.   .  However will need to consider consolidation therapy.  June 2nd, 2025- awaiting [facility] consideration re:CAR-T therapy/stem cell.   # Hyperkalemia/hypokalemia- 3.4 -on Kdur 2 pills a day-   # acute back pain: Progressive bone lesions on PET scan April 2025-status post Zometa  improved with tramadol .   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 stable. April 2024- Vit D 38- recommend Vit D 50,K/weekly  # DM- on OHA - FBG- 119-recommend close monitoring of blood glucose levels. Recommend bringing log at this time.   # infection prophylaxis: Continue  acyclovir  twice a day.  Prescription sent.  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  However patient is currently doing well without any evidence of progression.  Patient interested in continue current scope of therapy; and full code.   ; velcade  twice weekly- Dara-Velcade   q Weekly x 4 cycles- starting # 5-8 dara q 3W; cycles 9+- dara q 28 days  valcade;  Zometa  q 6 M-last 06/12/2023   PS;  Disposition: # have lab ADD beta-2 microglobulin # chemo today; Zometa  today; cancel this week treatment. [Pt pref] # follow up in 1 week- MD: labs- cbc/cmp; chemo;  - d-4 chemo # follow up in 2 week- APP- labs- cbc/cmp; chemo; d-4 chemod #  follow up in 3 week- MD: labs- cbc/cmp; chemo;  #  follow up in 4 week- MD: labs- cbc/cmp; MM panel. K/l light chains-chemo; -4 chemo-Dr.B        Orders Placed This Encounter  Procedures   CBC with Differential (Cancer Center Only)    Standing Status:   Future    Expected Date:   07/03/2023    Expiration Date:   07/02/2024   CMP (Cancer Center only)    Standing Status:   Future    Expected Date:   07/03/2023     Expiration Date:   07/02/2024   CBC with Differential (Cancer Center Only)    Standing Status:   Future    Expected Date:   07/10/2023    Expiration Date:   07/09/2024   Beta 2 microglobulin, serum    Standing Status:   Future    Number of Occurrences:   1    Expected Date:   06/12/2023    Expiration Date:   06/11/2024   Kappa/lambda light chains    Standing Status:   Future    Expected Date:   07/10/2023    Expiration Date:   06/11/2024   Multiple Myeloma Panel (SPEP&IFE w/QIG)    Standing Status:   Future    Expected Date:   07/10/2023    Expiration Date:   06/11/2024    All questions were answered. The patient knows to call the clinic with any problems, questions or concerns.      [name], MD 06/12/2023 9:49 AM

## 2023-06-13 ENCOUNTER — Other Ambulatory Visit: Payer: Self-pay

## 2023-06-13 LAB — BETA 2 MICROGLOBULIN, SERUM: Beta-2 Microglobulin: 2.4 mg/L (ref 0.6–2.4)

## 2023-06-15 ENCOUNTER — Other Ambulatory Visit: Payer: Self-pay

## 2023-06-15 ENCOUNTER — Ambulatory Visit

## 2023-06-19 ENCOUNTER — Inpatient Hospital Stay

## 2023-06-19 ENCOUNTER — Other Ambulatory Visit: Payer: Self-pay | Admitting: *Deleted

## 2023-06-19 ENCOUNTER — Inpatient Hospital Stay (HOSPITAL_BASED_OUTPATIENT_CLINIC_OR_DEPARTMENT_OTHER): Admitting: Internal Medicine

## 2023-06-19 ENCOUNTER — Encounter: Payer: Self-pay | Admitting: Internal Medicine

## 2023-06-19 VITALS — BP 119/74 | HR 99 | Temp 97.3°F | Ht 70.0 in | Wt 206.4 lb

## 2023-06-19 DIAGNOSIS — C9002 Multiple myeloma in relapse: Secondary | ICD-10-CM | POA: Diagnosis not present

## 2023-06-19 DIAGNOSIS — Z5111 Encounter for antineoplastic chemotherapy: Secondary | ICD-10-CM | POA: Diagnosis not present

## 2023-06-19 LAB — CBC WITH DIFFERENTIAL (CANCER CENTER ONLY)
Abs Immature Granulocytes: 0.05 10*3/uL (ref 0.00–0.07)
Basophils Absolute: 0.1 10*3/uL (ref 0.0–0.1)
Basophils Relative: 1 %
Eosinophils Absolute: 0 10*3/uL (ref 0.0–0.5)
Eosinophils Relative: 0 %
HCT: 42.1 % (ref 39.0–52.0)
Hemoglobin: 13.9 g/dL (ref 13.0–17.0)
Immature Granulocytes: 1 %
Lymphocytes Relative: 3 %
Lymphs Abs: 0.2 10*3/uL — ABNORMAL LOW (ref 0.7–4.0)
MCH: 30.5 pg (ref 26.0–34.0)
MCHC: 33 g/dL (ref 30.0–36.0)
MCV: 92.5 fL (ref 80.0–100.0)
Monocytes Absolute: 1.2 10*3/uL — ABNORMAL HIGH (ref 0.1–1.0)
Monocytes Relative: 15 %
Neutro Abs: 6.7 10*3/uL (ref 1.7–7.7)
Neutrophils Relative %: 80 %
Platelet Count: 217 10*3/uL (ref 150–400)
RBC: 4.55 MIL/uL (ref 4.22–5.81)
RDW: 15.9 % — ABNORMAL HIGH (ref 11.5–15.5)
WBC Count: 8.3 10*3/uL (ref 4.0–10.5)
nRBC: 0 % (ref 0.0–0.2)

## 2023-06-19 LAB — CMP (CANCER CENTER ONLY)
ALT: 17 U/L (ref 0–44)
AST: 14 U/L — ABNORMAL LOW (ref 15–41)
Albumin: 3.2 g/dL — ABNORMAL LOW (ref 3.5–5.0)
Alkaline Phosphatase: 69 U/L (ref 38–126)
Anion gap: 17 — ABNORMAL HIGH (ref 5–15)
BUN: 52 mg/dL — ABNORMAL HIGH (ref 8–23)
CO2: 23 mmol/L (ref 22–32)
Calcium: 7.7 mg/dL — ABNORMAL LOW (ref 8.9–10.3)
Chloride: 98 mmol/L (ref 98–111)
Creatinine: 1.77 mg/dL — ABNORMAL HIGH (ref 0.61–1.24)
GFR, Estimated: 39 mL/min — ABNORMAL LOW (ref >60)
Glucose, Bld: 146 mg/dL — ABNORMAL HIGH (ref 70–99)
Potassium: 4.6 mmol/L (ref 3.5–5.1)
Sodium: 138 mmol/L (ref 135–145)
Total Bilirubin: 1.5 mg/dL — ABNORMAL HIGH (ref 0.0–1.2)
Total Protein: 6.7 g/dL (ref 6.5–8.1)

## 2023-06-19 MED ORDER — DIPHENHYDRAMINE HCL 25 MG PO CAPS
50.0000 mg | ORAL_CAPSULE | Freq: Once | ORAL | Status: AC
  Administered 2023-06-19: 50 mg via ORAL
  Filled 2023-06-19: qty 2

## 2023-06-19 MED ORDER — DARATUMUMAB-HYALURONIDASE-FIHJ 1800-30000 MG-UT/15ML ~~LOC~~ SOLN
1800.0000 mg | Freq: Once | SUBCUTANEOUS | Status: AC
  Administered 2023-06-19: 1800 mg via SUBCUTANEOUS
  Filled 2023-06-19: qty 15

## 2023-06-19 MED ORDER — ACETAMINOPHEN 325 MG PO TABS
650.0000 mg | ORAL_TABLET | Freq: Once | ORAL | Status: AC
  Administered 2023-06-19: 650 mg via ORAL
  Filled 2023-06-19: qty 2

## 2023-06-19 MED ORDER — MONTELUKAST SODIUM 10 MG PO TABS
10.0000 mg | ORAL_TABLET | Freq: Once | ORAL | Status: AC
  Administered 2023-06-19: 10 mg via ORAL
  Filled 2023-06-19: qty 1

## 2023-06-19 MED ORDER — DEXAMETHASONE 4 MG PO TABS
20.0000 mg | ORAL_TABLET | Freq: Once | ORAL | Status: AC
  Administered 2023-06-19: 20 mg via ORAL
  Filled 2023-06-19: qty 5

## 2023-06-19 MED ORDER — CHLORPROMAZINE HCL 25 MG PO TABS: 25.0000 mg | ORAL_TABLET | Freq: Three times a day (TID) | ORAL | 1 refills | Status: AC

## 2023-06-19 NOTE — Patient Instructions (Signed)
 [name] - A DEPT OF [facility]  Discharge Instructions: Thank you for choosing [facility] to provide your oncology and hematology care.  If you have a lab appointment with the Cancer Center, please go directly to the Cancer Center and check in at the registration area.  Wear comfortable clothing and clothing appropriate for easy access to any Portacath or PICC line.   We strive to give you quality time with your provider. You may need to reschedule your appointment if you arrive late (15 or more minutes).  Arriving late affects you and other patients whose appointments are after yours.  Also, if you miss three or more appointments without notifying the office, you may be dismissed from the clinic at the provider's discretion.      For prescription refill requests, have your pharmacy contact our office and allow 72 hours for refills to be completed.    Today you received the following chemotherapy and/or immunotherapy agents DARZALEX      To help prevent nausea and vomiting after your treatment, we encourage you to take your nausea medication as directed.  BELOW ARE SYMPTOMS THAT SHOULD BE REPORTED IMMEDIATELY: *FEVER GREATER THAN 100.4 F (38 C) OR HIGHER *CHILLS OR SWEATING *NAUSEA AND VOMITING THAT IS NOT CONTROLLED WITH YOUR NAUSEA MEDICATION *UNUSUAL SHORTNESS OF BREATH *UNUSUAL BRUISING OR BLEEDING *URINARY PROBLEMS (pain or burning when urinating, or frequent urination) *BOWEL PROBLEMS (unusual diarrhea, constipation, pain near the anus) TENDERNESS IN MOUTH AND THROAT WITH OR WITHOUT PRESENCE OF ULCERS (sore throat, sores in mouth, or a toothache) UNUSUAL RASH, SWELLING OR PAIN  UNUSUAL VAGINAL DISCHARGE OR ITCHING   Items with * indicate a potential emergency and should be followed up as soon as possible or go to the Emergency Department if any problems should occur.  Please show the CHEMOTHERAPY ALERT CARD or IMMUNOTHERAPY  ALERT CARD at check-in to the Emergency Department and triage nurse.  Should you have questions after your visit or need to cancel or reschedule your appointment, please contact [name] - A DEPT OF [facility]  [phone] and follow the prompts.  Office hours are 8:00 a.m. to 4:30 p.m. Monday - Friday. Please note that voicemails left after 4:00 p.m. may not be returned until the following business day.  We are closed weekends and major holidays. You have access to a nurse at all times for urgent questions. Please call the main number to the clinic [phone] and follow the prompts.  For any non-urgent questions, you may also contact your provider using MyChart. We now offer e-Visits for anyone 18 and older to request care online for non-urgent symptoms. For details visit [email or web address].   Also download the MyChart app! Go to the app store, search "MyChart", open the app, select , and log in with your MyChart username and password.  Daratumumab; Hyaluronidase Injection What is this medication? DARATUMUMAB; HYALURONIDASE (dar a toom ue mab; hye al ur ON i dase) treats multiple myeloma, a type of bone marrow cancer. Daratumumab works by blocking a protein that causes cancer cells to grow and multiply. This helps to slow or stop the spread of cancer cells. Hyaluronidase works by increasing the absorption of other medications in the body to help them work better. This medication may also be used treat amyloidosis, a condition that causes the buildup of a protein (amyloid) in your body. It works by reducing the buildup  of this protein, which decreases symptoms. It is a combination medication that contains a monoclonal antibody. This medicine may be used for other purposes; ask your health care provider or pharmacist if you have questions. COMMON BRAND NAME(S): DARZALEX FASPRO What should I tell my care team before I take this medication? They  need to know if you have any of these conditions: Heart disease Infection, such as chickenpox, cold sores, herpes, hepatitis B Lung or breathing disease An unusual or allergic reaction to daratumumab, hyaluronidase, other medications, foods, dyes, or preservatives Pregnant or trying to get pregnant Breast-feeding How should I use this medication? This medication is injected under the skin. It is given by your care team in a hospital or clinic setting. Talk to your care team about the use of this medication in children. Special care may be needed. Overdosage: If you think you have taken too much of this medicine contact a poison control center or emergency room at once. NOTE: This medicine is only for you. Do not share this medicine with others. What if I miss a dose? Keep appointments for follow-up doses. It is important not to miss your dose. Call your care team if you are unable to keep an appointment. What may interact with this medication? Interactions have not been studied. This list may not describe all possible interactions. Give your health care provider a list of all the medicines, herbs, non-prescription drugs, or dietary supplements you use. Also tell them if you smoke, drink alcohol, or use illegal drugs. Some items may interact with your medicine. What should I watch for while using this medication? Your condition will be monitored carefully while you are receiving this medication. This medication can cause serious allergic reactions. To reduce your risk, your care team may give you other medication to take before receiving this one. Be sure to follow the directions from your care team. This medication can affect the results of blood tests to match your blood type. These changes can last for up to 6 months after the final dose. Your care team will do blood tests to match your blood type before you start treatment. Tell all of your care team that you are being treated with this  medication before receiving a blood transfusion. This medication can affect the results of some tests used to determine treatment response; extra tests may be needed to evaluate response. Talk to your care team if you wish to become pregnant or think you are pregnant. This medication can cause serious birth defects if taken during pregnancy and for 3 months after the last dose. A reliable form of contraception is recommended while taking this medication and for 3 months after the last dose. Talk to your care team about effective forms of contraception. Do not breast-feed while taking this medication. What side effects may I notice from receiving this medication? Side effects that you should report to your care team as soon as possible: Allergic reactions--skin rash, itching, hives, swelling of the face, lips, tongue, or throat Heart rhythm changes--fast or irregular heartbeat, dizziness, feeling faint or lightheaded, chest pain, trouble breathing Infection--fever, chills, cough, sore throat, wounds that don't heal, pain or trouble when passing urine, general feeling of discomfort or being unwell Infusion reactions--chest pain, shortness of breath or trouble breathing, feeling faint or lightheaded Sudden eye pain or change in vision such as blurry vision, seeing halos around lights, vision loss Unusual bruising or bleeding Side effects that usually do not require medical attention (report to your  care team if they continue or are bothersome): Constipation Diarrhea Fatigue Nausea Pain, tingling, or numbness in the hands or feet Swelling of the ankles, hands, or feet This list may not describe all possible side effects. Call your doctor for medical advice about side effects. You may report side effects to FDA at 1-800-FDA-1088. Where should I keep my medication? This medication is given in a hospital or clinic. It will not be stored at home. NOTE: This sheet is a summary. It may not cover all  possible information. If you have questions about this medicine, talk to your doctor, pharmacist, or health care provider.  2024 Elsevier/Gold Standard (2021-05-31 00:00:00)

## 2023-06-19 NOTE — Assessment & Plan Note (Addendum)
#  Multiple myeloma-most recently on maintenance Revlimid  15 mg 2  weeks on 2 week off.  JAN 2025-  M-protein- 1.3[April 2023- NEG]; kappa=113 lambda light chain ratio=2.5 [July 2020- 2.0]. RISING- ca- 10.4. # APRIL 2025- . Diffuse lytic myelomatous lesions throughout the skeleton. Most of these lesions demonstrate hypermetabolism; Healing left mid clavicle fracture, likely pathologic; Early nondisplaced pathological fracture involving the right mid clavicle; Left pubic bone lesion has a defect in the outer cortex and this could be a pending pathologic fracture; . No findings for extraskeletal myeloma. Bone marrow Bx- on APRIL 25th, 2025- Pending results.  April 2025 discontinue lenalidomide . On Dara Velcade  dexamethasone .    # proceed with Dara Velcade  dexamethasone .  cycle # 1 day 15 today. Labs-CBC/chemistries were reviewed with the patient. However will need to consider consolidation therapy.  June 2nd, 2025- awaiting [facility] consideration re:CAR-T therapy/stem cell.   # GI: nausea and vomiting; and diarrhea 2-3 days/hiccups/weight loss: Recommend ensure/boost/glucerna. Send scipt for thorazine- Cut Dex dose-will plan weekly total of 20 mg of dexamethasone .  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 Aaron Aas April 2024- Vit D 38- recommend Vit D 50,K/weekly- HOLD demadex ; HOLD lisinopril-   # Acute back pain: Progressive bone lesions on PET scan April 2025-status post Zometa  improved with tramadol .   # DM- on OHA - FBG- 119- recommend close monitoring of blood glucose levels. Recommend bringing log at this time.   # Hypothyroidism: on synthroid - check thyroid profile-   # infection prophylaxis: Continue  acyclovir  twice a day.   # ACP [06/05/2023]:  full code.   For 1st 2 cycles- Velcade  twice weekly- Dara-Velcade  q Weekly x 4 cycles- starting # 5-8 dara q 3W; cycles 9+- dara q 28 days  valcade;  Zometa  q 6 M-last 06/12/2023- will  need dex scrpit once every other week-   PS;  Disposition: # chemo today;  chemo-  # follow up in 1 week- MD: labs- cbc/cmp; chemo;  - d-4 chemo # follow up in 2 week- APP- labs- cbc/cmp; chemo; d-4 chemo  #  follow up in 3 week- MD: labs- cbc/cmp; chemo;  #  follow up in 4 week- MD: labs- cbc/cmp; MM panel. K/l light chains-chemo; Dr.B

## 2023-06-19 NOTE — Patient Instructions (Signed)
 HOLD demadex ; HOLD lisinopril- UNTIL further instructions-

## 2023-06-19 NOTE — Progress Notes (Signed)
 No appetite, nausea and vomiting, supplement drinks. Apple juice gives him reflux. He would like to know if he can drink ensure/boost/glucerna and any other recommendation.  C/o hiccups that started since last visit, constant. Takes his breath away. Asking for something to take to help.

## 2023-06-19 NOTE — Progress Notes (Signed)
 [facility] OFFICE PROGRESS NOTE  Patient Care Team: [name], MD as PCP - General (Internal Medicine) Eduardo Grade, MD as Consulting Physician (Dermatology) [name], MD as Consulting Physician (Hematology and Oncology)   Cancer Staging  Multiple myeloma in relapse [facility]) Staging form: Plasma Cell Myeloma and Plasma Cell Disorders, AJCC 8th Edition - Clinical: No stage assigned - Unsigned - Clinical: RISS Stage I (Beta-2-microglobulin (mg/L): 2.4, Albumin (g/dL): 3.5, ISS: Stage I, High-risk cytogenetics: Absent, LDH: Normal) - Signed by [name], MD on 06/19/2023 Beta 2 microglobulin range (mg/L): Less than 3.5 Albumin range (g/dL): Greater than or equal to 3.5 Cytogenetics: No abnormalities    Oncology History Overview Note   # April 2014- MULTIPLE MYELOMA [IgG- 2.2gm/dl; 56-21% plasma cell; Cyto-N; FISH- gain of chr.9, 15 & ccnd1/11q13] s/p RVD x6 [sep 2014; BMBx- ~5% plasma cells]; Maint Rev-DEX-Zometa ; March 2015-Stopped Dex Cont- Rev-Zometa ; July 2016- Rev 25mg ; NOV 4th  2016-HOLD; BMT eval at [facility] [Dr.Woods]; declined BMT.   # Re-START FEB 2017 Rev 15mg  3 W- on & 1 w OFF; HELD Rev June- Oct 2020- sec to worsening renal function [Dr.Singh]; SEP 2021- Rev 15 mg 2 w-On & 2 w-OFF. STOP REVLIMD in APRIL 2025-progression.    # APRIL 2025- . Diffuse lytic myelomatous lesions throughout the skeleton. Most of these lesions demonstrate hypermetabolism; Healing left mid clavicle fracture, likely pathologic; Early nondisplaced pathological fracture involving the right mid clavicle; Left pubic bone lesion has a defect in the outer cortex and this could be a pending pathologic fracture; . No findings for extraskeletal myeloma. Bone marrow Bx- on APRIL 25th, 2025- Pending.  # April 2025 discontinue lenalidomide .  # APRIL 29th, 2025- start Dara-Velcade  -Dex   # Zometa   # CKD [creat ~1.4; sec MM; Dr.Singh];  DM  -------------------------------------------------------------------------   DIAGNOSIS: [name] ] MULTIPLE MYELOMA  GOALS: control     Multiple myeloma in remission (HCC)  10/01/2015 Initial Diagnosis   Multiple myeloma in remission (HCC)   Multiple myeloma in relapse (HCC)  05/14/2023 Initial Diagnosis   Multiple myeloma in relapse (HCC)   06/05/2023 -  Chemotherapy   Patient is on Treatment Plan : MYELOMA RELAPSED / REFRACTORY Daratumumab  SQ + Bortezomib  + Dexamethasone  (DaraVd) q21d / Daratumumab  SQ q28d      06/05/2023 Cancer Staging   Staging form: Plasma Cell Myeloma and Plasma Cell Disorders, AJCC 8th Edition - Clinical: RISS Stage I (Beta-2-microglobulin (mg/L): 2.4, Albumin (g/dL): 3.5, ISS: Stage I, High-risk cytogenetics: Absent, LDH: Normal) - Signed by [name], MD on 06/19/2023 Beta 2 microglobulin range (mg/L): Less than 3.5 Albumin range (g/dL): Greater than or equal to 3.5 Cytogenetics: No abnormalities    INTERVAL HISTORY: pt is  with wife.  Ambulating independently.   Alan Alexander 78 y.o.  male pleasant patient above history of recurrent multiple myeloma  is here for follow-up/ and review the results of the bone marrow biopsy and to proceed with chemotherapy.   Patient has no appetite. Patient had nausea and vomiting; and diarrhea 2-3 days- improved after anti-emetics. Patient noted to have the GI symptoms after the having eating burgers.Also noted to have hiccups.   Lost weight.  C/o hiccups that started since last visit, constant. Takes his breath away.   Patient also started on tramadol  1 pills prn once a day. Noted to have improvement of his hip pain.  Patient denies any skin rash. No swelling in the legs.   Review of Systems  Constitutional:  Negative for chills, diaphoresis, fever, malaise/fatigue and weight loss.  HENT:  Negative for nosebleeds and sore throat.   Eyes:  Negative for double vision.  Respiratory:  Negative for cough,  hemoptysis, sputum production, shortness of breath and wheezing.   Cardiovascular:  Positive for leg swelling. Negative for chest pain, palpitations and orthopnea.  Gastrointestinal:  Negative for abdominal pain, blood in stool, constipation, diarrhea, heartburn, melena, nausea and vomiting.  Genitourinary:  Negative for dysuria, frequency and urgency.  Musculoskeletal:  Positive for back pain and joint pain.  Skin: Negative.  Negative for itching and rash.  Neurological:  Negative for dizziness, tingling, focal weakness, weakness and headaches.  Endo/Heme/Allergies:  Does not bruise/bleed easily.  Psychiatric/Behavioral:  Negative for depression. The patient is not nervous/anxious and does not have insomnia.       PAST MEDICAL HISTORY :  Past Medical History:  Diagnosis Date   Diabetes mellitus without complication (HCC)    GERD (gastroesophageal reflux disease)    Hyperlipidemia    Hypertension    Multiple myeloma (HCC)    Obesity    Thyroid cancer (HCC)    Thyroid disease     PAST SURGICAL HISTORY :   Past Surgical History:  Procedure Laterality Date   BONE MARROW BIOPSY  2014   CATARACT EXTRACTION BILATERAL W/ ANTERIOR VITRECTOMY     IR BONE MARROW BIOPSY & ASPIRATION  06/01/2023    FAMILY HISTORY :   Family History  Problem Relation Age of Onset   Cancer Father 81   Diabetes Mother     SOCIAL HISTORY:   Social History   Tobacco Use   Smoking status: Never   Smokeless tobacco: Never  Substance Use Topics   Alcohol use: No   Drug use: No    ALLERGIES:  has no known allergies.  MEDICATIONS:  Current Outpatient Medications  Medication Sig Dispense Refill   acyclovir  (ZOVIRAX ) 400 MG tablet Take 1 tablet (400 mg total) by mouth 2 (two) times daily. 120 tablet 2   amLODipine  (NORVASC ) 10 MG tablet Take 10 mg by mouth daily.     aspirin EC 81 MG tablet Take 81 mg by mouth daily.     atorvastatin  (LIPITOR) 20 MG tablet Take 20 mg by mouth daily.      chlorproMAZINE (THORAZINE) 25 MG tablet Take 1 tablet (25 mg total) by mouth 3 (three) times daily. 60 tablet 1   cloNIDine  (CATAPRES ) 0.1 MG tablet Take 0.1 mg by mouth 2 (two) times daily.     Cyanocobalamin 1000 MCG TBCR Take by mouth.     cyclobenzaprine  (FLEXERIL ) 5 MG tablet Take 1 tablet (5 mg total) by mouth 3 (three) times daily as needed. 15 tablet 0   dapagliflozin propanediol (FARXIGA) 10 MG TABS tablet TAKE 10 MG BY MOUTH 1 (ONE) TIME EACH DAY IN THE MORNING     ergocalciferol  (VITAMIN D2) 1.25 MG (50000 UT) capsule Take 1 capsule (50,000 Units total) by mouth once a week. 12 capsule 1   glipiZIDE (GLUCOTROL) 5 MG tablet Take 5 mg by mouth every morning.     levothyroxine  (SYNTHROID ) 137 MCG tablet Take 1 tablet by mouth daily.      lisinopril (ZESTRIL) 20 MG tablet Take 20 mg by mouth daily.     ondansetron  (ZOFRAN ) 8 MG tablet One pill every 8 hours as needed for nausea/vomitting. 40 tablet 1   ONETOUCH DELICA LANCETS 33G MISC Inject 1 Lancet as directed once daily.     pioglitazone  (ACTOS )  30 MG tablet Take 30 mg by mouth daily.     potassium chloride  SA (K-DUR,KLOR-CON ) 20 MEQ tablet Take 20 mEq by mouth 2 (two) times daily.   0   prochlorperazine  (COMPAZINE ) 10 MG tablet Take 1 tablet (10 mg total) by mouth every 6 (six) hours as needed for nausea or vomiting. 40 tablet 1   quinapril  (ACCUPRIL ) 40 MG tablet Take 40 mg by mouth daily.     torsemide  (DEMADEX ) 20 MG tablet Take 20 mg by mouth daily as needed.     traMADol  (ULTRAM ) 50 MG tablet Take 1 tablet (50 mg total) by mouth every 6 (six) hours as needed for moderate pain (pain score 4-6). 45 tablet 0   No current facility-administered medications for this visit.    PHYSICAL EXAMINATION: ECOG PERFORMANCE STATUS: 0 - Asymptomatic  BP 119/74 (BP Location: Left Arm, Patient Position: Sitting, Cuff Size: Large)   Pulse 99   Temp (!) 97.3 F (36.3 C) (Tympanic)   Ht 5\' 10"  (1.778 m)   Wt 206 lb 6.4 oz (93.6 kg)   SpO2 99%    BMI 29.62 kg/m   Filed Weights   06/19/23 0829  Weight: 206 lb 6.4 oz (93.6 kg)      Physical Exam Vitals and nursing note reviewed.  HENT:     Head: Normocephalic and atraumatic.     Mouth/Throat:     Pharynx: Oropharynx is clear. No oropharyngeal exudate.  Eyes:     Extraocular Movements: Extraocular movements intact.     Pupils: Pupils are equal, round, and reactive to light.  Cardiovascular:     Rate and Rhythm: Normal rate and regular rhythm.  Pulmonary:     Effort: No respiratory distress.     Breath sounds: No wheezing.     Comments: Decreased breath sounds bilaterally.  Abdominal:     General: Bowel sounds are normal. There is no distension.     Palpations: Abdomen is soft. There is no mass.     Tenderness: There is no abdominal tenderness. There is no guarding or rebound.  Musculoskeletal:        General: No tenderness. Normal range of motion.     Cervical back: Normal range of motion and neck supple.  Skin:    General: Skin is warm.  Neurological:     General: No focal deficit present.     Mental Status: He is alert and oriented to person, place, and time.  Psychiatric:        Mood and Affect: Affect normal.        Behavior: Behavior normal.        Judgment: Judgment normal.       LABORATORY DATA:  I have reviewed the data as listed    Component Value Date/Time   NA 138 06/19/2023 0820   NA 139 05/01/2014 1322   K 4.6 06/19/2023 0820   K 4.4 05/01/2014 1322   CL 98 06/19/2023 0820   CL 107 05/01/2014 1322   CO2 23 06/19/2023 0820   CO2 27 05/01/2014 1322   GLUCOSE 146 (H) 06/19/2023 0820   GLUCOSE 102 (H) 05/01/2014 1322   BUN 52 (H) 06/19/2023 0820   BUN 16 05/01/2014 1322   CREATININE 1.77 (H) 06/19/2023 0820   CREATININE 1.48 (H) 05/29/2014 0949   CALCIUM 7.7 (L) 06/19/2023 0820   CALCIUM 9.1 05/01/2014 1322   PROT 6.7 06/19/2023 0820   PROT 7.5 05/01/2014 1322   ALBUMIN 3.2 (L) 06/19/2023 0820  ALBUMIN 3.6 05/01/2014 1322   AST  14 (L) 06/19/2023 0820   ALT 17 06/19/2023 0820   ALT 13 (L) 05/01/2014 1322   ALKPHOS 69 06/19/2023 0820   ALKPHOS 47 05/01/2014 1322   BILITOT 1.5 (H) 06/19/2023 0820   GFRNONAA 39 (L) 06/19/2023 0820   GFRNONAA 47 (L) 05/29/2014 0949   GFRAA 47 (L) 10/21/2019 1106   GFRAA 55 (L) 05/29/2014 0949    No results found for: "SPEP", "UPEP"  Lab Results  Component Value Date   WBC 8.3 06/19/2023   NEUTROABS 6.7 06/19/2023   HGB 13.9 06/19/2023   HCT 42.1 06/19/2023   MCV 92.5 06/19/2023   PLT 217 06/19/2023      Chemistry      Component Value Date/Time   NA 138 06/19/2023 0820   NA 139 05/01/2014 1322   K 4.6 06/19/2023 0820   K 4.4 05/01/2014 1322   CL 98 06/19/2023 0820   CL 107 05/01/2014 1322   CO2 23 06/19/2023 0820   CO2 27 05/01/2014 1322   BUN 52 (H) 06/19/2023 0820   BUN 16 05/01/2014 1322   CREATININE 1.77 (H) 06/19/2023 0820   CREATININE 1.48 (H) 05/29/2014 0949      Component Value Date/Time   CALCIUM 7.7 (L) 06/19/2023 0820   CALCIUM 9.1 05/01/2014 1322   ALKPHOS 69 06/19/2023 0820   ALKPHOS 47 05/01/2014 1322   AST 14 (L) 06/19/2023 0820   ALT 17 06/19/2023 0820   ALT 13 (L) 05/01/2014 1322   BILITOT 1.5 (H) 06/19/2023 0820       RADIOGRAPHIC STUDIES: I have personally reviewed the radiological images as listed and agreed with the findings in the report. No results found.   ASSESSMENT & PLAN:  Multiple myeloma in relapse (HCC) #Multiple myeloma-most recently on maintenance Revlimid  15 mg 2  weeks on 2 week off.  JAN 2025-  M-protein- 1.3[April 2023- NEG]; kappa=113 lambda light chain ratio=2.5 [July 2020- 2.0]. RISING- ca- 10.4. # APRIL 2025- . Diffuse lytic myelomatous lesions throughout the skeleton. Most of these lesions demonstrate hypermetabolism; Healing left mid clavicle fracture, likely pathologic; Early nondisplaced pathological fracture involving the right mid clavicle; Left pubic bone lesion has a defect in the outer cortex and this  could be a pending pathologic fracture; . No findings for extraskeletal myeloma. Bone marrow Bx- on APRIL 25th, 2025- Pending results.  April 2025 discontinue lenalidomide . On Dara Velcade  dexamethasone .    # proceed with Dara Velcade  dexamethasone .  cycle # 1 day 15 today. Labs-CBC/chemistries were reviewed with the patient. However will need to consider consolidation therapy.  June 2nd, 2025- awaiting [facility] consideration re:CAR-T therapy/stem cell.   # GI: nausea and vomiting; and diarrhea 2-3 days/hiccups/weight loss: Recommend ensure/boost/glucerna. Send scipt for thorazine- Cut Dex dose-will plan weekly total of 20 mg of dexamethasone .  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 Aaron Aas April 2024- Vit D 38- recommend Vit D 50,K/weekly- HOLD demadex ; HOLD lisinopril-   # Acute back pain: Progressive bone lesions on PET scan April 2025-status post Zometa  improved with tramadol .   # DM- on OHA - FBG- 119- recommend close monitoring of blood glucose levels. Recommend bringing log at this time.   # Hypothyroidism: on synthroid - check thyroid profile-   # infection prophylaxis: Continue  acyclovir  twice a day.   # ACP [06/05/2023]:  full code.   For 1st 2 cycles- Velcade  twice weekly- Dara-Velcade  q Weekly  x 4 cycles- starting # 5-8 dara q 3W; cycles 9+- dara q 28 days  valcade;  Zometa  q 6 M-last 06/12/2023- will need dex scrpit once every other week-   PS;  Disposition: # chemo today;  chemo-  # follow up in 1 week- MD: labs- cbc/cmp; chemo;  - d-4 chemo # follow up in 2 week- APP- labs- cbc/cmp; chemo; d-4 chemo  #  follow up in 3 week- MD: labs- cbc/cmp; chemo;  #  follow up in 4 week- MD: labs- cbc/cmp; MM panel. K/l light chains-chemo; Dr.B        Orders Placed This Encounter  Procedures   CMP (Cancer Center only)    Standing Status:   Future    Number of Occurrences:   1    Expiration Date:   06/18/2024    CBC with Differential (Cancer Center Only)    Standing Status:   Future    Expected Date:   07/17/2023    Expiration Date:   07/16/2024   CMP (Cancer Center only)    Standing Status:   Future    Expected Date:   07/17/2023    Expiration Date:   07/16/2024    All questions were answered. The patient knows to call the clinic with any problems, questions or concerns.      [name], MD 06/19/2023 12:51 PM

## 2023-06-20 ENCOUNTER — Other Ambulatory Visit: Payer: Self-pay

## 2023-06-25 ENCOUNTER — Encounter: Payer: Self-pay | Admitting: Internal Medicine

## 2023-06-26 ENCOUNTER — Inpatient Hospital Stay

## 2023-06-26 ENCOUNTER — Inpatient Hospital Stay (HOSPITAL_BASED_OUTPATIENT_CLINIC_OR_DEPARTMENT_OTHER): Admitting: Internal Medicine

## 2023-06-26 ENCOUNTER — Telehealth: Payer: Self-pay | Admitting: *Deleted

## 2023-06-26 ENCOUNTER — Encounter: Payer: Self-pay | Admitting: Internal Medicine

## 2023-06-26 VITALS — BP 137/78 | HR 79 | Temp 97.0°F | Resp 18

## 2023-06-26 VITALS — BP 112/85 | HR 80 | Temp 97.4°F | Resp 20 | Ht 70.0 in | Wt 202.5 lb

## 2023-06-26 DIAGNOSIS — C9002 Multiple myeloma in relapse: Secondary | ICD-10-CM

## 2023-06-26 DIAGNOSIS — Z5111 Encounter for antineoplastic chemotherapy: Secondary | ICD-10-CM | POA: Diagnosis not present

## 2023-06-26 LAB — CMP (CANCER CENTER ONLY)
ALT: 28 U/L (ref 0–44)
AST: 25 U/L (ref 15–41)
Albumin: 3.2 g/dL — ABNORMAL LOW (ref 3.5–5.0)
Alkaline Phosphatase: 70 U/L (ref 38–126)
Anion gap: 8 (ref 5–15)
BUN: 14 mg/dL (ref 8–23)
CO2: 25 mmol/L (ref 22–32)
Calcium: 8 mg/dL — ABNORMAL LOW (ref 8.9–10.3)
Chloride: 102 mmol/L (ref 98–111)
Creatinine: 1.43 mg/dL — ABNORMAL HIGH (ref 0.61–1.24)
GFR, Estimated: 50 mL/min — ABNORMAL LOW (ref >60)
Glucose, Bld: 141 mg/dL — ABNORMAL HIGH (ref 70–99)
Potassium: 3.8 mmol/L (ref 3.5–5.1)
Sodium: 135 mmol/L (ref 135–145)
Total Bilirubin: 0.8 mg/dL (ref 0.0–1.2)
Total Protein: 6.5 g/dL (ref 6.5–8.1)

## 2023-06-26 LAB — CBC WITH DIFFERENTIAL (CANCER CENTER ONLY)
Abs Immature Granulocytes: 0.03 10*3/uL (ref 0.00–0.07)
Basophils Absolute: 0 10*3/uL (ref 0.0–0.1)
Basophils Relative: 0 %
Eosinophils Absolute: 0 10*3/uL (ref 0.0–0.5)
Eosinophils Relative: 1 %
HCT: 39.9 % (ref 39.0–52.0)
Hemoglobin: 13.2 g/dL (ref 13.0–17.0)
Immature Granulocytes: 1 %
Lymphocytes Relative: 6 %
Lymphs Abs: 0.3 10*3/uL — ABNORMAL LOW (ref 0.7–4.0)
MCH: 30.8 pg (ref 26.0–34.0)
MCHC: 33.1 g/dL (ref 30.0–36.0)
MCV: 93.2 fL (ref 80.0–100.0)
Monocytes Absolute: 0.6 10*3/uL (ref 0.1–1.0)
Monocytes Relative: 11 %
Neutro Abs: 4.2 10*3/uL (ref 1.7–7.7)
Neutrophils Relative %: 81 %
Platelet Count: 250 10*3/uL (ref 150–400)
RBC: 4.28 MIL/uL (ref 4.22–5.81)
RDW: 15.4 % (ref 11.5–15.5)
WBC Count: 5.2 10*3/uL (ref 4.0–10.5)
nRBC: 0 % (ref 0.0–0.2)

## 2023-06-26 MED ORDER — DEXAMETHASONE 4 MG PO TABS
20.0000 mg | ORAL_TABLET | Freq: Once | ORAL | Status: AC
Start: 2023-06-26 — End: 2023-06-26
  Administered 2023-06-26: 20 mg via ORAL
  Filled 2023-06-26: qty 5

## 2023-06-26 MED ORDER — DIPHENHYDRAMINE HCL 25 MG PO CAPS
50.0000 mg | ORAL_CAPSULE | Freq: Once | ORAL | Status: AC
  Administered 2023-06-26: 50 mg via ORAL
  Filled 2023-06-26: qty 2

## 2023-06-26 MED ORDER — DARATUMUMAB-HYALURONIDASE-FIHJ 1800-30000 MG-UT/15ML ~~LOC~~ SOLN
1800.0000 mg | Freq: Once | SUBCUTANEOUS | Status: AC
  Administered 2023-06-26: 1800 mg via SUBCUTANEOUS
  Filled 2023-06-26: qty 15

## 2023-06-26 MED ORDER — BORTEZOMIB CHEMO SQ INJECTION 3.5 MG (2.5MG/ML)
1.3000 mg/m2 | Freq: Once | INTRAMUSCULAR | Status: AC
  Administered 2023-06-26: 2.75 mg via SUBCUTANEOUS
  Filled 2023-06-26: qty 1.1

## 2023-06-26 MED ORDER — ACETAMINOPHEN 325 MG PO TABS
650.0000 mg | ORAL_TABLET | Freq: Once | ORAL | Status: AC
  Administered 2023-06-26: 650 mg via ORAL
  Filled 2023-06-26: qty 2

## 2023-06-26 NOTE — Telephone Encounter (Signed)
 Return call to patient and wife to review current medications. Pt called out every medication he is taking. I corrected medication list. I also had pt pull his lisinopril and discard that prescription. He did not have a bottle for accupril . He remembers now that Dr Zelda Hickman stopped that medication.

## 2023-06-26 NOTE — Addendum Note (Signed)
 Addended by: Dominique Frieze A on: 06/26/2023 03:24 PM   Modules accepted: Orders

## 2023-06-26 NOTE — Patient Instructions (Signed)
 CONTINUE TO HOLD demadex ;   HOLD QUINAPRIL -LISINOPRIL   # recommend bringing medications at next visit.

## 2023-06-26 NOTE — Patient Instructions (Signed)
 [name] - A DEPT OF [facility]  Discharge Instructions: Thank you for choosing [facility] to provide your oncology and hematology care.  If you have a lab appointment with the Cancer Center, please go directly to the Cancer Center and check in at the registration area.  Wear comfortable clothing and clothing appropriate for easy access to any Portacath or PICC line.   We strive to give you quality time with your provider. You may need to reschedule your appointment if you arrive late (15 or more minutes).  Arriving late affects you and other patients whose appointments are after yours.  Also, if you miss three or more appointments without notifying the office, you may be dismissed from the clinic at the provider's discretion.      For prescription refill requests, have your pharmacy contact our office and allow 72 hours for refills to be completed.    Today you received the following chemotherapy and/or immunotherapy agents Velcade and Darzalex      To help prevent nausea and vomiting after your treatment, we encourage you to take your nausea medication as directed.  BELOW ARE SYMPTOMS THAT SHOULD BE REPORTED IMMEDIATELY: *FEVER GREATER THAN 100.4 F (38 C) OR HIGHER *CHILLS OR SWEATING *NAUSEA AND VOMITING THAT IS NOT CONTROLLED WITH YOUR NAUSEA MEDICATION *UNUSUAL SHORTNESS OF BREATH *UNUSUAL BRUISING OR BLEEDING *URINARY PROBLEMS (pain or burning when urinating, or frequent urination) *BOWEL PROBLEMS (unusual diarrhea, constipation, pain near the anus) TENDERNESS IN MOUTH AND THROAT WITH OR WITHOUT PRESENCE OF ULCERS (sore throat, sores in mouth, or a toothache) UNUSUAL RASH, SWELLING OR PAIN  UNUSUAL VAGINAL DISCHARGE OR ITCHING   Items with * indicate a potential emergency and should be followed up as soon as possible or go to the Emergency Department if any problems should occur.  Please show the CHEMOTHERAPY ALERT CARD or  IMMUNOTHERAPY ALERT CARD at check-in to the Emergency Department and triage nurse.  Should you have questions after your visit or need to cancel or reschedule your appointment, please contact [name] - A DEPT OF [facility]  [phone] and follow the prompts.  Office hours are 8:00 a.m. to 4:30 p.m. Monday - Friday. Please note that voicemails left after 4:00 p.m. may not be returned until the following business day.  We are closed weekends and major holidays. You have access to a nurse at all times for urgent questions. Please call the main number to the clinic [phone] and follow the prompts.  For any non-urgent questions, you may also contact your provider using MyChart. We now offer e-Visits for anyone 72 and older to request care online for non-urgent symptoms. For details visit [email or web address].   Also download the MyChart app! Go to the app store, search "MyChart", open the app, select [facility], and log in with your MyChart username and password.

## 2023-06-26 NOTE — Progress Notes (Signed)
 Patient has no concerns

## 2023-06-26 NOTE — Progress Notes (Signed)
 [facility] OFFICE PROGRESS NOTE  Patient Care Team: [name], MD as PCP - General (Internal Medicine) Eduardo Grade, MD as Consulting Physician (Dermatology) [name], MD as Consulting Physician (Hematology and Oncology)   Cancer Staging  Multiple myeloma in relapse [facility]) Staging form: Plasma Cell Myeloma and Plasma Cell Disorders, AJCC 8th Edition - Clinical: No stage assigned - Unsigned - Clinical: RISS Stage I (Beta-2-microglobulin (mg/L): 2.4, Albumin (g/dL): 3.5, ISS: Stage I, High-risk cytogenetics: Absent, LDH: Normal) - Signed by [name], MD on 06/19/2023 Beta 2 microglobulin range (mg/L): Less than 3.5 Albumin range (g/dL): Greater than or equal to 3.5 Cytogenetics: No abnormalities    Oncology History Overview Note   # April 2014- MULTIPLE MYELOMA [IgG- 2.2gm/dl; 16-10% plasma cell; Cyto-N; FISH- gain of chr.9, 15 & ccnd1/11q13] s/p RVD x6 [sep 2014; BMBx- ~5% plasma cells]; Maint Rev-DEX-Zometa ; March 2015-Stopped Dex Cont- Rev-Zometa ; July 2016- Rev 25mg ; NOV 4th  2016-HOLD; BMT eval at [facility] [Dr.Woods]; declined BMT.   # Re-START FEB 2017 Rev 15mg  3 W- on & 1 w OFF; HELD Rev June- Oct 2020- sec to worsening renal function [Dr.Singh]; SEP 2021- Rev 15 mg 2 w-On & 2 w-OFF. STOP REVLIMD in APRIL 2025-progression.    # APRIL 2025- . Diffuse lytic myelomatous lesions throughout the skeleton. Most of these lesions demonstrate hypermetabolism; Healing left mid clavicle fracture, likely pathologic; Early nondisplaced pathological fracture involving the right mid clavicle; Left pubic bone lesion has a defect in the outer cortex and this could be a pending pathologic fracture; . No findings for extraskeletal myeloma. Bone marrow Bx- on APRIL 25th, 2025- Pending.  # April 2025 discontinue lenalidomide .  # APRIL 29th, 2025- start Dara-Velcade  -Dex   # Zometa   # CKD [creat ~1.4; sec MM; Dr.Singh];  DM  -------------------------------------------------------------------------   DIAGNOSIS: [facility] ] MULTIPLE MYELOMA  GOALS: control     Multiple myeloma in remission (HCC)  10/01/2015 Initial Diagnosis   Multiple myeloma in remission (HCC)   Multiple myeloma in relapse (HCC)  05/14/2023 Initial Diagnosis   Multiple myeloma in relapse (HCC)   06/05/2023 -  Chemotherapy   Patient is on Treatment Plan : MYELOMA RELAPSED / REFRACTORY Daratumumab  SQ + Bortezomib  + Dexamethasone  (DaraVd) q21d / Daratumumab  SQ q28d      06/05/2023 Cancer Staging   Staging form: Plasma Cell Myeloma and Plasma Cell Disorders, AJCC 8th Edition - Clinical: RISS Stage I (Beta-2-microglobulin (mg/L): 2.4, Albumin (g/dL): 3.5, ISS: Stage I, High-risk cytogenetics: Absent, LDH: Normal) - Signed by [name], MD on 06/19/2023 Beta 2 microglobulin range (mg/L): Less than 3.5 Albumin range (g/dL): Greater than or equal to 3.5 Cytogenetics: No abnormalities    INTERVAL HISTORY: pt is  with wife.  Ambulating independently.   Alan Alexander 78 y.o.  male pleasant patient above history of recurrent multiple myeloma  is to  proceed with chemotherapy.   Improved hiccups. Patient also started on tramadol  1 pills prn twice a day. Noted to have improvement of his hip pain.  Patient denies any skin rash. No swelling in the legs.   Review of Systems  Constitutional:  Negative for chills, diaphoresis, fever, malaise/fatigue and weight loss.  HENT:  Negative for nosebleeds and sore throat.   Eyes:  Negative for double vision.  Respiratory:  Negative for cough, hemoptysis, sputum production, shortness of breath and wheezing.   Cardiovascular:  Positive for leg swelling. Negative for chest pain, palpitations and orthopnea.  Gastrointestinal:  Negative for abdominal  pain, blood in stool, constipation, diarrhea, heartburn, melena, nausea and vomiting.  Genitourinary:  Negative for dysuria, frequency and urgency.   Musculoskeletal:  Positive for back pain and joint pain.  Skin: Negative.  Negative for itching and rash.  Neurological:  Negative for dizziness, tingling, focal weakness, weakness and headaches.  Endo/Heme/Allergies:  Does not bruise/bleed easily.  Psychiatric/Behavioral:  Negative for depression. The patient is not nervous/anxious and does not have insomnia.       PAST MEDICAL HISTORY :  Past Medical History:  Diagnosis Date   Diabetes mellitus without complication (HCC)    GERD (gastroesophageal reflux disease)    Hyperlipidemia    Hypertension    Multiple myeloma (HCC)    Obesity    Thyroid cancer (HCC)    Thyroid disease     PAST SURGICAL HISTORY :   Past Surgical History:  Procedure Laterality Date   BONE MARROW BIOPSY  2014   CATARACT EXTRACTION BILATERAL W/ ANTERIOR VITRECTOMY     IR BONE MARROW BIOPSY & ASPIRATION  06/01/2023    FAMILY HISTORY :   Family History  Problem Relation Age of Onset   Cancer Father 30   Diabetes Mother     SOCIAL HISTORY:   Social History   Tobacco Use   Smoking status: Never   Smokeless tobacco: Never  Substance Use Topics   Alcohol use: No   Drug use: No    ALLERGIES:  has no known allergies.  MEDICATIONS:  Current Outpatient Medications  Medication Sig Dispense Refill   acyclovir  (ZOVIRAX ) 400 MG tablet Take 1 tablet (400 mg total) by mouth 2 (two) times daily. 120 tablet 2   amLODipine  (NORVASC ) 10 MG tablet Take 10 mg by mouth daily.     aspirin EC 81 MG tablet Take 81 mg by mouth daily.     atorvastatin  (LIPITOR) 20 MG tablet Take 20 mg by mouth daily.     chlorproMAZINE  (THORAZINE ) 25 MG tablet Take 1 tablet (25 mg total) by mouth 3 (three) times daily. 60 tablet 1   cloNIDine  (CATAPRES ) 0.1 MG tablet Take 0.1 mg by mouth 2 (two) times daily.     Cyanocobalamin 1000 MCG TBCR Take by mouth.     cyclobenzaprine  (FLEXERIL ) 5 MG tablet Take 1 tablet (5 mg total) by mouth 3 (three) times daily as needed. 15 tablet 0    dapagliflozin propanediol (FARXIGA) 10 MG TABS tablet TAKE 10 MG BY MOUTH 1 (ONE) TIME EACH DAY IN THE MORNING     ergocalciferol  (VITAMIN D2) 1.25 MG (50000 UT) capsule Take 1 capsule (50,000 Units total) by mouth once a week. 12 capsule 1   glipiZIDE (GLUCOTROL) 5 MG tablet Take 5 mg by mouth every morning.     levothyroxine  (SYNTHROID ) 137 MCG tablet Take 1 tablet by mouth daily.      lisinopril (ZESTRIL) 20 MG tablet Take 20 mg by mouth daily.     ondansetron  (ZOFRAN ) 8 MG tablet One pill every 8 hours as needed for nausea/vomitting. 40 tablet 1   ONETOUCH DELICA LANCETS 33G MISC Inject 1 Lancet as directed once daily.     pioglitazone  (ACTOS ) 30 MG tablet Take 30 mg by mouth daily.     potassium chloride  SA (K-DUR,KLOR-CON ) 20 MEQ tablet Take 20 mEq by mouth 2 (two) times daily.   0   prochlorperazine  (COMPAZINE ) 10 MG tablet Take 1 tablet (10 mg total) by mouth every 6 (six) hours as needed for nausea or vomiting. 40 tablet 1  quinapril  (ACCUPRIL ) 40 MG tablet Take 40 mg by mouth daily.     torsemide  (DEMADEX ) 20 MG tablet Take 20 mg by mouth daily as needed.     traMADol  (ULTRAM ) 50 MG tablet Take 1 tablet (50 mg total) by mouth every 6 (six) hours as needed for moderate pain (pain score 4-6). 45 tablet 0   No current facility-administered medications for this visit.    PHYSICAL EXAMINATION: ECOG PERFORMANCE STATUS: 0 - Asymptomatic  BP 112/85   Pulse 80   Temp (!) 97.4 F (36.3 C)   Resp 20   Ht 5\' 10"  (1.778 m)   Wt 202 lb 8 oz (91.9 kg)   SpO2 100%   BMI 29.06 kg/m   Filed Weights   06/26/23 0900  Weight: 202 lb 8 oz (91.9 kg)      Physical Exam Vitals and nursing note reviewed.  HENT:     Head: Normocephalic and atraumatic.     Mouth/Throat:     Pharynx: Oropharynx is clear. No oropharyngeal exudate.  Eyes:     Extraocular Movements: Extraocular movements intact.     Pupils: Pupils are equal, round, and reactive to light.  Cardiovascular:     Rate and  Rhythm: Normal rate and regular rhythm.  Pulmonary:     Effort: No respiratory distress.     Breath sounds: No wheezing.     Comments: Decreased breath sounds bilaterally.  Abdominal:     General: Bowel sounds are normal. There is no distension.     Palpations: Abdomen is soft. There is no mass.     Tenderness: There is no abdominal tenderness. There is no guarding or rebound.  Musculoskeletal:        General: No tenderness. Normal range of motion.     Cervical back: Normal range of motion and neck supple.  Skin:    General: Skin is warm.  Neurological:     General: No focal deficit present.     Mental Status: He is alert and oriented to person, place, and time.  Psychiatric:        Mood and Affect: Affect normal.        Behavior: Behavior normal.        Judgment: Judgment normal.       LABORATORY DATA:  I have reviewed the data as listed    Component Value Date/Time   NA 135 06/26/2023 0909   NA 139 05/01/2014 1322   K 3.8 06/26/2023 0909   K 4.4 05/01/2014 1322   CL 102 06/26/2023 0909   CL 107 05/01/2014 1322   CO2 25 06/26/2023 0909   CO2 27 05/01/2014 1322   GLUCOSE 141 (H) 06/26/2023 0909   GLUCOSE 102 (H) 05/01/2014 1322   BUN 14 06/26/2023 0909   BUN 16 05/01/2014 1322   CREATININE 1.43 (H) 06/26/2023 0909   CREATININE 1.48 (H) 05/29/2014 0949   CALCIUM 8.0 (L) 06/26/2023 0909   CALCIUM 9.1 05/01/2014 1322   PROT 6.5 06/26/2023 0909   PROT 7.5 05/01/2014 1322   ALBUMIN 3.2 (L) 06/26/2023 0909   ALBUMIN 3.6 05/01/2014 1322   AST 25 06/26/2023 0909   ALT 28 06/26/2023 0909   ALT 13 (L) 05/01/2014 1322   ALKPHOS 70 06/26/2023 0909   ALKPHOS 47 05/01/2014 1322   BILITOT 0.8 06/26/2023 0909   GFRNONAA 50 (L) 06/26/2023 0909   GFRNONAA 47 (L) 05/29/2014 0949   GFRAA 47 (L) 10/21/2019 1106   GFRAA 55 (L) 05/29/2014 1610  No results found for: "SPEP", "UPEP"  Lab Results  Component Value Date   WBC 5.2 06/26/2023   NEUTROABS 4.2 06/26/2023   HGB  13.2 06/26/2023   HCT 39.9 06/26/2023   MCV 93.2 06/26/2023   PLT 250 06/26/2023      Chemistry      Component Value Date/Time   NA 135 06/26/2023 0909   NA 139 05/01/2014 1322   K 3.8 06/26/2023 0909   K 4.4 05/01/2014 1322   CL 102 06/26/2023 0909   CL 107 05/01/2014 1322   CO2 25 06/26/2023 0909   CO2 27 05/01/2014 1322   BUN 14 06/26/2023 0909   BUN 16 05/01/2014 1322   CREATININE 1.43 (H) 06/26/2023 0909   CREATININE 1.48 (H) 05/29/2014 0949      Component Value Date/Time   CALCIUM 8.0 (L) 06/26/2023 0909   CALCIUM 9.1 05/01/2014 1322   ALKPHOS 70 06/26/2023 0909   ALKPHOS 47 05/01/2014 1322   AST 25 06/26/2023 0909   ALT 28 06/26/2023 0909   ALT 13 (L) 05/01/2014 1322   BILITOT 0.8 06/26/2023 0909       RADIOGRAPHIC STUDIES: I have personally reviewed the radiological images as listed and agreed with the findings in the report. No results found.   ASSESSMENT & PLAN:  Multiple myeloma in relapse (HCC) #Multiple myeloma-most recently on maintenance Revlimid  15 mg 2  weeks on 2 week off.  JAN 2025-  M-protein- 1.3[April 2023- NEG]; kappa=113 lambda light chain ratio=2.5 [July 2020- 2.0]. RISING- ca- 10.4. # APRIL 2025- . Diffuse lytic myelomatous lesions throughout the skeleton. Most of these lesions demonstrate hypermetabolism; Healing left mid clavicle fracture, likely pathologic; Early nondisplaced pathological fracture involving the right mid clavicle; Left pubic bone lesion has a defect in the outer cortex and this could be a pending pathologic fracture; . No findings for extraskeletal myeloma. Bone marrow Bx- on APRIL 25th, 2025- Pending results.  April 2025 discontinue lenalidomide . On Dara Velcade  dexamethasone .    # proceed with Dara Velcade  dexamethasone .  cycle # 1 [id number] today. Labs-CBC/chemistries were reviewed with the patient. However will need to consider consolidation therapy.  June 2nd, 2025- awaiting [facility] consideration re:CAR-T therapy/stem cell.   #  GI: nausea and vomiting; and diarrhea 2-3 days/hiccups/weight loss- improved- continue 20 mg of dexamethasone .  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 Aaron Aas April 2024- Vit D 38- recommend Vit D 50,K/weekly- CONTINUE TO HOLD demadex ; HOLD QUINAPRIL - recommend bringing medications at next visit.   # Acute back pain: Progressive bone lesions on PET scan April 2025-status post Zometa  improved with tramadol .   # DM- on OHA -recommend close monitoring of blood glucose levels. Recommend bringing log at this time.  stable  # Hypothyroidism: on synthroid - check thyroid profile- stable  # infection prophylaxis: Continue  acyclovir  twice a day.  stable  # ACP [06/05/2023]:  full code.   For 1st 2 cycles- Velcade  twice weekly- Dara-Velcade  q Weekly x 4 cycles- starting # 5-8 dara q 3W; cycles 9+- dara q 28 days  valcade;  Zometa  q 6 M-last 06/12/2023- will need dex scrpit once every other week-   PS;  Disposition: # chemo today;  chemo-  # follow up in 1 week- APP : labs- cbc/cmp; chemo;- d-4 chemo # follow up in 2 week- MD- labs- cbc/cmp; chemo; d-4 chemo  #  follow up in 3 week- MD: labs- cbc/cmp; chemo; MM panel. K/l  light chains #  follow up in 4 week- MD: labs- cbc/cmp; -chemo; Dr.B        Orders Placed This Encounter  Procedures   CBC with Differential (Cancer Center Only)    Standing Status:   Future    Expected Date:   07/24/2023    Expiration Date:   07/23/2024   CMP (Cancer Center only)    Standing Status:   Future    Expected Date:   07/24/2023    Expiration Date:   07/23/2024   Kappa/lambda light chains    Standing Status:   Future    Expected Date:   07/17/2023    Expiration Date:   06/25/2024   Multiple Myeloma Panel (SPEP&IFE w/QIG)    Standing Status:   Future    Expected Date:   07/17/2023    Expiration Date:   06/25/2024    All questions were answered. The patient knows to call the clinic with  any problems, questions or concerns.      Alan Hanway R Shervon Kerwin, MD 06/26/2023 1:12 PM

## 2023-06-26 NOTE — Assessment & Plan Note (Addendum)
#  Multiple myeloma-most recently on maintenance Revlimid  15 mg 2  weeks on 2 week off.  JAN 2025-  M-protein- 1.3[April 2023- NEG]; kappa=113 lambda light chain ratio=2.5 [July 2020- 2.0]. RISING- ca- 10.4. # APRIL 2025- . Diffuse lytic myelomatous lesions throughout the skeleton. Most of these lesions demonstrate hypermetabolism; Healing left mid clavicle fracture, likely pathologic; Early nondisplaced pathological fracture involving the right mid clavicle; Left pubic bone lesion has a defect in the outer cortex and this could be a pending pathologic fracture; . No findings for extraskeletal myeloma. Bone marrow Bx- on APRIL 25th, 2025- Pending results.  April 2025 discontinue lenalidomide . On Dara Velcade  dexamethasone .    # proceed with Dara Velcade  dexamethasone .  cycle # 1 [id number] today. Labs-CBC/chemistries were reviewed with the patient. However will need to consider consolidation therapy.  June 2nd, 2025- awaiting [facility] consideration re:CAR-T therapy/stem cell.   # GI: nausea and vomiting; and diarrhea 2-3 days/hiccups/weight loss- improved- continue 20 mg of dexamethasone .  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 Aaron Aas April 2024- Vit D 38- recommend Vit D 50,K/weekly- CONTINUE TO HOLD demadex ; HOLD QUINAPRIL - recommend bringing medications at next visit.   # Acute back pain: Progressive bone lesions on PET scan April 2025-status post Zometa  improved with tramadol .   # DM- on OHA -recommend close monitoring of blood glucose levels. Recommend bringing log at this time.  stable  # Hypothyroidism: on synthroid - check thyroid profile- stable  # infection prophylaxis: Continue  acyclovir  twice a day.  stable  # ACP [06/05/2023]:  full code.   For 1st 2 cycles- Velcade  twice weekly- Dara-Velcade  q Weekly x 4 cycles- starting # 5-8 dara q 3W; cycles 9+- dara q 28 days  valcade;  Zometa  q 6 M-last 06/12/2023- will need dex scrpit  once every other week-   PS;  Disposition: # chemo today;  chemo-  # follow up in 1 week- APP : labs- cbc/cmp; chemo;- d-4 chemo # follow up in 2 week- MD- labs- cbc/cmp; chemo; d-4 chemo  #  follow up in 3 week- MD: labs- cbc/cmp; chemo; MM panel. K/l light chains #  follow up in 4 week- MD: labs- cbc/cmp; -chemo; Dr.B

## 2023-06-27 ENCOUNTER — Other Ambulatory Visit: Payer: Self-pay

## 2023-06-27 LAB — BETA 2 MICROGLOBULIN, SERUM: Beta-2 Microglobulin: 2.3 mg/L (ref 0.6–2.4)

## 2023-06-29 ENCOUNTER — Inpatient Hospital Stay

## 2023-06-29 VITALS — BP 136/81 | HR 78 | Temp 97.6°F

## 2023-06-29 DIAGNOSIS — Z5111 Encounter for antineoplastic chemotherapy: Secondary | ICD-10-CM | POA: Diagnosis not present

## 2023-06-29 DIAGNOSIS — C9002 Multiple myeloma in relapse: Secondary | ICD-10-CM

## 2023-06-29 MED ORDER — BORTEZOMIB CHEMO SQ INJECTION 3.5 MG (2.5MG/ML)
1.3000 mg/m2 | Freq: Once | INTRAMUSCULAR | Status: AC
  Administered 2023-06-29: 2.75 mg via SUBCUTANEOUS
  Filled 2023-06-29: qty 1.1

## 2023-06-29 MED ORDER — DEXAMETHASONE 4 MG PO TABS
10.0000 mg | ORAL_TABLET | Freq: Once | ORAL | Status: AC
  Administered 2023-06-29: 10 mg via ORAL
  Filled 2023-06-29: qty 3

## 2023-07-02 ENCOUNTER — Other Ambulatory Visit: Payer: Self-pay | Admitting: Internal Medicine

## 2023-07-03 ENCOUNTER — Encounter: Payer: Self-pay | Admitting: Internal Medicine

## 2023-07-03 ENCOUNTER — Inpatient Hospital Stay

## 2023-07-03 ENCOUNTER — Inpatient Hospital Stay (HOSPITAL_BASED_OUTPATIENT_CLINIC_OR_DEPARTMENT_OTHER): Admitting: Nurse Practitioner

## 2023-07-03 ENCOUNTER — Encounter: Payer: Self-pay | Admitting: Nurse Practitioner

## 2023-07-03 VITALS — BP 133/78 | HR 89 | Temp 97.3°F | Resp 18 | Wt 199.0 lb

## 2023-07-03 DIAGNOSIS — C9002 Multiple myeloma in relapse: Secondary | ICD-10-CM

## 2023-07-03 DIAGNOSIS — Z5111 Encounter for antineoplastic chemotherapy: Secondary | ICD-10-CM | POA: Diagnosis not present

## 2023-07-03 DIAGNOSIS — Z5112 Encounter for antineoplastic immunotherapy: Secondary | ICD-10-CM

## 2023-07-03 LAB — CMP (CANCER CENTER ONLY)
ALT: 24 U/L (ref 0–44)
AST: 17 U/L (ref 15–41)
Albumin: 3.7 g/dL (ref 3.5–5.0)
Alkaline Phosphatase: 96 U/L (ref 38–126)
Anion gap: 10 (ref 5–15)
BUN: 17 mg/dL (ref 8–23)
CO2: 29 mmol/L (ref 22–32)
Calcium: 7.7 mg/dL — ABNORMAL LOW (ref 8.9–10.3)
Chloride: 99 mmol/L (ref 98–111)
Creatinine: 1.49 mg/dL — ABNORMAL HIGH (ref 0.61–1.24)
GFR, Estimated: 47 mL/min — ABNORMAL LOW (ref >60)
Glucose, Bld: 176 mg/dL — ABNORMAL HIGH (ref 70–99)
Potassium: 3.1 mmol/L — ABNORMAL LOW (ref 3.5–5.1)
Sodium: 138 mmol/L (ref 135–145)
Total Bilirubin: 0.7 mg/dL (ref 0.0–1.2)
Total Protein: 6.5 g/dL (ref 6.5–8.1)

## 2023-07-03 LAB — CBC WITH DIFFERENTIAL (CANCER CENTER ONLY)
Abs Immature Granulocytes: 0.03 10*3/uL (ref 0.00–0.07)
Basophils Absolute: 0 10*3/uL (ref 0.0–0.1)
Basophils Relative: 0 %
Eosinophils Absolute: 0 10*3/uL (ref 0.0–0.5)
Eosinophils Relative: 0 %
HCT: 42.9 % (ref 39.0–52.0)
Hemoglobin: 13.9 g/dL (ref 13.0–17.0)
Immature Granulocytes: 1 %
Lymphocytes Relative: 9 %
Lymphs Abs: 0.4 10*3/uL — ABNORMAL LOW (ref 0.7–4.0)
MCH: 30 pg (ref 26.0–34.0)
MCHC: 32.4 g/dL (ref 30.0–36.0)
MCV: 92.7 fL (ref 80.0–100.0)
Monocytes Absolute: 0.4 10*3/uL (ref 0.1–1.0)
Monocytes Relative: 8 %
Neutro Abs: 3.9 10*3/uL (ref 1.7–7.7)
Neutrophils Relative %: 82 %
Platelet Count: 214 10*3/uL (ref 150–400)
RBC: 4.63 MIL/uL (ref 4.22–5.81)
RDW: 16.4 % — ABNORMAL HIGH (ref 11.5–15.5)
WBC Count: 4.7 10*3/uL (ref 4.0–10.5)
nRBC: 0.4 % — ABNORMAL HIGH (ref 0.0–0.2)

## 2023-07-03 MED ORDER — ACETAMINOPHEN 325 MG PO TABS
650.0000 mg | ORAL_TABLET | Freq: Once | ORAL | Status: AC
  Administered 2023-07-03: 650 mg via ORAL
  Filled 2023-07-03: qty 2

## 2023-07-03 MED ORDER — DARATUMUMAB-HYALURONIDASE-FIHJ 1800-30000 MG-UT/15ML ~~LOC~~ SOLN
1800.0000 mg | Freq: Once | SUBCUTANEOUS | Status: AC
  Administered 2023-07-03: 1800 mg via SUBCUTANEOUS
  Filled 2023-07-03: qty 15

## 2023-07-03 MED ORDER — BORTEZOMIB CHEMO SQ INJECTION 3.5 MG (2.5MG/ML)
1.3000 mg/m2 | Freq: Once | INTRAMUSCULAR | Status: AC
  Administered 2023-07-03: 2.75 mg via SUBCUTANEOUS
  Filled 2023-07-03: qty 1.1

## 2023-07-03 MED ORDER — DEXAMETHASONE 4 MG PO TABS
20.0000 mg | ORAL_TABLET | Freq: Once | ORAL | Status: AC
  Administered 2023-07-03: 20 mg via ORAL
  Filled 2023-07-03: qty 5

## 2023-07-03 MED ORDER — DIPHENHYDRAMINE HCL 25 MG PO CAPS
50.0000 mg | ORAL_CAPSULE | Freq: Once | ORAL | Status: AC
  Administered 2023-07-03: 50 mg via ORAL
  Filled 2023-07-03: qty 2

## 2023-07-03 NOTE — Progress Notes (Signed)
 [facility] OFFICE PROGRESS NOTE  Patient Care Team: [name], MD as PCP - General (Internal Medicine) Eduardo Grade, MD as Consulting Physician (Dermatology) [name], MD as Consulting Physician (Hematology and Oncology)   Cancer Staging  Multiple myeloma in relapse [facility]) Staging form: Plasma Cell Myeloma and Plasma Cell Disorders, AJCC 8th Edition - Clinical: No stage assigned - Unsigned - Clinical: RISS Stage I (Beta-2-microglobulin (mg/L): 2.4, Albumin (g/dL): 3.5, ISS: Stage I, High-risk cytogenetics: Absent, LDH: Normal) - Signed by [name], MD on 06/19/2023 Beta 2 microglobulin range (mg/L): Less than 3.5 Albumin range (g/dL): Greater than or equal to 3.5 Cytogenetics: No abnormalities  Oncology History Overview Note   # April 2014- MULTIPLE MYELOMA [IgG- 2.2gm/dl; 11-91% plasma cell; Cyto-N; FISH- gain of chr.9, 15 & ccnd1/11q13] s/p RVD x6 [sep 2014; BMBx- ~5% plasma cells]; Maint Rev-DEX-Zometa ; March 2015-Stopped Dex Cont- Rev-Zometa ; July 2016- Rev 25mg ; NOV 4th  2016-HOLD; BMT eval at [facility] [Dr.Woods]; declined BMT.   # Re-START FEB 2017 Rev 15mg  3 W- on & 1 w OFF; HELD Rev June- Oct 2020- sec to worsening renal function [Dr.Singh]; SEP 2021- Rev 15 mg 2 w-On & 2 w-OFF. STOP REVLIMD in APRIL 2025-progression.    # APRIL 2025- . Diffuse lytic myelomatous lesions throughout the skeleton. Most of these lesions demonstrate hypermetabolism; Healing left mid clavicle fracture, likely pathologic; Early nondisplaced pathological fracture involving the right mid clavicle; Left pubic bone lesion has a defect in the outer cortex and this could be a pending pathologic fracture; . No findings for extraskeletal myeloma. Bone marrow Bx- on APRIL 25th, 2025- Pending.  # April 2025 discontinue lenalidomide .  # APRIL 29th, 2025- start Dara-Velcade  -Dex   # Zometa   # CKD [creat ~1.4; sec MM; Dr.Singh];  DM  -------------------------------------------------------------------------   DIAGNOSIS: [name] ] MULTIPLE MYELOMA  GOALS: control     Multiple myeloma in remission (HCC)  10/01/2015 Initial Diagnosis   Multiple myeloma in remission (HCC)   Multiple myeloma in relapse (HCC)  05/14/2023 Initial Diagnosis   Multiple myeloma in relapse (HCC)   06/05/2023 -  Chemotherapy   Patient is on Treatment Plan : MYELOMA RELAPSED / REFRACTORY Daratumumab  SQ + Bortezomib  + Dexamethasone  (DaraVd) q21d / Daratumumab  SQ q28d      06/05/2023 Cancer Staging   Staging form: Plasma Cell Myeloma and Plasma Cell Disorders, AJCC 8th Edition - Clinical: RISS Stage I (Beta-2-microglobulin (mg/L): 2.4, Albumin (g/dL): 3.5, ISS: Stage I, High-risk cytogenetics: Absent, LDH: Normal) - Signed by [name], MD on 06/19/2023 Beta 2 microglobulin range (mg/L): Less than 3.5 Albumin range (g/dL): Greater than or equal to 3.5 Cytogenetics: No abnormalities    INTERVAL HISTORY: Accompanied with wife.  Ambulating independently.   Alan Alexander 78 y.o. male pleasant patient above history of recurrent multiple myeloma who returns to clinic for consideration of continuation of chemotherapy. He is feeling well. Denies complaints. Pain stable. Has some redness around injection site but mild and not bothersome.   Review of Systems  Constitutional:  Negative for chills, diaphoresis, fever, malaise/fatigue and weight loss.  HENT:  Negative for nosebleeds and sore throat.   Eyes:  Negative for double vision.  Respiratory:  Negative for cough, hemoptysis, sputum production, shortness of breath and wheezing.   Cardiovascular:  Positive for leg swelling. Negative for chest pain, palpitations and orthopnea.  Gastrointestinal:  Negative for abdominal pain, blood in stool, constipation, diarrhea, heartburn, melena, nausea and vomiting.  Genitourinary:  Negative for dysuria,  frequency and urgency.  Musculoskeletal:   Positive for back pain and joint pain.  Skin: Negative.  Negative for itching and rash.  Neurological:  Negative for dizziness, tingling, focal weakness, weakness and headaches.  Endo/Heme/Allergies:  Does not bruise/bleed easily.  Psychiatric/Behavioral:  Negative for depression. The patient is not nervous/anxious and does not have insomnia.    PAST MEDICAL HISTORY :  Past Medical History:  Diagnosis Date   Diabetes mellitus without complication (HCC)    GERD (gastroesophageal reflux disease)    Hyperlipidemia    Hypertension    Multiple myeloma (HCC)    Obesity    Thyroid cancer (HCC)    Thyroid disease    PAST SURGICAL HISTORY :   Past Surgical History:  Procedure Laterality Date   BONE MARROW BIOPSY  2014   CATARACT EXTRACTION BILATERAL W/ ANTERIOR VITRECTOMY     IR BONE MARROW BIOPSY & ASPIRATION  06/01/2023   FAMILY HISTORY :   Family History  Problem Relation Age of Onset   Cancer Father 33   Diabetes Mother    SOCIAL HISTORY:   Social History   Tobacco Use   Smoking status: Never   Smokeless tobacco: Never  Substance Use Topics   Alcohol use: No   Drug use: No   ALLERGIES:  has no known allergies.  MEDICATIONS:  Current Outpatient Medications  Medication Sig Dispense Refill   acyclovir  (ZOVIRAX ) 400 MG tablet Take 1 tablet (400 mg total) by mouth 2 (two) times daily. 120 tablet 2   amLODipine  (NORVASC ) 10 MG tablet Take 10 mg by mouth daily.     aspirin EC 81 MG tablet Take 81 mg by mouth daily.     atorvastatin  (LIPITOR) 20 MG tablet Take 20 mg by mouth daily.     chlorproMAZINE  (THORAZINE ) 25 MG tablet Take 1 tablet (25 mg total) by mouth 3 (three) times daily. 60 tablet 1   Cyanocobalamin 1000 MCG TBCR Take by mouth.     cyclobenzaprine  (FLEXERIL ) 5 MG tablet Take 1 tablet (5 mg total) by mouth 3 (three) times daily as needed. 15 tablet 0   dapagliflozin propanediol (FARXIGA) 10 MG TABS tablet TAKE 10 MG BY MOUTH 1 (ONE) TIME EACH DAY IN THE MORNING      ergocalciferol  (VITAMIN D2) 1.25 MG (50000 UT) capsule Take 1 capsule (50,000 Units total) by mouth once a week. 12 capsule 1   glipiZIDE (GLUCOTROL) 5 MG tablet Take 5 mg by mouth every morning.     levothyroxine  (SYNTHROID ) 137 MCG tablet Take 1 tablet by mouth daily.      ondansetron  (ZOFRAN ) 8 MG tablet One pill every 8 hours as needed for nausea/vomitting. 40 tablet 1   ONETOUCH DELICA LANCETS 33G MISC Inject 1 Lancet as directed once daily.     potassium chloride  SA (K-DUR,KLOR-CON ) 20 MEQ tablet Take 20 mEq by mouth 2 (two) times daily.   0   prochlorperazine  (COMPAZINE ) 10 MG tablet Take 1 tablet (10 mg total) by mouth every 6 (six) hours as needed for nausea or vomiting. 40 tablet 1   sodium bicarbonate 650 MG tablet Take 650 mg by mouth 2 (two) times daily.     traMADol  (ULTRAM ) 50 MG tablet Take 1 tablet (50 mg total) by mouth every 6 (six) hours as needed. 45 tablet 0   No current facility-administered medications for this visit.    PHYSICAL EXAMINATION: ECOG PERFORMANCE STATUS: 0 - Asymptomatic  BP 133/78 (BP Location: Left Arm, Patient Position: Sitting, Cuff  Size: Large)   Pulse 89   Temp (!) 97.3 F (36.3 C) (Tympanic)   Resp 18   Wt 199 lb (90.3 kg)   SpO2 100%   BMI 28.55 kg/m   Filed Weights   07/03/23 1325  Weight: 199 lb (90.3 kg)   Physical Exam Vitals reviewed.  Constitutional:      Appearance: He is not ill-appearing.  HENT:     Head: Normocephalic and atraumatic.     Mouth/Throat:     Pharynx: Oropharynx is clear. No oropharyngeal exudate.  Eyes:     Extraocular Movements: Extraocular movements intact.     Pupils: Pupils are equal, round, and reactive to light.  Cardiovascular:     Rate and Rhythm: Normal rate and regular rhythm.  Pulmonary:     Effort: No respiratory distress.     Breath sounds: No wheezing.     Comments: Decreased breath sounds bilaterally.  Abdominal:     General: There is no distension.     Palpations: Abdomen is soft.      Tenderness: There is no abdominal tenderness. There is no guarding.  Musculoskeletal:        General: No tenderness. Normal range of motion.  Skin:    General: Skin is warm.     Coloration: Skin is not pale.     Comments: Redness of left lower abdomen.   Neurological:     Mental Status: He is alert and oriented to person, place, and time.  Psychiatric:        Mood and Affect: Mood and affect normal.        Behavior: Behavior normal.    LABORATORY DATA:  I have reviewed the data as listed    Component Value Date/Time   NA 138 07/03/2023 1257   NA 139 05/01/2014 1322   K 3.1 (L) 07/03/2023 1257   K 4.4 05/01/2014 1322   CL 99 07/03/2023 1257   CL 107 05/01/2014 1322   CO2 29 07/03/2023 1257   CO2 27 05/01/2014 1322   GLUCOSE 176 (H) 07/03/2023 1257   GLUCOSE 102 (H) 05/01/2014 1322   BUN 17 07/03/2023 1257   BUN 16 05/01/2014 1322   CREATININE 1.49 (H) 07/03/2023 1257   CREATININE 1.48 (H) 05/29/2014 0949   CALCIUM 7.7 (L) 07/03/2023 1257   CALCIUM 9.1 05/01/2014 1322   PROT 6.5 07/03/2023 1257   PROT 7.5 05/01/2014 1322   ALBUMIN 3.7 07/03/2023 1257   ALBUMIN 3.6 05/01/2014 1322   AST 17 07/03/2023 1257   ALT 24 07/03/2023 1257   ALT 13 (L) 05/01/2014 1322   ALKPHOS 96 07/03/2023 1257   ALKPHOS 47 05/01/2014 1322   BILITOT 0.7 07/03/2023 1257   GFRNONAA 47 (L) 07/03/2023 1257   GFRNONAA 47 (L) 05/29/2014 0949   GFRAA 47 (L) 10/21/2019 1106   GFRAA 55 (L) 05/29/2014 0949   Lab Results  Component Value Date   WBC 4.7 07/03/2023   NEUTROABS 3.9 07/03/2023   HGB 13.9 07/03/2023   HCT 42.9 07/03/2023   MCV 92.7 07/03/2023   PLT 214 07/03/2023     Chemistry      Component Value Date/Time   NA 138 07/03/2023 1257   NA 139 05/01/2014 1322   K 3.1 (L) 07/03/2023 1257   K 4.4 05/01/2014 1322   CL 99 07/03/2023 1257   CL 107 05/01/2014 1322   CO2 29 07/03/2023 1257   CO2 27 05/01/2014 1322   BUN 17 07/03/2023 1257   BUN  16 05/01/2014 1322   CREATININE 1.49  (H) 07/03/2023 1257   CREATININE 1.48 (H) 05/29/2014 0949      Component Value Date/Time   CALCIUM 7.7 (L) 07/03/2023 1257   CALCIUM 9.1 05/01/2014 1322   ALKPHOS 96 07/03/2023 1257   ALKPHOS 47 05/01/2014 1322   AST 17 07/03/2023 1257   ALT 24 07/03/2023 1257   ALT 13 (L) 05/01/2014 1322   BILITOT 0.7 07/03/2023 1257     RADIOGRAPHIC STUDIES: I have personally reviewed the radiological images as listed and agreed with the findings in the report. No results found.   ASSESSMENT & PLAN:   Multiple myeloma in relapse  # Multiple myeloma-most recently on maintenance Revlimid  15 mg 2  weeks on 2 week off.  JAN 2025-  M-protein- 1.3[April 2023- NEG]; kappa=113 lambda light chain ratio=2.5 [July 2020- 2.0]. RISING- ca- 10.4. # APRIL 2025- . Diffuse lytic myelomatous lesions throughout the skeleton. Most of these lesions demonstrate hypermetabolism; Healing left mid clavicle fracture, likely pathologic; Early nondisplaced pathological fracture involving the right mid clavicle; Left pubic bone lesion has a defect in the outer cortex and this could be a pending pathologic fracture; . No findings for extraskeletal myeloma. Bone marrow Bx- on APRIL 25th, 2025- Pending results.  April 2025 discontinue lenalidomide . On Dara Velcade  dexamethasone .     # Labs reviewed and acceptable for treatment. Proceed with D8C2 dara-velcade -dex. June 2nd, 2025- awaiting [facility] consideration re:CAR-T therapy/stem cell consolidation therapy.    # GI: nausea and vomiting; and diarrhea 2-3 days/hiccups/weight loss- improved- continue 20 mg of dexamethasone .   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 Aaron Aas April 2024- Vit D 38- recommend Vit D 50, K/weekly- CONTINUE TO HOLD demadex ; HOLD QUINAPRIL - recommend bringing medications at next visit.    # Acute back pain: Progressive bone lesions on PET scan April 2025-status post Zometa  improved with  tramadol .    # DM- on OHA -recommend close monitoring of blood glucose levels. Recommend bringing log at this time.  stable   # Hypothyroidism: on synthroid - check thyroid profile- stable   # infection prophylaxis: Continue  acyclovir  twice a day.  stable   # ACP [06/05/2023]:  full code.    Cycle 1 & 2- velcade  twice weekly Cycle 3 & 4- Velcade  q weekly Cycles 5-8- velcade  q3w Cycles 9+- Dara q28 days, velcade   Zometa - q77m- last 06/12/23  Needs dex script every other week   PS  Disposition: Chemo today F/u as scheduled- la  No problem-specific Assessment & Plan notes found for this encounter.  No orders of the defined types were placed in this encounter.  All questions were answered. The patient knows to call the clinic with any problems, questions or concerns.    Nelda Balsam, NP 07/03/2023

## 2023-07-03 NOTE — Progress Notes (Signed)
 Patient needs refills on his Zofran , and his pain medication tramadol . He is doing well.

## 2023-07-04 ENCOUNTER — Other Ambulatory Visit: Payer: Self-pay

## 2023-07-04 LAB — KAPPA/LAMBDA LIGHT CHAINS
Kappa free light chain: 14.1 mg/L (ref 3.3–19.4)
Kappa, lambda light chain ratio: 2.04 — ABNORMAL HIGH (ref 0.26–1.65)
Lambda free light chains: 6.9 mg/L (ref 5.7–26.3)

## 2023-07-05 ENCOUNTER — Encounter: Payer: Self-pay | Admitting: Internal Medicine

## 2023-07-05 LAB — MULTIPLE MYELOMA PANEL, SERUM
Albumin SerPl Elph-Mcnc: 3.2 g/dL (ref 2.9–4.4)
Albumin/Glob SerPl: 1.2 (ref 0.7–1.7)
Alpha 1: 0.3 g/dL (ref 0.0–0.4)
Alpha2 Glob SerPl Elph-Mcnc: 0.8 g/dL (ref 0.4–1.0)
B-Globulin SerPl Elph-Mcnc: 1 g/dL (ref 0.7–1.3)
Gamma Glob SerPl Elph-Mcnc: 0.7 g/dL (ref 0.4–1.8)
Globulin, Total: 2.8 g/dL (ref 2.2–3.9)
IgA: 117 mg/dL (ref 61–437)
IgG (Immunoglobin G), Serum: 811 mg/dL (ref 603–1613)
IgM (Immunoglobulin M), Srm: 43 mg/dL (ref 15–143)
M Protein SerPl Elph-Mcnc: 0.4 g/dL — ABNORMAL HIGH
Total Protein ELP: 6 g/dL (ref 6.0–8.5)

## 2023-07-06 ENCOUNTER — Inpatient Hospital Stay

## 2023-07-06 ENCOUNTER — Other Ambulatory Visit: Payer: Self-pay

## 2023-07-06 VITALS — BP 136/79 | HR 82 | Temp 97.4°F | Resp 18

## 2023-07-06 DIAGNOSIS — Z5111 Encounter for antineoplastic chemotherapy: Secondary | ICD-10-CM | POA: Diagnosis not present

## 2023-07-06 DIAGNOSIS — C9002 Multiple myeloma in relapse: Secondary | ICD-10-CM

## 2023-07-06 MED ORDER — DEXAMETHASONE 4 MG PO TABS
10.0000 mg | ORAL_TABLET | Freq: Once | ORAL | Status: AC
  Administered 2023-07-06: 10 mg via ORAL
  Filled 2023-07-06: qty 3

## 2023-07-06 MED ORDER — BORTEZOMIB CHEMO SQ INJECTION 3.5 MG (2.5MG/ML)
1.3000 mg/m2 | Freq: Once | INTRAMUSCULAR | Status: AC
  Administered 2023-07-06: 2.75 mg via SUBCUTANEOUS
  Filled 2023-07-06: qty 1.1

## 2023-07-06 NOTE — Patient Instructions (Signed)
 [name] - A DEPT OF [facility]  Discharge Instructions: Thank you for choosing [facility] to provide your oncology and hematology care.  If you have a lab appointment with the Cancer Center, please go directly to the Cancer Center and check in at the registration area.  Wear comfortable clothing and clothing appropriate for easy access to any Portacath or PICC line.   We strive to give you quality time with your provider. You may need to reschedule your appointment if you arrive late (15 or more minutes).  Arriving late affects you and other patients whose appointments are after yours.  Also, if you miss three or more appointments without notifying the office, you may be dismissed from the clinic at the provider's discretion.      For prescription refill requests, have your pharmacy contact our office and allow 72 hours for refills to be completed.    Today you received the following chemotherapy and/or immunotherapy agents Velcade      To help prevent nausea and vomiting after your treatment, we encourage you to take your nausea medication as directed.  BELOW ARE SYMPTOMS THAT SHOULD BE REPORTED IMMEDIATELY: *FEVER GREATER THAN 100.4 F (38 C) OR HIGHER *CHILLS OR SWEATING *NAUSEA AND VOMITING THAT IS NOT CONTROLLED WITH YOUR NAUSEA MEDICATION *UNUSUAL SHORTNESS OF BREATH *UNUSUAL BRUISING OR BLEEDING *URINARY PROBLEMS (pain or burning when urinating, or frequent urination) *BOWEL PROBLEMS (unusual diarrhea, constipation, pain near the anus) TENDERNESS IN MOUTH AND THROAT WITH OR WITHOUT PRESENCE OF ULCERS (sore throat, sores in mouth, or a toothache) UNUSUAL RASH, SWELLING OR PAIN  UNUSUAL VAGINAL DISCHARGE OR ITCHING   Items with * indicate a potential emergency and should be followed up as soon as possible or go to the Emergency Department if any problems should occur.  Please show the CHEMOTHERAPY ALERT CARD or IMMUNOTHERAPY  ALERT CARD at check-in to the Emergency Department and triage nurse.  Should you have questions after your visit or need to cancel or reschedule your appointment, please contact [name] - A DEPT OF [facility]  [phone] and follow the prompts.  Office hours are 8:00 a.m. to 4:30 p.m. Monday - Friday. Please note that voicemails left after 4:00 p.m. may not be returned until the following business day.  We are closed weekends and major holidays. You have access to a nurse at all times for urgent questions. Please call the main number to the clinic [phone] and follow the prompts.  For any non-urgent questions, you may also contact your provider using MyChart. We now offer e-Visits for anyone 19 and older to request care online for non-urgent symptoms. For details visit [email or web address].   Also download the MyChart app! Go to the app store, search "MyChart", open the app, select [place], and log in with your MyChart username and password.

## 2023-07-06 NOTE — Progress Notes (Signed)
 Nutrition Follow-up:  Patient with multiple myeloma.  Receiving daratumumab , velcade , dexamethasone   Met with patient during infusion.  Reports that appetite is pretty good.  Reports loose stools at times.  Tried glucerna shake and said it was really sweet.  Drinks one every other day.  Reports that he had chicken, green beans for supper last night.  Ate scrambled eggs, ham, toast and tomato for breakfast this am.    Medications: reviewed  Labs: K 3.1  Anthropometrics:   Weight 199 lb  212 lb on 5/6 240 lb on 02/12/23   NUTRITION DIAGNOSIS: Inadequate oral intake stable    INTERVENTION:  Discussed trying glucerna mixed with milk to decrease sweetness Patient interested in foods that are healthy. Discussed plant foods and lean meats     MONITORING, EVALUATION, GOAL: weight trends, intake   NEXT VISIT: as needed  Kymber Kosar B. Zollie Hipp, CSO, LDN Registered Dietitian [phone]

## 2023-07-10 ENCOUNTER — Inpatient Hospital Stay (HOSPITAL_BASED_OUTPATIENT_CLINIC_OR_DEPARTMENT_OTHER): Admitting: Internal Medicine

## 2023-07-10 ENCOUNTER — Inpatient Hospital Stay: Attending: Internal Medicine

## 2023-07-10 ENCOUNTER — Inpatient Hospital Stay

## 2023-07-10 ENCOUNTER — Encounter: Payer: Self-pay | Admitting: Internal Medicine

## 2023-07-10 VITALS — BP 152/97 | HR 85 | Temp 95.4°F | Resp 16 | Ht 70.0 in | Wt 205.5 lb

## 2023-07-10 DIAGNOSIS — R112 Nausea with vomiting, unspecified: Secondary | ICD-10-CM | POA: Diagnosis not present

## 2023-07-10 DIAGNOSIS — Z79899 Other long term (current) drug therapy: Secondary | ICD-10-CM | POA: Diagnosis not present

## 2023-07-10 DIAGNOSIS — R197 Diarrhea, unspecified: Secondary | ICD-10-CM | POA: Insufficient documentation

## 2023-07-10 DIAGNOSIS — Z5112 Encounter for antineoplastic immunotherapy: Secondary | ICD-10-CM | POA: Insufficient documentation

## 2023-07-10 DIAGNOSIS — E876 Hypokalemia: Secondary | ICD-10-CM | POA: Insufficient documentation

## 2023-07-10 DIAGNOSIS — N183 Chronic kidney disease, stage 3 unspecified: Secondary | ICD-10-CM | POA: Diagnosis not present

## 2023-07-10 DIAGNOSIS — E1122 Type 2 diabetes mellitus with diabetic chronic kidney disease: Secondary | ICD-10-CM | POA: Insufficient documentation

## 2023-07-10 DIAGNOSIS — C9002 Multiple myeloma in relapse: Secondary | ICD-10-CM | POA: Diagnosis not present

## 2023-07-10 DIAGNOSIS — E039 Hypothyroidism, unspecified: Secondary | ICD-10-CM | POA: Diagnosis not present

## 2023-07-10 DIAGNOSIS — Z5111 Encounter for antineoplastic chemotherapy: Secondary | ICD-10-CM | POA: Insufficient documentation

## 2023-07-10 LAB — CBC WITH DIFFERENTIAL (CANCER CENTER ONLY)
Abs Immature Granulocytes: 0.09 10*3/uL — ABNORMAL HIGH (ref 0.00–0.07)
Basophils Absolute: 0 10*3/uL (ref 0.0–0.1)
Basophils Relative: 0 %
Eosinophils Absolute: 0 10*3/uL (ref 0.0–0.5)
Eosinophils Relative: 0 %
HCT: 38.5 % — ABNORMAL LOW (ref 39.0–52.0)
Hemoglobin: 12.8 g/dL — ABNORMAL LOW (ref 13.0–17.0)
Immature Granulocytes: 2 %
Lymphocytes Relative: 6 %
Lymphs Abs: 0.4 10*3/uL — ABNORMAL LOW (ref 0.7–4.0)
MCH: 31.1 pg (ref 26.0–34.0)
MCHC: 33.2 g/dL (ref 30.0–36.0)
MCV: 93.4 fL (ref 80.0–100.0)
Monocytes Absolute: 0.6 10*3/uL (ref 0.1–1.0)
Monocytes Relative: 10 %
Neutro Abs: 5.1 10*3/uL (ref 1.7–7.7)
Neutrophils Relative %: 82 %
Platelet Count: 86 10*3/uL — ABNORMAL LOW (ref 150–400)
RBC: 4.12 MIL/uL — ABNORMAL LOW (ref 4.22–5.81)
RDW: 16.5 % — ABNORMAL HIGH (ref 11.5–15.5)
WBC Count: 6.2 10*3/uL (ref 4.0–10.5)
nRBC: 1 % — ABNORMAL HIGH (ref 0.0–0.2)

## 2023-07-10 LAB — CMP (CANCER CENTER ONLY)
ALT: 21 U/L (ref 0–44)
AST: 15 U/L (ref 15–41)
Albumin: 3.5 g/dL (ref 3.5–5.0)
Alkaline Phosphatase: 90 U/L (ref 38–126)
Anion gap: 10 (ref 5–15)
BUN: 15 mg/dL (ref 8–23)
CO2: 24 mmol/L (ref 22–32)
Calcium: 8.3 mg/dL — ABNORMAL LOW (ref 8.9–10.3)
Chloride: 104 mmol/L (ref 98–111)
Creatinine: 1.25 mg/dL — ABNORMAL HIGH (ref 0.61–1.24)
GFR, Estimated: 59 mL/min — ABNORMAL LOW (ref >60)
Glucose, Bld: 166 mg/dL — ABNORMAL HIGH (ref 70–99)
Potassium: 3.4 mmol/L — ABNORMAL LOW (ref 3.5–5.1)
Sodium: 138 mmol/L (ref 135–145)
Total Bilirubin: 0.8 mg/dL (ref 0.0–1.2)
Total Protein: 6.1 g/dL — ABNORMAL LOW (ref 6.5–8.1)

## 2023-07-10 MED ORDER — DEXAMETHASONE 4 MG PO TABS
20.0000 mg | ORAL_TABLET | Freq: Once | ORAL | Status: AC
  Administered 2023-07-10: 20 mg via ORAL
  Filled 2023-07-10: qty 5

## 2023-07-10 MED ORDER — DIPHENHYDRAMINE HCL 25 MG PO CAPS
50.0000 mg | ORAL_CAPSULE | Freq: Once | ORAL | Status: AC
  Administered 2023-07-10: 50 mg via ORAL
  Filled 2023-07-10: qty 2

## 2023-07-10 MED ORDER — DARATUMUMAB-HYALURONIDASE-FIHJ 1800-30000 MG-UT/15ML ~~LOC~~ SOLN
1800.0000 mg | Freq: Once | SUBCUTANEOUS | Status: AC
  Administered 2023-07-10: 1800 mg via SUBCUTANEOUS
  Filled 2023-07-10: qty 15

## 2023-07-10 MED ORDER — ACETAMINOPHEN 325 MG PO TABS
650.0000 mg | ORAL_TABLET | Freq: Once | ORAL | Status: AC
  Administered 2023-07-10: 650 mg via ORAL
  Filled 2023-07-10: qty 2

## 2023-07-10 NOTE — Progress Notes (Signed)
 [facility] OFFICE PROGRESS NOTE  Patient Care Team: [name], Alan as PCP - General (Internal Medicine) [name], Alan Alexander, Alan Alexander, Alan Alexander - Unsigned - Clinical: RISS Stage I (Beta-2-microglobulin (mg/L): 2.4, Albumin (g/dL): 3.5, ISS: Stage I, High-risk cytogenetics: Absent, LDH: Normal) - Signed by [name], Alan on 06/19/2023 Beta 2 microglobulin range (mg/L): Less than 3.5 Albumin range (g/dL): Greater than or equal to 3.5 Cytogenetics: No abnormalities    Oncology History Overview Note   # April 2014- MULTIPLE MYELOMA [IgG- 2.2gm/dl; 16-10% plasma cell; Cyto-N; FISH- gain of chr.9, 15 & ccnd1/11q13] s/p RVD x6 [sep 2014; BMBx- ~5% plasma cells]; Maint Rev-DEX-Zometa ; March 2015-Stopped Dex Cont- Rev-Zometa ; July 2016- Rev 25mg ; NOV 4th  2016-HOLD; BMT eval at [facility] [Dr.Woods]; declined BMT.   # Re-START FEB 2017 Rev 15mg  3 W- on & 1 w OFF; HELD Rev June- Oct 2020- sec to worsening renal function [Dr.Singh]; SEP 2021- Rev 15 mg 2 w-On & 2 w-OFF. STOP REVLIMD in APRIL 2025-progression.    # APRIL 2025- . Diffuse lytic myelomatous lesions throughout the skeleton. Most of these lesions demonstrate hypermetabolism; Healing left mid clavicle fracture, likely pathologic; Early nondisplaced pathological fracture involving the right mid clavicle; Left pubic bone lesion has a defect in the outer cortex and this could be a pending pathologic fracture; . No findings for extraskeletal myeloma. Bone marrow Bx- on APRIL 25th, 2025- Pending.  # April 2025 discontinue lenalidomide .  # APRIL 29th, 2025- start Dara-Velcade  -Dex   # Zometa   # CKD [creat ~1.4; sec MM; Dr.Singh];  DM  -------------------------------------------------------------------------   DIAGNOSIS: Alan.Alexander ] MULTIPLE MYELOMA  GOALS: control     Multiple myeloma in remission (HCC)  10/01/2015 Initial Diagnosis   Multiple myeloma in remission (HCC)   Multiple myeloma in relapse (HCC)  05/14/2023 Initial Diagnosis   Multiple myeloma in relapse (HCC)   06/05/2023 -  Chemotherapy   Patient is on Treatment Plan : MYELOMA RELAPSED / REFRACTORY Daratumumab  SQ + Bortezomib  + Dexamethasone  (DaraVd) q21d / Daratumumab  SQ q28d      06/05/2023 Cancer Staging   Staging form: Plasma Cell Myeloma and Plasma Cell Alexander, Alan 8th Edition - Clinical: RISS Stage I (Beta-2-microglobulin (mg/L): 2.4, Albumin (g/dL): 3.5, ISS: Stage I, High-risk cytogenetics: Absent, LDH: Normal) - Signed by [name], Alan on 06/19/2023 Beta 2 microglobulin range (mg/L): Less than 3.5 Albumin range (g/dL): Greater than or equal to 3.5 Cytogenetics: No abnormalities    INTERVAL HISTORY: pt is  with wife.  Ambulating independently.   Alan Alexander 78 y.o.  male pleasant patient above history of recurrent multiple myeloma  is to  proceed with chemotherapy.   Patient status post evaluation at [facility] for consideration of CAR-T therapy.  Improved hiccups. Patient also started on tramadol  1 pills prn twice a day. Noted to have improvement of his hip pain. Patient denies any skin rash. No swelling in the legs.  Patient also denies any tingling or numbness.  Review of Systems  Constitutional:  Negative for chills, diaphoresis, fever, malaise/fatigue and weight loss.  HENT:  Negative for nosebleeds and sore throat.   Eyes:  Negative for double vision.  Respiratory:  Negative for cough, hemoptysis, sputum production, shortness of breath and wheezing.   Cardiovascular:  Positive for leg swelling. Negative for chest pain, palpitations and orthopnea.  Gastrointestinal:  Negative for abdominal pain, blood in stool, constipation,  diarrhea, heartburn, melena, nausea and vomiting.  Genitourinary:  Negative for dysuria, frequency and urgency.  Musculoskeletal:  Positive for back pain and joint pain.  Skin: Negative.  Negative for itching and rash.  Neurological:  Negative for dizziness, tingling, focal weakness, weakness and headaches.  Endo/Heme/Allergies:  Does not bruise/bleed easily.  Psychiatric/Behavioral:  Negative for depression. The patient is not nervous/anxious and does not have insomnia.       PAST MEDICAL HISTORY :  Past Medical History:  Diagnosis Date   Diabetes mellitus without complication (HCC)    GERD (gastroesophageal reflux disease)    Hyperlipidemia    Hypertension    Multiple myeloma (HCC)    Obesity    Thyroid cancer (HCC)    Thyroid disease     PAST SURGICAL HISTORY :   Past Surgical History:  Procedure Laterality Date   BONE MARROW BIOPSY  2014   CATARACT EXTRACTION BILATERAL W/ ANTERIOR VITRECTOMY     IR BONE MARROW BIOPSY & ASPIRATION  06/01/2023    FAMILY HISTORY :   Family History  Problem Relation Age of Onset   Cancer Father 71   Diabetes Mother     SOCIAL HISTORY:   Social History   Tobacco Use   Smoking status: Never   Smokeless tobacco: Never  Substance Use Topics   Alcohol use: No   Drug use: No    ALLERGIES:  has no known allergies.  MEDICATIONS:  Current Outpatient Medications  Medication Sig Dispense Refill   acyclovir  (ZOVIRAX ) 400 MG tablet Take 1 tablet (400 mg total) by mouth 2 (two) times daily. 120 tablet 2   amLODipine  (NORVASC ) 10 MG tablet Take 10 mg by mouth daily.     aspirin EC 81 MG tablet Take 81 mg by mouth daily.     atorvastatin  (LIPITOR) 20 MG tablet Take 20 mg by mouth daily.     chlorproMAZINE  (THORAZINE ) 25 MG tablet Take 1 tablet (25 mg total) by mouth 3 (three) times daily. 60 tablet 1   Cyanocobalamin 1000 MCG TBCR Take by mouth.     cyclobenzaprine  (FLEXERIL ) 5 MG tablet Take 1 tablet (5 mg total) by mouth 3 (three) times  daily as needed. 15 tablet 0   dapagliflozin propanediol (FARXIGA) 10 MG TABS tablet TAKE 10 MG BY MOUTH 1 (ONE) TIME EACH DAY IN THE MORNING     ergocalciferol  (VITAMIN D2) 1.25 MG (50000 UT) capsule Take 1 capsule (50,000 Units total) by mouth once a week. 12 capsule 1   glipiZIDE (GLUCOTROL) 5 MG tablet Take 5 mg by mouth every morning.     levothyroxine  (SYNTHROID ) 137 MCG tablet Take 1 tablet by mouth daily.      ondansetron  (ZOFRAN ) 8 MG tablet One pill every 8 hours as needed for nausea/vomitting. 40 tablet 1   ONETOUCH DELICA LANCETS 33G MISC Inject 1 Lancet as directed once daily.     potassium chloride  SA (K-DUR,KLOR-CON ) 20 MEQ tablet Take 20 mEq by mouth 2 (two) times daily.   0   prochlorperazine  (COMPAZINE ) 10 MG tablet Take 1 tablet (10 mg total) by mouth every 6 (six) hours as needed for nausea or vomiting. 40 tablet 1   sodium bicarbonate 650 MG tablet Take 650 mg by mouth 2 (two) times daily.     traMADol  (ULTRAM ) 50 MG tablet Take 1 tablet (50 mg total) by  mouth every 6 (six) hours as needed. 45 tablet 0   No current facility-administered medications for this visit.   Facility-Administered Medications Ordered in Other Visits  Medication Dose Route Frequency Provider Last Rate Last Admin   daratumumab -hyaluronidase -fihj (DARZALEX  FASPRO) 1800-30000 MG-UT/15ML chemo SQ injection 1,800 mg  1,800 mg Subcutaneous Once Honesty Menta R, Alan        PHYSICAL EXAMINATION: ECOG PERFORMANCE STATUS: 0 - Asymptomatic  BP (!) 152/97 (BP Location: Left Arm, Patient Position: Sitting, Cuff Size: Large) Comment: pt told bp elevated, keep check at home, contact pcp if con't to stay elevated  Pulse 85   Temp (!) 95.4 F (35.2 C) (Temporal)   Resp 16   Ht 5\' 10"  (1.778 m)   Wt 205 lb 8 oz (93.2 kg)   SpO2 99%   BMI 29.49 kg/m   Filed Weights   07/10/23 0917  Weight: 205 lb 8 oz (93.2 kg)      Physical Exam Vitals and nursing note reviewed.  HENT:     Head: Normocephalic  and atraumatic.     Mouth/Throat:     Pharynx: Oropharynx is clear. No oropharyngeal exudate.  Eyes:     Extraocular Movements: Extraocular movements intact.     Pupils: Pupils are equal, round, and reactive to light.  Cardiovascular:     Rate and Rhythm: Normal rate and regular rhythm.  Pulmonary:     Effort: No respiratory distress.     Breath sounds: No wheezing.     Comments: Decreased breath sounds bilaterally.  Abdominal:     General: Bowel sounds are normal. There is no distension.     Palpations: Abdomen is soft. There is no mass.     Tenderness: There is no abdominal tenderness. There is no guarding or rebound.  Musculoskeletal:        General: No tenderness. Normal range of motion.     Cervical back: Normal range of motion and neck supple.  Skin:    General: Skin is warm.  Neurological:     General: No focal deficit present.     Mental Status: He is alert and oriented to person, place, and time.  Psychiatric:        Mood and Affect: Affect normal.        Behavior: Behavior normal.        Judgment: Judgment normal.       LABORATORY DATA:  I have reviewed the data as listed    Component Value Date/Time   NA 138 07/10/2023 0920   NA 139 05/01/2014 1322   K 3.4 (L) 07/10/2023 0920   K 4.4 05/01/2014 1322   CL 104 07/10/2023 0920   CL 107 05/01/2014 1322   CO2 24 07/10/2023 0920   CO2 27 05/01/2014 1322   GLUCOSE 166 (H) 07/10/2023 0920   GLUCOSE 102 (H) 05/01/2014 1322   BUN 15 07/10/2023 0920   BUN 16 05/01/2014 1322   CREATININE 1.25 (H) 07/10/2023 0920   CREATININE 1.48 (H) 05/29/2014 0949   CALCIUM 8.3 (L) 07/10/2023 0920   CALCIUM 9.1 05/01/2014 1322   PROT 6.1 (L) 07/10/2023 0920   PROT 7.5 05/01/2014 1322   ALBUMIN 3.5 07/10/2023 0920   ALBUMIN 3.6 05/01/2014 1322   AST 15 07/10/2023 0920   ALT 21 07/10/2023 0920   ALT 13 (L) 05/01/2014 1322   ALKPHOS 90 07/10/2023 0920   ALKPHOS 47 05/01/2014 1322   BILITOT 0.8 07/10/2023 0920   GFRNONAA 59  (L) 07/10/2023 0920  GFRNONAA 47 (L) 05/29/2014 0949   GFRAA 47 (L) 10/21/2019 1106   GFRAA 55 (L) 05/29/2014 0949    No results found for: "SPEP", "UPEP"  Lab Results  Component Value Date   WBC 6.2 07/10/2023   NEUTROABS 5.1 07/10/2023   HGB 12.8 (L) 07/10/2023   HCT 38.5 (L) 07/10/2023   MCV 93.4 07/10/2023   PLT 86 (L) 07/10/2023      Chemistry      Component Value Date/Time   NA 138 07/10/2023 0920   NA 139 05/01/2014 1322   K 3.4 (L) 07/10/2023 0920   K 4.4 05/01/2014 1322   CL 104 07/10/2023 0920   CL 107 05/01/2014 1322   CO2 24 07/10/2023 0920   CO2 27 05/01/2014 1322   BUN 15 07/10/2023 0920   BUN 16 05/01/2014 1322   CREATININE 1.25 (H) 07/10/2023 0920   CREATININE 1.48 (H) 05/29/2014 0949      Component Value Date/Time   CALCIUM 8.3 (L) 07/10/2023 0920   CALCIUM 9.1 05/01/2014 1322   ALKPHOS 90 07/10/2023 0920   ALKPHOS 47 05/01/2014 1322   AST 15 07/10/2023 0920   ALT 21 07/10/2023 0920   ALT 13 (L) 05/01/2014 1322   BILITOT 0.8 07/10/2023 0920       RADIOGRAPHIC STUDIES: I have personally reviewed the radiological images as listed and agreed with the findings in the report. No results found.   ASSESSMENT & PLAN:  Multiple myeloma in relapse (HCC) #Multiple myeloma-most recently on maintenance Revlimid  15 mg 2  weeks on 2 week off.  JAN 2025-  M-protein- 1.3[April 2023- NEG]; kappa=113 lambda light chain ratio=2.5 [July 2020- 2.0]. RISING- ca- 10.4. # APRIL 2025- . Diffuse lytic myelomatous lesions throughout the skeleton. Most of these lesions demonstrate hypermetabolism; Healing left mid clavicle fracture, likely pathologic; Early nondisplaced pathological fracture involving the right mid clavicle; Left pubic bone lesion has a defect in the outer cortex and this could be a pending pathologic fracture; . No findings for extraskeletal myeloma. Bone marrow Bx- on APRIL 25th, 2025- Pending results.  April 2025 discontinue lenalidomide . On Dara Velcade   dexamethasone .    # proceed with Dara Velcade  dexamethasone .  cycle #2 day 15 today. Labs-CBC/chemistries were reviewed with the patient.  I again discussed with the patient that I would strongly recommend CAR-T therapy, as unfortunately current therapy would only result in temporary improvement of his labs/response.  Continue current therapy for now.  # GI: nausea and vomiting; and diarrhea 2-3 days/hiccups/weight loss- improved- continue 20 mg of dexamethasone .  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 Aaron Aas April 2024- Vit D 38- recommend Vit D 50,K/weekly- CONTINUE TO HOLD demadex ; HOLD QUINAPRIL -  stable-weight gain/leg swelling-recommend leg elevation compression stockings.  Consider restarting Demadex  at next visit.  # Acute back pain: Progressive bone lesions on PET scan April 2025-status post Zometa  improved with tramadol .  stable  # DM- on OHA -recommend close monitoring of blood glucose levels. Recommend bringing log at this time.  stable  # Hypothyroidism: on synthroid - stable  # infection prophylaxis: Continue  acyclovir  twice a day.   stable  # ACP [06/05/2023]:  full code.   For 1st 2 cycles- Velcade  twice weekly- Dara-Velcade  q Weekly x 4 cycles- starting # 5-8 dara q 3W; cycles 9+- dara q 28 days  valcade;  Zometa  q 6 M-last 06/12/2023-  PS;  Disposition: AM preference # chemo today;  chemo-  #  follow up in 1 week- Alan: labs- cbc/cmp; chemo;- # follow up in 2 week- Alan- labs- cbc/cmp; chemo; #  follow up in 3 week- Alan: labs- cbc/cmp; chemo; MM panel; K/l light chains #  follow up in 4 week- Alan: labs- cbc/cmp; -chemo; Dr.B        Orders Placed This Encounter  Procedures   CBC with Differential (Cancer Center Only)    Standing Status:   Future    Expected Date:   08/07/2023    Expiration Date:   08/06/2024   CMP (Cancer Center only)    Standing Status:   Future    Expected Date:   08/07/2023     Expiration Date:   08/06/2024    All questions were answered. The patient knows to call the clinic with any problems, questions or concerns.      [name], Alan 07/10/2023 10:29 AM

## 2023-07-10 NOTE — Progress Notes (Signed)
 Saw [facility] yesterday about treatment. Pt states if he takes treatment at [facility] he will need a port.   BP elevated, pt stated he did not take his meds this morning, pt told bp elevated, keep check at home, contact pcp if con't to stay elevated

## 2023-07-10 NOTE — Patient Instructions (Signed)
 [name] - A DEPT OF [facility]  Discharge Instructions: Thank you for choosing [facility] to provide your oncology and hematology care.  If you have a lab appointment with the Cancer Center, please go directly to the Cancer Center and check in at the registration area.  Wear comfortable clothing and clothing appropriate for easy access to any Portacath or PICC line.   We strive to give you quality time with your provider. You may need to reschedule your appointment if you arrive late (15 or more minutes).  Arriving late affects you and other patients whose appointments are after yours.  Also, if you miss three or more appointments without notifying the office, you may be dismissed from the clinic at the provider's discretion.      For prescription refill requests, have your pharmacy contact our office and allow 72 hours for refills to be completed.    Today you received the following chemotherapy and/or immunotherapy agents darzalex    To help prevent nausea and vomiting after your treatment, we encourage you to take your nausea medication as directed.  BELOW ARE SYMPTOMS THAT SHOULD BE REPORTED IMMEDIATELY: *FEVER GREATER THAN 100.4 F (38 C) OR HIGHER *CHILLS OR SWEATING *NAUSEA AND VOMITING THAT IS NOT CONTROLLED WITH YOUR NAUSEA MEDICATION *UNUSUAL SHORTNESS OF BREATH *UNUSUAL BRUISING OR BLEEDING *URINARY PROBLEMS (pain or burning when urinating, or frequent urination) *BOWEL PROBLEMS (unusual diarrhea, constipation, pain near the anus) TENDERNESS IN MOUTH AND THROAT WITH OR WITHOUT PRESENCE OF ULCERS (sore throat, sores in mouth, or a toothache) UNUSUAL RASH, SWELLING OR PAIN  UNUSUAL VAGINAL DISCHARGE OR ITCHING   Items with * indicate a potential emergency and should be followed up as soon as possible or go to the Emergency Department if any problems should occur.  Please show the CHEMOTHERAPY ALERT CARD or IMMUNOTHERAPY ALERT  CARD at check-in to the Emergency Department and triage nurse.  Should you have questions after your visit or need to cancel or reschedule your appointment, please contact [name] - A DEPT OF [facility]  [phone] and follow the prompts.  Office hours are 8:00 a.m. to 4:30 p.m. Monday - Friday. Please note that voicemails left after 4:00 p.m. may not be returned until the following business day.  We are closed weekends and major holidays. You have access to a nurse at all times for urgent questions. Please call the main number to the clinic [phone] and follow the prompts.  For any non-urgent questions, you may also contact your provider using MyChart. We now offer e-Visits for anyone 100 and older to request care online for non-urgent symptoms. For details visit [email or web address].   Also download the MyChart app! Go to the app store, search "MyChart", open the app, select , and log in with your MyChart username and password.

## 2023-07-10 NOTE — Assessment & Plan Note (Addendum)
#  Multiple myeloma-most recently on maintenance Revlimid  15 mg 2  weeks on 2 week off.  JAN 2025-  M-protein- 1.3[April 2023- NEG]; kappa=113 lambda light chain ratio=2.5 [July 2020- 2.0]. RISING- ca- 10.4. # APRIL 2025- . Diffuse lytic myelomatous lesions throughout the skeleton. Most of these lesions demonstrate hypermetabolism; Healing left mid clavicle fracture, likely pathologic; Early nondisplaced pathological fracture involving the right mid clavicle; Left pubic bone lesion has a defect in the outer cortex and this could be a pending pathologic fracture; . No findings for extraskeletal myeloma. Bone marrow Bx- on APRIL 25th, 2025- Pending results.  April 2025 discontinue lenalidomide . On Dara Velcade  dexamethasone .    # proceed with Dara Velcade  dexamethasone .  cycle #2 day 15 today. Labs-CBC/chemistries were reviewed with the patient.  I again discussed with the patient that I would strongly recommend CAR-T therapy, as unfortunately current therapy would only result in temporary improvement of his labs/response.  Continue current therapy for now.  # GI: nausea and vomiting; and diarrhea 2-3 days/hiccups/weight loss- improved- continue 20 mg of dexamethasone .  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 Aaron Aas April 2024- Vit D 38- recommend Vit D 50,K/weekly- CONTINUE TO HOLD demadex ; HOLD QUINAPRIL -  stable-weight gain/leg swelling-recommend leg elevation compression stockings.  Consider restarting Demadex  at next visit.  # Acute back pain: Progressive bone lesions on PET scan April 2025-status post Zometa  improved with tramadol .  stable  # DM- on OHA -recommend close monitoring of blood glucose levels. Recommend bringing log at this time.  stable  # Hypothyroidism: on synthroid - stable  # infection prophylaxis: Continue  acyclovir  twice a day.   stable  # ACP [06/05/2023]:  full code.   For 1st 2 cycles- Velcade  twice  weekly- Dara-Velcade  q Weekly x 4 cycles- starting # 5-8 dara q 3W; cycles 9+- dara q 28 days  valcade;  Zometa  q 6 M-last 06/12/2023-  PS;  Disposition: AM preference # chemo today;  chemo-  # follow up in 1 week- MD: labs- cbc/cmp; chemo;- # follow up in 2 week- MD- labs- cbc/cmp; chemo; #  follow up in 3 week- MD: labs- cbc/cmp; chemo; MM panel; K/l light chains #  follow up in 4 week- MD: labs- cbc/cmp; -chemo; Dr.B

## 2023-07-11 ENCOUNTER — Other Ambulatory Visit: Payer: Self-pay

## 2023-07-17 ENCOUNTER — Inpatient Hospital Stay

## 2023-07-17 ENCOUNTER — Encounter: Payer: Self-pay | Admitting: Internal Medicine

## 2023-07-17 ENCOUNTER — Other Ambulatory Visit: Payer: Self-pay | Admitting: Internal Medicine

## 2023-07-17 ENCOUNTER — Inpatient Hospital Stay (HOSPITAL_BASED_OUTPATIENT_CLINIC_OR_DEPARTMENT_OTHER): Admitting: Internal Medicine

## 2023-07-17 VITALS — BP 149/95 | HR 95 | Temp 97.5°F | Resp 16 | Ht 70.0 in | Wt 200.6 lb

## 2023-07-17 VITALS — BP 150/79 | HR 90

## 2023-07-17 DIAGNOSIS — Z5111 Encounter for antineoplastic chemotherapy: Secondary | ICD-10-CM | POA: Diagnosis not present

## 2023-07-17 DIAGNOSIS — C9002 Multiple myeloma in relapse: Secondary | ICD-10-CM

## 2023-07-17 LAB — CMP (CANCER CENTER ONLY)
ALT: 19 U/L (ref 0–44)
AST: 16 U/L (ref 15–41)
Albumin: 3.6 g/dL (ref 3.5–5.0)
Alkaline Phosphatase: 89 U/L (ref 38–126)
Anion gap: 12 (ref 5–15)
BUN: 12 mg/dL (ref 8–23)
CO2: 25 mmol/L (ref 22–32)
Calcium: 8.1 mg/dL — ABNORMAL LOW (ref 8.9–10.3)
Chloride: 100 mmol/L (ref 98–111)
Creatinine: 1.15 mg/dL (ref 0.61–1.24)
GFR, Estimated: >60 mL/min (ref >60)
Glucose, Bld: 139 mg/dL — ABNORMAL HIGH (ref 70–99)
Potassium: 3.4 mmol/L — ABNORMAL LOW (ref 3.5–5.1)
Sodium: 137 mmol/L (ref 135–145)
Total Bilirubin: 1.2 mg/dL (ref 0.0–1.2)
Total Protein: 6.3 g/dL — ABNORMAL LOW (ref 6.5–8.1)

## 2023-07-17 LAB — CBC WITH DIFFERENTIAL (CANCER CENTER ONLY)
Abs Immature Granulocytes: 0.03 10*3/uL (ref 0.00–0.07)
Basophils Absolute: 0 10*3/uL (ref 0.0–0.1)
Basophils Relative: 0 %
Eosinophils Absolute: 0.1 10*3/uL (ref 0.0–0.5)
Eosinophils Relative: 1 %
HCT: 38.7 % — ABNORMAL LOW (ref 39.0–52.0)
Hemoglobin: 12.8 g/dL — ABNORMAL LOW (ref 13.0–17.0)
Immature Granulocytes: 0 %
Lymphocytes Relative: 4 %
Lymphs Abs: 0.3 10*3/uL — ABNORMAL LOW (ref 0.7–4.0)
MCH: 31.3 pg (ref 26.0–34.0)
MCHC: 33.1 g/dL (ref 30.0–36.0)
MCV: 94.6 fL (ref 80.0–100.0)
Monocytes Absolute: 0.8 10*3/uL (ref 0.1–1.0)
Monocytes Relative: 11 %
Neutro Abs: 6.1 10*3/uL (ref 1.7–7.7)
Neutrophils Relative %: 84 %
Platelet Count: 274 10*3/uL (ref 150–400)
RBC: 4.09 MIL/uL — ABNORMAL LOW (ref 4.22–5.81)
RDW: 17.1 % — ABNORMAL HIGH (ref 11.5–15.5)
WBC Count: 7.4 10*3/uL (ref 4.0–10.5)
nRBC: 0 % (ref 0.0–0.2)

## 2023-07-17 MED ORDER — BORTEZOMIB CHEMO SQ INJECTION 3.5 MG (2.5MG/ML)
1.3000 mg/m2 | Freq: Once | INTRAMUSCULAR | Status: AC
  Administered 2023-07-17: 2.75 mg via SUBCUTANEOUS
  Filled 2023-07-17: qty 1.1

## 2023-07-17 MED ORDER — DARATUMUMAB-HYALURONIDASE-FIHJ 1800-30000 MG-UT/15ML ~~LOC~~ SOLN
1800.0000 mg | Freq: Once | SUBCUTANEOUS | Status: AC
  Administered 2023-07-17: 1800 mg via SUBCUTANEOUS
  Filled 2023-07-17: qty 15

## 2023-07-17 MED ORDER — DEXAMETHASONE 4 MG PO TABS
20.0000 mg | ORAL_TABLET | Freq: Once | ORAL | Status: AC
  Administered 2023-07-17: 20 mg via ORAL
  Filled 2023-07-17: qty 5

## 2023-07-17 MED ORDER — DIPHENHYDRAMINE HCL 25 MG PO CAPS
50.0000 mg | ORAL_CAPSULE | Freq: Once | ORAL | Status: AC
  Administered 2023-07-17: 50 mg via ORAL
  Filled 2023-07-17: qty 2

## 2023-07-17 MED ORDER — ACETAMINOPHEN 325 MG PO TABS
650.0000 mg | ORAL_TABLET | Freq: Once | ORAL | Status: AC
  Administered 2023-07-17: 650 mg via ORAL
  Filled 2023-07-17: qty 2

## 2023-07-17 NOTE — Assessment & Plan Note (Addendum)
#  Multiple myeloma-PROGRESSED ON  maintenance Revlimid .  JAN 2025-  M-protein- 1.3[April 2023- NEG]; kappa=113 lambda light chain ratio=2.5 [July 2020- 2.0]. RISING- ca- 10.4. # APRIL 2025- . Diffuse lytic myelomatous lesions throughout the skeleton. Most of these lesions demonstrate hypermetabolism; Healing left mid clavicle fracture, likely pathologic; Early nondisplaced pathological fracture involving the right mid clavicle; Left pubic bone lesion has a defect in the outer cortex and this could be a pending pathologic fracture; . No findings for extraskeletal myeloma. Bone marrow Bx- on APRIL 25th, 2025- Pending results.  April 2025 discontinue lenalidomide . On Dara Velcade  dexamethasone .    # proceed with Dara Velcade  dexamethasone .  cycle #3 day 1 today. Labs-CBC/chemistries were reviewed with the patient.  Patient interested in CAR-T therapy in mid-sep 2025. Pt to inform [facility] of his plans. Continue current therapy for now.  # Diarrhea intermittent/hiccups/weight loss- improved- continue 20 mg of dexamethasone - stable.  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 Aaron Aas April 2024- Vit D 38- recommend Vit D 50,K/weekly- CONTINUE TO HOLD demadex ; HOLD QUINAPRIL -  stable-weight gain/leg swelling-recommend leg elevation compression stockings.  Consider restarting Demadex  at next visit.  # Acute back pain: Progressive bone lesions on PET scan April 2025-status post Zometa  improved with tramadol  at bedtime-improved/  stable  # DM- on OHA -recommend close monitoring of blood glucose levels. Recommend bringing log at this time.  stable  # Hypothyroidism: on synthroid - stable  # infection prophylaxis: Continue  acyclovir  twice a day.   stable  # ACP [06/05/2023]:  full code.   For 1st 2 cycles- Velcade  twice weekly- Dara-Velcade  q Weekly x 4 cycles- starting # 5-8 dara q 3W; cycles 9+- dara q 28 days  valcade;  Zometa  q 6 M-last  06/12/2023-  PS;  Disposition: AM preference # chemo today;  chemo-  # follow up in 1 week- MD: labs- cbc/cmp; chemo;- # follow up in 2 week- MD- labs- cbc/cmp; chemo;MM panel; K/l light chains #  follow up in 3 week- MD: labs- cbc/cmp; chemo;  #  follow up in 4 week- MD: labs- cbc/cmp; -chemo; Dr.B

## 2023-07-17 NOTE — Progress Notes (Signed)
 Appetite 50% normal, has the occasional glucerna. He states the drink makes his glucose go up. What is something else he can have?

## 2023-07-17 NOTE — Patient Instructions (Signed)
 [name] - A DEPT OF [facility]  Discharge Instructions: Thank you for choosing [facility] to provide your oncology and hematology care.  If you have a lab appointment with the Cancer Center, please go directly to the Cancer Center and check in at the registration area.  Wear comfortable clothing and clothing appropriate for easy access to any Portacath or PICC line.   We strive to give you quality time with your provider. You may need to reschedule your appointment if you arrive late (15 or more minutes).  Arriving late affects you and other patients whose appointments are after yours.  Also, if you miss three or more appointments without notifying the office, you may be dismissed from the clinic at the provider's discretion.      For prescription refill requests, have your pharmacy contact our office and allow 72 hours for refills to be completed.    Today you received the following chemotherapy and/or immunotherapy agents Darzalex  and Velcade .      To help prevent nausea and vomiting after your treatment, we encourage you to take your nausea medication as directed.  BELOW ARE SYMPTOMS THAT SHOULD BE REPORTED IMMEDIATELY: *FEVER GREATER THAN 100.4 F (38 C) OR HIGHER *CHILLS OR SWEATING *NAUSEA AND VOMITING THAT IS NOT CONTROLLED WITH YOUR NAUSEA MEDICATION *UNUSUAL SHORTNESS OF BREATH *UNUSUAL BRUISING OR BLEEDING *URINARY PROBLEMS (pain or burning when urinating, or frequent urination) *BOWEL PROBLEMS (unusual diarrhea, constipation, pain near the anus) TENDERNESS IN MOUTH AND THROAT WITH OR WITHOUT PRESENCE OF ULCERS (sore throat, sores in mouth, or a toothache) UNUSUAL RASH, SWELLING OR PAIN  UNUSUAL VAGINAL DISCHARGE OR ITCHING   Items with * indicate a potential emergency and should be followed up as soon as possible or go to the Emergency Department if any problems should occur.  Please show the CHEMOTHERAPY ALERT CARD or  IMMUNOTHERAPY ALERT CARD at check-in to the Emergency Department and triage nurse.  Should you have questions after your visit or need to cancel or reschedule your appointment, please contact [name] - A DEPT OF [facility]  [phone] and follow the prompts.  Office hours are 8:00 a.m. to 4:30 p.m. Monday - Friday. Please note that voicemails left after 4:00 p.m. may not be returned until the following business day.  We are closed weekends and major holidays. You have access to a nurse at all times for urgent questions. Please call the main number to the clinic [phone] and follow the prompts.  For any non-urgent questions, you may also contact your provider using MyChart. We now offer e-Visits for anyone 60 and older to request care online for non-urgent symptoms. For details visit [email or web address].   Also download the MyChart app! Go to the app store, search "MyChart", open the app, select [facility], and log in with your MyChart username and password.

## 2023-07-17 NOTE — Progress Notes (Signed)
 [facility] OFFICE PROGRESS NOTE  Patient Care Team: [name], MD as PCP - General (Internal Medicine) Eduardo Grade, MD as Consulting Physician (Dermatology) [name], MD as Consulting Physician (Hematology and Oncology)   Cancer Staging  Multiple myeloma in relapse [facility]) Staging form: Plasma Cell Myeloma and Plasma Cell Disorders, AJCC 8th Edition - Clinical: No stage assigned - Unsigned - Clinical: RISS Stage I (Beta-2 -microglobulin (mg/L): 2.4, Albumin (g/dL): 3.5, ISS: Stage I, High-risk cytogenetics: Absent, LDH: Normal) - Signed by [name], MD on 06/19/2023 Beta 2 microglobulin range (mg/L): Less than 3.5 Albumin range (g/dL): Greater than or equal to 3.5 Cytogenetics: No abnormalities    Oncology History Overview Note   # April 2014- MULTIPLE MYELOMA [IgG- 2.2gm/dl; 40-98% plasma cell; Cyto-N; FISH- gain of chr.9, 15 & ccnd1/11q13] s/p RVD x6 [sep 2014; BMBx- ~5% plasma cells]; Maint Rev-DEX-Zometa ; March 2015-Stopped Dex Cont- Rev-Zometa ; July 2016- Rev 25mg ; NOV 4th  2016-HOLD; BMT eval at [facility] [Dr.Woods]; declined BMT.   # Re-START FEB 2017 Rev 15mg  3 W- on & 1 w OFF; HELD Rev June- Oct 2020- sec to worsening renal function [Dr.Singh]; SEP 2021- Rev 15 mg 2 w-On & 2 w-OFF. STOP REVLIMD in APRIL 2025-progression.    # APRIL 2025- . Diffuse lytic myelomatous lesions throughout the skeleton. Most of these lesions demonstrate hypermetabolism; Healing left mid clavicle fracture, likely pathologic; Early nondisplaced pathological fracture involving the right mid clavicle; Left pubic bone lesion has a defect in the outer cortex and this could be a pending pathologic fracture; . No findings for extraskeletal myeloma. Bone marrow Bx- on APRIL 25th, 2025- Pending.  # April 2025 discontinue lenalidomide .  # APRIL 29th, 2025- start Dara-Velcade  -Dex   # Zometa   # CKD [creat ~1.4; sec MM; Dr.Singh];  DM  -------------------------------------------------------------------------   DIAGNOSIS: [name] ] MULTIPLE MYELOMA  GOALS: control     Multiple myeloma in remission (HCC)  10/01/2015 Initial Diagnosis   Multiple myeloma in remission (HCC)   Multiple myeloma in relapse (HCC)  05/14/2023 Initial Diagnosis   Multiple myeloma in relapse (HCC)   06/05/2023 -  Chemotherapy   Patient is on Treatment Plan : MYELOMA RELAPSED / REFRACTORY Daratumumab  SQ + Bortezomib  + Dexamethasone  (DaraVd) q21d / Daratumumab  SQ q28d      06/05/2023 Cancer Staging   Staging form: Plasma Cell Myeloma and Plasma Cell Disorders, AJCC 8th Edition - Clinical: RISS Stage I (Beta-2 -microglobulin (mg/L): 2.4, Albumin (g/dL): 3.5, ISS: Stage I, High-risk cytogenetics: Absent, LDH: Normal) - Signed by [name], MD on 06/19/2023 Beta 2 microglobulin range (mg/L): Less than 3.5 Albumin range (g/dL): Greater than or equal to 3.5 Cytogenetics: No abnormalities    INTERVAL HISTORY: pt is  with wife.  Ambulating independently.   Alan Alexander 78 y.o.  male pleasant patient above history of recurrent multiple myeloma  is to  proceed with chemotherapy.   Patients Appetite 50% normal, has the occasional glucerna. Improved hiccups. Patient also started on tramadol  1 pills at bedtime prn.  Noted to have improvement of his hip pain. Patient denies any skin rash. No swelling in the legs.  Patient also denies any tingling or numbness.  Review of Systems  Constitutional:  Negative for chills, diaphoresis, fever, malaise/fatigue and weight loss.  HENT:  Negative for nosebleeds and sore throat.   Eyes:  Negative for double vision.  Respiratory:  Negative for cough, hemoptysis, sputum production, shortness of breath and wheezing.   Cardiovascular:  Positive for leg  swelling. Negative for chest pain, palpitations and orthopnea.  Gastrointestinal:  Negative for abdominal pain, blood in stool, constipation, diarrhea,  heartburn, melena, nausea and vomiting.  Genitourinary:  Negative for dysuria, frequency and urgency.  Musculoskeletal:  Positive for back pain and joint pain.  Skin: Negative.  Negative for itching and rash.  Neurological:  Negative for dizziness, tingling, focal weakness, weakness and headaches.  Endo/Heme/Allergies:  Does not bruise/bleed easily.  Psychiatric/Behavioral:  Negative for depression. The patient is not nervous/anxious and does not have insomnia.       PAST MEDICAL HISTORY :  Past Medical History:  Diagnosis Date   Diabetes mellitus without complication (HCC)    GERD (gastroesophageal reflux disease)    Hyperlipidemia    Hypertension    Multiple myeloma (HCC)    Obesity    Thyroid cancer (HCC)    Thyroid disease     PAST SURGICAL HISTORY :   Past Surgical History:  Procedure Laterality Date   BONE MARROW BIOPSY  2014   CATARACT EXTRACTION BILATERAL W/ ANTERIOR VITRECTOMY     IR BONE MARROW BIOPSY & ASPIRATION  06/01/2023    FAMILY HISTORY :   Family History  Problem Relation Age of Onset   Cancer Father 77   Diabetes Mother     SOCIAL HISTORY:   Social History   Tobacco Use   Smoking status: Never   Smokeless tobacco: Never  Substance Use Topics   Alcohol use: No   Drug use: No    ALLERGIES:  has no known allergies.  MEDICATIONS:  Current Outpatient Medications  Medication Sig Dispense Refill   acyclovir  (ZOVIRAX ) 400 MG tablet Take 1 tablet (400 mg total) by mouth 2 (two) times daily. 120 tablet 2   amLODipine  (NORVASC ) 10 MG tablet Take 10 mg by mouth daily.     aspirin EC 81 MG tablet Take 81 mg by mouth daily.     atorvastatin  (LIPITOR) 20 MG tablet Take 20 mg by mouth daily.     chlorproMAZINE  (THORAZINE ) 25 MG tablet Take 1 tablet (25 mg total) by mouth 3 (three) times daily. 60 tablet 1   Cyanocobalamin 1000 MCG TBCR Take by mouth.     cyclobenzaprine  (FLEXERIL ) 5 MG tablet Take 1 tablet (5 mg total) by mouth 3 (three) times daily as  needed. 15 tablet 0   dapagliflozin propanediol (FARXIGA) 10 MG TABS tablet TAKE 10 MG BY MOUTH 1 (ONE) TIME EACH DAY IN THE MORNING     ergocalciferol  (VITAMIN D2) 1.25 MG (50000 UT) capsule Take 1 capsule (50,000 Units total) by mouth once a week. 12 capsule 1   glipiZIDE (GLUCOTROL) 5 MG tablet Take 5 mg by mouth every morning.     levothyroxine  (SYNTHROID ) 137 MCG tablet Take 1 tablet by mouth daily.      ondansetron  (ZOFRAN ) 8 MG tablet One pill every 8 hours as needed for nausea/vomitting. 40 tablet 1   ONETOUCH DELICA LANCETS 33G MISC Inject 1 Lancet as directed once daily.     potassium chloride  SA (K-DUR,KLOR-CON ) 20 MEQ tablet Take 20 mEq by mouth 2 (two) times daily.   0   prochlorperazine  (COMPAZINE ) 10 MG tablet Take 1 tablet (10 mg total) by mouth every 6 (six) hours as needed for nausea or vomiting. 40 tablet 1   sodium bicarbonate 650 MG tablet Take 650 mg by mouth 2 (two) times daily.     traMADol  (ULTRAM ) 50 MG tablet Take 1 tablet (50 mg total) by mouth every 6 (  six) hours as needed. 45 tablet 0   No current facility-administered medications for this visit.   Facility-Administered Medications Ordered in Other Visits  Medication Dose Route Frequency Provider Last Rate Last Admin   acetaminophen  (TYLENOL ) tablet 650 mg  650 mg Oral Once Alyze Lauf R, MD       bortezomib  SQ (VELCADE ) chemo injection (2.5mg /mL concentration) 2.75 mg  1.3 mg/m2 (Treatment Plan Recorded) Subcutaneous Once Yarieliz Wasser R, MD       daratumumab -hyaluronidase -fihj (DARZALEX  FASPRO) 1800-30000 MG-UT/15ML chemo SQ injection 1,800 mg  1,800 mg Subcutaneous Once Gadiel John R, MD       dexamethasone  (DECADRON ) tablet 20 mg  20 mg Oral Once Mostyn Varnell R, MD       diphenhydrAMINE  (BENADRYL ) capsule 50 mg  50 mg Oral Once Jamariyah Johannsen R, MD        PHYSICAL EXAMINATION: ECOG PERFORMANCE STATUS: 0 - Asymptomatic  BP (!) 149/95 (BP Location: Left Arm, Patient Position:  Sitting, Cuff Size: Large)   Pulse 95   Temp (!) 97.5 F (36.4 C) (Tympanic)   Resp 16   Ht 5\' 10"  (1.778 m)   Wt 200 lb 9.6 oz (91 kg)   SpO2 100%   BMI 28.78 kg/m   Filed Weights   07/17/23 0843  Weight: 200 lb 9.6 oz (91 kg)      Physical Exam Vitals and nursing note reviewed.  HENT:     Head: Normocephalic and atraumatic.     Mouth/Throat:     Pharynx: Oropharynx is clear. No oropharyngeal exudate.  Eyes:     Extraocular Movements: Extraocular movements intact.     Pupils: Pupils are equal, round, and reactive to light.  Cardiovascular:     Rate and Rhythm: Normal rate and regular rhythm.  Pulmonary:     Effort: No respiratory distress.     Breath sounds: No wheezing.     Comments: Decreased breath sounds bilaterally.  Abdominal:     General: Bowel sounds are normal. There is no distension.     Palpations: Abdomen is soft. There is no mass.     Tenderness: There is no abdominal tenderness. There is no guarding or rebound.  Musculoskeletal:        General: No tenderness. Normal range of motion.     Cervical back: Normal range of motion and neck supple.  Skin:    General: Skin is warm.  Neurological:     General: No focal deficit present.     Mental Status: He is alert and oriented to person, place, and time.  Psychiatric:        Mood and Affect: Affect normal.        Behavior: Behavior normal.        Judgment: Judgment normal.       LABORATORY DATA:  I have reviewed the data as listed    Component Value Date/Time   NA 137 07/17/2023 0841   NA 139 05/01/2014 1322   K 3.4 (L) 07/17/2023 0841   K 4.4 05/01/2014 1322   CL 100 07/17/2023 0841   CL 107 05/01/2014 1322   CO2 25 07/17/2023 0841   CO2 27 05/01/2014 1322   GLUCOSE 139 (H) 07/17/2023 0841   GLUCOSE 102 (H) 05/01/2014 1322   BUN 12 07/17/2023 0841   BUN 16 05/01/2014 1322   CREATININE 1.15 07/17/2023 0841   CREATININE 1.48 (H) 05/29/2014 0949   CALCIUM 8.1 (L) 07/17/2023 0841   CALCIUM  9.1 05/01/2014 1322  PROT 6.3 (L) 07/17/2023 0841   PROT 7.5 05/01/2014 1322   ALBUMIN 3.6 07/17/2023 0841   ALBUMIN 3.6 05/01/2014 1322   AST 16 07/17/2023 0841   ALT 19 07/17/2023 0841   ALT 13 (L) 05/01/2014 1322   ALKPHOS 89 07/17/2023 0841   ALKPHOS 47 05/01/2014 1322   BILITOT 1.2 07/17/2023 0841   GFRNONAA >60 07/17/2023 0841   GFRNONAA 47 (L) 05/29/2014 0949   GFRAA 47 (L) 10/21/2019 1106   GFRAA 55 (L) 05/29/2014 0949    No results found for: "SPEP", "UPEP"  Lab Results  Component Value Date   WBC 7.4 07/17/2023   NEUTROABS 6.1 07/17/2023   HGB 12.8 (L) 07/17/2023   HCT 38.7 (L) 07/17/2023   MCV 94.6 07/17/2023   PLT 274 07/17/2023      Chemistry      Component Value Date/Time   NA 137 07/17/2023 0841   NA 139 05/01/2014 1322   K 3.4 (L) 07/17/2023 0841   K 4.4 05/01/2014 1322   CL 100 07/17/2023 0841   CL 107 05/01/2014 1322   CO2 25 07/17/2023 0841   CO2 27 05/01/2014 1322   BUN 12 07/17/2023 0841   BUN 16 05/01/2014 1322   CREATININE 1.15 07/17/2023 0841   CREATININE 1.48 (H) 05/29/2014 0949      Component Value Date/Time   CALCIUM 8.1 (L) 07/17/2023 0841   CALCIUM 9.1 05/01/2014 1322   ALKPHOS 89 07/17/2023 0841   ALKPHOS 47 05/01/2014 1322   AST 16 07/17/2023 0841   ALT 19 07/17/2023 0841   ALT 13 (L) 05/01/2014 1322   BILITOT 1.2 07/17/2023 0841       RADIOGRAPHIC STUDIES: I have personally reviewed the radiological images as listed and agreed with the findings in the report. No results found.   ASSESSMENT & PLAN:  Multiple myeloma in relapse (HCC) #Multiple myeloma-PROGRESSED ON  maintenance Revlimid .  JAN 2025-  M-protein- 1.3[April 2023- NEG]; kappa=113 lambda light chain ratio=2.5 [July 2020- 2.0]. RISING- ca- 10.4. # APRIL 2025- . Diffuse lytic myelomatous lesions throughout the skeleton. Most of these lesions demonstrate hypermetabolism; Healing left mid clavicle fracture, likely pathologic; Early nondisplaced pathological fracture  involving the right mid clavicle; Left pubic bone lesion has a defect in the outer cortex and this could be a pending pathologic fracture; . No findings for extraskeletal myeloma. Bone marrow Bx- on APRIL 25th, 2025- Pending results.  April 2025 discontinue lenalidomide . On Dara Velcade  dexamethasone .    # proceed with Dara Velcade  dexamethasone .  cycle #3 day 1 today. Labs-CBC/chemistries were reviewed with the patient.  Patient interested in CAR-T therapy in mid-sep 2025. Pt to inform [facility] of his plans. Continue current therapy for now.  # Diarrhea intermittent/hiccups/weight loss- improved- continue 20 mg of dexamethasone - stable.  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 Aaron Aas April 2024- Vit D 38- recommend Vit D 50,K/weekly- CONTINUE TO HOLD demadex ; HOLD QUINAPRIL -  stable-weight gain/leg swelling-recommend leg elevation compression stockings.  Consider restarting Demadex  at next visit.  # Acute back pain: Progressive bone lesions on PET scan April 2025-status post Zometa  improved with tramadol  at bedtime-improved/  stable  # DM- on OHA -recommend close monitoring of blood glucose levels. Recommend bringing log at this time.  stable  # Hypothyroidism: on synthroid - stable  # infection prophylaxis: Continue  acyclovir  twice a day.   stable  # ACP [06/05/2023]:  full code.   For 1st  2 cycles- Velcade  twice weekly- Dara-Velcade  q Weekly x 4 cycles- starting # 5-8 dara q 3W; cycles 9+- dara q 28 days  valcade;  Zometa  q 6 M-last 06/12/2023-  PS;  Disposition: AM preference # chemo today;  chemo-  # follow up in 1 week- MD: labs- cbc/cmp; chemo;- # follow up in 2 week- MD- labs- cbc/cmp; chemo;MM panel; K/l light chains #  follow up in 3 week- MD: labs- cbc/cmp; chemo;  #  follow up in 4 week- MD: labs- cbc/cmp; -chemo; Dr.B     Orders Placed This Encounter  Procedures   CBC with Differential (Cancer  Center Only)    Standing Status:   Future    Expected Date:   08/14/2023    Expiration Date:   08/13/2024   CMP (Cancer Center only)    Standing Status:   Future    Expected Date:   08/14/2023    Expiration Date:   08/13/2024   CBC with Differential (Cancer Center Only)    Standing Status:   Future    Expected Date:   07/31/2023    Expiration Date:   07/16/2024   Multiple Myeloma Panel (SPEP&IFE w/QIG)    Standing Status:   Future    Expected Date:   07/31/2023    Expiration Date:   07/16/2024   Kappa/lambda light chains    Standing Status:   Future    Expected Date:   07/31/2023    Expiration Date:   07/16/2024    All questions were answered. The patient knows to call the clinic with any problems, questions or concerns.      [name], MD 07/17/2023 10:16 AM

## 2023-07-18 ENCOUNTER — Other Ambulatory Visit: Payer: Self-pay

## 2023-07-18 LAB — KAPPA/LAMBDA LIGHT CHAINS
Kappa free light chain: 10.2 mg/L (ref 3.3–19.4)
Kappa, lambda light chain ratio: 1.76 — ABNORMAL HIGH (ref 0.26–1.65)
Lambda free light chains: 5.8 mg/L (ref 5.7–26.3)

## 2023-07-20 ENCOUNTER — Ambulatory Visit

## 2023-07-24 ENCOUNTER — Other Ambulatory Visit: Payer: Self-pay | Admitting: Internal Medicine

## 2023-07-24 ENCOUNTER — Encounter: Payer: Self-pay | Admitting: Internal Medicine

## 2023-07-24 ENCOUNTER — Inpatient Hospital Stay

## 2023-07-24 ENCOUNTER — Inpatient Hospital Stay (HOSPITAL_BASED_OUTPATIENT_CLINIC_OR_DEPARTMENT_OTHER): Admitting: Internal Medicine

## 2023-07-24 VITALS — BP 125/79 | HR 86 | Resp 16

## 2023-07-24 VITALS — BP 134/78 | HR 87 | Temp 96.9°F | Resp 16 | Ht 70.0 in | Wt 200.3 lb

## 2023-07-24 DIAGNOSIS — C9002 Multiple myeloma in relapse: Secondary | ICD-10-CM

## 2023-07-24 DIAGNOSIS — Z5111 Encounter for antineoplastic chemotherapy: Secondary | ICD-10-CM | POA: Diagnosis not present

## 2023-07-24 LAB — CBC WITH DIFFERENTIAL (CANCER CENTER ONLY)
Abs Immature Granulocytes: 0.02 10*3/uL (ref 0.00–0.07)
Basophils Absolute: 0 10*3/uL (ref 0.0–0.1)
Basophils Relative: 0 %
Eosinophils Absolute: 0.1 10*3/uL (ref 0.0–0.5)
Eosinophils Relative: 1 %
HCT: 38.3 % — ABNORMAL LOW (ref 39.0–52.0)
Hemoglobin: 12.7 g/dL — ABNORMAL LOW (ref 13.0–17.0)
Immature Granulocytes: 0 %
Lymphocytes Relative: 6 %
Lymphs Abs: 0.4 10*3/uL — ABNORMAL LOW (ref 0.7–4.0)
MCH: 31.4 pg (ref 26.0–34.0)
MCHC: 33.2 g/dL (ref 30.0–36.0)
MCV: 94.8 fL (ref 80.0–100.0)
Monocytes Absolute: 0.7 10*3/uL (ref 0.1–1.0)
Monocytes Relative: 11 %
Neutro Abs: 5.1 10*3/uL (ref 1.7–7.7)
Neutrophils Relative %: 82 %
Platelet Count: 253 10*3/uL (ref 150–400)
RBC: 4.04 MIL/uL — ABNORMAL LOW (ref 4.22–5.81)
RDW: 17 % — ABNORMAL HIGH (ref 11.5–15.5)
WBC Count: 6.4 10*3/uL (ref 4.0–10.5)
nRBC: 0 % (ref 0.0–0.2)

## 2023-07-24 LAB — CMP (CANCER CENTER ONLY)
ALT: 14 U/L (ref 0–44)
AST: 12 U/L — ABNORMAL LOW (ref 15–41)
Albumin: 3.5 g/dL (ref 3.5–5.0)
Alkaline Phosphatase: 79 U/L (ref 38–126)
Anion gap: 8 (ref 5–15)
BUN: 12 mg/dL (ref 8–23)
CO2: 25 mmol/L (ref 22–32)
Calcium: 8.3 mg/dL — ABNORMAL LOW (ref 8.9–10.3)
Chloride: 105 mmol/L (ref 98–111)
Creatinine: 1.16 mg/dL (ref 0.61–1.24)
GFR, Estimated: >60 mL/min (ref >60)
Glucose, Bld: 134 mg/dL — ABNORMAL HIGH (ref 70–99)
Potassium: 3.2 mmol/L — ABNORMAL LOW (ref 3.5–5.1)
Sodium: 138 mmol/L (ref 135–145)
Total Bilirubin: 0.9 mg/dL (ref 0.0–1.2)
Total Protein: 6.2 g/dL — ABNORMAL LOW (ref 6.5–8.1)

## 2023-07-24 MED ORDER — BORTEZOMIB CHEMO SQ INJECTION 3.5 MG (2.5MG/ML)
1.3000 mg/m2 | Freq: Once | INTRAMUSCULAR | Status: AC
  Administered 2023-07-24: 2.75 mg via SUBCUTANEOUS
  Filled 2023-07-24: qty 1.1

## 2023-07-24 MED ORDER — TRAMADOL HCL 50 MG PO TABS: 50.0000 mg | ORAL_TABLET | Freq: Two times a day (BID) | ORAL | 0 refills | Status: DC | PRN

## 2023-07-24 MED ORDER — ACETAMINOPHEN 325 MG PO TABS
650.0000 mg | ORAL_TABLET | Freq: Once | ORAL | Status: AC
  Administered 2023-07-24: 650 mg via ORAL
  Filled 2023-07-24: qty 2

## 2023-07-24 MED ORDER — DIPHENHYDRAMINE HCL 25 MG PO CAPS
50.0000 mg | ORAL_CAPSULE | Freq: Once | ORAL | Status: AC
  Administered 2023-07-24: 50 mg via ORAL
  Filled 2023-07-24: qty 2

## 2023-07-24 MED ORDER — DEXAMETHASONE 4 MG PO TABS
20.0000 mg | ORAL_TABLET | Freq: Once | ORAL | Status: AC
  Administered 2023-07-24: 20 mg via ORAL
  Filled 2023-07-24: qty 5

## 2023-07-24 MED ORDER — DARATUMUMAB-HYALURONIDASE-FIHJ 1800-30000 MG-UT/15ML ~~LOC~~ SOLN
1800.0000 mg | Freq: Once | SUBCUTANEOUS | Status: AC
  Administered 2023-07-24: 1800 mg via SUBCUTANEOUS
  Filled 2023-07-24: qty 15

## 2023-07-24 NOTE — Patient Instructions (Signed)
 [name] - A DEPT OF [facility]  Discharge Instructions: Thank you for choosing [facility] to provide your oncology and hematology care.  If you have a lab appointment with the Cancer Center, please go directly to the Cancer Center and check in at the registration area.  Wear comfortable clothing and clothing appropriate for easy access to any Portacath or PICC line.   We strive to give you quality time with your provider. You may need to reschedule your appointment if you arrive late (15 or more minutes).  Arriving late affects you and other patients whose appointments are after yours.  Also, if you miss three or more appointments without notifying the office, you may be dismissed from the clinic at the provider's discretion.      For prescription refill requests, have your pharmacy contact our office and allow 72 hours for refills to be completed.    Today you received the following chemotherapy and/or immunotherapy agents Darzalex  and Velcade .      To help prevent nausea and vomiting after your treatment, we encourage you to take your nausea medication as directed.  BELOW ARE SYMPTOMS THAT SHOULD BE REPORTED IMMEDIATELY: *FEVER GREATER THAN 100.4 F (38 C) OR HIGHER *CHILLS OR SWEATING *NAUSEA AND VOMITING THAT IS NOT CONTROLLED WITH YOUR NAUSEA MEDICATION *UNUSUAL SHORTNESS OF BREATH *UNUSUAL BRUISING OR BLEEDING *URINARY PROBLEMS (pain or burning when urinating, or frequent urination) *BOWEL PROBLEMS (unusual diarrhea, constipation, pain near the anus) TENDERNESS IN MOUTH AND THROAT WITH OR WITHOUT PRESENCE OF ULCERS (sore throat, sores in mouth, or a toothache) UNUSUAL RASH, SWELLING OR PAIN  UNUSUAL VAGINAL DISCHARGE OR ITCHING   Items with * indicate a potential emergency and should be followed up as soon as possible or go to the Emergency Department if any problems should occur.  Please show the CHEMOTHERAPY ALERT CARD or  IMMUNOTHERAPY ALERT CARD at check-in to the Emergency Department and triage nurse.  Should you have questions after your visit or need to cancel or reschedule your appointment, please contact [name] - A DEPT OF [facility]  [phone] and follow the prompts.  Office hours are 8:00 a.m. to 4:30 p.m. Monday - Friday. Please note that voicemails left after 4:00 p.m. may not be returned until the following business day.  We are closed weekends and major holidays. You have access to a nurse at all times for urgent questions. Please call the main number to the clinic [phone] and follow the prompts.  For any non-urgent questions, you may also contact your provider using MyChart. We now offer e-Visits for anyone 60 and older to request care online for non-urgent symptoms. For details visit [email or web address].   Also download the MyChart app! Go to the app store, search "MyChart", open the app, select [facility], and log in with your MyChart username and password.

## 2023-07-24 NOTE — Progress Notes (Signed)
 Refill tramadol , pended.  Appetite 50% normal, occasional glucerna.

## 2023-07-24 NOTE — Assessment & Plan Note (Addendum)
#  Multiple myeloma-PROGRESSED ON  maintenance Revlimid .  JAN 2025-  M-protein- 1.3[April 2023- NEG]; kappa=113 lambda light chain ratio=2.5 [July 2020- 2.0]. RISING- ca- 10.4. # APRIL 2025- . Diffuse lytic myelomatous lesions throughout the skeleton. Most of these lesions demonstrate hypermetabolism; Healing left mid clavicle fracture, likely pathologic; Early nondisplaced pathological fracture involving the right mid clavicle; Left pubic bone lesion has a defect in the outer cortex and this could be a pending pathologic fracture; . No findings for extraskeletal myeloma. Bone marrow Bx- on APRIL 25th, 2025- Pending results.  April 2025 discontinue lenalidomide . On Dara Velcade  dexamethasone .    # proceed with Dara Velcade  dexamethasone .  cycle #3 day 8 today. Labs-CBC/chemistries were reviewed with the patient.  Patient interested in CAR-T therapy in mid-sep 2025. REMINDED THE Pt to inform [facility] of his plans. Continue current therapy for now.  # Diarrhea intermittent/hiccups/weight loss- improved- continue 20 mg of dexamethasone - stable.  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 Aaron Aas April 2024- Vit D 38- recommend Vit D 50,K/weekly-  HOLD QUINAPRIL -  stable-weight gain/leg swelling-recommend leg elevation compression stockings.   # Acute back pain: Progressive bone lesions on PET scan April 2025-status post Zometa  improved with tramadol  at bedtime-improved/  stable  # DM- on OHA -recommend close monitoring of blood glucose levels. Recommend bringing log at this time. stable  # Hypothyroidism: on synthroid - stable  # infection prophylaxis: Continue  acyclovir  twice a day.  stable  # ACP [06/05/2023]:  full code.   For 1st 2 cycles- Velcade  twice weekly- Dara-Velcade  q Weekly x 4 cycles- starting # 5-8 dara q 3W- MD only on day-1; cycles 9+- dara q 28 days  valcade;  Zometa  q 6 M-last 06/12/2023-  PS;   Disposition: AM preference # q 4 week- MM panel; K/l light chains # chemo today;  chemo-  # follow up in 1 week- MD: labs- cbc/cmp; chemo;-zometa  # follow up in 2 week- MD- labs- cbc/cmp; chemo; #  follow up in 3 week- MD: labs- cbc/cmp; chemo;  #  follow up in 5 week- MD: labs- cbc/cmp; -chemo;  #  follow up in 6  week-  labs- cbc/cmp; -chemo; Dr.B

## 2023-07-24 NOTE — Progress Notes (Signed)
 [facility] OFFICE PROGRESS NOTE  Patient Care Team: [name], MD as PCP - General (Internal Medicine) Eduardo Grade, MD as Consulting Physician (Dermatology) [name], MD as Consulting Physician (Hematology and Oncology)   Cancer Staging  Multiple myeloma in relapse [facility]) Staging form: Plasma Cell Myeloma and Plasma Cell Disorders, AJCC 8th Edition - Clinical: No stage assigned - Unsigned - Clinical: RISS Stage I (Beta-2 -microglobulin (mg/L): 2.4, Albumin (g/dL): 3.5, ISS: Stage I, High-risk cytogenetics: Absent, LDH: Normal) - Signed by [name], MD on 06/19/2023 Beta 2 microglobulin range (mg/L): Less than 3.5 Albumin range (g/dL): Greater than or equal to 3.5 Cytogenetics: No abnormalities    Oncology History Overview Note   # April 2014- MULTIPLE MYELOMA [IgG- 2.2gm/dl; 56-21% plasma cell; Cyto-N; FISH- gain of chr.9, 15 & ccnd1/11q13] s/p RVD x6 [sep 2014; BMBx- ~5% plasma cells]; Maint Rev-DEX-Zometa ; March 2015-Stopped Dex Cont- Rev-Zometa ; July 2016- Rev 25mg ; NOV 4th  2016-HOLD; BMT eval at [facility] [Dr.Woods]; declined BMT.   # Re-START FEB 2017 Rev 15mg  3 W- on & 1 w OFF; HELD Rev June- Oct 2020- sec to worsening renal function [Dr.Singh]; SEP 2021- Rev 15 mg 2 w-On & 2 w-OFF. STOP REVLIMD in APRIL 2025-progression.    # APRIL 2025- . Diffuse lytic myelomatous lesions throughout the skeleton. Most of these lesions demonstrate hypermetabolism; Healing left mid clavicle fracture, likely pathologic; Early nondisplaced pathological fracture involving the right mid clavicle; Left pubic bone lesion has a defect in the outer cortex and this could be a pending pathologic fracture; . No findings for extraskeletal myeloma. Bone marrow Bx- on APRIL 25th, 2025- Pending.  # April 2025 discontinue lenalidomide .  # APRIL 29th, 2025- start Dara-Velcade  -Dex   # Zometa   # CKD [creat ~1.4; sec MM; Dr.Singh];  DM  -------------------------------------------------------------------------   DIAGNOSIS: [name] ] MULTIPLE MYELOMA  GOALS: control     Multiple myeloma in remission (HCC)  10/01/2015 Initial Diagnosis   Multiple myeloma in remission (HCC)   Multiple myeloma in relapse (HCC)  05/14/2023 Initial Diagnosis   Multiple myeloma in relapse (HCC)   06/05/2023 -  Chemotherapy   Patient is on Treatment Plan : MYELOMA RELAPSED / REFRACTORY Daratumumab  SQ + Bortezomib  + Dexamethasone  (DaraVd) q21d / Daratumumab  SQ q28d      06/05/2023 Cancer Staging   Staging form: Plasma Cell Myeloma and Plasma Cell Disorders, AJCC 8th Edition - Clinical: RISS Stage I (Beta-2 -microglobulin (mg/L): 2.4, Albumin (g/dL): 3.5, ISS: Stage I, High-risk cytogenetics: Absent, LDH: Normal) - Signed by [name], MD on 06/19/2023 Beta 2 microglobulin range (mg/L): Less than 3.5 Albumin range (g/dL): Greater than or equal to 3.5 Cytogenetics: No abnormalities    INTERVAL HISTORY: pt is  with wife.  Ambulating independently.   Alan Alexander 78 y.o.  male pleasant patient above history of recurrent multiple myeloma  is to  proceed with chemotherapy.   Patient also taking  tramadol  1 pills  in AM and at bedtime prn.  Noted to have improvement of his hip pain. Patient denies any skin rash. Patient also denies any tingling or numbness.  Mild swelling in the legs.    Review of Systems  Constitutional:  Negative for chills, diaphoresis, fever, malaise/fatigue and weight loss.  HENT:  Negative for nosebleeds and sore throat.   Eyes:  Negative for double vision.  Respiratory:  Negative for cough, hemoptysis, sputum production, shortness of breath and wheezing.   Cardiovascular:  Positive for leg swelling. Negative for chest  pain, palpitations and orthopnea.  Gastrointestinal:  Negative for abdominal pain, blood in stool, constipation, diarrhea, heartburn, melena, nausea and vomiting.  Genitourinary:  Negative for  dysuria, frequency and urgency.  Musculoskeletal:  Positive for back pain and joint pain.  Skin: Negative.  Negative for itching and rash.  Neurological:  Negative for dizziness, tingling, focal weakness, weakness and headaches.  Endo/Heme/Allergies:  Does not bruise/bleed easily.  Psychiatric/Behavioral:  Negative for depression. The patient is not nervous/anxious and does not have insomnia.       PAST MEDICAL HISTORY :  Past Medical History:  Diagnosis Date   Diabetes mellitus without complication (HCC)    GERD (gastroesophageal reflux disease)    Hyperlipidemia    Hypertension    Multiple myeloma (HCC)    Obesity    Thyroid cancer (HCC)    Thyroid disease     PAST SURGICAL HISTORY :   Past Surgical History:  Procedure Laterality Date   BONE MARROW BIOPSY  2014   CATARACT EXTRACTION BILATERAL W/ ANTERIOR VITRECTOMY     IR BONE MARROW BIOPSY & ASPIRATION  06/01/2023    FAMILY HISTORY :   Family History  Problem Relation Age of Onset   Cancer Father 59   Diabetes Mother     SOCIAL HISTORY:   Social History   Tobacco Use   Smoking status: Never   Smokeless tobacco: Never  Substance Use Topics   Alcohol use: No   Drug use: No    ALLERGIES:  has no known allergies.  MEDICATIONS:  Current Outpatient Medications  Medication Sig Dispense Refill   acyclovir  (ZOVIRAX ) 400 MG tablet Take 1 tablet (400 mg total) by mouth 2 (two) times daily. 120 tablet 2   amLODipine  (NORVASC ) 10 MG tablet Take 10 mg by mouth daily.     aspirin EC 81 MG tablet Take 81 mg by mouth daily.     atorvastatin  (LIPITOR) 20 MG tablet Take 20 mg by mouth daily.     chlorproMAZINE  (THORAZINE ) 25 MG tablet Take 1 tablet (25 mg total) by mouth 3 (three) times daily. 60 tablet 1   Cyanocobalamin 1000 MCG TBCR Take by mouth.     cyclobenzaprine  (FLEXERIL ) 5 MG tablet Take 1 tablet (5 mg total) by mouth 3 (three) times daily as needed. 15 tablet 0   dapagliflozin propanediol (FARXIGA) 10 MG TABS  tablet TAKE 10 MG BY MOUTH 1 (ONE) TIME EACH DAY IN THE MORNING     ergocalciferol  (VITAMIN D2) 1.25 MG (50000 UT) capsule Take 1 capsule (50,000 Units total) by mouth once a week. 12 capsule 1   glipiZIDE (GLUCOTROL) 5 MG tablet Take 5 mg by mouth every morning.     levothyroxine  (SYNTHROID ) 137 MCG tablet Take 1 tablet by mouth daily.      ondansetron  (ZOFRAN ) 8 MG tablet One pill every 8 hours as needed for nausea/vomitting. 40 tablet 1   ONETOUCH DELICA LANCETS 33G MISC Inject 1 Lancet as directed once daily.     potassium chloride  SA (K-DUR,KLOR-CON ) 20 MEQ tablet Take 20 mEq by mouth 2 (two) times daily.   0   prochlorperazine  (COMPAZINE ) 10 MG tablet Take 1 tablet (10 mg total) by mouth every 6 (six) hours as needed for nausea or vomiting. 40 tablet 1   sodium bicarbonate 650 MG tablet Take 650 mg by mouth 2 (two) times daily.     traMADol  (ULTRAM ) 50 MG tablet Take 1 tablet (50 mg total) by mouth every 12 (twelve) hours as needed.  60 tablet 0   No current facility-administered medications for this visit.   Facility-Administered Medications Ordered in Other Visits  Medication Dose Route Frequency Provider Last Rate Last Admin   bortezomib  SQ (VELCADE ) chemo injection (2.5mg /mL concentration) 2.75 mg  1.3 mg/m2 (Treatment Plan Recorded) Subcutaneous Once Novah Nessel R, MD       daratumumab -hyaluronidase -fihj (DARZALEX  FASPRO) 1800-30000 MG-UT/15ML chemo SQ injection 1,800 mg  1,800 mg Subcutaneous Once Jaylin Benzel R, MD        PHYSICAL EXAMINATION: ECOG PERFORMANCE STATUS: 0 - Asymptomatic  BP 134/78 (BP Location: Left Arm, Patient Position: Sitting, Cuff Size: Large)   Pulse 87   Temp (!) 96.9 F (36.1 C) (Tympanic)   Resp 16   Ht 5' 10 (1.778 m)   Wt 200 lb 4.8 oz (90.9 kg)   SpO2 100%   BMI 28.74 kg/m   Filed Weights   07/24/23 0830  Weight: 200 lb 4.8 oz (90.9 kg)      Physical Exam Vitals and nursing note reviewed.  HENT:     Head: Normocephalic  and atraumatic.     Mouth/Throat:     Pharynx: Oropharynx is clear. No oropharyngeal exudate.   Eyes:     Extraocular Movements: Extraocular movements intact.     Pupils: Pupils are equal, round, and reactive to light.    Cardiovascular:     Rate and Rhythm: Normal rate and regular rhythm.  Pulmonary:     Effort: No respiratory distress.     Breath sounds: No wheezing.     Comments: Decreased breath sounds bilaterally.  Abdominal:     General: Bowel sounds are normal. There is no distension.     Palpations: Abdomen is soft. There is no mass.     Tenderness: There is no abdominal tenderness. There is no guarding or rebound.   Musculoskeletal:        General: No tenderness. Normal range of motion.     Cervical back: Normal range of motion and neck supple.   Skin:    General: Skin is warm.   Neurological:     General: No focal deficit present.     Mental Status: He is alert and oriented to person, place, and time.   Psychiatric:        Mood and Affect: Affect normal.        Behavior: Behavior normal.        Judgment: Judgment normal.       LABORATORY DATA:  I have reviewed the data as listed    Component Value Date/Time   NA 138 07/24/2023 0829   NA 139 05/01/2014 1322   K 3.2 (L) 07/24/2023 0829   K 4.4 05/01/2014 1322   CL 105 07/24/2023 0829   CL 107 05/01/2014 1322   CO2 25 07/24/2023 0829   CO2 27 05/01/2014 1322   GLUCOSE 134 (H) 07/24/2023 0829   GLUCOSE 102 (H) 05/01/2014 1322   BUN 12 07/24/2023 0829   BUN 16 05/01/2014 1322   CREATININE 1.16 07/24/2023 0829   CREATININE 1.48 (H) 05/29/2014 0949   CALCIUM 8.3 (L) 07/24/2023 0829   CALCIUM 9.1 05/01/2014 1322   PROT 6.2 (L) 07/24/2023 0829   PROT 7.5 05/01/2014 1322   ALBUMIN 3.5 07/24/2023 0829   ALBUMIN 3.6 05/01/2014 1322   AST 12 (L) 07/24/2023 0829   ALT 14 07/24/2023 0829   ALT 13 (L) 05/01/2014 1322   ALKPHOS 79 07/24/2023 0829   ALKPHOS 47 05/01/2014 1322   BILITOT  0.9 07/24/2023 0829    GFRNONAA >60 07/24/2023 0829   GFRNONAA 47 (L) 05/29/2014 0949   GFRAA 47 (L) 10/21/2019 1106   GFRAA 55 (L) 05/29/2014 0949    No results found for: SPEP, UPEP  Lab Results  Component Value Date   WBC 6.4 07/24/2023   NEUTROABS 5.1 07/24/2023   HGB 12.7 (L) 07/24/2023   HCT 38.3 (L) 07/24/2023   MCV 94.8 07/24/2023   PLT 253 07/24/2023      Chemistry      Component Value Date/Time   NA 138 07/24/2023 0829   NA 139 05/01/2014 1322   K 3.2 (L) 07/24/2023 0829   K 4.4 05/01/2014 1322   CL 105 07/24/2023 0829   CL 107 05/01/2014 1322   CO2 25 07/24/2023 0829   CO2 27 05/01/2014 1322   BUN 12 07/24/2023 0829   BUN 16 05/01/2014 1322   CREATININE 1.16 07/24/2023 0829   CREATININE 1.48 (H) 05/29/2014 0949      Component Value Date/Time   CALCIUM 8.3 (L) 07/24/2023 0829   CALCIUM 9.1 05/01/2014 1322   ALKPHOS 79 07/24/2023 0829   ALKPHOS 47 05/01/2014 1322   AST 12 (L) 07/24/2023 0829   ALT 14 07/24/2023 0829   ALT 13 (L) 05/01/2014 1322   BILITOT 0.9 07/24/2023 0829       RADIOGRAPHIC STUDIES: I have personally reviewed the radiological images as listed and agreed with the findings in the report. No results found.   ASSESSMENT & PLAN:  Multiple myeloma in relapse (HCC) #Multiple myeloma-PROGRESSED ON  maintenance Revlimid .  JAN 2025-  M-protein- 1.3[April 2023- NEG]; kappa=113 lambda light chain ratio=2.5 [July 2020- 2.0]. RISING- ca- 10.4. # APRIL 2025- . Diffuse lytic myelomatous lesions throughout the skeleton. Most of these lesions demonstrate hypermetabolism; Healing left mid clavicle fracture, likely pathologic; Early nondisplaced pathological fracture involving the right mid clavicle; Left pubic bone lesion has a defect in the outer cortex and this could be a pending pathologic fracture; . No findings for extraskeletal myeloma. Bone marrow Bx- on APRIL 25th, 2025- Pending results.  April 2025 discontinue lenalidomide . On Dara Velcade  dexamethasone .    #  proceed with Dara Velcade  dexamethasone .  cycle #3 day 8 today. Labs-CBC/chemistries were reviewed with the patient.  Patient interested in CAR-T therapy in mid-sep 2025. REMINDED THE Pt to inform [facility] of his plans. Continue current therapy for now.  # Diarrhea intermittent/hiccups/weight loss- improved- continue 20 mg of dexamethasone - stable.  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 Aaron Aas April 2024- Vit D 38- recommend Vit D 50,K/weekly-  HOLD QUINAPRIL -  stable-weight gain/leg swelling-recommend leg elevation compression stockings.   # Acute back pain: Progressive bone lesions on PET scan April 2025-status post Zometa  improved with tramadol  at bedtime-improved/  stable  # DM- on OHA -recommend close monitoring of blood glucose levels. Recommend bringing log at this time. stable  # Hypothyroidism: on synthroid - stable  # infection prophylaxis: Continue  acyclovir  twice a day.  stable  # ACP [06/05/2023]:  full code.   For 1st 2 cycles- Velcade  twice weekly- Dara-Velcade  q Weekly x 4 cycles- starting # 5-8 dara q 3W- MD only on day-1; cycles 9+- dara q 28 days  valcade;  Zometa  q 6 M-last 06/12/2023-  PS;  Disposition: AM preference # q 4 week- MM panel; K/l light chains # chemo today;  chemo-  # follow up in 1 week- MD:  labs- cbc/cmp; chemo;-zometa  # follow up in 2 week- MD- labs- cbc/cmp; chemo; #  follow up in 3 week- MD: labs- cbc/cmp; chemo;  #  follow up in 5 week- MD: labs- cbc/cmp; -chemo;  #  follow up in 6  week-  labs- cbc/cmp; -chemo; Dr.B     No orders of the defined types were placed in this encounter.   All questions were answered. The patient knows to call the clinic with any problems, questions or concerns.      [name], MD 07/24/2023 9:47 AM

## 2023-07-25 ENCOUNTER — Other Ambulatory Visit: Payer: Self-pay

## 2023-07-27 ENCOUNTER — Other Ambulatory Visit: Payer: Self-pay | Admitting: *Deleted

## 2023-07-27 DIAGNOSIS — C9002 Multiple myeloma in relapse: Secondary | ICD-10-CM

## 2023-07-30 ENCOUNTER — Encounter: Payer: Self-pay | Admitting: Internal Medicine

## 2023-07-31 ENCOUNTER — Inpatient Hospital Stay (HOSPITAL_BASED_OUTPATIENT_CLINIC_OR_DEPARTMENT_OTHER): Admitting: Internal Medicine

## 2023-07-31 ENCOUNTER — Inpatient Hospital Stay

## 2023-07-31 ENCOUNTER — Encounter: Payer: Self-pay | Admitting: Internal Medicine

## 2023-07-31 VITALS — BP 139/84 | HR 81 | Temp 96.6°F | Resp 16 | Ht 70.0 in

## 2023-07-31 DIAGNOSIS — C9002 Multiple myeloma in relapse: Secondary | ICD-10-CM

## 2023-07-31 DIAGNOSIS — Z5111 Encounter for antineoplastic chemotherapy: Secondary | ICD-10-CM | POA: Diagnosis not present

## 2023-07-31 LAB — CMP (CANCER CENTER ONLY)
ALT: 13 U/L (ref 0–44)
AST: 12 U/L — ABNORMAL LOW (ref 15–41)
Albumin: 3.8 g/dL (ref 3.5–5.0)
Alkaline Phosphatase: 76 U/L (ref 38–126)
Anion gap: 9 (ref 5–15)
BUN: 14 mg/dL (ref 8–23)
CO2: 26 mmol/L (ref 22–32)
Calcium: 8.7 mg/dL — ABNORMAL LOW (ref 8.9–10.3)
Chloride: 102 mmol/L (ref 98–111)
Creatinine: 1.1 mg/dL (ref 0.61–1.24)
GFR, Estimated: >60 mL/min (ref >60)
Glucose, Bld: 131 mg/dL — ABNORMAL HIGH (ref 70–99)
Potassium: 4.3 mmol/L (ref 3.5–5.1)
Sodium: 137 mmol/L (ref 135–145)
Total Bilirubin: 1.1 mg/dL (ref 0.0–1.2)
Total Protein: 6.3 g/dL — ABNORMAL LOW (ref 6.5–8.1)

## 2023-07-31 LAB — CBC WITH DIFFERENTIAL (CANCER CENTER ONLY)
Abs Immature Granulocytes: 0.04 10*3/uL (ref 0.00–0.07)
Basophils Absolute: 0 10*3/uL (ref 0.0–0.1)
Basophils Relative: 0 %
Eosinophils Absolute: 0.1 10*3/uL (ref 0.0–0.5)
Eosinophils Relative: 1 %
HCT: 38.6 % — ABNORMAL LOW (ref 39.0–52.0)
Hemoglobin: 12.9 g/dL — ABNORMAL LOW (ref 13.0–17.0)
Immature Granulocytes: 1 %
Lymphocytes Relative: 9 %
Lymphs Abs: 0.5 10*3/uL — ABNORMAL LOW (ref 0.7–4.0)
MCH: 31.7 pg (ref 26.0–34.0)
MCHC: 33.4 g/dL (ref 30.0–36.0)
MCV: 94.8 fL (ref 80.0–100.0)
Monocytes Absolute: 0.6 10*3/uL (ref 0.1–1.0)
Monocytes Relative: 11 %
Neutro Abs: 4.3 10*3/uL (ref 1.7–7.7)
Neutrophils Relative %: 78 %
Platelet Count: 246 10*3/uL (ref 150–400)
RBC: 4.07 MIL/uL — ABNORMAL LOW (ref 4.22–5.81)
RDW: 17.5 % — ABNORMAL HIGH (ref 11.5–15.5)
WBC Count: 5.6 10*3/uL (ref 4.0–10.5)
nRBC: 0 % (ref 0.0–0.2)

## 2023-07-31 MED ORDER — ACETAMINOPHEN 325 MG PO TABS
650.0000 mg | ORAL_TABLET | Freq: Once | ORAL | Status: AC
  Administered 2023-07-31: 650 mg via ORAL
  Filled 2023-07-31: qty 2

## 2023-07-31 MED ORDER — DIPHENHYDRAMINE HCL 25 MG PO CAPS
50.0000 mg | ORAL_CAPSULE | Freq: Once | ORAL | Status: AC
  Administered 2023-07-31: 50 mg via ORAL
  Filled 2023-07-31: qty 2

## 2023-07-31 MED ORDER — DEXAMETHASONE 4 MG PO TABS
20.0000 mg | ORAL_TABLET | Freq: Once | ORAL | Status: AC
  Administered 2023-07-31: 20 mg via ORAL
  Filled 2023-07-31: qty 5

## 2023-07-31 MED ORDER — DARATUMUMAB-HYALURONIDASE-FIHJ 1800-30000 MG-UT/15ML ~~LOC~~ SOLN
1800.0000 mg | Freq: Once | SUBCUTANEOUS | Status: AC
  Administered 2023-07-31: 1800 mg via SUBCUTANEOUS
  Filled 2023-07-31: qty 15

## 2023-07-31 NOTE — Progress Notes (Signed)
 [facility] OFFICE PROGRESS NOTE  Patient Care Team: Rudolpho Norleen BIRCH, MD as PCP - General (Internal Medicine) Ivin Kocher, MD as Consulting Physician (Dermatology) Rennie Cindy SAUNDERS, MD as Consulting Physician (Hematology and Oncology)   Cancer Staging  Multiple myeloma in relapse [facility]) Staging form: Plasma Cell Myeloma and Plasma Cell Disorders, AJCC 8th Edition - Clinical: No stage assigned - Unsigned - Clinical: RISS Stage I (Beta-2 -microglobulin (mg/L): 2.4, Albumin (g/dL): 3.5, ISS: Stage I, High-risk cytogenetics: Absent, LDH: Normal) - Signed by Rennie Cindy SAUNDERS, MD on 06/19/2023 Beta 2 microglobulin range (mg/L): Less than 3.5 Albumin range (g/dL): Greater than or equal to 3.5 Cytogenetics: No abnormalities    Oncology History Overview Note   # April 2014- MULTIPLE MYELOMA [IgG- 2.2gm/dl; 59-49% plasma cell; Cyto-N; FISH- gain of chr.9, 15 & ccnd1/11q13] s/p RVD x6 [sep 2014; BMBx- ~5% plasma cells]; Maint Rev-DEX-Zometa ; March 2015-Stopped Dex Cont- Rev-Zometa ; July 2016- Rev 25mg ; NOV 4th  2016-HOLD; BMT eval at [facility] [Dr.Woods]; declined BMT.   # Re-START FEB 2017 Rev 15mg  3 W- on & 1 w OFF; HELD Rev June- Oct 2020- sec to worsening renal function [Dr.Singh]; SEP 2021- Rev 15 mg 2 w-On & 2 w-OFF. STOP REVLIMD in APRIL 2025-progression.    # APRIL 2025- . Diffuse lytic myelomatous lesions throughout the skeleton. Most of these lesions demonstrate hypermetabolism; Healing left mid clavicle fracture, likely pathologic; Early nondisplaced pathological fracture involving the right mid clavicle; Left pubic bone lesion has a defect in the outer cortex and this could be a pending pathologic fracture; . No findings for extraskeletal myeloma. Bone marrow Bx- on APRIL 25th, 2025- Pending.  # April 2025 discontinue lenalidomide .  # APRIL 29th, 2025- start Dara-Velcade  -Dex   # Zometa   # CKD [creat ~1.4; sec MM; Dr.Singh];  DM  -------------------------------------------------------------------------   DIAGNOSIS: [name] ] MULTIPLE MYELOMA  GOALS: control     Multiple myeloma in remission (HCC)  10/01/2015 Initial Diagnosis   Multiple myeloma in remission (HCC)   Multiple myeloma in relapse (HCC)  05/14/2023 Initial Diagnosis   Multiple myeloma in relapse (HCC)   06/05/2023 -  Chemotherapy   Patient is on Treatment Plan : MYELOMA RELAPSED / REFRACTORY Daratumumab  SQ + Bortezomib  + Dexamethasone  (DaraVd) q21d / Daratumumab  SQ q28d      06/05/2023 Cancer Staging   Staging form: Plasma Cell Myeloma and Plasma Cell Disorders, AJCC 8th Edition - Clinical: RISS Stage I (Beta-2 -microglobulin (mg/L): 2.4, Albumin (g/dL): 3.5, ISS: Stage I, High-risk cytogenetics: Absent, LDH: Normal) - Signed by Rennie Cindy SAUNDERS, MD on 06/19/2023 Beta 2 microglobulin range (mg/L): Less than 3.5 Albumin range (g/dL): Greater than or equal to 3.5 Cytogenetics: No abnormalities    INTERVAL HISTORY: pt is  with wife.  Ambulating independently.   Alan Alexander 78 y.o.  male pleasant patient above history of recurrent multiple myeloma; CKD stage III; DM on dara-VD  is to  proceed with chemotherapy.   Noted to have improvement of his hip pain. Patient currently taking  tramadol  1 pills  in AM and at bedtime prn.   Patient denies any skin rash. Patient also denies any tingling or numbness.  Mild swelling in the legs.    Review of Systems  Constitutional:  Negative for chills, diaphoresis, fever, malaise/fatigue and weight loss.  HENT:  Negative for nosebleeds and sore throat.   Eyes:  Negative for double vision.  Respiratory:  Negative for cough, hemoptysis, sputum production, shortness of breath and wheezing.   Cardiovascular:  Positive for leg swelling. Negative for chest pain, palpitations and orthopnea.  Gastrointestinal:  Negative for abdominal pain, blood in stool, constipation, diarrhea, heartburn, melena, nausea and  vomiting.  Genitourinary:  Negative for dysuria, frequency and urgency.  Musculoskeletal:  Positive for back pain and joint pain.  Skin: Negative.  Negative for itching and rash.  Neurological:  Negative for dizziness, tingling, focal weakness, weakness and headaches.  Endo/Heme/Allergies:  Does not bruise/bleed easily.  Psychiatric/Behavioral:  Negative for depression. The patient is not nervous/anxious and does not have insomnia.       PAST MEDICAL HISTORY :  Past Medical History:  Diagnosis Date   Diabetes mellitus without complication (HCC)    GERD (gastroesophageal reflux disease)    Hyperlipidemia    Hypertension    Multiple myeloma (HCC)    Obesity    Thyroid cancer (HCC)    Thyroid disease     PAST SURGICAL HISTORY :   Past Surgical History:  Procedure Laterality Date   BONE MARROW BIOPSY  2014   CATARACT EXTRACTION BILATERAL W/ ANTERIOR VITRECTOMY     IR BONE MARROW BIOPSY & ASPIRATION  06/01/2023    FAMILY HISTORY :   Family History  Problem Relation Age of Onset   Cancer Father 79   Diabetes Mother     SOCIAL HISTORY:   Social History   Tobacco Use   Smoking status: Never   Smokeless tobacco: Never  Substance Use Topics   Alcohol use: No   Drug use: No    ALLERGIES:  has no known allergies.  MEDICATIONS:  Current Outpatient Medications  Medication Sig Dispense Refill   acyclovir  (ZOVIRAX ) 400 MG tablet Take 1 tablet (400 mg total) by mouth 2 (two) times daily. 120 tablet 2   amLODipine  (NORVASC ) 10 MG tablet Take 10 mg by mouth daily.     aspirin EC 81 MG tablet Take 81 mg by mouth daily.     atorvastatin  (LIPITOR) 20 MG tablet Take 20 mg by mouth daily.     chlorproMAZINE  (THORAZINE ) 25 MG tablet Take 1 tablet (25 mg total) by mouth 3 (three) times daily. 60 tablet 1   Cyanocobalamin 1000 MCG TBCR Take by mouth.     cyclobenzaprine  (FLEXERIL ) 5 MG tablet Take 1 tablet (5 mg total) by mouth 3 (three) times daily as needed. 15 tablet 0    dapagliflozin propanediol (FARXIGA) 10 MG TABS tablet TAKE 10 MG BY MOUTH 1 (ONE) TIME EACH DAY IN THE MORNING     ergocalciferol  (VITAMIN D2) 1.25 MG (50000 UT) capsule Take 1 capsule (50,000 Units total) by mouth once a week. 12 capsule 1   glipiZIDE (GLUCOTROL) 5 MG tablet Take 5 mg by mouth every morning.     levothyroxine  (SYNTHROID ) 137 MCG tablet Take 1 tablet by mouth daily.      ondansetron  (ZOFRAN ) 8 MG tablet One pill every 8 hours as needed for nausea/vomitting. 40 tablet 1   ONETOUCH DELICA LANCETS 33G MISC Inject 1 Lancet as directed once daily.     potassium chloride  SA (K-DUR,KLOR-CON ) 20 MEQ tablet Take 20 mEq by mouth 2 (two) times daily.   0   prochlorperazine  (COMPAZINE ) 10 MG tablet Take 1 tablet (10 mg total) by mouth every 6 (six) hours as needed for nausea or vomiting. 40 tablet 1   sodium bicarbonate 650 MG tablet Take 650 mg by mouth 2 (two) times daily.     traMADol  (ULTRAM ) 50 MG tablet Take 1 tablet (50 mg total) by  mouth every 12 (twelve) hours as needed. 60 tablet 0   No current facility-administered medications for this visit.   Facility-Administered Medications Ordered in Other Visits  Medication Dose Route Frequency Provider Last Rate Last Admin   acetaminophen  (TYLENOL ) tablet 650 mg  650 mg Oral Once Allen, Lauren G, NP       daratumumab -hyaluronidase -fihj (DARZALEX  FASPRO) 1800-30000 MG-UT/15ML chemo SQ injection 1,800 mg  1,800 mg Subcutaneous Once Allen, Lauren G, NP       dexamethasone  (DECADRON ) tablet 20 mg  20 mg Oral Once Allen, Lauren G, NP       diphenhydrAMINE  (BENADRYL ) capsule 50 mg  50 mg Oral Once Dasie Tinnie MATSU, NP        PHYSICAL EXAMINATION: ECOG PERFORMANCE STATUS: 0 - Asymptomatic  BP 139/84 (BP Location: Left Arm, Patient Position: Sitting, Cuff Size: Large)   Pulse 81   Temp (!) 96.6 F (35.9 C) (Tympanic)   Resp 16   Ht 5' 10 (1.778 m)   SpO2 100%   BMI 28.74 kg/m   There were no vitals filed for this  visit.     Physical Exam Vitals and nursing note reviewed.  HENT:     Head: Normocephalic and atraumatic.     Mouth/Throat:     Pharynx: Oropharynx is clear. No oropharyngeal exudate.   Eyes:     Extraocular Movements: Extraocular movements intact.     Pupils: Pupils are equal, round, and reactive to light.    Cardiovascular:     Rate and Rhythm: Normal rate and regular rhythm.  Pulmonary:     Effort: No respiratory distress.     Breath sounds: No wheezing.     Comments: Decreased breath sounds bilaterally.  Abdominal:     General: Bowel sounds are normal. There is no distension.     Palpations: Abdomen is soft. There is no mass.     Tenderness: There is no abdominal tenderness. There is no guarding or rebound.   Musculoskeletal:        General: No tenderness. Normal range of motion.     Cervical back: Normal range of motion and neck supple.   Skin:    General: Skin is warm.   Neurological:     General: No focal deficit present.     Mental Status: He is alert and oriented to person, place, and time.   Psychiatric:        Mood and Affect: Affect normal.        Behavior: Behavior normal.        Judgment: Judgment normal.       LABORATORY DATA:  I have reviewed the data as listed    Component Value Date/Time   NA 137 07/31/2023 0839   NA 139 05/01/2014 1322   K 4.3 07/31/2023 0839   K 4.4 05/01/2014 1322   CL 102 07/31/2023 0839   CL 107 05/01/2014 1322   CO2 26 07/31/2023 0839   CO2 27 05/01/2014 1322   GLUCOSE 131 (H) 07/31/2023 0839   GLUCOSE 102 (H) 05/01/2014 1322   BUN 14 07/31/2023 0839   BUN 16 05/01/2014 1322   CREATININE 1.10 07/31/2023 0839   CREATININE 1.48 (H) 05/29/2014 0949   CALCIUM 8.7 (L) 07/31/2023 0839   CALCIUM 9.1 05/01/2014 1322   PROT 6.3 (L) 07/31/2023 0839   PROT 7.5 05/01/2014 1322   ALBUMIN 3.8 07/31/2023 0839   ALBUMIN 3.6 05/01/2014 1322   AST 12 (L) 07/31/2023 0839   ALT 13 07/31/2023  0839   ALT 13 (L) 05/01/2014  1322   ALKPHOS 76 07/31/2023 0839   ALKPHOS 47 05/01/2014 1322   BILITOT 1.1 07/31/2023 0839   GFRNONAA >60 07/31/2023 0839   GFRNONAA 47 (L) 05/29/2014 0949   GFRAA 47 (L) 10/21/2019 1106   GFRAA 55 (L) 05/29/2014 0949    No results found for: SPEP, UPEP  Lab Results  Component Value Date   WBC 5.6 07/31/2023   NEUTROABS 4.3 07/31/2023   HGB 12.9 (L) 07/31/2023   HCT 38.6 (L) 07/31/2023   MCV 94.8 07/31/2023   PLT 246 07/31/2023      Chemistry      Component Value Date/Time   NA 137 07/31/2023 0839   NA 139 05/01/2014 1322   K 4.3 07/31/2023 0839   K 4.4 05/01/2014 1322   CL 102 07/31/2023 0839   CL 107 05/01/2014 1322   CO2 26 07/31/2023 0839   CO2 27 05/01/2014 1322   BUN 14 07/31/2023 0839   BUN 16 05/01/2014 1322   CREATININE 1.10 07/31/2023 0839   CREATININE 1.48 (H) 05/29/2014 0949      Component Value Date/Time   CALCIUM 8.7 (L) 07/31/2023 0839   CALCIUM 9.1 05/01/2014 1322   ALKPHOS 76 07/31/2023 0839   ALKPHOS 47 05/01/2014 1322   AST 12 (L) 07/31/2023 0839   ALT 13 07/31/2023 0839   ALT 13 (L) 05/01/2014 1322   BILITOT 1.1 07/31/2023 0839       RADIOGRAPHIC STUDIES: I have personally reviewed the radiological images as listed and agreed with the findings in the report. No results found.   ASSESSMENT & PLAN:  Multiple myeloma in relapse (HCC) #Multiple myeloma-PROGRESSED ON  maintenance Revlimid .  JAN 2025-  M-protein- 1.3[April 2023- NEG]; kappa=113 lambda light chain ratio=2.5 [July 2020- 2.0]. RISING- ca- 10.4. # APRIL 2025- . Diffuse lytic myelomatous lesions throughout the skeleton. Most of these lesions demonstrate hypermetabolism; Healing left mid clavicle fracture, likely pathologic; Early nondisplaced pathological fracture involving the right mid clavicle; Left pubic bone lesion has a defect in the outer cortex and this could be a pending pathologic fracture; . No findings for extraskeletal myeloma. Bone marrow Bx- on APRIL 25th, 2025-  Pending results.  April 2025 discontinue lenalidomide . On Dara Velcade  dexamethasone .    # proceed with Dara Velcade  dexamethasone .  cycle #3 day 8 today. Labs-CBC/chemistries were reviewed with the patient.  Patient interested in CAR-T therapy in mid-sep 2025. Patient reached out to [facility] - awaiting labs on july31st at [facility].  Continue current therapy for now.  # Diarrhea intermittent/hiccups/weight loss- improved- continue 20 mg of dexamethasone - stable.  # Renal: Hyperkalemia/hypokalemia- rCONTINUE  Kdur 2 pills  BID #  baseline creatinine 1.3-1.4; peak 1.8]. SABRA April 2024- Vit D 38- recommend Vit D 50,K/weekly-  CONTINUE TO HOLD QUINAPRIL -  stable-weight gain/leg swelling-recommend leg elevation compression stockings.   # Acute back pain: Progressive bone lesions on PET scan April 2025-status post Zometa  improved with tramadol  at bedtime-improved/  stable  # DM- on OHA -recommend close monitoring of blood glucose levels. Recommend bringing log at this time.  stable  # Hypothyroidism: on synthroid -  stable  # infection prophylaxis: Continue  acyclovir  twice a day. stable  # ACP [06/05/2023]:  full code.   For 1st 2 cycles- Velcade  twice weekly- Dara-Velcade  q Weekly x 4 cycles- starting # 5-8 dara q 3W- MD only on day-1; cycles 9+- dara q 28 days  valcade;  Zometa  q 6 M-last 06/12/2023-  PS;  Disposition: AM preference # q 4 week- MM panel; K/l light chains # chemo today;  chemo- HOLD Zometa   # follow up in 1 week- MD: labs- cbc/cmp; chemo;-Zometa  # follow up in 2 week- MD- labs- cbc/cmp; chemo;  #  follow up in 4 week- MD: labs- cbc/cmp; chemo;  #  follow up in 5 week- MD: labs- cbc/cmp; -chemo- Dr.B     Orders Placed This Encounter  Procedures   CBC with Differential (Cancer Center Only)    Standing Status:   Future    Expected Date:   09/04/2023    Expiration Date:   09/03/2024   CMP (Cancer Center only)    Standing Status:   Future    Expected Date:   09/04/2023     Expiration Date:   09/03/2024    All questions were answered. The patient knows to call the clinic with any problems, questions or concerns.      Cindy JONELLE Joe, MD 07/31/2023 9:42 AM

## 2023-07-31 NOTE — Assessment & Plan Note (Signed)
#  Multiple myeloma-PROGRESSED ON  maintenance Revlimid .  JAN 2025-  M-protein- 1.3[April 2023- NEG]; kappa=113 lambda light chain ratio=2.5 [July 2020- 2.0]. RISING- ca- 10.4. # APRIL 2025- . Diffuse lytic myelomatous lesions throughout the skeleton. Most of these lesions demonstrate hypermetabolism; Healing left mid clavicle fracture, likely pathologic; Early nondisplaced pathological fracture involving the right mid clavicle; Left pubic bone lesion has a defect in the outer cortex and this could be a pending pathologic fracture; . No findings for extraskeletal myeloma. Bone marrow Bx- on APRIL 25th, 2025- Pending results.  April 2025 discontinue lenalidomide . On Dara Velcade  dexamethasone .    # proceed with Dara Velcade  dexamethasone .  cycle #3 day 8 today. Labs-CBC/chemistries were reviewed with the patient.  Patient interested in CAR-T therapy in mid-sep 2025. Patient reached out to [facility] - awaiting labs on july31st at [facility].  Continue current therapy for now.  # Diarrhea intermittent/hiccups/weight loss- improved- continue 20 mg of dexamethasone - stable.  # Renal: Hyperkalemia/hypokalemia- rCONTINUE  Kdur 2 pills  BID #  baseline creatinine 1.3-1.4; peak 1.8]. SABRA April 2024- Vit D 38- recommend Vit D 50,K/weekly-  CONTINUE TO HOLD QUINAPRIL -  stable-weight gain/leg swelling-recommend leg elevation compression stockings.   # Acute back pain: Progressive bone lesions on PET scan April 2025-status post Zometa  improved with tramadol  at bedtime-improved/  stable  # DM- on OHA -recommend close monitoring of blood glucose levels. Recommend bringing log at this time.  stable  # Hypothyroidism: on synthroid -  stable  # infection prophylaxis: Continue  acyclovir  twice a day. stable  # ACP [06/05/2023]:  full code.   For 1st 2 cycles- Velcade  twice weekly- Dara-Velcade  q Weekly x 4 cycles- starting # 5-8 dara q 3W- MD only on day-1; cycles 9+- dara q 28 days  valcade;  Zometa  q 6 M-last  06/12/2023-  PS;  Disposition: AM preference # q 4 week- MM panel; K/l light chains # chemo today;  chemo- HOLD Zometa   # follow up in 1 week- MD: labs- cbc/cmp; chemo;-Zometa  # follow up in 2 week- MD- labs- cbc/cmp; chemo;  #  follow up in 4 week- MD: labs- cbc/cmp; chemo;  #  follow up in 5 week- MD: labs- cbc/cmp; -chemo- Dr.B

## 2023-07-31 NOTE — Progress Notes (Signed)
 No concerns today

## 2023-08-01 ENCOUNTER — Other Ambulatory Visit: Payer: Self-pay

## 2023-08-01 LAB — KAPPA/LAMBDA LIGHT CHAINS
Kappa free light chain: 10.5 mg/L (ref 3.3–19.4)
Kappa, lambda light chain ratio: 1.78 — ABNORMAL HIGH (ref 0.26–1.65)
Lambda free light chains: 5.9 mg/L (ref 5.7–26.3)

## 2023-08-02 LAB — MULTIPLE MYELOMA PANEL, SERUM
Albumin SerPl Elph-Mcnc: 3.5 g/dL (ref 2.9–4.4)
Albumin/Glob SerPl: 1.6 (ref 0.7–1.7)
Alpha 1: 0.2 g/dL (ref 0.0–0.4)
Alpha2 Glob SerPl Elph-Mcnc: 0.7 g/dL (ref 0.4–1.0)
B-Globulin SerPl Elph-Mcnc: 0.9 g/dL (ref 0.7–1.3)
Gamma Glob SerPl Elph-Mcnc: 0.5 g/dL (ref 0.4–1.8)
Globulin, Total: 2.2 g/dL (ref 2.2–3.9)
IgA: 88 mg/dL (ref 61–437)
IgG (Immunoglobin G), Serum: 603 mg/dL (ref 603–1613)
IgM (Immunoglobulin M), Srm: 15 mg/dL (ref 15–143)
M Protein SerPl Elph-Mcnc: 0.2 g/dL — ABNORMAL HIGH
Total Protein ELP: 5.7 g/dL — ABNORMAL LOW (ref 6.0–8.5)

## 2023-08-07 ENCOUNTER — Inpatient Hospital Stay (HOSPITAL_BASED_OUTPATIENT_CLINIC_OR_DEPARTMENT_OTHER): Admitting: Internal Medicine

## 2023-08-07 ENCOUNTER — Inpatient Hospital Stay: Attending: Internal Medicine

## 2023-08-07 ENCOUNTER — Inpatient Hospital Stay

## 2023-08-07 ENCOUNTER — Encounter: Payer: Self-pay | Admitting: Internal Medicine

## 2023-08-07 DIAGNOSIS — Z7952 Long term (current) use of systemic steroids: Secondary | ICD-10-CM | POA: Diagnosis not present

## 2023-08-07 DIAGNOSIS — Z5112 Encounter for antineoplastic immunotherapy: Secondary | ICD-10-CM | POA: Diagnosis not present

## 2023-08-07 DIAGNOSIS — Z5111 Encounter for antineoplastic chemotherapy: Secondary | ICD-10-CM | POA: Diagnosis present

## 2023-08-07 DIAGNOSIS — C9002 Multiple myeloma in relapse: Secondary | ICD-10-CM | POA: Insufficient documentation

## 2023-08-07 DIAGNOSIS — R066 Hiccough: Secondary | ICD-10-CM | POA: Insufficient documentation

## 2023-08-07 DIAGNOSIS — Z79620 Long term (current) use of immunosuppressive biologic: Secondary | ICD-10-CM | POA: Insufficient documentation

## 2023-08-07 DIAGNOSIS — E875 Hyperkalemia: Secondary | ICD-10-CM | POA: Insufficient documentation

## 2023-08-07 DIAGNOSIS — M549 Dorsalgia, unspecified: Secondary | ICD-10-CM | POA: Insufficient documentation

## 2023-08-07 DIAGNOSIS — E119 Type 2 diabetes mellitus without complications: Secondary | ICD-10-CM | POA: Diagnosis not present

## 2023-08-07 DIAGNOSIS — E876 Hypokalemia: Secondary | ICD-10-CM | POA: Diagnosis not present

## 2023-08-07 DIAGNOSIS — E039 Hypothyroidism, unspecified: Secondary | ICD-10-CM | POA: Diagnosis not present

## 2023-08-07 DIAGNOSIS — R197 Diarrhea, unspecified: Secondary | ICD-10-CM | POA: Diagnosis not present

## 2023-08-07 DIAGNOSIS — C9001 Multiple myeloma in remission: Secondary | ICD-10-CM

## 2023-08-07 LAB — CBC WITH DIFFERENTIAL (CANCER CENTER ONLY)
Abs Immature Granulocytes: 0.02 10*3/uL (ref 0.00–0.07)
Basophils Absolute: 0 10*3/uL (ref 0.0–0.1)
Basophils Relative: 0 %
Eosinophils Absolute: 0.1 10*3/uL (ref 0.0–0.5)
Eosinophils Relative: 1 %
HCT: 39.1 % (ref 39.0–52.0)
Hemoglobin: 12.8 g/dL — ABNORMAL LOW (ref 13.0–17.0)
Immature Granulocytes: 0 %
Lymphocytes Relative: 8 %
Lymphs Abs: 0.4 10*3/uL — ABNORMAL LOW (ref 0.7–4.0)
MCH: 31.4 pg (ref 26.0–34.0)
MCHC: 32.7 g/dL (ref 30.0–36.0)
MCV: 96.1 fL (ref 80.0–100.0)
Monocytes Absolute: 0.7 10*3/uL (ref 0.1–1.0)
Monocytes Relative: 12 %
Neutro Abs: 4.4 10*3/uL (ref 1.7–7.7)
Neutrophils Relative %: 79 %
Platelet Count: 313 10*3/uL (ref 150–400)
RBC: 4.07 MIL/uL — ABNORMAL LOW (ref 4.22–5.81)
RDW: 17.9 % — ABNORMAL HIGH (ref 11.5–15.5)
WBC Count: 5.7 10*3/uL (ref 4.0–10.5)
nRBC: 0 % (ref 0.0–0.2)

## 2023-08-07 LAB — CMP (CANCER CENTER ONLY)
ALT: 15 U/L (ref 0–44)
AST: 13 U/L — ABNORMAL LOW (ref 15–41)
Albumin: 3.8 g/dL (ref 3.5–5.0)
Alkaline Phosphatase: 74 U/L (ref 38–126)
Anion gap: 8 (ref 5–15)
BUN: 15 mg/dL (ref 8–23)
CO2: 22 mmol/L (ref 22–32)
Calcium: 9 mg/dL (ref 8.9–10.3)
Chloride: 107 mmol/L (ref 98–111)
Creatinine: 1.23 mg/dL (ref 0.61–1.24)
GFR, Estimated: 60 mL/min — ABNORMAL LOW (ref >60)
Glucose, Bld: 146 mg/dL — ABNORMAL HIGH (ref 70–99)
Potassium: 4.5 mmol/L (ref 3.5–5.1)
Sodium: 137 mmol/L (ref 135–145)
Total Bilirubin: 0.8 mg/dL (ref 0.0–1.2)
Total Protein: 6.2 g/dL — ABNORMAL LOW (ref 6.5–8.1)

## 2023-08-07 MED ORDER — DEXAMETHASONE 4 MG PO TABS
20.0000 mg | ORAL_TABLET | Freq: Once | ORAL | Status: AC
Start: 2023-08-07 — End: 2023-08-07
  Administered 2023-08-07: 20 mg via ORAL
  Filled 2023-08-07: qty 5

## 2023-08-07 MED ORDER — ZOLEDRONIC ACID 4 MG/5ML IV CONC
3.0000 mg | Freq: Once | INTRAVENOUS | Status: AC
  Administered 2023-08-07: 3 mg via INTRAVENOUS
  Filled 2023-08-07: qty 3.75

## 2023-08-07 MED ORDER — DARATUMUMAB-HYALURONIDASE-FIHJ 1800-30000 MG-UT/15ML ~~LOC~~ SOLN
1800.0000 mg | Freq: Once | SUBCUTANEOUS | Status: AC
  Administered 2023-08-07: 1800 mg via SUBCUTANEOUS
  Filled 2023-08-07: qty 15

## 2023-08-07 MED ORDER — DIPHENHYDRAMINE HCL 25 MG PO CAPS
50.0000 mg | ORAL_CAPSULE | Freq: Once | ORAL | Status: AC
  Administered 2023-08-07: 50 mg via ORAL
  Filled 2023-08-07: qty 2

## 2023-08-07 MED ORDER — ACETAMINOPHEN 325 MG PO TABS
650.0000 mg | ORAL_TABLET | Freq: Once | ORAL | Status: AC
  Administered 2023-08-07: 650 mg via ORAL
  Filled 2023-08-07: qty 2

## 2023-08-07 MED ORDER — BORTEZOMIB CHEMO SQ INJECTION 3.5 MG (2.5MG/ML)
1.3000 mg/m2 | Freq: Once | INTRAMUSCULAR | Status: AC
  Administered 2023-08-07: 2.75 mg via SUBCUTANEOUS
  Filled 2023-08-07: qty 1.1

## 2023-08-07 NOTE — Assessment & Plan Note (Addendum)
#  Multiple myeloma-PROGRESSED ON  maintenance Revlimid .  JAN 2025-  M-protein- 1.3[April 2023- NEG]; kappa=113 lambda light chain ratio=2.5 [July 2020- 2.0]. RISING- ca- 10.4. # APRIL 2025- . Diffuse lytic myelomatous lesions throughout the skeleton. Most of these lesions demonstrate hypermetabolism; Healing left mid clavicle fracture, likely pathologic; Early nondisplaced pathological fracture involving the right mid clavicle; Left pubic bone lesion has a defect in the outer cortex and this could be a pending pathologic fracture; . No findings for extraskeletal myeloma. Bone marrow Bx- on APRIL 25th, 2025- Pending results.  April 2025 discontinue lenalidomide . On Dara Velcade  dexamethasone .    # proceed with Dara Velcade  dexamethasone .  cycle #3 day  today. Labs-CBC/chemistries were reviewed with the patient.  Patient interested in CAR-T therapy in mid-sep 2025. Patient reached out to [facility] - awaiting labs on july31st at [facility].  Continue current therapy for now.  # Diarrhea intermittent/hiccups/weight loss- improved- continue 20 mg of dexamethasone - stable.  # Renal: Hyperkalemia/hypokalemia- rCONTINUE  Kdur 2 pills  BID #  baseline creatinine 1.3-1.4; peak 1.8]. SABRA April 2024- Vit D 38- recommend Vit D 50,K/weekly-  CONTINUE TO HOLD QUINAPRIL -  stable-weight gain/leg swelling-recommend leg elevation compression stockings.   # Acute back pain: Progressive bone lesions on PET scan April 2025-status post Zometa  improved with tramadol  at bedtime-improved/  stable  # DM- on OHA -recommend close monitoring of blood glucose levels. Recommend bringing log at this time.  stable  # Hypothyroidism: on synthroid -  stable  # infection prophylaxis: Continue  acyclovir  twice a day. stable  # ACP [06/05/2023]:  full code.   For 1st 2 cycles- Velcade  twice weekly- Dara-Velcade  q Weekly x 4 cycles- starting # 5-8 dara q 3W- MD only on day-1; cycles 9+- dara q 28 days  valcade;  Zometa  q 6 M-last 08/07/2023-  PS;   Disposition: AM preference # q 4 week- MM panel; K/l light chains # chemo today;  chemo-  Zometa   # follow up in 1 week- MD: labs- cbc/cmp; chemo;   #  follow up in 3 week- MD: labs- cbc/cmp; chemo;  #  follow up in 4 week- MD: labs- cbc/cmp; -chemo- Dr.B

## 2023-08-07 NOTE — Progress Notes (Signed)
 No concerns today

## 2023-08-07 NOTE — Patient Instructions (Signed)
 [name] - A DEPT OF [facility]  Discharge Instructions: Thank you for choosing [facility] to provide your oncology and hematology care.  If you have a lab appointment with the Cancer Center, please go directly to the Cancer Center and check in at the registration area.  Wear comfortable clothing and clothing appropriate for easy access to any Portacath or PICC line.   We strive to give you quality time with your provider. You may need to reschedule your appointment if you arrive late (15 or more minutes).  Arriving late affects you and other patients whose appointments are after yours.  Also, if you miss three or more appointments without notifying the office, you may be dismissed from the clinic at the provider's discretion.      For prescription refill requests, have your pharmacy contact our office and allow 72 hours for refills to be completed.    Today you received the following chemotherapy and/or immunotherapy agents DARZALEX , VECLADE, and ZOMETA       To help prevent nausea and vomiting after your treatment, we encourage you to take your nausea medication as directed.  BELOW ARE SYMPTOMS THAT SHOULD BE REPORTED IMMEDIATELY: *FEVER GREATER THAN 100.4 F (38 C) OR HIGHER *CHILLS OR SWEATING *NAUSEA AND VOMITING THAT IS NOT CONTROLLED WITH YOUR NAUSEA MEDICATION *UNUSUAL SHORTNESS OF BREATH *UNUSUAL BRUISING OR BLEEDING *URINARY PROBLEMS (pain or burning when urinating, or frequent urination) *BOWEL PROBLEMS (unusual diarrhea, constipation, pain near the anus) TENDERNESS IN MOUTH AND THROAT WITH OR WITHOUT PRESENCE OF ULCERS (sore throat, sores in mouth, or a toothache) UNUSUAL RASH, SWELLING OR PAIN  UNUSUAL VAGINAL DISCHARGE OR ITCHING   Items with * indicate a potential emergency and should be followed up as soon as possible or go to the Emergency Department if any problems should occur.  Please show the CHEMOTHERAPY ALERT CARD  or IMMUNOTHERAPY ALERT CARD at check-in to the Emergency Department and triage nurse.  Should you have questions after your visit or need to cancel or reschedule your appointment, please contact [name] - A DEPT OF [facility]  [phone] and follow the prompts.  Office hours are 8:00 a.m. to 4:30 p.m. Monday - Friday. Please note that voicemails left after 4:00 p.m. may not be returned until the following business day.  We are closed weekends and major holidays. You have access to a nurse at all times for urgent questions. Please call the main number to the clinic [phone] and follow the prompts.  For any non-urgent questions, you may also contact your provider using MyChart. We now offer e-Visits for anyone 20 and older to request care online for non-urgent symptoms. For details visit [email or web address].   Also download the MyChart app! Go to the app store, search MyChart, open the app, select , and log in with your MyChart username and password.  Daratumumab  Injection What is this medication? DARATUMUMAB  (dar a toom ue mab) treats multiple myeloma, a type of bone marrow cancer. It works by helping your immune system slow or stop the spread of cancer cells. It is a monoclonal antibody. This medicine may be used for other purposes; ask your health care provider or pharmacist if you have questions. COMMON BRAND NAME(S): DARZALEX  What should I tell my care team before I take this medication? They need to know if you have any of these conditions: Hereditary fructose intolerance Infection, such as chickenpox, herpes,  hepatitis B Lung or breathing disease, such as asthma, COPD An unusual or allergic reaction to daratumumab , sorbitol, other medications, foods, dyes, or preservatives Pregnant or trying to get pregnant Breastfeeding How should I use this medication? This medication is injected into a vein. It is given by your care team in a  hospital or clinic setting. Talk to your care team about the use of this medication in children. Special care may be needed. Overdosage: If you think you have taken too much of this medicine contact a poison control center or emergency room at once. NOTE: This medicine is only for you. Do not share this medicine with others. What if I miss a dose? Keep appointments for follow-up doses. It is important not to miss your dose. Call your care team if you are unable to keep an appointment. What may interact with this medication? Interactions have not been studied. This list may not describe all possible interactions. Give your health care provider a list of all the medicines, herbs, non-prescription drugs, or dietary supplements you use. Also tell them if you smoke, drink alcohol, or use illegal drugs. Some items may interact with your medicine. What should I watch for while using this medication? Your condition will be monitored carefully while you are receiving this medication. This medication can cause serious allergic reactions. To reduce your risk, your care team may give you other medication to take before receiving this one. Be sure to follow the directions from your care team. This medication can affect the results of blood tests to match your blood type. These changes can last for up to 6 months after the final dose. Your care team will do blood tests to match your blood type before you start treatment. Tell all of your care team that you are being treated with this medication before receiving a blood transfusion. This medication can affect the results of some tests used to determine treatment response; extra tests may be needed to evaluate response. Talk to your care team if you wish to become pregnant or think you are pregnant. This medication can cause serious birth defects if taken during pregnancy and for 3 months after the last dose. A reliable form of contraception is recommended while  taking this medication and for 3 months after the last dose. Talk to your care team about effective forms of contraception. Do not breast-feed while taking this medication. What side effects may I notice from receiving this medication? Side effects that you should report to your care team as soon as possible: Allergic reactions--skin rash, itching, hives, swelling of the face, lips, tongue, or throat Infection--fever, chills, cough, sore throat, wounds that don't heal, pain or trouble when passing urine, general feeling of discomfort or being unwell Infusion reactions--chest pain, shortness of breath or trouble breathing, feeling faint or lightheaded Unusual bruising or bleeding Side effects that usually do not require medical attention (report to your care team if they continue or are bothersome): Constipation Diarrhea Fatigue Nausea Pain, tingling, or numbness in the hands or feet Swelling of the ankles, hands, or feet This list may not describe all possible side effects. Call your doctor for medical advice about side effects. You may report side effects to FDA at 1-800-FDA-1088. Where should I keep my medication? This medication is given in a hospital or clinic. It will not be stored at home. NOTE: This sheet is a summary. It may not cover all possible information. If you have ques  Bortezomib  Injection What is this  medication? BORTEZOMIB  (bor TEZ oh mib) treats lymphoma. It may also be used to treat multiple myeloma, a type of bone marrow cancer. It works by blocking a protein that causes cancer cells to grow and multiply. This helps to slow or stop the spread of cancer cells. This medicine may be used for other purposes; ask your health care provider or pharmacist if you have questions. COMMON BRAND NAME(S): BORUZU , Velcade  What should I tell my care team before I take this medication? They need to know if you have any of these conditions: Dehydration Diabetes Heart disease Liver  disease Tingling of the fingers or toes or other nerve disorder An unusual or allergic reaction to bortezomib , other medications, foods, dyes, or preservatives If you or your partner are pregnant or trying to get pregnant Breastfeeding How should I use this medication? This medication is injected into a vein or under the skin. It is given by your care team in a hospital or clinic setting. Talk to your care team about the use of this medication in children. Special care may be needed. Overdosage: If you think you have taken too much of this medicine contact a poison control center or emergency room at once. NOTE: This medicine is only for you. Do not share this medicine with others. What if I miss a dose? Keep appointments for follow-up doses. It is important not to miss your dose. Call your care team if you are unable to keep an appointment. What may interact with this medication? Ketoconazole Rifampin This list may not describe all possible interactions. Give your health care provider a list of all the medicines, herbs, non-prescription drugs, or dietary supplements you use. Also tell them if you smoke, drink alcohol, or use illegal drugs. Some items may interact with your medicine. What should I watch for while using this medication? Your condition will be monitored carefully while you are receiving this medication. You may need blood work while taking this medication. This medication may affect your coordination, reaction time, or judgment. Do not drive or operate machinery until you know how this medication affects you. Sit up or stand slowly to reduce the risk of dizzy or fainting spells. Drinking alcohol with this medication can increase the risk of these side effects. This medication may increase your risk of getting an infection. Call your care team for advice if you get a fever, chills, sore throat, or other symptoms of a cold or flu. Do not treat yourself. Try to avoid being around  people who are sick. Check with your care team if you have severe diarrhea, nausea, and vomiting, or if you sweat a lot. The loss of too much body fluid may make it dangerous for you to take this medication. Talk to your care team if you may be pregnant. Serious birth defects can occur if you take this medication during pregnancy and for 7 months after the last dose. You will need a negative pregnancy test before starting this medication. Contraception is recommended while taking this medication and for 7 months after the last dose. Your care team can help you find the option that works for you. If your partner can get pregnant, use a condom during sex while taking this medication and for 4 months after the last dose. Do not breastfeed while taking this medication and for 2 months after the last dose. This medication may cause infertility. Talk to your care team if you are concerned about your fertility. What side effects may I notice  from receiving this medication? Side effects that you should report to your care team as soon as possible: Allergic reactions--skin rash, itching, hives, swelling of the face, lips, tongue, or throat Bleeding--bloody or black, tar-like stools, vomiting blood or brown material that looks like coffee grounds, red or dark brown urine, small red or purple spots on skin, unusual bruising or bleeding Bleeding in the brain--severe headache, stiff neck, confusion, dizziness, change in vision, numbness or weakness of the face, arm, or leg, trouble speaking, trouble walking, vomiting Bowel blockage--stomach cramping, unable to have a bowel movement or pass gas, loss of appetite, vomiting Heart failure--shortness of breath, swelling of the ankles, feet, or hands, sudden weight gain, unusual weakness or fatigue Infection--fever, chills, cough, sore throat, wounds that don't heal, pain or trouble when passing urine, general feeling of discomfort or being unwell Liver injury--right  upper belly pain, loss of appetite, nausea, light-colored stool, dark yellow or brown urine, yellowing skin or eyes, unusual weakness or fatigue Low blood pressure--dizziness, feeling faint or lightheaded, blurry vision Lung injury--shortness of breath or trouble breathing, cough, spitting up blood, chest pain, fever Pain, tingling, or numbness in the hands or feet Severe or prolonged diarrhea Stomach pain, bloody diarrhea, pale skin, unusual weakness or fatigue, decrease in the amount of urine, which may be signs of hemolytic uremic syndrome Sudden and severe headache, confusion, change in vision, seizures, which may be signs of posterior reversible encephalopathy syndrome (PRES) TTP--purple spots on the skin or inside the mouth, pale skin, yellowing skin or eyes, unusual weakness or fatigue, fever, fast or irregular heartbeat, confusion, change in vision, trouble speaking, trouble walking Tumor lysis syndrome (TLS)--nausea, vomiting, diarrhea, decrease in the amount of urine, dark urine, unusual weakness or fatigue, confusion, muscle pain or cramps, fast or irregular heartbeat, joint pain Side effects that usually do not require medical attention (report to your care team if they continue or are bothersome): Constipation Diarrhea Fatigue Loss of appetite Nausea This list may not describe all possible side effects. Call your doctor for medical advice about side effects. You may report side effects to FDA at 1-800-FDA-1088. Where should I keep my medication? This medication is given in a hospital or clinic. It will not be stored at home. NOTE: This sheet is a summary. It may not cover all possible information. If you have questions about this medicine, talk to your doctor, pharmacist, or health care provider.  2024 Elsevier/Gold Standard (2021-06-28 00:00:00)  Zoledronic  Acid Injection (Cancer) What is this medication? ZOLEDRONIC  ACID (ZOE le dron ik AS id) treats high calcium levels in the  blood caused by cancer. It may also be used with chemotherapy to treat weakened bones caused by cancer. It works by slowing down the release of calcium from bones. This lowers calcium levels in your blood. It also makes your bones stronger and less likely to break (fracture). It belongs to a group of medications called bisphosphonates. This medicine may be used for other purposes; ask your health care provider or pharmacist if you have questions. COMMON BRAND NAME(S): Zometa , Zometa  Powder What should I tell my care team before I take this medication? They need to know if you have any of these conditions: Dehydration Dental disease Kidney disease Liver disease Low levels of calcium in the blood Lung or breathing disease, such as asthma Receiving steroids, such as dexamethasone  or prednisone An unusual or allergic reaction to zoledronic  acid, other medications, foods, dyes, or preservatives Pregnant or trying to get pregnant Breast-feeding How should  I use this medication? This medication is injected into a vein. It is given by your care team in a hospital or clinic setting. Talk to your care team about the use of this medication in children. Special care may be needed. Overdosage: If you think you have taken too much of this medicine contact a poison control center or emergency room at once. NOTE: This medicine is only for you. Do not share this medicine with others. What if I miss a dose? Keep appointments for follow-up doses. It is important not to miss your dose. Call your care team if you are unable to keep an appointment. What may interact with this medication? Certain antibiotics given by injection Diuretics, such as bumetanide, furosemide NSAIDs, medications for pain and inflammation, such as ibuprofen or naproxen  Teriparatide Thalidomide This list may not describe all possible interactions. Give your health care provider a list of all the medicines, herbs, non-prescription drugs,  or dietary supplements you use. Also tell them if you smoke, drink alcohol, or use illegal drugs. Some items may interact with your medicine. What should I watch for while using this medication? Visit your care team for regular checks on your progress. It may be some time before you see the benefit from this medication. Some people who take this medication have severe bone, joint, or muscle pain. This medication may also increase your risk for jaw problems or a broken thigh bone. Tell your care team right away if you have severe pain in your jaw, bones, joints, or muscles. Tell you care team if you have any pain that does not go away or that gets worse. Tell your dentist and dental surgeon that you are taking this medication. You should not have major dental surgery while on this medication. See your dentist to have a dental exam and fix any dental problems before starting this medication. Take good care of your teeth while on this medication. Make sure you see your dentist for regular follow-up appointments. You should make sure you get enough calcium and vitamin D  while you are taking this medication. Discuss the foods you eat and the vitamins you take with your care team. Check with your care team if you have severe diarrhea, nausea, and vomiting, or if you sweat a lot. The loss of too much body fluid may make it dangerous for you to take this medication. You may need bloodwork while taking this medication. Talk to your care team if you wish to become pregnant or think you might be pregnant. This medication can cause serious birth defects. What side effects may I notice from receiving this medication? Side effects that you should report to your care team as soon as possible: Allergic reactions--skin rash, itching, hives, swelling of the face, lips, tongue, or throat Kidney injury--decrease in the amount of urine, swelling of the ankles, hands, or feet Low calcium level--muscle pain or cramps,  confusion, tingling, or numbness in the hands or feet Osteonecrosis of the jaw--pain, swelling, or redness in the mouth, numbness of the jaw, poor healing after dental work, unusual discharge from the mouth, visible bones in the mouth Severe bone, joint, or muscle pain Side effects that usually do not require medical attention (report to your care team if they continue or are bothersome): Constipation Fatigue Fever Loss of appetite Nausea Stomach pain This list may not describe all possible side effects. Call your doctor for medical advice about side effects. You may report side effects to FDA at 1-800-FDA-1088. Where should  I keep my medication? This medication is given in a hospital or clinic. It will not be stored at home. NOTE: This sheet is a summary. It may not cover all possible information. If you have questions about this medicine, talk to your doctor, pharmacist, or health care provider.  2024 Elsevier/Gold Standard (2021-03-18 00:00:00)

## 2023-08-07 NOTE — Progress Notes (Signed)
 [facility] OFFICE PROGRESS NOTE  Patient Care Team: Rudolpho Norleen BIRCH, MD as PCP - General (Internal Medicine) Ivin Kocher, MD as Consulting Physician (Dermatology) Rennie Cindy SAUNDERS, MD as Consulting Physician (Hematology and Oncology)   Cancer Staging  Multiple myeloma in relapse [facility]) Staging form: Plasma Cell Myeloma and Plasma Cell Disorders, AJCC 8th Edition - Clinical: No stage assigned - Unsigned - Clinical: RISS Stage I (Beta-2 -microglobulin (mg/L): 2.4, Albumin (g/dL): 3.5, ISS: Stage I, High-risk cytogenetics: Absent, LDH: Normal) - Signed by Rennie Cindy SAUNDERS, MD on 06/19/2023 Beta 2 microglobulin range (mg/L): Less than 3.5 Albumin range (g/dL): Greater than or equal to 3.5 Cytogenetics: No abnormalities    Oncology History Overview Note   # April 2014- MULTIPLE MYELOMA [IgG- 2.2gm/dl; 59-49% plasma cell; Cyto-N; FISH- gain of chr.9, 15 & ccnd1/11q13] s/p RVD x6 [sep 2014; BMBx- ~5% plasma cells]; Maint Rev-DEX-Zometa ; March 2015-Stopped Dex Cont- Rev-Zometa ; July 2016- Rev 25mg ; NOV 4th  2016-HOLD; BMT eval at [facility] [Dr.Woods]; declined BMT.   # Re-START FEB 2017 Rev 15mg  3 W- on & 1 w OFF; HELD Rev June- Oct 2020- sec to worsening renal function [Dr.Singh]; SEP 2021- Rev 15 mg 2 w-On & 2 w-OFF. STOP REVLIMD in APRIL 2025-progression.    # APRIL 2025- . Diffuse lytic myelomatous lesions throughout the skeleton. Most of these lesions demonstrate hypermetabolism; Healing left mid clavicle fracture, likely pathologic; Early nondisplaced pathological fracture involving the right mid clavicle; Left pubic bone lesion has a defect in the outer cortex and this could be a pending pathologic fracture; . No findings for extraskeletal myeloma. Bone marrow Bx- on APRIL 25th, 2025- Pending.  # April 2025 discontinue lenalidomide .  # APRIL 29th, 2025- start Dara-Velcade  -Dex   # Zometa   # CKD [creat ~1.4; sec MM; Dr.Singh];  DM  -------------------------------------------------------------------------   DIAGNOSIS: [name] ] MULTIPLE MYELOMA  GOALS: control     Multiple myeloma in remission (HCC)  10/01/2015 Initial Diagnosis   Multiple myeloma in remission (HCC)   Multiple myeloma in relapse (HCC)  05/14/2023 Initial Diagnosis   Multiple myeloma in relapse (HCC)   06/05/2023 -  Chemotherapy   Patient is on Treatment Plan : MYELOMA RELAPSED / REFRACTORY Daratumumab  SQ + Bortezomib  + Dexamethasone  (DaraVd) q21d / Daratumumab  SQ q28d      06/05/2023 Cancer Staging   Staging form: Plasma Cell Myeloma and Plasma Cell Disorders, AJCC 8th Edition - Clinical: RISS Stage I (Beta-2 -microglobulin (mg/L): 2.4, Albumin (g/dL): 3.5, ISS: Stage I, High-risk cytogenetics: Absent, LDH: Normal) - Signed by Rennie Cindy SAUNDERS, MD on 06/19/2023 Beta 2 microglobulin range (mg/L): Less than 3.5 Albumin range (g/dL): Greater than or equal to 3.5 Cytogenetics: No abnormalities    INTERVAL HISTORY: pt is  with wife.  Ambulating independently.   Alan Alexander 78 y.o.  male pleasant patient above history of recurrent multiple myeloma; CKD stage III; DM on dara-VD  is to  proceed with chemotherapy.   Noted to have improvement of his hip pain. Patient currently taking  tramadol  1 pills  in AM and at bedtime prn.   Patient denies any skin rash. Patient also denies any tingling or numbness.  Mild swelling in the legs.    Review of Systems  Constitutional:  Negative for chills, diaphoresis, fever, malaise/fatigue and weight loss.  HENT:  Negative for nosebleeds and sore throat.   Eyes:  Negative for double vision.  Respiratory:  Negative for cough, hemoptysis, sputum production, shortness of breath and wheezing.   Cardiovascular:  Positive for leg swelling. Negative for chest pain, palpitations and orthopnea.  Gastrointestinal:  Negative for abdominal pain, blood in stool, constipation, diarrhea, heartburn, melena, nausea and  vomiting.  Genitourinary:  Negative for dysuria, frequency and urgency.  Musculoskeletal:  Positive for back pain and joint pain.  Skin: Negative.  Negative for itching and rash.  Neurological:  Negative for dizziness, tingling, focal weakness, weakness and headaches.  Endo/Heme/Allergies:  Does not bruise/bleed easily.  Psychiatric/Behavioral:  Negative for depression. The patient is not nervous/anxious and does not have insomnia.       PAST MEDICAL HISTORY :  Past Medical History:  Diagnosis Date   Diabetes mellitus without complication (HCC)    GERD (gastroesophageal reflux disease)    Hyperlipidemia    Hypertension    Multiple myeloma (HCC)    Obesity    Thyroid cancer (HCC)    Thyroid disease     PAST SURGICAL HISTORY :   Past Surgical History:  Procedure Laterality Date   BONE MARROW BIOPSY  2014   CATARACT EXTRACTION BILATERAL W/ ANTERIOR VITRECTOMY     IR BONE MARROW BIOPSY & ASPIRATION  06/01/2023    FAMILY HISTORY :   Family History  Problem Relation Age of Onset   Cancer Father 5   Diabetes Mother     SOCIAL HISTORY:   Social History   Tobacco Use   Smoking status: Never   Smokeless tobacco: Never  Substance Use Topics   Alcohol use: No   Drug use: No    ALLERGIES:  has no known allergies.  MEDICATIONS:  Current Outpatient Medications  Medication Sig Dispense Refill   acyclovir  (ZOVIRAX ) 400 MG tablet Take 1 tablet (400 mg total) by mouth 2 (two) times daily. 120 tablet 2   amLODipine  (NORVASC ) 10 MG tablet Take 10 mg by mouth daily.     aspirin EC 81 MG tablet Take 81 mg by mouth daily.     atorvastatin  (LIPITOR) 20 MG tablet Take 20 mg by mouth daily.     chlorproMAZINE  (THORAZINE ) 25 MG tablet Take 1 tablet (25 mg total) by mouth 3 (three) times daily. 60 tablet 1   Cyanocobalamin 1000 MCG TBCR Take by mouth.     cyclobenzaprine  (FLEXERIL ) 5 MG tablet Take 1 tablet (5 mg total) by mouth 3 (three) times daily as needed. 15 tablet 0    dapagliflozin propanediol (FARXIGA) 10 MG TABS tablet TAKE 10 MG BY MOUTH 1 (ONE) TIME EACH DAY IN THE MORNING     ergocalciferol  (VITAMIN D2) 1.25 MG (50000 UT) capsule Take 1 capsule (50,000 Units total) by mouth once a week. 12 capsule 1   glipiZIDE (GLUCOTROL) 5 MG tablet Take 5 mg by mouth every morning.     levothyroxine  (SYNTHROID ) 137 MCG tablet Take 1 tablet by mouth daily.      ondansetron  (ZOFRAN ) 8 MG tablet One pill every 8 hours as needed for nausea/vomitting. 40 tablet 1   ONETOUCH DELICA LANCETS 33G MISC Inject 1 Lancet as directed once daily.     potassium chloride  SA (K-DUR,KLOR-CON ) 20 MEQ tablet Take 20 mEq by mouth 2 (two) times daily.   0   prochlorperazine  (COMPAZINE ) 10 MG tablet Take 1 tablet (10 mg total) by mouth every 6 (six) hours as needed for nausea or vomiting. 40 tablet 1   sodium bicarbonate 650 MG tablet Take 650 mg by mouth 2 (two) times daily.     traMADol  (ULTRAM ) 50 MG tablet Take 1 tablet (50 mg total) by  mouth every 12 (twelve) hours as needed. 60 tablet 0   No current facility-administered medications for this visit.   Facility-Administered Medications Ordered in Other Visits  Medication Dose Route Frequency Provider Last Rate Last Admin   bortezomib  SQ (VELCADE ) chemo injection (2.5mg /mL concentration) 2.75 mg  1.3 mg/m2 (Treatment Plan Recorded) Subcutaneous Once Ebonee Stober R, MD       daratumumab -hyaluronidase -fihj (DARZALEX  FASPRO) 1800-30000 MG-UT/15ML chemo SQ injection 1,800 mg  1,800 mg Subcutaneous Once Darleny Sem R, MD       zoledronic  acid (ZOMETA ) 3 mg in sodium chloride  0.9 % 100 mL IVPB  3 mg Intravenous Once Lavra Imler R, MD        PHYSICAL EXAMINATION: ECOG PERFORMANCE STATUS: 0 - Asymptomatic  BP 130/86 (BP Location: Left Arm, Patient Position: Sitting, Cuff Size: Large)   Pulse 89   Temp (!) 96.7 F (35.9 C) (Tympanic)   Resp 16   Ht 5' 10 (1.778 m)   Wt 197 lb (89.4 kg)   SpO2 100%   BMI 28.27  kg/m   Filed Weights   08/07/23 0927  Weight: 197 lb (89.4 kg)       Physical Exam Vitals and nursing note reviewed.  HENT:     Head: Normocephalic and atraumatic.     Mouth/Throat:     Pharynx: Oropharynx is clear. No oropharyngeal exudate.   Eyes:     Extraocular Movements: Extraocular movements intact.     Pupils: Pupils are equal, round, and reactive to light.    Cardiovascular:     Rate and Rhythm: Normal rate and regular rhythm.  Pulmonary:     Effort: No respiratory distress.     Breath sounds: No wheezing.     Comments: Decreased breath sounds bilaterally.  Abdominal:     General: Bowel sounds are normal. There is no distension.     Palpations: Abdomen is soft. There is no mass.     Tenderness: There is no abdominal tenderness. There is no guarding or rebound.   Musculoskeletal:        General: No tenderness. Normal range of motion.     Cervical back: Normal range of motion and neck supple.   Skin:    General: Skin is warm.   Neurological:     General: No focal deficit present.     Mental Status: He is alert and oriented to person, place, and time.   Psychiatric:        Mood and Affect: Affect normal.        Behavior: Behavior normal.        Judgment: Judgment normal.       LABORATORY DATA:  I have reviewed the data as listed    Component Value Date/Time   NA 137 08/07/2023 0936   NA 139 05/01/2014 1322   K 4.5 08/07/2023 0936   K 4.4 05/01/2014 1322   CL 107 08/07/2023 0936   CL 107 05/01/2014 1322   CO2 22 08/07/2023 0936   CO2 27 05/01/2014 1322   GLUCOSE 146 (H) 08/07/2023 0936   GLUCOSE 102 (H) 05/01/2014 1322   BUN 15 08/07/2023 0936   BUN 16 05/01/2014 1322   CREATININE 1.23 08/07/2023 0936   CREATININE 1.48 (H) 05/29/2014 0949   CALCIUM 9.0 08/07/2023 0936   CALCIUM 9.1 05/01/2014 1322   PROT 6.2 (L) 08/07/2023 0936   PROT 7.5 05/01/2014 1322   ALBUMIN 3.8 08/07/2023 0936   ALBUMIN 3.6 05/01/2014 1322   AST 13 (L)  08/07/2023 0936   ALT 15 08/07/2023 0936   ALT 13 (L) 05/01/2014 1322   ALKPHOS 74 08/07/2023 0936   ALKPHOS 47 05/01/2014 1322   BILITOT 0.8 08/07/2023 0936   GFRNONAA 60 (L) 08/07/2023 0936   GFRNONAA 47 (L) 05/29/2014 0949   GFRAA 47 (L) 10/21/2019 1106   GFRAA 55 (L) 05/29/2014 0949    No results found for: SPEP, UPEP  Lab Results  Component Value Date   WBC 5.7 08/07/2023   NEUTROABS 4.4 08/07/2023   HGB 12.8 (L) 08/07/2023   HCT 39.1 08/07/2023   MCV 96.1 08/07/2023   PLT 313 08/07/2023      Chemistry      Component Value Date/Time   NA 137 08/07/2023 0936   NA 139 05/01/2014 1322   K 4.5 08/07/2023 0936   K 4.4 05/01/2014 1322   CL 107 08/07/2023 0936   CL 107 05/01/2014 1322   CO2 22 08/07/2023 0936   CO2 27 05/01/2014 1322   BUN 15 08/07/2023 0936   BUN 16 05/01/2014 1322   CREATININE 1.23 08/07/2023 0936   CREATININE 1.48 (H) 05/29/2014 0949      Component Value Date/Time   CALCIUM 9.0 08/07/2023 0936   CALCIUM 9.1 05/01/2014 1322   ALKPHOS 74 08/07/2023 0936   ALKPHOS 47 05/01/2014 1322   AST 13 (L) 08/07/2023 0936   ALT 15 08/07/2023 0936   ALT 13 (L) 05/01/2014 1322   BILITOT 0.8 08/07/2023 0936       RADIOGRAPHIC STUDIES: I have personally reviewed the radiological images as listed and agreed with the findings in the report. No results found.   ASSESSMENT & PLAN:  Multiple myeloma in relapse (HCC) #Multiple myeloma-PROGRESSED ON  maintenance Revlimid .  JAN 2025-  M-protein- 1.3[April 2023- NEG]; kappa=113 lambda light chain ratio=2.5 [July 2020- 2.0]. RISING- ca- 10.4. # APRIL 2025- . Diffuse lytic myelomatous lesions throughout the skeleton. Most of these lesions demonstrate hypermetabolism; Healing left mid clavicle fracture, likely pathologic; Early nondisplaced pathological fracture involving the right mid clavicle; Left pubic bone lesion has a defect in the outer cortex and this could be a pending pathologic fracture; . No findings for  extraskeletal myeloma. Bone marrow Bx- on APRIL 25th, 2025- Pending results.  April 2025 discontinue lenalidomide . On Dara Velcade  dexamethasone .    # proceed with Dara Velcade  dexamethasone .  cycle #3 day  today. Labs-CBC/chemistries were reviewed with the patient.  Patient interested in CAR-T therapy in mid-sep 2025. Patient reached out to [facility] - awaiting labs on july31st at [facility].  Continue current therapy for now.  # Diarrhea intermittent/hiccups/weight loss- improved- continue 20 mg of dexamethasone - stable.  # Renal: Hyperkalemia/hypokalemia- rCONTINUE  Kdur 2 pills  BID #  baseline creatinine 1.3-1.4; peak 1.8]. SABRA April 2024- Vit D 38- recommend Vit D 50,K/weekly-  CONTINUE TO HOLD QUINAPRIL -  stable-weight gain/leg swelling-recommend leg elevation compression stockings.   # Acute back pain: Progressive bone lesions on PET scan April 2025-status post Zometa  improved with tramadol  at bedtime-improved/  stable  # DM- on OHA -recommend close monitoring of blood glucose levels. Recommend bringing log at this time.  stable  # Hypothyroidism: on synthroid -  stable  # infection prophylaxis: Continue  acyclovir  twice a day. stable  # ACP [06/05/2023]:  full code.   For 1st 2 cycles- Velcade  twice weekly- Dara-Velcade  q Weekly x 4 cycles- starting # 5-8 dara q 3W- MD only on day-1; cycles 9+- dara q 28 days  valcade;  Zometa  q  6 M-last 08/07/2023-  PS;  Disposition: AM preference # q 4 week- MM panel; K/l light chains # chemo today;  chemo-  Zometa   # follow up in 1 week- MD: labs- cbc/cmp; chemo;   #  follow up in 3 week- MD: labs- cbc/cmp; chemo;  #  follow up in 4 week- MD: labs- cbc/cmp; -chemo- Dr.B     Orders Placed This Encounter  Procedures   Multiple Myeloma Panel (SPEP&IFE w/QIG)    Standing Status:   Future    Expected Date:   09/04/2023    Expiration Date:   08/06/2024   Kappa/lambda light chains    Standing Status:   Future    Expected Date:   09/04/2023     Expiration Date:   08/06/2024    All questions were answered. The patient knows to call the clinic with any problems, questions or concerns.      Cindy JONELLE Joe, MD 08/07/2023 10:40 AM

## 2023-08-14 ENCOUNTER — Inpatient Hospital Stay (HOSPITAL_BASED_OUTPATIENT_CLINIC_OR_DEPARTMENT_OTHER): Admitting: Internal Medicine

## 2023-08-14 ENCOUNTER — Encounter: Payer: Self-pay | Admitting: Internal Medicine

## 2023-08-14 ENCOUNTER — Inpatient Hospital Stay

## 2023-08-14 VITALS — BP 132/87 | HR 95 | Temp 96.3°F | Resp 20 | Ht 70.0 in | Wt 195.9 lb

## 2023-08-14 DIAGNOSIS — C9002 Multiple myeloma in relapse: Secondary | ICD-10-CM

## 2023-08-14 DIAGNOSIS — Z5111 Encounter for antineoplastic chemotherapy: Secondary | ICD-10-CM | POA: Diagnosis not present

## 2023-08-14 LAB — CBC WITH DIFFERENTIAL (CANCER CENTER ONLY)
Abs Immature Granulocytes: 0.02 K/uL (ref 0.00–0.07)
Basophils Absolute: 0 K/uL (ref 0.0–0.1)
Basophils Relative: 0 %
Eosinophils Absolute: 0.1 K/uL (ref 0.0–0.5)
Eosinophils Relative: 2 %
HCT: 38.5 % — ABNORMAL LOW (ref 39.0–52.0)
Hemoglobin: 13.1 g/dL (ref 13.0–17.0)
Immature Granulocytes: 0 %
Lymphocytes Relative: 9 %
Lymphs Abs: 0.4 K/uL — ABNORMAL LOW (ref 0.7–4.0)
MCH: 32.3 pg (ref 26.0–34.0)
MCHC: 34 g/dL (ref 30.0–36.0)
MCV: 94.8 fL (ref 80.0–100.0)
Monocytes Absolute: 0.6 K/uL (ref 0.1–1.0)
Monocytes Relative: 13 %
Neutro Abs: 3.6 K/uL (ref 1.7–7.7)
Neutrophils Relative %: 76 %
Platelet Count: 226 K/uL (ref 150–400)
RBC: 4.06 MIL/uL — ABNORMAL LOW (ref 4.22–5.81)
RDW: 17.5 % — ABNORMAL HIGH (ref 11.5–15.5)
WBC Count: 4.7 K/uL (ref 4.0–10.5)
nRBC: 0 % (ref 0.0–0.2)

## 2023-08-14 LAB — CMP (CANCER CENTER ONLY)
ALT: 16 U/L (ref 0–44)
AST: 14 U/L — ABNORMAL LOW (ref 15–41)
Albumin: 3.7 g/dL (ref 3.5–5.0)
Alkaline Phosphatase: 70 U/L (ref 38–126)
Anion gap: 8 (ref 5–15)
BUN: 16 mg/dL (ref 8–23)
CO2: 21 mmol/L — ABNORMAL LOW (ref 22–32)
Calcium: 8.8 mg/dL — ABNORMAL LOW (ref 8.9–10.3)
Chloride: 109 mmol/L (ref 98–111)
Creatinine: 1.23 mg/dL (ref 0.61–1.24)
GFR, Estimated: 60 mL/min — ABNORMAL LOW (ref >60)
Glucose, Bld: 133 mg/dL — ABNORMAL HIGH (ref 70–99)
Potassium: 4.7 mmol/L (ref 3.5–5.1)
Sodium: 138 mmol/L (ref 135–145)
Total Bilirubin: 1.1 mg/dL (ref 0.0–1.2)
Total Protein: 6.1 g/dL — ABNORMAL LOW (ref 6.5–8.1)

## 2023-08-14 MED ORDER — ONDANSETRON HCL 8 MG PO TABS: ORAL_TABLET | ORAL | 1 refills | Status: DC

## 2023-08-14 MED ORDER — BORTEZOMIB CHEMO SQ INJECTION 3.5 MG (2.5MG/ML)
1.3000 mg/m2 | Freq: Once | INTRAMUSCULAR | Status: AC
  Administered 2023-08-14: 2.75 mg via SUBCUTANEOUS
  Filled 2023-08-14: qty 1.1

## 2023-08-14 MED ORDER — DEXAMETHASONE 4 MG PO TABS
10.0000 mg | ORAL_TABLET | Freq: Once | ORAL | Status: AC
  Administered 2023-08-14: 10 mg via ORAL
  Filled 2023-08-14: qty 3

## 2023-08-14 NOTE — Progress Notes (Signed)
 Refill zofran , pended.

## 2023-08-14 NOTE — Patient Instructions (Signed)
 [name]  Discharge Instructions: Thank you for choosing [facility] to provide your oncology and hematology care.  If you have a lab appointment with the Cancer Center, please go directly to the Cancer Center and check in at the registration area.  Wear comfortable clothing and clothing appropriate for easy access to any Portacath or PICC line.   We strive to give you quality time with your provider. You may need to reschedule your appointment if you arrive late (15 or more minutes).  Arriving late affects you and other patients whose appointments are after yours.  Also, if you miss three or more appointments without notifying the office, you may be dismissed from the clinic at the provider's discretion.      For prescription refill requests, have your pharmacy contact our office and allow 72 hours for refills to be completed.    Today you received the following chemotherapy and/or immunotherapy agents- velcade      To help prevent nausea and vomiting after your treatment, we encourage you to take your nausea medication as directed.  BELOW ARE SYMPTOMS THAT SHOULD BE REPORTED IMMEDIATELY: *FEVER GREATER THAN 100.4 F (38 C) OR HIGHER *CHILLS OR SWEATING *NAUSEA AND VOMITING THAT IS NOT CONTROLLED WITH YOUR NAUSEA MEDICATION *UNUSUAL SHORTNESS OF BREATH *UNUSUAL BRUISING OR BLEEDING *URINARY PROBLEMS (pain or burning when urinating, or frequent urination) *BOWEL PROBLEMS (unusual diarrhea, constipation, pain near the anus) TENDERNESS IN MOUTH AND THROAT WITH OR WITHOUT PRESENCE OF ULCERS (sore throat, sores in mouth, or a toothache) UNUSUAL RASH, SWELLING OR PAIN  UNUSUAL VAGINAL DISCHARGE OR ITCHING   Items with * indicate a potential emergency and should be followed up as soon as possible or go to the Emergency Department if any problems should occur.  Please show the CHEMOTHERAPY ALERT CARD or IMMUNOTHERAPY  ALERT CARD at check-in to the Emergency Department and triage nurse.  Should you have questions after your visit or need to cancel or reschedule your appointment, please contact [name] - A DEPT OF [facility]  [phone] and follow the prompts.  Office hours are 8:00 a.m. to 4:30 p.m. Monday - Friday. Please note that voicemails left after 4:00 p.m. may not be returned until the following business day.  We are closed weekends and major holidays. You have access to a nurse at all times for urgent questions. Please call the main number to the clinic [phone] and follow the prompts.  For any non-urgent questions, you may also contact your provider using MyChart. We now offer e-Visits for anyone 47 and older to request care online for non-urgent symptoms. For details visit [email or web address].   Also download the MyChart app! Go to the app store, search "MyChart", open the app, select [place], and log in with your MyChart username and password.

## 2023-08-14 NOTE — Progress Notes (Signed)
 [facility] OFFICE PROGRESS NOTE  Patient Care Team: Alan Norleen BIRCH, MD as PCP - General (Internal Medicine) Alan Kocher, MD as Consulting Physician (Dermatology) Alan Cindy SAUNDERS, MD as Consulting Physician (Hematology and Oncology)   Cancer Staging  Multiple myeloma in relapse [facility]) Staging form: Plasma Cell Myeloma and Plasma Cell Disorders, AJCC 8th Edition - Clinical: No stage assigned - Unsigned - Clinical: RISS Stage I (Beta-2 -microglobulin (mg/L): 2.4, Albumin (g/dL): 3.5, ISS: Stage I, High-risk cytogenetics: Absent, LDH: Normal) - Signed by Alan Cindy SAUNDERS, MD on 06/19/2023 Beta 2 microglobulin range (mg/L): Less than 3.5 Albumin range (g/dL): Greater than or equal to 3.5 Cytogenetics: No abnormalities    Oncology History Overview Note   # April 2014- MULTIPLE MYELOMA [IgG- 2.2gm/dl; 59-49% plasma cell; Cyto-N; FISH- gain of chr.9, 15 & ccnd1/11q13] s/p RVD x6 [sep 2014; BMBx- ~5% plasma cells]; Maint Rev-DEX-Zometa ; March 2015-Stopped Dex Cont- Rev-Zometa ; July 2016- Rev 25mg ; NOV 4th  2016-HOLD; BMT eval at [facility] [Dr.Woods]; declined BMT.   # Re-START FEB 2017 Rev 15mg  3 W- on & 1 w OFF; HELD Rev June- Oct 2020- sec to worsening renal function [Dr.Singh]; SEP 2021- Rev 15 mg 2 w-On & 2 w-OFF. STOP REVLIMD in APRIL 2025-progression.    # APRIL 2025- . Diffuse lytic myelomatous lesions throughout the skeleton. Most of these lesions demonstrate hypermetabolism; Healing left mid clavicle fracture, likely pathologic; Early nondisplaced pathological fracture involving the right mid clavicle; Left pubic bone lesion has a defect in the outer cortex and this could be a pending pathologic fracture; . No findings for extraskeletal myeloma. Bone marrow Bx- on APRIL 25th, 2025- Pending.  # April 2025 discontinue lenalidomide .  # APRIL 29th, 2025- start Dara-Velcade  -Dex   # Zometa   # CKD [creat ~1.4; sec MM; Dr.Singh];  DM  -------------------------------------------------------------------------   DIAGNOSIS: [name] ] MULTIPLE MYELOMA  GOALS: control     Multiple myeloma in remission (HCC)  10/01/2015 Initial Diagnosis   Multiple myeloma in remission (HCC)   Multiple myeloma in relapse (HCC)  05/14/2023 Initial Diagnosis   Multiple myeloma in relapse (HCC)   06/05/2023 -  Chemotherapy   Patient is on Treatment Plan : MYELOMA RELAPSED / REFRACTORY Daratumumab  SQ + Bortezomib  + Dexamethasone  (DaraVd) q21d / Daratumumab  SQ q28d      06/05/2023 Cancer Staging   Staging form: Plasma Cell Myeloma and Plasma Cell Disorders, AJCC 8th Edition - Clinical: RISS Stage I (Beta-2 -microglobulin (mg/L): 2.4, Albumin (g/dL): 3.5, ISS: Stage I, High-risk cytogenetics: Absent, LDH: Normal) - Signed by Alan Cindy SAUNDERS, MD on 06/19/2023 Beta 2 microglobulin range (mg/L): Less than 3.5 Albumin range (g/dL): Greater than or equal to 3.5 Cytogenetics: No abnormalities    INTERVAL HISTORY: pt is  with wife.  Ambulating independently.   Alan Alexander 78 y.o.  male pleasant patient above history of recurrent multiple myeloma; CKD stage III; DM on dara-VD  is to  proceed with chemotherapy.   Noted to have improvement of his hip pain. Patient currently taking  tramadol  1 pills  in AM and at bedtime prn.   Patient denies any skin rash. Patient also denies any tingling or numbness. Mild swelling in the legs.    Review of Systems  Constitutional:  Negative for chills, diaphoresis, fever, malaise/fatigue and weight loss.  HENT:  Negative for nosebleeds and sore throat.   Eyes:  Negative for double vision.  Respiratory:  Negative for cough, hemoptysis, sputum production, shortness of breath and wheezing.   Cardiovascular:  Positive  for leg swelling. Negative for chest pain, palpitations and orthopnea.  Gastrointestinal:  Negative for abdominal pain, blood in stool, constipation, diarrhea, heartburn, melena, nausea and vomiting.   Genitourinary:  Negative for dysuria, frequency and urgency.  Musculoskeletal:  Positive for back pain and joint pain.  Skin: Negative.  Negative for itching and rash.  Neurological:  Negative for dizziness, tingling, focal weakness, weakness and headaches.  Endo/Heme/Allergies:  Does not bruise/bleed easily.  Psychiatric/Behavioral:  Negative for depression. The patient is not nervous/anxious and does not have insomnia.       PAST MEDICAL HISTORY :  Past Medical History:  Diagnosis Date   Diabetes mellitus without complication (HCC)    GERD (gastroesophageal reflux disease)    Hyperlipidemia    Hypertension    Multiple myeloma (HCC)    Obesity    Thyroid cancer (HCC)    Thyroid disease     PAST SURGICAL HISTORY :   Past Surgical History:  Procedure Laterality Date   BONE MARROW BIOPSY  2014   CATARACT EXTRACTION BILATERAL W/ ANTERIOR VITRECTOMY     IR BONE MARROW BIOPSY & ASPIRATION  06/01/2023    FAMILY HISTORY :   Family History  Problem Relation Age of Onset   Cancer Father 81   Diabetes Mother     SOCIAL HISTORY:   Social History   Tobacco Use   Smoking status: Never   Smokeless tobacco: Never  Substance Use Topics   Alcohol use: No   Drug use: No    ALLERGIES:  has no known allergies.  MEDICATIONS:  Current Outpatient Medications  Medication Sig Dispense Refill   acyclovir  (ZOVIRAX ) 400 MG tablet Take 1 tablet (400 mg total) by mouth 2 (two) times daily. 120 tablet 2   amLODipine  (NORVASC ) 10 MG tablet Take 10 mg by mouth daily.     aspirin EC 81 MG tablet Take 81 mg by mouth daily.     atorvastatin  (LIPITOR) 20 MG tablet Take 20 mg by mouth daily.     chlorproMAZINE  (THORAZINE ) 25 MG tablet Take 1 tablet (25 mg total) by mouth 3 (three) times daily. 60 tablet 1   Cyanocobalamin 1000 MCG TBCR Take by mouth.     cyclobenzaprine  (FLEXERIL ) 5 MG tablet Take 1 tablet (5 mg total) by mouth 3 (three) times daily as needed. 15 tablet 0   dapagliflozin  propanediol (FARXIGA) 10 MG TABS tablet TAKE 10 MG BY MOUTH 1 (ONE) TIME EACH DAY IN THE MORNING     ergocalciferol  (VITAMIN D2) 1.25 MG (50000 UT) capsule Take 1 capsule (50,000 Units total) by mouth once a week. 12 capsule 1   glipiZIDE (GLUCOTROL) 5 MG tablet Take 5 mg by mouth every morning.     levothyroxine  (SYNTHROID ) 137 MCG tablet Take 1 tablet by mouth daily.      ONETOUCH DELICA LANCETS 33G MISC Inject 1 Lancet as directed once daily.     potassium chloride  SA (K-DUR,KLOR-CON ) 20 MEQ tablet Take 20 mEq by mouth 2 (two) times daily.   0   prochlorperazine  (COMPAZINE ) 10 MG tablet Take 1 tablet (10 mg total) by mouth every 6 (six) hours as needed for nausea or vomiting. 40 tablet 1   sodium bicarbonate 650 MG tablet Take 650 mg by mouth 2 (two) times daily.     traMADol  (ULTRAM ) 50 MG tablet Take 1 tablet (50 mg total) by mouth every 12 (twelve) hours as needed. 60 tablet 0   ondansetron  (ZOFRAN ) 8 MG tablet One pill every  8 hours as needed for nausea/vomitting. 60 tablet 1   No current facility-administered medications for this visit.   Facility-Administered Medications Ordered in Other Visits  Medication Dose Route Frequency Provider Last Rate Last Admin   bortezomib  SQ (VELCADE ) chemo injection (2.5mg /mL concentration) 2.75 mg  1.3 mg/m2 (Treatment Plan Recorded) Subcutaneous Once Meryl Hubers R, MD        PHYSICAL EXAMINATION: ECOG PERFORMANCE STATUS: 0 - Asymptomatic  BP 132/87 (BP Location: Right Arm, Patient Position: Sitting, Cuff Size: Large)   Pulse 95   Temp (!) 96.3 F (35.7 C) (Tympanic)   Resp 20   Ht 5' 10 (1.778 m)   Wt 195 lb 14.4 oz (88.9 kg)   SpO2 100%   BMI 28.11 kg/m   Filed Weights   08/14/23 0855  Weight: 195 lb 14.4 oz (88.9 kg)        Physical Exam Vitals and nursing note reviewed.  HENT:     Head: Normocephalic and atraumatic.     Mouth/Throat:     Pharynx: Oropharynx is clear. No oropharyngeal exudate.  Eyes:     Extraocular  Movements: Extraocular movements intact.     Pupils: Pupils are equal, round, and reactive to light.  Cardiovascular:     Rate and Rhythm: Normal rate and regular rhythm.  Pulmonary:     Effort: No respiratory distress.     Breath sounds: No wheezing.     Comments: Decreased breath sounds bilaterally.  Abdominal:     General: Bowel sounds are normal. There is no distension.     Palpations: Abdomen is soft. There is no mass.     Tenderness: There is no abdominal tenderness. There is no guarding or rebound.  Musculoskeletal:        General: No tenderness. Normal range of motion.     Cervical back: Normal range of motion and neck supple.  Skin:    General: Skin is warm.  Neurological:     General: No focal deficit present.     Mental Status: He is alert and oriented to person, place, and time.  Psychiatric:        Mood and Affect: Affect normal.        Behavior: Behavior normal.        Judgment: Judgment normal.       LABORATORY DATA:  I have reviewed the data as listed    Component Value Date/Time   NA 138 08/14/2023 0844   NA 139 05/01/2014 1322   K 4.7 08/14/2023 0844   K 4.4 05/01/2014 1322   CL 109 08/14/2023 0844   CL 107 05/01/2014 1322   CO2 21 (L) 08/14/2023 0844   CO2 27 05/01/2014 1322   GLUCOSE 133 (H) 08/14/2023 0844   GLUCOSE 102 (H) 05/01/2014 1322   BUN 16 08/14/2023 0844   BUN 16 05/01/2014 1322   CREATININE 1.23 08/14/2023 0844   CREATININE 1.48 (H) 05/29/2014 0949   CALCIUM 8.8 (L) 08/14/2023 0844   CALCIUM 9.1 05/01/2014 1322   PROT 6.1 (L) 08/14/2023 0844   PROT 7.5 05/01/2014 1322   ALBUMIN 3.7 08/14/2023 0844   ALBUMIN 3.6 05/01/2014 1322   AST 14 (L) 08/14/2023 0844   ALT 16 08/14/2023 0844   ALT 13 (L) 05/01/2014 1322   ALKPHOS 70 08/14/2023 0844   ALKPHOS 47 05/01/2014 1322   BILITOT 1.1 08/14/2023 0844   GFRNONAA 60 (L) 08/14/2023 0844   GFRNONAA 47 (L) 05/29/2014 0949   GFRAA 47 (L) 10/21/2019 1106  GFRAA 55 (L) 05/29/2014 0949     No results found for: SPEP, UPEP  Lab Results  Component Value Date   WBC 4.7 08/14/2023   NEUTROABS 3.6 08/14/2023   HGB 13.1 08/14/2023   HCT 38.5 (L) 08/14/2023   MCV 94.8 08/14/2023   PLT 226 08/14/2023      Chemistry      Component Value Date/Time   NA 138 08/14/2023 0844   NA 139 05/01/2014 1322   K 4.7 08/14/2023 0844   K 4.4 05/01/2014 1322   CL 109 08/14/2023 0844   CL 107 05/01/2014 1322   CO2 21 (L) 08/14/2023 0844   CO2 27 05/01/2014 1322   BUN 16 08/14/2023 0844   BUN 16 05/01/2014 1322   CREATININE 1.23 08/14/2023 0844   CREATININE 1.48 (H) 05/29/2014 0949      Component Value Date/Time   CALCIUM 8.8 (L) 08/14/2023 0844   CALCIUM 9.1 05/01/2014 1322   ALKPHOS 70 08/14/2023 0844   ALKPHOS 47 05/01/2014 1322   AST 14 (L) 08/14/2023 0844   ALT 16 08/14/2023 0844   ALT 13 (L) 05/01/2014 1322   BILITOT 1.1 08/14/2023 0844       RADIOGRAPHIC STUDIES: I have personally reviewed the radiological images as listed and agreed with the findings in the report. No results found.   ASSESSMENT & PLAN:  Multiple myeloma in relapse (HCC) #Multiple myeloma-PROGRESSED ON  maintenance Revlimid .  JAN 2025-  M-protein- 1.3[April 2023- NEG]; kappa=113 lambda light chain ratio=2.5 [July 2020- 2.0]. RISING- ca- 10.4. # APRIL 2025- . Diffuse lytic myelomatous lesions throughout the skeleton. Most of these lesions demonstrate hypermetabolism; Healing left mid clavicle fracture, likely pathologic; Early nondisplaced pathological fracture involving the right mid clavicle; Left pubic bone lesion has a defect in the outer cortex and this could be a pending pathologic fracture; . No findings for extraskeletal myeloma. Bone marrow Bx- on APRIL 25th, 2025- Pending results.  April 2025 discontinue lenalidomide . On Dara Velcade  dexamethasone .    # proceed with Dara Velcade  dexamethasone .  cycle #3 day  today. Labs-CBC/chemistries were reviewed with the patient.  Patient interested  in CAR-T therapy in mid-sep 2025. Patient reached out to [facility] - awaiting labs on july31st at [facility].  Continue current therapy for now.  # Diarrhea intermittent/hiccups/weight loss- improved- continue 20 mg of dexamethasone - stable.  # Renal: Hyperkalemia/hypokalemia- rCONTINUE  Kdur 2 pills  BID #  baseline creatinine 1.3-1.4; peak 1.8]. SABRA April 2024- Vit D 38- recommend Vit D 50,K/weekly-  CONTINUE TO HOLD QUINAPRIL -  stable-weight gain/leg swelling-recommend leg elevation compression stockings- stable.  # Acute back pain: Progressive bone lesions on PET scan April 2025-status post Zometa  improved with tramadol  at bedtime-stable.  # DM- on OHA -recommend close monitoring of blood glucose levels. Recommend bringing log at this time.  stable.  # Hypothyroidism: on synthroid -  stable.  # infection prophylaxis: Continue  acyclovir  twice a day. stable.  # ACP [06/05/2023]:  full code.   For 1st 2 cycles- Velcade  twice weekly- Dara-Velcade  q Weekly x 4 cycles- starting # 5-8 dara q 3W- MD only on day-1; cycles 9+- dara q 28 days  valcade;  Zometa  q 6 M-last 08/07/2023-  PS;  Disposition: AM preference # q 4 week- MM panel; K/l light chains # chemo today;  chemo # follow up in 2 week- MD: labs- cbc/cmp; chemo;   #  follow up in 3 week-  labs- cbc/cmp; chemo;   #  follow up in 4 week-  MD: labs- cbc/cmp; -chemo # follow up in 5  week-   labs- cbc/cmp; -chemo- - Dr.B     Orders Placed This Encounter  Procedures   CBC with Differential (Cancer Center Only)    Standing Status:   Future    Expected Date:   09/18/2023    Expiration Date:   09/17/2024   CMP (Cancer Center only)    Standing Status:   Future    Expected Date:   09/18/2023    Expiration Date:   09/17/2024   CBC with Differential (Cancer Center Only)    Standing Status:   Future    Expected Date:   09/25/2023    Expiration Date:   09/24/2024   CMP (Cancer Center only)    Standing Status:   Future    Expected Date:    09/25/2023    Expiration Date:   09/24/2024   Multiple Myeloma Panel (SPEP&IFE w/QIG)    Standing Status:   Standing    Number of Occurrences:   10    Expiration Date:   08/13/2024   Kappa/lambda light chains    Standing Status:   Standing    Number of Occurrences:   10    Expiration Date:   08/13/2024    All questions were answered. The patient knows to call the clinic with any problems, questions or concerns.      Cindy JONELLE Joe, MD 08/14/2023 9:51 AM

## 2023-08-14 NOTE — Assessment & Plan Note (Addendum)
#  Multiple myeloma-PROGRESSED ON  maintenance Revlimid .  JAN 2025-  M-protein- 1.3[April 2023- NEG]; kappa=113 lambda light chain ratio=2.5 [July 2020- 2.0]. RISING- ca- 10.4. # APRIL 2025- . Diffuse lytic myelomatous lesions throughout the skeleton. Most of these lesions demonstrate hypermetabolism; Healing left mid clavicle fracture, likely pathologic; Early nondisplaced pathological fracture involving the right mid clavicle; Left pubic bone lesion has a defect in the outer cortex and this could be a pending pathologic fracture; . No findings for extraskeletal myeloma. Bone marrow Bx- on APRIL 25th, 2025- Pending results.  April 2025 discontinue lenalidomide . On Dara Velcade  dexamethasone .    # proceed with Dara Velcade  dexamethasone .  cycle #3 day  today. Labs-CBC/chemistries were reviewed with the patient.  Patient interested in CAR-T therapy in mid-sep 2025. Patient reached out to [facility] - awaiting labs on july31st at [facility].  Continue current therapy for now.  # Diarrhea intermittent/hiccups/weight loss- improved- continue 20 mg of dexamethasone - stable.  # Renal: Hyperkalemia/hypokalemia- rCONTINUE  Kdur 2 pills  BID #  baseline creatinine 1.3-1.4; peak 1.8]. SABRA April 2024- Vit D 38- recommend Vit D 50,K/weekly-  CONTINUE TO HOLD QUINAPRIL -  stable-weight gain/leg swelling-recommend leg elevation compression stockings- stable.  # Acute back pain: Progressive bone lesions on PET scan April 2025-status post Zometa  improved with tramadol  at bedtime-stable.  # DM- on OHA -recommend close monitoring of blood glucose levels. Recommend bringing log at this time.  stable.  # Hypothyroidism: on synthroid -  stable.  # infection prophylaxis: Continue  acyclovir  twice a day. stable.  # ACP [06/05/2023]:  full code.   For 1st 2 cycles- Velcade  twice weekly- Dara-Velcade  q Weekly x 4 cycles- starting # 5-8 dara q 3W- MD only on day-1; cycles 9+- dara q 28 days  valcade;  Zometa  q 6 M-last 08/07/2023-  PS;   Disposition: AM preference # q 4 week- MM panel; K/l light chains # chemo today;  chemo # follow up in 2 week- MD: labs- cbc/cmp; chemo;   #  follow up in 3 week-  labs- cbc/cmp; chemo;   #  follow up in 4 week- MD: labs- cbc/cmp; -chemo # follow up in 5  week-   labs- cbc/cmp; -chemo- - Dr.B

## 2023-08-15 ENCOUNTER — Other Ambulatory Visit: Payer: Self-pay

## 2023-08-26 ENCOUNTER — Other Ambulatory Visit: Payer: Self-pay | Admitting: Internal Medicine

## 2023-08-26 MED ORDER — TRAMADOL HCL 50 MG PO TABS: 50.0000 mg | ORAL_TABLET | Freq: Two times a day (BID) | ORAL | 0 refills | Status: DC | PRN

## 2023-08-26 NOTE — Progress Notes (Signed)
 Called in a script for tramadol -

## 2023-08-28 ENCOUNTER — Inpatient Hospital Stay

## 2023-08-28 ENCOUNTER — Other Ambulatory Visit: Payer: Self-pay

## 2023-08-28 ENCOUNTER — Encounter: Payer: Self-pay | Admitting: Internal Medicine

## 2023-08-28 ENCOUNTER — Inpatient Hospital Stay (HOSPITAL_BASED_OUTPATIENT_CLINIC_OR_DEPARTMENT_OTHER): Admitting: Internal Medicine

## 2023-08-28 VITALS — BP 129/85 | HR 94 | Temp 96.0°F | Resp 18 | Ht 70.0 in | Wt 193.3 lb

## 2023-08-28 DIAGNOSIS — C9002 Multiple myeloma in relapse: Secondary | ICD-10-CM

## 2023-08-28 DIAGNOSIS — Z5111 Encounter for antineoplastic chemotherapy: Secondary | ICD-10-CM | POA: Diagnosis not present

## 2023-08-28 LAB — CMP (CANCER CENTER ONLY)
ALT: 13 U/L (ref 0–44)
AST: 14 U/L — ABNORMAL LOW (ref 15–41)
Albumin: 3.7 g/dL (ref 3.5–5.0)
Alkaline Phosphatase: 74 U/L (ref 38–126)
Anion gap: 7 (ref 5–15)
BUN: 19 mg/dL (ref 8–23)
CO2: 23 mmol/L (ref 22–32)
Calcium: 8.5 mg/dL — ABNORMAL LOW (ref 8.9–10.3)
Chloride: 109 mmol/L (ref 98–111)
Creatinine: 1.1 mg/dL (ref 0.61–1.24)
GFR, Estimated: >60 mL/min (ref >60)
Glucose, Bld: 206 mg/dL — ABNORMAL HIGH (ref 70–99)
Potassium: 4.1 mmol/L (ref 3.5–5.1)
Sodium: 139 mmol/L (ref 135–145)
Total Bilirubin: 0.8 mg/dL (ref 0.0–1.2)
Total Protein: 6.3 g/dL — ABNORMAL LOW (ref 6.5–8.1)

## 2023-08-28 LAB — CBC WITH DIFFERENTIAL (CANCER CENTER ONLY)
Abs Immature Granulocytes: 0.01 K/uL (ref 0.00–0.07)
Basophils Absolute: 0 K/uL (ref 0.0–0.1)
Basophils Relative: 0 %
Eosinophils Absolute: 0.1 K/uL (ref 0.0–0.5)
Eosinophils Relative: 1 %
HCT: 38.3 % — ABNORMAL LOW (ref 39.0–52.0)
Hemoglobin: 13 g/dL (ref 13.0–17.0)
Immature Granulocytes: 0 %
Lymphocytes Relative: 7 %
Lymphs Abs: 0.5 K/uL — ABNORMAL LOW (ref 0.7–4.0)
MCH: 32.7 pg (ref 26.0–34.0)
MCHC: 33.9 g/dL (ref 30.0–36.0)
MCV: 96.5 fL (ref 80.0–100.0)
Monocytes Absolute: 0.8 K/uL (ref 0.1–1.0)
Monocytes Relative: 12 %
Neutro Abs: 4.9 K/uL (ref 1.7–7.7)
Neutrophils Relative %: 80 %
Platelet Count: 331 K/uL (ref 150–400)
RBC: 3.97 MIL/uL — ABNORMAL LOW (ref 4.22–5.81)
RDW: 15.9 % — ABNORMAL HIGH (ref 11.5–15.5)
WBC Count: 6.2 K/uL (ref 4.0–10.5)
nRBC: 0 % (ref 0.0–0.2)

## 2023-08-28 MED ORDER — ACETAMINOPHEN 325 MG PO TABS
650.0000 mg | ORAL_TABLET | Freq: Once | ORAL | Status: AC
Start: 2023-08-28 — End: 2023-08-28
  Administered 2023-08-28: 650 mg via ORAL
  Filled 2023-08-28: qty 2

## 2023-08-28 MED ORDER — DEXAMETHASONE 4 MG PO TABS
20.0000 mg | ORAL_TABLET | Freq: Once | ORAL | Status: AC
  Administered 2023-08-28: 20 mg via ORAL
  Filled 2023-08-28: qty 5

## 2023-08-28 MED ORDER — DIPHENHYDRAMINE HCL 25 MG PO CAPS
50.0000 mg | ORAL_CAPSULE | Freq: Once | ORAL | Status: AC
  Administered 2023-08-28: 50 mg via ORAL
  Filled 2023-08-28: qty 2

## 2023-08-28 MED ORDER — BORTEZOMIB CHEMO SQ INJECTION 3.5 MG (2.5MG/ML)
1.3000 mg/m2 | Freq: Once | INTRAMUSCULAR | Status: AC
  Administered 2023-08-28: 2.75 mg via SUBCUTANEOUS
  Filled 2023-08-28: qty 1.1

## 2023-08-28 MED ORDER — DARATUMUMAB-HYALURONIDASE-FIHJ 1800-30000 MG-UT/15ML ~~LOC~~ SOLN
1800.0000 mg | Freq: Once | SUBCUTANEOUS | Status: AC
  Administered 2023-08-28: 1800 mg via SUBCUTANEOUS
  Filled 2023-08-28: qty 15

## 2023-08-28 NOTE — Patient Instructions (Signed)
 [name] - A DEPT OF [facility]  Discharge Instructions: Thank you for choosing [facility] to provide your oncology and hematology care.  If you have a lab appointment with the Cancer Center, please go directly to the Cancer Center and check in at the registration area.  Wear comfortable clothing and clothing appropriate for easy access to any Portacath or PICC line.   We strive to give you quality time with your provider. You may need to reschedule your appointment if you arrive late (15 or more minutes).  Arriving late affects you and other patients whose appointments are after yours.  Also, if you miss three or more appointments without notifying the office, you may be dismissed from the clinic at the provider's discretion.      For prescription refill requests, have your pharmacy contact our office and allow 72 hours for refills to be completed.    Today you received the following chemotherapy and/or immunotherapy agents Darzalex  and Velcade .      To help prevent nausea and vomiting after your treatment, we encourage you to take your nausea medication as directed.  BELOW ARE SYMPTOMS THAT SHOULD BE REPORTED IMMEDIATELY: *FEVER GREATER THAN 100.4 F (38 C) OR HIGHER *CHILLS OR SWEATING *NAUSEA AND VOMITING THAT IS NOT CONTROLLED WITH YOUR NAUSEA MEDICATION *UNUSUAL SHORTNESS OF BREATH *UNUSUAL BRUISING OR BLEEDING *URINARY PROBLEMS (pain or burning when urinating, or frequent urination) *BOWEL PROBLEMS (unusual diarrhea, constipation, pain near the anus) TENDERNESS IN MOUTH AND THROAT WITH OR WITHOUT PRESENCE OF ULCERS (sore throat, sores in mouth, or a toothache) UNUSUAL RASH, SWELLING OR PAIN  UNUSUAL VAGINAL DISCHARGE OR ITCHING   Items with * indicate a potential emergency and should be followed up as soon as possible or go to the Emergency Department if any problems should occur.  Please show the CHEMOTHERAPY ALERT CARD or  IMMUNOTHERAPY ALERT CARD at check-in to the Emergency Department and triage nurse.  Should you have questions after your visit or need to cancel or reschedule your appointment, please contact [name] - A DEPT OF [facility]  [phone] and follow the prompts.  Office hours are 8:00 a.m. to 4:30 p.m. Monday - Friday. Please note that voicemails left after 4:00 p.m. may not be returned until the following business day.  We are closed weekends and major holidays. You have access to a nurse at all times for urgent questions. Please call the main number to the clinic [phone] and follow the prompts.  For any non-urgent questions, you may also contact your provider using MyChart. We now offer e-Visits for anyone 60 and older to request care online for non-urgent symptoms. For details visit [email or web address].   Also download the MyChart app! Go to the app store, search "MyChart", open the app, select [facility], and log in with your MyChart username and password.

## 2023-08-28 NOTE — Progress Notes (Signed)
 [facility] OFFICE PROGRESS NOTE  Patient Care Team: Rudolpho Norleen BIRCH, MD as PCP - General (Internal Medicine) Ivin Kocher, MD as Consulting Physician (Dermatology) Rennie Alan SAUNDERS, MD as Consulting Physician (Oncology)   Cancer Staging  Multiple myeloma in relapse [facility]) Staging form: Plasma Cell Myeloma and Plasma Cell Disorders, AJCC 8th Edition - Clinical: No stage assigned - Unsigned - Clinical: RISS Stage I (Beta-2 -microglobulin (mg/L): 2.4, Albumin (g/dL): 3.5, ISS: Stage I, High-risk cytogenetics: Absent, LDH: Normal) - Signed by Rennie Alan SAUNDERS, MD on 06/19/2023 Beta 2 microglobulin range (mg/L): Less than 3.5 Albumin range (g/dL): Greater than or equal to 3.5 Cytogenetics: No abnormalities    Oncology History Overview Note   # April 2014- MULTIPLE MYELOMA [IgG- 2.2gm/dl; 59-49% plasma cell; Cyto-N; FISH- gain of chr.9, 15 & ccnd1/11q13] s/p RVD x6 [sep 2014; BMBx- ~5% plasma cells]; Maint Rev-DEX-Zometa ; March 2015-Stopped Dex Cont- Rev-Zometa ; July 2016- Rev 25mg ; NOV 4th  2016-HOLD; BMT eval at [facility] [Dr.Woods]; declined BMT.   # Re-START FEB 2017 Rev 15mg  3 W- on & 1 w OFF; HELD Rev June- Oct 2020- sec to worsening renal function [Dr.Singh]; SEP 2021- Rev 15 mg 2 w-On & 2 w-OFF. STOP REVLIMD in APRIL 2025-progression.    # APRIL 2025- . Diffuse lytic myelomatous lesions throughout the skeleton. Most of these lesions demonstrate hypermetabolism; Healing left mid clavicle fracture, likely pathologic; Early nondisplaced pathological fracture involving the right mid clavicle; Left pubic bone lesion has a defect in the outer cortex and this could be a pending pathologic fracture; . No findings for extraskeletal myeloma. Bone marrow Bx- on APRIL 25th, 2025- Pending.  # April 2025 discontinue lenalidomide .  # APRIL 29th, 2025- start Dara-Velcade  -Dex   # Zometa   # CKD [creat ~1.4; sec MM; Dr.Singh];  DM  -------------------------------------------------------------------------   DIAGNOSIS: [name] ] MULTIPLE MYELOMA  GOALS: control     Multiple myeloma in remission (HCC)  10/01/2015 Initial Diagnosis   Multiple myeloma in remission (HCC)   Multiple myeloma in relapse (HCC)  05/14/2023 Initial Diagnosis   Multiple myeloma in relapse (HCC)   06/05/2023 -  Chemotherapy   Patient is on Treatment Plan : MYELOMA RELAPSED / REFRACTORY Daratumumab  SQ + Bortezomib  + Dexamethasone  (DaraVd) q21d / Daratumumab  SQ q28d      06/05/2023 Cancer Staging   Staging form: Plasma Cell Myeloma and Plasma Cell Disorders, AJCC 8th Edition - Clinical: RISS Stage I (Beta-2 -microglobulin (mg/L): 2.4, Albumin (g/dL): 3.5, ISS: Stage I, High-risk cytogenetics: Absent, LDH: Normal) - Signed by Rennie Alan SAUNDERS, MD on 06/19/2023 Beta 2 microglobulin range (mg/L): Less than 3.5 Albumin range (g/dL): Greater than or equal to 3.5 Cytogenetics: No abnormalities    INTERVAL HISTORY: Alone today. Ambulating independently.   Alan Alexander 78 y.o.  male pleasant patient above history of recurrent multiple myeloma; CKD stage III; DM on dara-VD  is to  proceed with chemotherapy.   Noted to have improvement of his hip pain. Patient currently taking  tramadol  1 pills  in AM and at bedtime prn.   Patient denies any skin rash. Patient also denies any tingling or numbness. Mild swelling in the legs.    Review of Systems  Constitutional:  Negative for chills, diaphoresis, fever, malaise/fatigue and weight loss.  HENT:  Negative for nosebleeds and sore throat.   Eyes:  Negative for double vision.  Respiratory:  Negative for cough, hemoptysis, sputum production, shortness of breath and wheezing.   Cardiovascular:  Positive for leg swelling. Negative for chest  pain, palpitations and orthopnea.  Gastrointestinal:  Negative for abdominal pain, blood in stool, constipation, diarrhea, heartburn, melena, nausea and vomiting.   Genitourinary:  Negative for dysuria, frequency and urgency.  Musculoskeletal:  Positive for back pain and joint pain.  Skin: Negative.  Negative for itching and rash.  Neurological:  Negative for dizziness, tingling, focal weakness, weakness and headaches.  Endo/Heme/Allergies:  Does not bruise/bleed easily.  Psychiatric/Behavioral:  Negative for depression. The patient is not nervous/anxious and does not have insomnia.       PAST MEDICAL HISTORY :  Past Medical History:  Diagnosis Date   Diabetes mellitus without complication (HCC)    GERD (gastroesophageal reflux disease)    Hyperlipidemia    Hypertension    Multiple myeloma (HCC)    Obesity    Thyroid cancer (HCC)    Thyroid disease     PAST SURGICAL HISTORY :   Past Surgical History:  Procedure Laterality Date   BONE MARROW BIOPSY  2014   CATARACT EXTRACTION BILATERAL W/ ANTERIOR VITRECTOMY     IR BONE MARROW BIOPSY & ASPIRATION  06/01/2023    FAMILY HISTORY :   Family History  Problem Relation Age of Onset   Cancer Father 65   Diabetes Mother     SOCIAL HISTORY:   Social History   Tobacco Use   Smoking status: Never   Smokeless tobacco: Never  Substance Use Topics   Alcohol use: No   Drug use: No    ALLERGIES:  has no known allergies.  MEDICATIONS:  Current Outpatient Medications  Medication Sig Dispense Refill   acyclovir  (ZOVIRAX ) 400 MG tablet Take 1 tablet (400 mg total) by mouth 2 (two) times daily. 120 tablet 2   amLODipine  (NORVASC ) 10 MG tablet Take 10 mg by mouth daily.     aspirin EC 81 MG tablet Take 81 mg by mouth daily.     atorvastatin  (LIPITOR) 20 MG tablet Take 20 mg by mouth daily.     chlorproMAZINE  (THORAZINE ) 25 MG tablet Take 1 tablet (25 mg total) by mouth 3 (three) times daily. 60 tablet 1   Cyanocobalamin 1000 MCG TBCR Take by mouth.     cyclobenzaprine  (FLEXERIL ) 5 MG tablet Take 1 tablet (5 mg total) by mouth 3 (three) times daily as needed. 15 tablet 0   dapagliflozin  propanediol (FARXIGA) 10 MG TABS tablet TAKE 10 MG BY MOUTH 1 (ONE) TIME EACH DAY IN THE MORNING     ergocalciferol  (VITAMIN D2) 1.25 MG (50000 UT) capsule Take 1 capsule (50,000 Units total) by mouth once a week. 12 capsule 1   glipiZIDE (GLUCOTROL) 5 MG tablet Take 5 mg by mouth every morning.     levothyroxine  (SYNTHROID ) 137 MCG tablet Take 1 tablet by mouth daily.      ondansetron  (ZOFRAN ) 8 MG tablet One pill every 8 hours as needed for nausea/vomitting. 60 tablet 1   ONETOUCH DELICA LANCETS 33G MISC Inject 1 Lancet as directed once daily.     potassium chloride  SA (K-DUR,KLOR-CON ) 20 MEQ tablet Take 20 mEq by mouth 2 (two) times daily.   0   prochlorperazine  (COMPAZINE ) 10 MG tablet Take 1 tablet (10 mg total) by mouth every 6 (six) hours as needed for nausea or vomiting. 40 tablet 1   sodium bicarbonate 650 MG tablet Take 650 mg by mouth 2 (two) times daily.     traMADol  (ULTRAM ) 50 MG tablet Take 1 tablet (50 mg total) by mouth every 12 (twelve) hours as needed.  60 tablet 0   No current facility-administered medications for this visit.    PHYSICAL EXAMINATION: ECOG PERFORMANCE STATUS: 0 - Asymptomatic  BP 129/85 (BP Location: Right Arm, Patient Position: Sitting)   Pulse 94   Temp (!) 96 F (35.6 C) (Tympanic)   Resp 18   Ht 5' 10 (1.778 m)   Wt 193 lb 4.8 oz (87.7 kg)   SpO2 100%   BMI 27.74 kg/m   Filed Weights   08/28/23 1034  Weight: 193 lb 4.8 oz (87.7 kg)        Physical Exam Vitals and nursing note reviewed.  HENT:     Head: Normocephalic and atraumatic.     Mouth/Throat:     Pharynx: Oropharynx is clear. No oropharyngeal exudate.  Eyes:     Extraocular Movements: Extraocular movements intact.     Pupils: Pupils are equal, round, and reactive to light.  Cardiovascular:     Rate and Rhythm: Normal rate and regular rhythm.  Pulmonary:     Effort: No respiratory distress.     Breath sounds: No wheezing.     Comments: Decreased breath sounds  bilaterally.  Abdominal:     General: Bowel sounds are normal. There is no distension.     Palpations: Abdomen is soft. There is no mass.     Tenderness: There is no abdominal tenderness. There is no guarding or rebound.  Musculoskeletal:        General: No tenderness. Normal range of motion.     Cervical back: Normal range of motion and neck supple.  Skin:    General: Skin is warm.  Neurological:     General: No focal deficit present.     Mental Status: He is alert and oriented to person, place, and time.  Psychiatric:        Mood and Affect: Affect normal.        Behavior: Behavior normal.        Judgment: Judgment normal.       LABORATORY DATA:  I have reviewed the data as listed    Component Value Date/Time   NA 139 08/28/2023 1006   NA 139 05/01/2014 1322   K 4.1 08/28/2023 1006   K 4.4 05/01/2014 1322   CL 109 08/28/2023 1006   CL 107 05/01/2014 1322   CO2 23 08/28/2023 1006   CO2 27 05/01/2014 1322   GLUCOSE 206 (H) 08/28/2023 1006   GLUCOSE 102 (H) 05/01/2014 1322   BUN 19 08/28/2023 1006   BUN 16 05/01/2014 1322   CREATININE 1.10 08/28/2023 1006   CREATININE 1.48 (H) 05/29/2014 0949   CALCIUM 8.5 (L) 08/28/2023 1006   CALCIUM 9.1 05/01/2014 1322   PROT 6.3 (L) 08/28/2023 1006   PROT 7.5 05/01/2014 1322   ALBUMIN 3.7 08/28/2023 1006   ALBUMIN 3.6 05/01/2014 1322   AST 14 (L) 08/28/2023 1006   ALT 13 08/28/2023 1006   ALT 13 (L) 05/01/2014 1322   ALKPHOS 74 08/28/2023 1006   ALKPHOS 47 05/01/2014 1322   BILITOT 0.8 08/28/2023 1006   GFRNONAA >60 08/28/2023 1006   GFRNONAA 47 (L) 05/29/2014 0949   GFRAA 47 (L) 10/21/2019 1106   GFRAA 55 (L) 05/29/2014 0949    No results found for: SPEP, UPEP  Lab Results  Component Value Date   WBC 6.2 08/28/2023   NEUTROABS 4.9 08/28/2023   HGB 13.0 08/28/2023   HCT 38.3 (L) 08/28/2023   MCV 96.5 08/28/2023   PLT 331 08/28/2023  Chemistry      Component Value Date/Time   NA 139 08/28/2023 1006    NA 139 05/01/2014 1322   K 4.1 08/28/2023 1006   K 4.4 05/01/2014 1322   CL 109 08/28/2023 1006   CL 107 05/01/2014 1322   CO2 23 08/28/2023 1006   CO2 27 05/01/2014 1322   BUN 19 08/28/2023 1006   BUN 16 05/01/2014 1322   CREATININE 1.10 08/28/2023 1006   CREATININE 1.48 (H) 05/29/2014 0949      Component Value Date/Time   CALCIUM 8.5 (L) 08/28/2023 1006   CALCIUM 9.1 05/01/2014 1322   ALKPHOS 74 08/28/2023 1006   ALKPHOS 47 05/01/2014 1322   AST 14 (L) 08/28/2023 1006   ALT 13 08/28/2023 1006   ALT 13 (L) 05/01/2014 1322   BILITOT 0.8 08/28/2023 1006       RADIOGRAPHIC STUDIES: I have personally reviewed the radiological images as listed and agreed with the findings in the report. No results found.   ASSESSMENT & PLAN:  Multiple myeloma in relapse (HCC) #Multiple myeloma-PROGRESSED ON  maintenance Revlimid .  JAN 2025-  M-protein- 1.3[April 2023- NEG]; kappa=113 lambda light chain ratio=2.5 [July 2020- 2.0]. RISING- ca- 10.4. # APRIL 2025- . Diffuse lytic myelomatous lesions throughout the skeleton. Most of these lesions demonstrate hypermetabolism; Healing left mid clavicle fracture, likely pathologic; Early nondisplaced pathological fracture involving the right mid clavicle; Left pubic bone lesion has a defect in the outer cortex and this could be a pending pathologic fracture; . No findings for extraskeletal myeloma. Bone marrow Bx- on APRIL 25th, 2025- Pending results.  April 2025 discontinue lenalidomide . On Dara Velcade  dexamethasone .    # proceed with Dara Velcade  dexamethasone .  cycle #3 day  today. Labs-CBC/chemistries were reviewed with the patient.  Patient interested in CAR-T therapy in mid-sep 2025. Patient reached out to [facility] - awaiting labs on july31st at [facility].  Continue current therapy for now.  # Diarrhea intermittent/hiccups/weight loss- improved- continue 20 mg of dexamethasone - stable.  # Renal: Hyperkalemia/hypokalemia- rCONTINUE  Kdur 2 pills  BID #   baseline creatinine 1.3-1.4; peak 1.8]. SABRA April 2024- Vit D 38- recommend Vit D 50,K/weekly-  CONTINUE TO HOLD QUINAPRIL -  stable-weight gain/leg swelling-recommend leg elevation compression stockings- stable.  # Acute back pain: Progressive bone lesions on PET scan April 2025-status post Zometa  improved with tramadol  at bedtime stable.  # DM- on OHA -recommend close monitoring of blood glucose levels. Recommend bringing log at this time.  stable.  # Hypothyroidism: on synthroid -  stable.  # infection prophylaxis: Continue  acyclovir  twice a day.   stable  # ACP [06/05/2023]:  full code.   For 1st 2 cycles- Velcade  twice weekly- Dara-Velcade  q Weekly x 4 cycles- starting # 5-8 dara q 3W- MD only on day-1; cycles 9+- dara q 28 days  valcade;  Zometa  q 6 M-last 08/07/2023-  PS;  Disposition: AM preference # q 4 week- MM panel; K/l light chains #AS PER IS- - Dr.B     Orders Placed This Encounter  Procedures   Multiple Myeloma Panel (SPEP&IFE w/QIG)    Standing Status:   Standing    Number of Occurrences:   10    Expiration Date:   08/27/2024   Kappa/lambda light chains    Standing Status:   Standing    Number of Occurrences:   10    Expiration Date:   08/27/2024    All questions were answered. The patient knows to call the clinic with  any problems, questions or concerns.      Alan JONELLE Joe, MD 08/28/2023 11:06 AM

## 2023-08-28 NOTE — Assessment & Plan Note (Signed)
#  Multiple myeloma-PROGRESSED ON  maintenance Revlimid .  JAN 2025-  M-protein- 1.3[April 2023- NEG]; kappa=113 lambda light chain ratio=2.5 [July 2020- 2.0]. RISING- ca- 10.4. # APRIL 2025- . Diffuse lytic myelomatous lesions throughout the skeleton. Most of these lesions demonstrate hypermetabolism; Healing left mid clavicle fracture, likely pathologic; Early nondisplaced pathological fracture involving the right mid clavicle; Left pubic bone lesion has a defect in the outer cortex and this could be a pending pathologic fracture; . No findings for extraskeletal myeloma. Bone marrow Bx- on APRIL 25th, 2025- Pending results.  April 2025 discontinue lenalidomide . On Dara Velcade  dexamethasone .    # proceed with Dara Velcade  dexamethasone .  cycle #3 day  today. Labs-CBC/chemistries were reviewed with the patient.  Patient interested in CAR-T therapy in mid-sep 2025. Patient reached out to [facility] - awaiting labs on july31st at [facility].  Continue current therapy for now.  # Diarrhea intermittent/hiccups/weight loss- improved- continue 20 mg of dexamethasone - stable.  # Renal: Hyperkalemia/hypokalemia- rCONTINUE  Kdur 2 pills  BID #  baseline creatinine 1.3-1.4; peak 1.8]. SABRA April 2024- Vit D 38- recommend Vit D 50,K/weekly-  CONTINUE TO HOLD QUINAPRIL -  stable-weight gain/leg swelling-recommend leg elevation compression stockings- stable.  # Acute back pain: Progressive bone lesions on PET scan April 2025-status post Zometa  improved with tramadol  at bedtime stable.  # DM- on OHA -recommend close monitoring of blood glucose levels. Recommend bringing log at this time.  stable.  # Hypothyroidism: on synthroid -  stable.  # infection prophylaxis: Continue  acyclovir  twice a day.   stable  # ACP [06/05/2023]:  full code.   For 1st 2 cycles- Velcade  twice weekly- Dara-Velcade  q Weekly x 4 cycles- starting # 5-8 dara q 3W- MD only on day-1; cycles 9+- dara q 28 days  valcade;  Zometa  q 6 M-last  08/07/2023-  PS;  Disposition: AM preference # q 4 week- MM panel; K/l light chains #AS PER IS- - Dr.B

## 2023-08-28 NOTE — Progress Notes (Signed)
 Nutrition Follow-up:  Patient with multiple myeloma.  Receiving dartumumab, velcade , dexamethasone .    Met with pt during infusion.  Reports that his appetite is still good.  Eats breakfast and supper.  Has been having a glucerna shake around 2-3 pm.  Reports watery, loose stool 1-2 times a day for awhile but recently this has changed to 1 time a day and more formed stool.    Meets with [facility] on 7/31  Medications: reviewed  Labs: glucose 206  Anthropometrics:   Weight 193 lb 4.8 oz today  199 lb on 5/30 212 lb on 5/6 240 lb on 02/12/23   NUTRITION DIAGNOSIS: Inadequate oral intake stable but weight keeps trending down (?fluid)   INTERVENTION:  Continue glucerna shake daily Add 1/2 sandwich, peanut butter nabs with glucerna at 2-3pm.   Encouraged good nutrition and weight maintenance.  Patient verbalized understanding    MONITORING, EVALUATION, GOAL: weight trends, intake   NEXT VISIT: Tuesday, August 12 during infusion  Gricelda Foland B. Dasie SOLON, CSO, LDN Registered Dietitian [phone]

## 2023-08-29 LAB — KAPPA/LAMBDA LIGHT CHAINS
Kappa free light chain: 12.1 mg/L (ref 3.3–19.4)
Kappa, lambda light chain ratio: 2.88 — ABNORMAL HIGH (ref 0.26–1.65)
Lambda free light chains: 4.2 mg/L — ABNORMAL LOW (ref 5.7–26.3)

## 2023-08-29 LAB — MULTIPLE MYELOMA PANEL, SERUM
Albumin SerPl Elph-Mcnc: 3.5 g/dL (ref 2.9–4.4)
Albumin/Glob SerPl: 1.7 (ref 0.7–1.7)
Alpha 1: 0.2 g/dL (ref 0.0–0.4)
Alpha2 Glob SerPl Elph-Mcnc: 0.7 g/dL (ref 0.4–1.0)
B-Globulin SerPl Elph-Mcnc: 0.8 g/dL (ref 0.7–1.3)
Gamma Glob SerPl Elph-Mcnc: 0.4 g/dL (ref 0.4–1.8)
Globulin, Total: 2.1 g/dL — ABNORMAL LOW (ref 2.2–3.9)
IgA: 91 mg/dL (ref 61–437)
IgG (Immunoglobin G), Serum: 572 mg/dL — ABNORMAL LOW (ref 603–1613)
IgM (Immunoglobulin M), Srm: 11 mg/dL — ABNORMAL LOW (ref 15–143)
M Protein SerPl Elph-Mcnc: 0.1 g/dL — ABNORMAL HIGH
Total Protein ELP: 5.6 g/dL — ABNORMAL LOW (ref 6.0–8.5)

## 2023-09-02 ENCOUNTER — Other Ambulatory Visit: Payer: Self-pay

## 2023-09-04 ENCOUNTER — Inpatient Hospital Stay

## 2023-09-04 VITALS — BP 135/91 | HR 96 | Temp 95.9°F | Resp 18 | Ht 70.0 in | Wt 189.2 lb

## 2023-09-04 DIAGNOSIS — C9002 Multiple myeloma in relapse: Secondary | ICD-10-CM

## 2023-09-04 DIAGNOSIS — Z5111 Encounter for antineoplastic chemotherapy: Secondary | ICD-10-CM | POA: Diagnosis not present

## 2023-09-04 LAB — CBC WITH DIFFERENTIAL (CANCER CENTER ONLY)
Abs Immature Granulocytes: 0.01 K/uL (ref 0.00–0.07)
Basophils Absolute: 0 K/uL (ref 0.0–0.1)
Basophils Relative: 0 %
Eosinophils Absolute: 0.1 K/uL (ref 0.0–0.5)
Eosinophils Relative: 1 %
HCT: 43.1 % (ref 39.0–52.0)
Hemoglobin: 14.4 g/dL (ref 13.0–17.0)
Immature Granulocytes: 0 %
Lymphocytes Relative: 9 %
Lymphs Abs: 0.4 K/uL — ABNORMAL LOW (ref 0.7–4.0)
MCH: 32 pg (ref 26.0–34.0)
MCHC: 33.4 g/dL (ref 30.0–36.0)
MCV: 95.8 fL (ref 80.0–100.0)
Monocytes Absolute: 0.7 K/uL (ref 0.1–1.0)
Monocytes Relative: 15 %
Neutro Abs: 3.4 K/uL (ref 1.7–7.7)
Neutrophils Relative %: 75 %
Platelet Count: 265 K/uL (ref 150–400)
RBC: 4.5 MIL/uL (ref 4.22–5.81)
RDW: 15.2 % (ref 11.5–15.5)
WBC Count: 4.6 K/uL (ref 4.0–10.5)
nRBC: 0 % (ref 0.0–0.2)

## 2023-09-04 LAB — CMP (CANCER CENTER ONLY)
ALT: 18 U/L (ref 0–44)
AST: 18 U/L (ref 15–41)
Albumin: 4.2 g/dL (ref 3.5–5.0)
Alkaline Phosphatase: 65 U/L (ref 38–126)
Anion gap: 8 (ref 5–15)
BUN: 19 mg/dL (ref 8–23)
CO2: 24 mmol/L (ref 22–32)
Calcium: 9.1 mg/dL (ref 8.9–10.3)
Chloride: 102 mmol/L (ref 98–111)
Creatinine: 1.14 mg/dL (ref 0.61–1.24)
GFR, Estimated: >60 mL/min (ref >60)
Glucose, Bld: 155 mg/dL — ABNORMAL HIGH (ref 70–99)
Potassium: 4.6 mmol/L (ref 3.5–5.1)
Sodium: 134 mmol/L — ABNORMAL LOW (ref 135–145)
Total Bilirubin: 0.9 mg/dL (ref 0.0–1.2)
Total Protein: 6.8 g/dL (ref 6.5–8.1)

## 2023-09-04 MED ORDER — DEXAMETHASONE 4 MG PO TABS
10.0000 mg | ORAL_TABLET | Freq: Once | ORAL | Status: AC
  Administered 2023-09-04: 10 mg via ORAL
  Filled 2023-09-04: qty 3

## 2023-09-04 MED ORDER — BORTEZOMIB CHEMO SQ INJECTION 3.5 MG (2.5MG/ML)
1.3000 mg/m2 | Freq: Once | INTRAMUSCULAR | Status: AC
  Administered 2023-09-04: 2.75 mg via SUBCUTANEOUS
  Filled 2023-09-04: qty 1.1

## 2023-09-04 NOTE — Patient Instructions (Signed)
 [name] - A DEPT OF [facility]  Discharge Instructions: Thank you for choosing [facility] to provide your oncology and hematology care.  If you have a lab appointment with the Cancer Center, please go directly to the Cancer Center and check in at the registration area.  Wear comfortable clothing and clothing appropriate for easy access to any Portacath or PICC line.   We strive to give you quality time with your provider. You may need to reschedule your appointment if you arrive late (15 or more minutes).  Arriving late affects you and other patients whose appointments are after yours.  Also, if you miss three or more appointments without notifying the office, you may be dismissed from the clinic at the provider's discretion.      For prescription refill requests, have your pharmacy contact our office and allow 72 hours for refills to be completed.    Today you received the following chemotherapy and/or immunotherapy agents VELCADE      To help prevent nausea and vomiting after your treatment, we encourage you to take your nausea medication as directed.  BELOW ARE SYMPTOMS THAT SHOULD BE REPORTED IMMEDIATELY: *FEVER GREATER THAN 100.4 F (38 C) OR HIGHER *CHILLS OR SWEATING *NAUSEA AND VOMITING THAT IS NOT CONTROLLED WITH YOUR NAUSEA MEDICATION *UNUSUAL SHORTNESS OF BREATH *UNUSUAL BRUISING OR BLEEDING *URINARY PROBLEMS (pain or burning when urinating, or frequent urination) *BOWEL PROBLEMS (unusual diarrhea, constipation, pain near the anus) TENDERNESS IN MOUTH AND THROAT WITH OR WITHOUT PRESENCE OF ULCERS (sore throat, sores in mouth, or a toothache) UNUSUAL RASH, SWELLING OR PAIN  UNUSUAL VAGINAL DISCHARGE OR ITCHING   Items with * indicate a potential emergency and should be followed up as soon as possible or go to the Emergency Department if any problems should occur.  Please show the CHEMOTHERAPY ALERT CARD or IMMUNOTHERAPY  ALERT CARD at check-in to the Emergency Department and triage nurse.  Should you have questions after your visit or need to cancel or reschedule your appointment, please contact [name] - A DEPT OF [facility]  [phone] and follow the prompts.  Office hours are 8:00 a.m. to 4:30 p.m. Monday - Friday. Please note that voicemails left after 4:00 p.m. may not be returned until the following business day.  We are closed weekends and major holidays. You have access to a nurse at all times for urgent questions. Please call the main number to the clinic [phone] and follow the prompts.  For any non-urgent questions, you may also contact your provider using MyChart. We now offer e-Visits for anyone 65 and older to request care online for non-urgent symptoms. For details visit [email or web address].   Also download the MyChart app! Go to the app store, search "MyChart", open the app, select [facility], and log in with your MyChart username and password.   Bortezomib Injection What is this medication? BORTEZOMIB (bor TEZ oh mib) treats lymphoma. It may also be used to treat multiple myeloma, a type of bone marrow cancer. It works by blocking a protein that causes cancer cells to grow and multiply. This helps to slow or stop the spread of cancer cells. This medicine may be used for other purposes; ask your health care provider or pharmacist if you have questions. COMMON BRAND NAME(S): BORUZU, Velcade What should I tell my care team before I take this medication? They need to know if you have any  of these conditions: Dehydration Diabetes Heart disease Liver disease Tingling of the fingers or toes or other nerve disorder An unusual or allergic reaction to bortezomib, other medications, foods, dyes, or preservatives If you or your partner are pregnant or trying to get pregnant Breastfeeding How should I use this medication? This medication is injected into  a vein or under the skin. It is given by your care team in a hospital or clinic setting. Talk to your care team about the use of this medication in children. Special care may be needed. Overdosage: If you think you have taken too much of this medicine contact a poison control center or emergency room at once. NOTE: This medicine is only for you. Do not share this medicine with others. What if I miss a dose? Keep appointments for follow-up doses. It is important not to miss your dose. Call your care team if you are unable to keep an appointment. What may interact with this medication? Ketoconazole Rifampin This list may not describe all possible interactions. Give your health care provider a list of all the medicines, herbs, non-prescription drugs, or dietary supplements you use. Also tell them if you smoke, drink alcohol, or use illegal drugs. Some items may interact with your medicine. What should I watch for while using this medication? Your condition will be monitored carefully while you are receiving this medication. You may need blood work while taking this medication. This medication may affect your coordination, reaction time, or judgment. Do not drive or operate machinery until you know how this medication affects you. Sit up or stand slowly to reduce the risk of dizzy or fainting spells. Drinking alcohol with this medication can increase the risk of these side effects. This medication may increase your risk of getting an infection. Call your care team for advice if you get a fever, chills, sore throat, or other symptoms of a cold or flu. Do not treat yourself. Try to avoid being around people who are sick. Check with your care team if you have severe diarrhea, nausea, and vomiting, or if you sweat a lot. The loss of too much body fluid may make it dangerous for you to take this medication. Talk to your care team if you may be pregnant. Serious birth defects can occur if you take this  medication during pregnancy and for 7 months after the last dose. You will need a negative pregnancy test before starting this medication. Contraception is recommended while taking this medication and for 7 months after the last dose. Your care team can help you find the option that works for you. If your partner can get pregnant, use a condom during sex while taking this medication and for 4 months after the last dose. Do not breastfeed while taking this medication and for 2 months after the last dose. This medication may cause infertility. Talk to your care team if you are concerned about your fertility. What side effects may I notice from receiving this medication? Side effects that you should report to your care team as soon as possible: Allergic reactions--skin rash, itching, hives, swelling of the face, lips, tongue, or throat Bleeding--bloody or black, tar-like stools, vomiting blood or brown material that looks like coffee grounds, red or dark brown urine, small red or purple spots on skin, unusual bruising or bleeding Bleeding in the brain--severe headache, stiff neck, confusion, dizziness, change in vision, numbness or weakness of the face, arm, or leg, trouble speaking, trouble walking, vomiting Bowel blockage--stomach cramping, unable  to have a bowel movement or pass gas, loss of appetite, vomiting Heart failure--shortness of breath, swelling of the ankles, feet, or hands, sudden weight gain, unusual weakness or fatigue Infection--fever, chills, cough, sore throat, wounds that don't heal, pain or trouble when passing urine, general feeling of discomfort or being unwell Liver injury--right upper belly pain, loss of appetite, nausea, light-colored stool, dark yellow or brown urine, yellowing skin or eyes, unusual weakness or fatigue Low blood pressure--dizziness, feeling faint or lightheaded, blurry vision Lung injury--shortness of breath or trouble breathing, cough, spitting up blood, chest  pain, fever Pain, tingling, or numbness in the hands or feet Severe or prolonged diarrhea Stomach pain, bloody diarrhea, pale skin, unusual weakness or fatigue, decrease in the amount of urine, which may be signs of hemolytic uremic syndrome Sudden and severe headache, confusion, change in vision, seizures, which may be signs of posterior reversible encephalopathy syndrome (PRES) TTP--purple spots on the skin or inside the mouth, pale skin, yellowing skin or eyes, unusual weakness or fatigue, fever, fast or irregular heartbeat, confusion, change in vision, trouble speaking, trouble walking Tumor lysis syndrome (TLS)--nausea, vomiting, diarrhea, decrease in the amount of urine, dark urine, unusual weakness or fatigue, confusion, muscle pain or cramps, fast or irregular heartbeat, joint pain Side effects that usually do not require medical attention (report to your care team if they continue or are bothersome): Constipation Diarrhea Fatigue Loss of appetite Nausea This list may not describe all possible side effects. Call your doctor for medical advice about side effects. You may report side effects to FDA at 1-800-FDA-1088. Where should I keep my medication? This medication is given in a hospital or clinic. It will not be stored at home. NOTE: This sheet is a summary. It may not cover all possible information. If you have questions about this medicine, talk to your doctor, pharmacist, or health care provider.  2024 Elsevier/Gold Standard (2021-06-28 00:00:00)

## 2023-09-05 LAB — KAPPA/LAMBDA LIGHT CHAINS
Kappa free light chain: 14.4 mg/L (ref 3.3–19.4)
Kappa, lambda light chain ratio: 2.29 — ABNORMAL HIGH (ref 0.26–1.65)
Lambda free light chains: 6.3 mg/L (ref 5.7–26.3)

## 2023-09-07 LAB — MULTIPLE MYELOMA PANEL, SERUM
Albumin SerPl Elph-Mcnc: 3.8 g/dL (ref 2.9–4.4)
Albumin/Glob SerPl: 1.6 (ref 0.7–1.7)
Alpha 1: 0.2 g/dL (ref 0.0–0.4)
Alpha2 Glob SerPl Elph-Mcnc: 0.8 g/dL (ref 0.4–1.0)
B-Globulin SerPl Elph-Mcnc: 1 g/dL (ref 0.7–1.3)
Gamma Glob SerPl Elph-Mcnc: 0.6 g/dL (ref 0.4–1.8)
Globulin, Total: 2.5 g/dL (ref 2.2–3.9)
IgA: 110 mg/dL (ref 61–437)
IgG (Immunoglobin G), Serum: 665 mg/dL (ref 603–1613)
IgM (Immunoglobulin M), Srm: 15 mg/dL (ref 15–143)
M Protein SerPl Elph-Mcnc: 0.2 g/dL — ABNORMAL HIGH
Total Protein ELP: 6.3 g/dL (ref 6.0–8.5)

## 2023-09-10 ENCOUNTER — Telehealth: Payer: Self-pay | Admitting: *Deleted

## 2023-09-10 ENCOUNTER — Other Ambulatory Visit: Payer: Self-pay | Admitting: Internal Medicine

## 2023-09-10 ENCOUNTER — Encounter: Payer: Self-pay | Admitting: Internal Medicine

## 2023-09-10 NOTE — Progress Notes (Signed)
 Returned the phone call for Miss Alan Alexander at [place]. Left a voicemail to call back. GB

## 2023-09-10 NOTE — Telephone Encounter (Signed)
 I spoke to Damien Bellini NP at [facility] at [phone].-   Plan for collection on 8/18- will move appts-  Please schedule GB

## 2023-09-10 NOTE — Telephone Encounter (Signed)
 Damien Bellini NP at [facility].  Would like to discuss plan of care/treatment for patient.  Please call back at [phone].

## 2023-09-11 ENCOUNTER — Other Ambulatory Visit: Payer: Self-pay

## 2023-09-18 ENCOUNTER — Ambulatory Visit

## 2023-09-18 ENCOUNTER — Encounter

## 2023-09-18 ENCOUNTER — Other Ambulatory Visit

## 2023-09-18 ENCOUNTER — Ambulatory Visit: Admitting: Internal Medicine

## 2023-09-24 ENCOUNTER — Other Ambulatory Visit: Payer: Self-pay

## 2023-09-25 ENCOUNTER — Inpatient Hospital Stay

## 2023-09-25 ENCOUNTER — Inpatient Hospital Stay: Attending: Internal Medicine

## 2023-09-25 ENCOUNTER — Ambulatory Visit

## 2023-09-25 ENCOUNTER — Encounter: Payer: Self-pay | Admitting: Internal Medicine

## 2023-09-25 ENCOUNTER — Inpatient Hospital Stay (HOSPITAL_BASED_OUTPATIENT_CLINIC_OR_DEPARTMENT_OTHER): Admitting: Internal Medicine

## 2023-09-25 VITALS — BP 116/80 | HR 98 | Temp 96.7°F | Resp 18 | Ht 70.0 in | Wt 194.8 lb

## 2023-09-25 DIAGNOSIS — N183 Chronic kidney disease, stage 3 unspecified: Secondary | ICD-10-CM | POA: Insufficient documentation

## 2023-09-25 DIAGNOSIS — C9002 Multiple myeloma in relapse: Secondary | ICD-10-CM

## 2023-09-25 DIAGNOSIS — Z5111 Encounter for antineoplastic chemotherapy: Secondary | ICD-10-CM | POA: Diagnosis present

## 2023-09-25 DIAGNOSIS — E875 Hyperkalemia: Secondary | ICD-10-CM | POA: Diagnosis not present

## 2023-09-25 DIAGNOSIS — Z5112 Encounter for antineoplastic immunotherapy: Secondary | ICD-10-CM | POA: Insufficient documentation

## 2023-09-25 DIAGNOSIS — E039 Hypothyroidism, unspecified: Secondary | ICD-10-CM | POA: Diagnosis not present

## 2023-09-25 DIAGNOSIS — E876 Hypokalemia: Secondary | ICD-10-CM | POA: Diagnosis not present

## 2023-09-25 DIAGNOSIS — Z7952 Long term (current) use of systemic steroids: Secondary | ICD-10-CM | POA: Diagnosis not present

## 2023-09-25 DIAGNOSIS — R066 Hiccough: Secondary | ICD-10-CM | POA: Diagnosis not present

## 2023-09-25 DIAGNOSIS — M549 Dorsalgia, unspecified: Secondary | ICD-10-CM | POA: Insufficient documentation

## 2023-09-25 DIAGNOSIS — R197 Diarrhea, unspecified: Secondary | ICD-10-CM | POA: Insufficient documentation

## 2023-09-25 LAB — CBC WITH DIFFERENTIAL (CANCER CENTER ONLY)
Abs Immature Granulocytes: 0.01 K/uL (ref 0.00–0.07)
Basophils Absolute: 0 K/uL (ref 0.0–0.1)
Basophils Relative: 0 %
Eosinophils Absolute: 0.1 K/uL (ref 0.0–0.5)
Eosinophils Relative: 2 %
HCT: 39.1 % (ref 39.0–52.0)
Hemoglobin: 13.1 g/dL (ref 13.0–17.0)
Immature Granulocytes: 0 %
Lymphocytes Relative: 10 %
Lymphs Abs: 0.4 K/uL — ABNORMAL LOW (ref 0.7–4.0)
MCH: 32.5 pg (ref 26.0–34.0)
MCHC: 33.5 g/dL (ref 30.0–36.0)
MCV: 97 fL (ref 80.0–100.0)
Monocytes Absolute: 0.6 K/uL (ref 0.1–1.0)
Monocytes Relative: 14 %
Neutro Abs: 2.9 K/uL (ref 1.7–7.7)
Neutrophils Relative %: 74 %
Platelet Count: 181 K/uL (ref 150–400)
RBC: 4.03 MIL/uL — ABNORMAL LOW (ref 4.22–5.81)
RDW: 13.7 % (ref 11.5–15.5)
WBC Count: 4 K/uL (ref 4.0–10.5)
nRBC: 0 % (ref 0.0–0.2)

## 2023-09-25 LAB — CMP (CANCER CENTER ONLY)
ALT: 13 U/L (ref 0–44)
AST: 15 U/L (ref 15–41)
Albumin: 3.6 g/dL (ref 3.5–5.0)
Alkaline Phosphatase: 57 U/L (ref 38–126)
Anion gap: 8 (ref 5–15)
BUN: 12 mg/dL (ref 8–23)
CO2: 28 mmol/L (ref 22–32)
Calcium: 8.7 mg/dL — ABNORMAL LOW (ref 8.9–10.3)
Chloride: 101 mmol/L (ref 98–111)
Creatinine: 1.08 mg/dL (ref 0.61–1.24)
GFR, Estimated: >60 mL/min (ref >60)
Glucose, Bld: 160 mg/dL — ABNORMAL HIGH (ref 70–99)
Potassium: 3.6 mmol/L (ref 3.5–5.1)
Sodium: 137 mmol/L (ref 135–145)
Total Bilirubin: 1 mg/dL (ref 0.0–1.2)
Total Protein: 6.2 g/dL — ABNORMAL LOW (ref 6.5–8.1)

## 2023-09-25 MED ORDER — DARATUMUMAB-HYALURONIDASE-FIHJ 1800-30000 MG-UT/15ML ~~LOC~~ SOLN
1800.0000 mg | Freq: Once | SUBCUTANEOUS | Status: AC
  Administered 2023-09-25: 1800 mg via SUBCUTANEOUS
  Filled 2023-09-25: qty 15

## 2023-09-25 MED ORDER — DEXAMETHASONE 4 MG PO TABS
20.0000 mg | ORAL_TABLET | Freq: Once | ORAL | Status: AC
  Administered 2023-09-25: 20 mg via ORAL
  Filled 2023-09-25: qty 5

## 2023-09-25 MED ORDER — TRAMADOL HCL 50 MG PO TABS: 50.0000 mg | ORAL_TABLET | Freq: Two times a day (BID) | ORAL | 0 refills | Status: DC | PRN

## 2023-09-25 MED ORDER — ACETAMINOPHEN 325 MG PO TABS
650.0000 mg | ORAL_TABLET | Freq: Once | ORAL | Status: AC
  Administered 2023-09-25: 650 mg via ORAL
  Filled 2023-09-25: qty 2

## 2023-09-25 MED ORDER — DIPHENHYDRAMINE HCL 25 MG PO CAPS
50.0000 mg | ORAL_CAPSULE | Freq: Once | ORAL | Status: AC
  Administered 2023-09-25: 50 mg via ORAL
  Filled 2023-09-25: qty 2

## 2023-09-25 MED ORDER — BORTEZOMIB CHEMO SQ INJECTION 3.5 MG (2.5MG/ML)
1.3000 mg/m2 | Freq: Once | INTRAMUSCULAR | Status: AC
  Administered 2023-09-25: 2.75 mg via SUBCUTANEOUS
  Filled 2023-09-25: qty 1.1

## 2023-09-25 NOTE — Progress Notes (Signed)
 Nutrition Follow-up:  Patient with multiple myeloma.  Receiving daratumumab , velcade , dexamethasone . Received leukapheresis yesterday at [facility] in anticipation of CAR-T.    Met with patient and wife during infusion.  Reports that his appetite is doing good.  Drinking glucerna shake daily usually around lunch.  Breakfast on Sunday was 2 eggs, bacon, grits, toast and coffee.  Sunday evening ate Popeye Chicken and mashed potatoes.      Medications: reviewed  Labs: reviewed  Anthropometrics:   Weight 194 lb 12.8 oz  193 lb on 7/22 199 lb on 5/30 212 lb on 5/6 240 lb on 02/12/23    NUTRITION DIAGNOSIS: Inadequate oral intake stable   INTERVENTION:  Continue glucerna shake daily Encouraged well balanced diet including protein and plant foods    MONITORING, EVALUATION, GOAL: weight trends, intake   NEXT VISIT: Tuesday, Sept 19 during infusion  Margaux Engen B. Dasie SOLON, CSO, LDN Registered Dietitian [phone]

## 2023-09-25 NOTE — Progress Notes (Signed)
 Seen at [facility] yesterday. Notes in Media.  Needs refill tramadol , pended.

## 2023-09-25 NOTE — Assessment & Plan Note (Addendum)
#  Multiple myeloma-PROGRESSED ON  maintenance Revlimid .  JAN 2025-  M-protein- 1.3[April 2023- NEG]; kappa=113 lambda light chain ratio=2.5 [July 2020- 2.0]. RISING- ca- 10.4. # APRIL 2025- . Diffuse lytic myelomatous lesions throughout the skeleton. Most of these lesions demonstrate hypermetabolism; Healing left mid clavicle fracture, likely pathologic; Early nondisplaced pathological fracture involving the right mid clavicle; Left pubic bone lesion has a defect in the outer cortex and this could be a pending pathologic fracture; . No findings for extraskeletal myeloma. Bone marrow Bx- on APRIL 25th, 2025- Pending results.  April 2025 discontinue lenalidomide . On Dara Velcade  dexamethasone .    # 09/24/2023- Apheresis [[facility]]; last dose chemo [sep 16th]- LD chemo at [facility] on 1st OCT- and CART on OCT on 6th.   # proceed with bridging therapy Dara Velcade  dexamethasone .  cycle # 6 day-1  today. Labs-CBC/chemistries were reviewed with the patient.    # Diarrhea intermittent/hiccups/weight loss- improved- continue 20 mg of dexamethasone - stable.  # Renal: Hyperkalemia/hypokalemia- rCONTINUE  Kdur 2 pills  BID #  baseline creatinine 1.3-1.4; peak 1.8]. SABRA April 2024- Vit D 38- recommend Vit D 50,K/weekly-  CONTINUE TO HOLD QUINAPRIL -  stable-weight gain/leg swelling-recommend leg elevation compression stockings- stable.  # Acute back pain: Progressive bone lesions on PET scan April 2025-status post Zometa  improved with tramadol  at bedtime stable.  # DM- on OHA -recommend close monitoring of blood glucose levels. Recommend bringing log at this time.  stable.  # Hypothyroidism: on synthroid -  stable.  # infection prophylaxis: Continue  acyclovir  twice a day.  stable.  # ACP [06/05/2023]:  full code.   For 1st 2 cycles- Velcade  twice weekly- Dara-Velcade  q Weekly x 4 cycles- starting # 5-8 dara q 3W- MD only on day-1; cycles 9+- dara q 28 days  valcade;  Zometa  q 6 M-last 08/07/2023-  PS;  Disposition: AM  preference # q 4 week- MM panel; K/l light chains #AS PER IS- - Dr.B

## 2023-09-25 NOTE — Progress Notes (Signed)
 [name] OFFICE PROGRESS NOTE  Patient Care Team: Alan Norleen BIRCH, MD as PCP - General (Internal Medicine) Alan Kocher, MD as Consulting Physician (Dermatology) Alan Cindy SAUNDERS, MD as Consulting Physician (Oncology)   Cancer Staging  Multiple myeloma in relapse [facility]) Staging form: Plasma Cell Myeloma and Plasma Cell Disorders, AJCC 8th Edition - Clinical: No stage assigned - Unsigned - Clinical: RISS Stage I (Beta-2 -microglobulin (mg/L): 2.4, Albumin (g/dL): 3.5, ISS: Stage I, High-risk cytogenetics: Absent, LDH: Normal) - Signed by Alan Cindy SAUNDERS, MD on 06/19/2023 Beta 2 microglobulin range (mg/L): Less than 3.5 Albumin range (g/dL): Greater than or equal to 3.5 Cytogenetics: No abnormalities    Oncology History Overview Note   # April 2014- MULTIPLE MYELOMA [IgG- 2.2gm/dl; 59-49% plasma cell; Cyto-N; FISH- gain of chr.9, 15 & ccnd1/11q13] s/p RVD x6 [sep 2014; BMBx- ~5% plasma cells]; Maint Rev-DEX-Zometa ; March 2015-Stopped Dex Cont- Rev-Zometa ; July 2016- Rev 25mg ; NOV 4th  2016-HOLD; BMT eval at [facility] [Dr.Woods]; declined BMT.   # Re-START FEB 2017 Rev 15mg  3 W- on & 1 w OFF; HELD Rev June- Oct 2020- sec to worsening renal function [Dr.Singh]; SEP 2021- Rev 15 mg 2 w-On & 2 w-OFF. STOP REVLIMD in APRIL 2025-progression.    # APRIL 2025- . Diffuse lytic myelomatous lesions throughout the skeleton. Most of these lesions demonstrate hypermetabolism; Healing left mid clavicle fracture, likely pathologic; Early nondisplaced pathological fracture involving the right mid clavicle; Left pubic bone lesion has a defect in the outer cortex and this could be a pending pathologic fracture; . No findings for extraskeletal myeloma. Bone marrow Bx- on APRIL 25th, 2025- Pending.  # April 2025 discontinue lenalidomide .  # APRIL 29th, 2025- start Dara-Velcade  -Dex   # Zometa   # CKD [creat ~1.4; sec MM; Dr.Singh];  DM  -------------------------------------------------------------------------   DIAGNOSIS: Alan Alexander ] MULTIPLE MYELOMA  GOALS: control     Multiple myeloma in remission (HCC)  10/01/2015 Initial Diagnosis   Multiple myeloma in remission (HCC)   Multiple myeloma in relapse (HCC)  05/14/2023 Initial Diagnosis   Multiple myeloma in relapse (HCC)   06/05/2023 -  Chemotherapy   Patient is on Treatment Plan : MYELOMA RELAPSED / REFRACTORY Daratumumab  SQ + Bortezomib  + Dexamethasone  (DaraVd) q21d / Daratumumab  SQ q28d      06/05/2023 Cancer Staging   Staging form: Plasma Cell Myeloma and Plasma Cell Disorders, AJCC 8th Edition - Clinical: RISS Stage I (Beta-2 -microglobulin (mg/L): 2.4, Albumin (g/dL): 3.5, ISS: Stage I, High-risk cytogenetics: Absent, LDH: Normal) - Signed by Alan Cindy SAUNDERS, MD on 06/19/2023 Beta 2 microglobulin range (mg/L): Less than 3.5 Albumin range (g/dL): Greater than or equal to 3.5 Cytogenetics: No abnormalities    INTERVAL HISTORY: Accompanied by his wife. Ambulating independently.   Alan Alexander 78 y.o.  male pleasant patient above history of recurrent multiple myeloma; CKD stage III; DM on dara-VD  is to  proceed with chemotherapy.   In the interim patient was evaluated at [facility]-s/p apheresis in anticipation of CAR-T therapy.  Noted to have improvement of his hip pain. Patient currently taking  tramadol  1 pills  in AM and at bedtime prn.   Patient denies any skin rash. Patient also denies any tingling or numbness. Mild swelling in the legs.    Review of Systems  Constitutional:  Negative for chills, diaphoresis, fever, malaise/fatigue and weight loss.  HENT:  Negative for nosebleeds and sore throat.   Eyes:  Negative for double vision.  Respiratory:  Negative for cough, hemoptysis, sputum  production, shortness of breath and wheezing.   Cardiovascular:  Positive for leg swelling. Negative for chest pain, palpitations and orthopnea.  Gastrointestinal:   Negative for abdominal pain, blood in stool, constipation, diarrhea, heartburn, melena, nausea and vomiting.  Genitourinary:  Negative for dysuria, frequency and urgency.  Musculoskeletal:  Positive for back pain and joint pain.  Skin: Negative.  Negative for itching and rash.  Neurological:  Negative for dizziness, tingling, focal weakness, weakness and headaches.  Endo/Heme/Allergies:  Does not bruise/bleed easily.  Psychiatric/Behavioral:  Negative for depression. The patient is not nervous/anxious and does not have insomnia.       PAST MEDICAL HISTORY :  Past Medical History:  Diagnosis Date   Diabetes mellitus without complication (HCC)    GERD (gastroesophageal reflux disease)    Hyperlipidemia    Hypertension    Multiple myeloma (HCC)    Obesity    Thyroid cancer (HCC)    Thyroid disease     PAST SURGICAL HISTORY :   Past Surgical History:  Procedure Laterality Date   BONE MARROW BIOPSY  2014   CATARACT EXTRACTION BILATERAL W/ ANTERIOR VITRECTOMY     IR BONE MARROW BIOPSY & ASPIRATION  06/01/2023    FAMILY HISTORY :   Family History  Problem Relation Age of Onset   Cancer Father 54   Diabetes Mother     SOCIAL HISTORY:   Social History   Tobacco Use   Smoking status: Never   Smokeless tobacco: Never  Substance Use Topics   Alcohol use: No   Drug use: No    ALLERGIES:  has no known allergies.  MEDICATIONS:  Current Outpatient Medications  Medication Sig Dispense Refill   acyclovir  (ZOVIRAX ) 400 MG tablet Take 1 tablet (400 mg total) by mouth 2 (two) times daily. 120 tablet 2   amLODipine  (NORVASC ) 10 MG tablet Take 10 mg by mouth daily.     aspirin EC 81 MG tablet Take 81 mg by mouth daily.     atorvastatin  (LIPITOR) 20 MG tablet Take 20 mg by mouth daily.     chlorproMAZINE  (THORAZINE ) 25 MG tablet Take 1 tablet (25 mg total) by mouth 3 (three) times daily. 60 tablet 1   Cyanocobalamin 1000 MCG TBCR Take by mouth.     cyclobenzaprine  (FLEXERIL ) 5 MG  tablet Take 1 tablet (5 mg total) by mouth 3 (three) times daily as needed. 15 tablet 0   dapagliflozin propanediol (FARXIGA) 10 MG TABS tablet TAKE 10 MG BY MOUTH 1 (ONE) TIME EACH DAY IN THE MORNING     ergocalciferol  (VITAMIN D2) 1.25 MG (50000 UT) capsule Take 1 capsule (50,000 Units total) by mouth once a week. 12 capsule 1   glipiZIDE (GLUCOTROL) 5 MG tablet Take 5 mg by mouth every morning.     levothyroxine  (SYNTHROID ) 137 MCG tablet Take 1 tablet by mouth daily.      ondansetron  (ZOFRAN ) 8 MG tablet One pill every 8 hours as needed for nausea/vomitting. 60 tablet 1   ONETOUCH DELICA LANCETS 33G MISC Inject 1 Lancet as directed once daily.     potassium chloride  SA (K-DUR,KLOR-CON ) 20 MEQ tablet Take 20 mEq by mouth 2 (two) times daily.   0   prochlorperazine  (COMPAZINE ) 10 MG tablet Take 1 tablet (10 mg total) by mouth every 6 (six) hours as needed for nausea or vomiting. 40 tablet 1   sodium bicarbonate 650 MG tablet Take 650 mg by mouth 2 (two) times daily.     traMADol  (ULTRAM )  50 MG tablet Take 1 tablet (50 mg total) by mouth every 12 (twelve) hours as needed. 60 tablet 0   No current facility-administered medications for this visit.   Facility-Administered Medications Ordered in Other Visits  Medication Dose Route Frequency Provider Last Rate Last Admin   bortezomib  SQ (VELCADE ) chemo injection (2.5mg /mL concentration) 2.75 mg  1.3 mg/m2 (Treatment Plan Recorded) Subcutaneous Once Jhonathan Desroches R, MD       daratumumab -hyaluronidase -fihj (DARZALEX  FASPRO) 1800-30000 MG-UT/15ML chemo SQ injection 1,800 mg  1,800 mg Subcutaneous Once Tamanna Whitson R, MD        PHYSICAL EXAMINATION: ECOG PERFORMANCE STATUS: 0 - Asymptomatic  BP 116/80 (BP Location: Left Arm, Patient Position: Sitting, Cuff Size: Normal)   Pulse 98   Temp (!) 96.7 F (35.9 C) (Tympanic)   Resp 18   Ht 5' 10 (1.778 m)   Wt 194 lb 12.8 oz (88.4 kg)   SpO2 96%   BMI 27.95 kg/m   Filed Weights    09/25/23 0831  Weight: 194 lb 12.8 oz (88.4 kg)        Physical Exam Vitals and nursing note reviewed.  HENT:     Head: Normocephalic and atraumatic.     Mouth/Throat:     Pharynx: Oropharynx is clear. No oropharyngeal exudate.  Eyes:     Extraocular Movements: Extraocular movements intact.     Pupils: Pupils are equal, round, and reactive to light.  Cardiovascular:     Rate and Rhythm: Normal rate and regular rhythm.  Pulmonary:     Effort: No respiratory distress.     Breath sounds: No wheezing.     Comments: Decreased breath sounds bilaterally.  Abdominal:     General: Bowel sounds are normal. There is no distension.     Palpations: Abdomen is soft. There is no mass.     Tenderness: There is no abdominal tenderness. There is no guarding or rebound.  Musculoskeletal:        General: No tenderness. Normal range of motion.     Cervical back: Normal range of motion and neck supple.  Skin:    General: Skin is warm.  Neurological:     General: No focal deficit present.     Mental Status: He is alert and oriented to person, place, and time.  Psychiatric:        Mood and Affect: Affect normal.        Behavior: Behavior normal.        Judgment: Judgment normal.       LABORATORY DATA:  I have reviewed the data as listed    Component Value Date/Time   NA 137 09/25/2023 0833   NA 139 05/01/2014 1322   K 3.6 09/25/2023 0833   K 4.4 05/01/2014 1322   CL 101 09/25/2023 0833   CL 107 05/01/2014 1322   CO2 28 09/25/2023 0833   CO2 27 05/01/2014 1322   GLUCOSE 160 (H) 09/25/2023 0833   GLUCOSE 102 (H) 05/01/2014 1322   BUN 12 09/25/2023 0833   BUN 16 05/01/2014 1322   CREATININE 1.08 09/25/2023 0833   CREATININE 1.48 (H) 05/29/2014 0949   CALCIUM 8.7 (L) 09/25/2023 0833   CALCIUM 9.1 05/01/2014 1322   PROT 6.2 (L) 09/25/2023 0833   PROT 7.5 05/01/2014 1322   ALBUMIN 3.6 09/25/2023 0833   ALBUMIN 3.6 05/01/2014 1322   AST 15 09/25/2023 0833   ALT 13 09/25/2023  0833   ALT 13 (L) 05/01/2014 1322   ALKPHOS 57  09/25/2023 0833   ALKPHOS 47 05/01/2014 1322   BILITOT 1.0 09/25/2023 0833   GFRNONAA >60 09/25/2023 0833   GFRNONAA 47 (L) 05/29/2014 0949   GFRAA 47 (L) 10/21/2019 1106   GFRAA 55 (L) 05/29/2014 0949    No results found for: SPEP, UPEP  Lab Results  Component Value Date   WBC 4.0 09/25/2023   NEUTROABS 2.9 09/25/2023   HGB 13.1 09/25/2023   HCT 39.1 09/25/2023   MCV 97.0 09/25/2023   PLT 181 09/25/2023      Chemistry      Component Value Date/Time   NA 137 09/25/2023 0833   NA 139 05/01/2014 1322   K 3.6 09/25/2023 0833   K 4.4 05/01/2014 1322   CL 101 09/25/2023 0833   CL 107 05/01/2014 1322   CO2 28 09/25/2023 0833   CO2 27 05/01/2014 1322   BUN 12 09/25/2023 0833   BUN 16 05/01/2014 1322   CREATININE 1.08 09/25/2023 0833   CREATININE 1.48 (H) 05/29/2014 0949      Component Value Date/Time   CALCIUM 8.7 (L) 09/25/2023 0833   CALCIUM 9.1 05/01/2014 1322   ALKPHOS 57 09/25/2023 0833   ALKPHOS 47 05/01/2014 1322   AST 15 09/25/2023 0833   ALT 13 09/25/2023 0833   ALT 13 (L) 05/01/2014 1322   BILITOT 1.0 09/25/2023 0833       RADIOGRAPHIC STUDIES: I have personally reviewed the radiological images as listed and agreed with the findings in the report. No results found.   ASSESSMENT & PLAN:  Multiple myeloma in relapse (HCC) #Multiple myeloma-PROGRESSED ON  maintenance Revlimid .  JAN 2025-  M-protein- 1.3[April 2023- NEG]; kappa=113 lambda light chain ratio=2.5 [July 2020- 2.0]. RISING- ca- 10.4. # APRIL 2025- . Diffuse lytic myelomatous lesions throughout the skeleton. Most of these lesions demonstrate hypermetabolism; Healing left mid clavicle fracture, likely pathologic; Early nondisplaced pathological fracture involving the right mid clavicle; Left pubic bone lesion has a defect in the outer cortex and this could be a pending pathologic fracture; . No findings for extraskeletal myeloma. Bone marrow Bx- on  APRIL 25th, 2025- Pending results.  April 2025 discontinue lenalidomide . On Dara Velcade  dexamethasone .    # 09/24/2023- Apheresis [[facility]]; last dose chemo [sep 16th]- LD chemo at [facility] on 1st OCT- and CART on OCT on 6th.   # proceed with bridging therapy Dara Velcade  dexamethasone .  cycle # 6 day-1  today. Labs-CBC/chemistries were reviewed with the patient.    # Diarrhea intermittent/hiccups/weight loss- improved- continue 20 mg of dexamethasone - stable.  # Renal: Hyperkalemia/hypokalemia- rCONTINUE  Kdur 2 pills  BID #  baseline creatinine 1.3-1.4; peak 1.8]. SABRA April 2024- Vit D 38- recommend Vit D 50,K/weekly-  CONTINUE TO HOLD QUINAPRIL -  stable-weight gain/leg swelling-recommend leg elevation compression stockings- stable.  # Acute back pain: Progressive bone lesions on PET scan April 2025-status post Zometa  improved with tramadol  at bedtime stable.  # DM- on OHA -recommend close monitoring of blood glucose levels. Recommend bringing log at this time.  stable.  # Hypothyroidism: on synthroid -  stable.  # infection prophylaxis: Continue  acyclovir  twice a day.  stable.  # ACP [06/05/2023]:  full code.   For 1st 2 cycles- Velcade  twice weekly- Dara-Velcade  q Weekly x 4 cycles- starting # 5-8 dara q 3W- MD only on day-1; cycles 9+- dara q 28 days  valcade;  Zometa  q 6 M-last 08/07/2023-  PS;  Disposition: AM preference # q 4 week- MM panel; K/l light chains #AS PER  IS- - Dr.B     Orders Placed This Encounter  Procedures   CBC with Differential (Cancer Center Only)    Standing Status:   Future    Expected Date:   10/16/2023    Expiration Date:   10/15/2024   CMP (Cancer Center only)    Standing Status:   Future    Expected Date:   10/16/2023    Expiration Date:   10/15/2024   CBC with Differential (Cancer Center Only)    Standing Status:   Future    Expected Date:   10/23/2023    Expiration Date:   10/22/2024   CMP (Cancer Center only)    Standing Status:   Future    Expected Date:    10/23/2023    Expiration Date:   10/22/2024    All questions were answered. The patient knows to call the clinic with any problems, questions or concerns.      Cindy JONELLE Joe, MD 09/25/2023 9:40 AM

## 2023-09-25 NOTE — Patient Instructions (Signed)
 [name] - A DEPT OF [facility]  Discharge Instructions: Thank you for choosing [facility] to provide your oncology and hematology care.  If you have a lab appointment with the Cancer Center, please go directly to the Cancer Center and check in at the registration area.  Wear comfortable clothing and clothing appropriate for easy access to any Portacath or PICC line.   We strive to give you quality time with your provider. You may need to reschedule your appointment if you arrive late (15 or more minutes).  Arriving late affects you and other patients whose appointments are after yours.  Also, if you miss three or more appointments without notifying the office, you may be dismissed from the clinic at the provider's discretion.      For prescription refill requests, have your pharmacy contact our office and allow 72 hours for refills to be completed.    Today you received the following chemotherapy and/or immunotherapy agents Darzalex  and Velcade .      To help prevent nausea and vomiting after your treatment, we encourage you to take your nausea medication as directed.  BELOW ARE SYMPTOMS THAT SHOULD BE REPORTED IMMEDIATELY: *FEVER GREATER THAN 100.4 F (38 C) OR HIGHER *CHILLS OR SWEATING *NAUSEA AND VOMITING THAT IS NOT CONTROLLED WITH YOUR NAUSEA MEDICATION *UNUSUAL SHORTNESS OF BREATH *UNUSUAL BRUISING OR BLEEDING *URINARY PROBLEMS (pain or burning when urinating, or frequent urination) *BOWEL PROBLEMS (unusual diarrhea, constipation, pain near the anus) TENDERNESS IN MOUTH AND THROAT WITH OR WITHOUT PRESENCE OF ULCERS (sore throat, sores in mouth, or a toothache) UNUSUAL RASH, SWELLING OR PAIN  UNUSUAL VAGINAL DISCHARGE OR ITCHING   Items with * indicate a potential emergency and should be followed up as soon as possible or go to the Emergency Department if any problems should occur.  Please show the CHEMOTHERAPY ALERT CARD or  IMMUNOTHERAPY ALERT CARD at check-in to the Emergency Department and triage nurse.  Should you have questions after your visit or need to cancel or reschedule your appointment, please contact [name] - A DEPT OF [facility]  [phone] and follow the prompts.  Office hours are 8:00 a.m. to 4:30 p.m. Monday - Friday. Please note that voicemails left after 4:00 p.m. may not be returned until the following business day.  We are closed weekends and major holidays. You have access to a nurse at all times for urgent questions. Please call the main number to the clinic [phone] and follow the prompts.  For any non-urgent questions, you may also contact your provider using MyChart. We now offer e-Visits for anyone 60 and older to request care online for non-urgent symptoms. For details visit [email or web address].   Also download the MyChart app! Go to the app store, search "MyChart", open the app, select [facility], and log in with your MyChart username and password.

## 2023-09-26 ENCOUNTER — Other Ambulatory Visit: Payer: Self-pay

## 2023-09-27 ENCOUNTER — Other Ambulatory Visit: Payer: Self-pay

## 2023-10-02 ENCOUNTER — Inpatient Hospital Stay

## 2023-10-02 VITALS — BP 126/83 | HR 89 | Temp 98.1°F | Resp 16

## 2023-10-02 DIAGNOSIS — C9002 Multiple myeloma in relapse: Secondary | ICD-10-CM

## 2023-10-02 DIAGNOSIS — Z5111 Encounter for antineoplastic chemotherapy: Secondary | ICD-10-CM | POA: Diagnosis not present

## 2023-10-02 LAB — CBC WITH DIFFERENTIAL (CANCER CENTER ONLY)
Abs Immature Granulocytes: 0.03 K/uL (ref 0.00–0.07)
Basophils Absolute: 0 K/uL (ref 0.0–0.1)
Basophils Relative: 0 %
Eosinophils Absolute: 0.1 K/uL (ref 0.0–0.5)
Eosinophils Relative: 1 %
HCT: 39 % (ref 39.0–52.0)
Hemoglobin: 12.9 g/dL — ABNORMAL LOW (ref 13.0–17.0)
Immature Granulocytes: 1 %
Lymphocytes Relative: 10 %
Lymphs Abs: 0.6 K/uL — ABNORMAL LOW (ref 0.7–4.0)
MCH: 32.3 pg (ref 26.0–34.0)
MCHC: 33.1 g/dL (ref 30.0–36.0)
MCV: 97.5 fL (ref 80.0–100.0)
Monocytes Absolute: 0.8 K/uL (ref 0.1–1.0)
Monocytes Relative: 14 %
Neutro Abs: 4.1 K/uL (ref 1.7–7.7)
Neutrophils Relative %: 74 %
Platelet Count: 250 K/uL (ref 150–400)
RBC: 4 MIL/uL — ABNORMAL LOW (ref 4.22–5.81)
RDW: 13.6 % (ref 11.5–15.5)
WBC Count: 5.5 K/uL (ref 4.0–10.5)
nRBC: 0 % (ref 0.0–0.2)

## 2023-10-02 LAB — CMP (CANCER CENTER ONLY)
ALT: 18 U/L (ref 0–44)
AST: 16 U/L (ref 15–41)
Albumin: 3.7 g/dL (ref 3.5–5.0)
Alkaline Phosphatase: 56 U/L (ref 38–126)
Anion gap: 9 (ref 5–15)
BUN: 24 mg/dL — ABNORMAL HIGH (ref 8–23)
CO2: 22 mmol/L (ref 22–32)
Calcium: 9.1 mg/dL (ref 8.9–10.3)
Chloride: 105 mmol/L (ref 98–111)
Creatinine: 1.31 mg/dL — ABNORMAL HIGH (ref 0.61–1.24)
GFR, Estimated: 55 mL/min — ABNORMAL LOW (ref >60)
Glucose, Bld: 132 mg/dL — ABNORMAL HIGH (ref 70–99)
Potassium: 4.3 mmol/L (ref 3.5–5.1)
Sodium: 136 mmol/L (ref 135–145)
Total Bilirubin: 0.5 mg/dL (ref 0.0–1.2)
Total Protein: 6.3 g/dL — ABNORMAL LOW (ref 6.5–8.1)

## 2023-10-02 MED ORDER — BORTEZOMIB CHEMO SQ INJECTION 3.5 MG (2.5MG/ML)
1.3000 mg/m2 | Freq: Once | INTRAMUSCULAR | Status: AC
  Administered 2023-10-02: 2.75 mg via SUBCUTANEOUS
  Filled 2023-10-02: qty 1.1

## 2023-10-02 MED ORDER — DEXAMETHASONE 4 MG PO TABS
10.0000 mg | ORAL_TABLET | Freq: Once | ORAL | Status: AC
  Administered 2023-10-02: 10 mg via ORAL
  Filled 2023-10-02: qty 3

## 2023-10-02 NOTE — Patient Instructions (Signed)
 Bortezomib  Injection What is this medication? BORTEZOMIB  (bor TEZ oh mib) treats lymphoma. It may also be used to treat multiple myeloma, a type of bone marrow cancer. It works by blocking a protein that causes cancer cells to grow and multiply. This helps to slow or stop the spread of cancer cells. This medicine may be used for other purposes; ask your health care provider or pharmacist if you have questions. COMMON BRAND NAME(S): BORUZU , Velcade  What should I tell my care team before I take this medication? They need to know if you have any of these conditions: Dehydration Diabetes Heart disease Liver disease Tingling of the fingers or toes or other nerve disorder An unusual or allergic reaction to bortezomib , other medications, foods, dyes, or preservatives If you or your partner are pregnant or trying to get pregnant Breastfeeding How should I use this medication? This medication is injected into a vein or under the skin. It is given by your care team in a hospital or clinic setting. Talk to your care team about the use of this medication in children. Special care may be needed. Overdosage: If you think you have taken too much of this medicine contact a poison control center or emergency room at once. NOTE: This medicine is only for you. Do not share this medicine with others. What if I miss a dose? Keep appointments for follow-up doses. It is important not to miss your dose. Call your care team if you are unable to keep an appointment. What may interact with this medication? Ketoconazole Rifampin This list may not describe all possible interactions. Give your health care provider a list of all the medicines, herbs, non-prescription drugs, or dietary supplements you use. Also tell them if you smoke, drink alcohol, or use illegal drugs. Some items may interact with your medicine. What should I watch for while using this medication? Your condition will be monitored carefully while you  are receiving this medication. You may need blood work while taking this medication. This medication may affect your coordination, reaction time, or judgment. Do not drive or operate machinery until you know how this medication affects you. Sit up or stand slowly to reduce the risk of dizzy or fainting spells. Drinking alcohol with this medication can increase the risk of these side effects. This medication may increase your risk of getting an infection. Call your care team for advice if you get a fever, chills, sore throat, or other symptoms of a cold or flu. Do not treat yourself. Try to avoid being around people who are sick. Check with your care team if you have severe diarrhea, nausea, and vomiting, or if you sweat a lot. The loss of too much body fluid may make it dangerous for you to take this medication. Talk to your care team if you may be pregnant. Serious birth defects can occur if you take this medication during pregnancy and for 7 months after the last dose. You will need a negative pregnancy test before starting this medication. Contraception is recommended while taking this medication and for 7 months after the last dose. Your care team can help you find the option that works for you. If your partner can get pregnant, use a condom during sex while taking this medication and for 4 months after the last dose. Do not breastfeed while taking this medication and for 2 months after the last dose. This medication may cause infertility. Talk to your care team if you are concerned about your fertility. What side  effects may I notice from receiving this medication? Side effects that you should report to your care team as soon as possible: Allergic reactions--skin rash, itching, hives, swelling of the face, lips, tongue, or throat Bleeding--bloody or black, tar-like stools, vomiting blood or brown material that looks like coffee grounds, red or dark brown urine, small red or purple spots on skin,  unusual bruising or bleeding Bleeding in the brain--severe headache, stiff neck, confusion, dizziness, change in vision, numbness or weakness of the face, arm, or leg, trouble speaking, trouble walking, vomiting Bowel blockage--stomach cramping, unable to have a bowel movement or pass gas, loss of appetite, vomiting Heart failure--shortness of breath, swelling of the ankles, feet, or hands, sudden weight gain, unusual weakness or fatigue Infection--fever, chills, cough, sore throat, wounds that don't heal, pain or trouble when passing urine, general feeling of discomfort or being unwell Liver injury--right upper belly pain, loss of appetite, nausea, light-colored stool, dark yellow or brown urine, yellowing skin or eyes, unusual weakness or fatigue Low blood pressure--dizziness, feeling faint or lightheaded, blurry vision Lung injury--shortness of breath or trouble breathing, cough, spitting up blood, chest pain, fever Pain, tingling, or numbness in the hands or feet Severe or prolonged diarrhea Stomach pain, bloody diarrhea, pale skin, unusual weakness or fatigue, decrease in the amount of urine, which may be signs of hemolytic uremic syndrome Sudden and severe headache, confusion, change in vision, seizures, which may be signs of posterior reversible encephalopathy syndrome (PRES) TTP--purple spots on the skin or inside the mouth, pale skin, yellowing skin or eyes, unusual weakness or fatigue, fever, fast or irregular heartbeat, confusion, change in vision, trouble speaking, trouble walking Tumor lysis syndrome (TLS)--nausea, vomiting, diarrhea, decrease in the amount of urine, dark urine, unusual weakness or fatigue, confusion, muscle pain or cramps, fast or irregular heartbeat, joint pain Side effects that usually do not require medical attention (report to your care team if they continue or are bothersome): Constipation Diarrhea Fatigue Loss of appetite Nausea This list may not describe all  possible side effects. Call your doctor for medical advice about side effects. You may report side effects to FDA at 1-800-FDA-1088. Where should I keep my medication? This medication is given in a hospital or clinic. It will not be stored at home. NOTE: This sheet is a summary. It may not cover all possible information. If you have questions about this medicine, talk to your doctor, pharmacist, or health care provider.  2024 Elsevier/Gold Standard (2021-06-28 00:00:00)

## 2023-10-03 LAB — KAPPA/LAMBDA LIGHT CHAINS
Kappa free light chain: 12.4 mg/L (ref 3.3–19.4)
Kappa, lambda light chain ratio: 1.97 — ABNORMAL HIGH (ref 0.26–1.65)
Lambda free light chains: 6.3 mg/L (ref 5.7–26.3)

## 2023-10-04 LAB — MULTIPLE MYELOMA PANEL, SERUM
Albumin SerPl Elph-Mcnc: 3.5 g/dL (ref 2.9–4.4)
Albumin/Glob SerPl: 1.5 (ref 0.7–1.7)
Alpha 1: 0.2 g/dL (ref 0.0–0.4)
Alpha2 Glob SerPl Elph-Mcnc: 0.7 g/dL (ref 0.4–1.0)
B-Globulin SerPl Elph-Mcnc: 0.9 g/dL (ref 0.7–1.3)
Gamma Glob SerPl Elph-Mcnc: 0.5 g/dL (ref 0.4–1.8)
Globulin, Total: 2.4 g/dL (ref 2.2–3.9)
IgA: 100 mg/dL (ref 61–437)
IgG (Immunoglobin G), Serum: 653 mg/dL (ref 603–1613)
IgM (Immunoglobulin M), Srm: 16 mg/dL (ref 15–143)
M Protein SerPl Elph-Mcnc: 0.1 g/dL — ABNORMAL HIGH
Total Protein ELP: 5.9 g/dL — ABNORMAL LOW (ref 6.0–8.5)

## 2023-10-09 ENCOUNTER — Telehealth: Payer: Self-pay | Admitting: *Deleted

## 2023-10-09 NOTE — Telephone Encounter (Signed)
 The patient wanted to know when his next appointment is it is on 9/ 9 at 8:30.  He thanked me for getting the information to them

## 2023-10-16 ENCOUNTER — Encounter: Payer: Self-pay | Admitting: Internal Medicine

## 2023-10-16 ENCOUNTER — Inpatient Hospital Stay (HOSPITAL_BASED_OUTPATIENT_CLINIC_OR_DEPARTMENT_OTHER): Admitting: Internal Medicine

## 2023-10-16 ENCOUNTER — Inpatient Hospital Stay: Attending: Internal Medicine

## 2023-10-16 ENCOUNTER — Inpatient Hospital Stay

## 2023-10-16 VITALS — BP 138/92 | HR 91 | Temp 97.6°F | Resp 20 | Ht 70.0 in | Wt 194.2 lb

## 2023-10-16 DIAGNOSIS — Z5112 Encounter for antineoplastic immunotherapy: Secondary | ICD-10-CM | POA: Diagnosis not present

## 2023-10-16 DIAGNOSIS — Z5111 Encounter for antineoplastic chemotherapy: Secondary | ICD-10-CM | POA: Diagnosis present

## 2023-10-16 DIAGNOSIS — E876 Hypokalemia: Secondary | ICD-10-CM | POA: Insufficient documentation

## 2023-10-16 DIAGNOSIS — C9002 Multiple myeloma in relapse: Secondary | ICD-10-CM

## 2023-10-16 DIAGNOSIS — E039 Hypothyroidism, unspecified: Secondary | ICD-10-CM | POA: Insufficient documentation

## 2023-10-16 DIAGNOSIS — G8929 Other chronic pain: Secondary | ICD-10-CM | POA: Diagnosis not present

## 2023-10-16 DIAGNOSIS — E875 Hyperkalemia: Secondary | ICD-10-CM | POA: Insufficient documentation

## 2023-10-16 DIAGNOSIS — N183 Chronic kidney disease, stage 3 unspecified: Secondary | ICD-10-CM | POA: Insufficient documentation

## 2023-10-16 LAB — CBC WITH DIFFERENTIAL (CANCER CENTER ONLY)
Abs Immature Granulocytes: 0.01 K/uL (ref 0.00–0.07)
Basophils Absolute: 0 K/uL (ref 0.0–0.1)
Basophils Relative: 0 %
Eosinophils Absolute: 0.1 K/uL (ref 0.0–0.5)
Eosinophils Relative: 1 %
HCT: 42.5 % (ref 39.0–52.0)
Hemoglobin: 13.8 g/dL (ref 13.0–17.0)
Immature Granulocytes: 0 %
Lymphocytes Relative: 9 %
Lymphs Abs: 0.5 K/uL — ABNORMAL LOW (ref 0.7–4.0)
MCH: 31.5 pg (ref 26.0–34.0)
MCHC: 32.5 g/dL (ref 30.0–36.0)
MCV: 97 fL (ref 80.0–100.0)
Monocytes Absolute: 0.6 K/uL (ref 0.1–1.0)
Monocytes Relative: 13 %
Neutro Abs: 3.8 K/uL (ref 1.7–7.7)
Neutrophils Relative %: 77 %
Platelet Count: 321 K/uL (ref 150–400)
RBC: 4.38 MIL/uL (ref 4.22–5.81)
RDW: 12.8 % (ref 11.5–15.5)
WBC Count: 5 K/uL (ref 4.0–10.5)
nRBC: 0 % (ref 0.0–0.2)

## 2023-10-16 LAB — CMP (CANCER CENTER ONLY)
ALT: 19 U/L (ref 0–44)
AST: 16 U/L (ref 15–41)
Albumin: 4.2 g/dL (ref 3.5–5.0)
Alkaline Phosphatase: 64 U/L (ref 38–126)
Anion gap: 9 (ref 5–15)
BUN: 16 mg/dL (ref 8–23)
CO2: 25 mmol/L (ref 22–32)
Calcium: 9.3 mg/dL (ref 8.9–10.3)
Chloride: 103 mmol/L (ref 98–111)
Creatinine: 1.22 mg/dL (ref 0.61–1.24)
GFR, Estimated: >60 mL/min (ref >60)
Glucose, Bld: 155 mg/dL — ABNORMAL HIGH (ref 70–99)
Potassium: 4.2 mmol/L (ref 3.5–5.1)
Sodium: 137 mmol/L (ref 135–145)
Total Bilirubin: 0.8 mg/dL (ref 0.0–1.2)
Total Protein: 7.1 g/dL (ref 6.5–8.1)

## 2023-10-16 MED ORDER — DEXAMETHASONE 4 MG PO TABS
20.0000 mg | ORAL_TABLET | Freq: Once | ORAL | Status: AC
  Administered 2023-10-16: 20 mg via ORAL
  Filled 2023-10-16: qty 5

## 2023-10-16 MED ORDER — DIPHENHYDRAMINE HCL 25 MG PO CAPS
50.0000 mg | ORAL_CAPSULE | Freq: Once | ORAL | Status: AC
  Administered 2023-10-16: 50 mg via ORAL
  Filled 2023-10-16: qty 2

## 2023-10-16 MED ORDER — CHLORPROMAZINE HCL 25 MG PO TABS: 25.0000 mg | ORAL_TABLET | Freq: Three times a day (TID) | ORAL | 1 refills | Status: AC | PRN

## 2023-10-16 MED ORDER — BORTEZOMIB CHEMO SQ INJECTION 3.5 MG (2.5MG/ML)
1.3000 mg/m2 | Freq: Once | INTRAMUSCULAR | Status: AC
  Administered 2023-10-16: 2.75 mg via SUBCUTANEOUS
  Filled 2023-10-16: qty 1.1

## 2023-10-16 MED ORDER — ACETAMINOPHEN 325 MG PO TABS
650.0000 mg | ORAL_TABLET | Freq: Once | ORAL | Status: AC
  Administered 2023-10-16: 650 mg via ORAL
  Filled 2023-10-16: qty 2

## 2023-10-16 MED ORDER — DARATUMUMAB-HYALURONIDASE-FIHJ 1800-30000 MG-UT/15ML ~~LOC~~ SOLN
1800.0000 mg | Freq: Once | SUBCUTANEOUS | Status: AC
  Administered 2023-10-16: 1800 mg via SUBCUTANEOUS
  Filled 2023-10-16: qty 15

## 2023-10-16 MED ORDER — PROCHLORPERAZINE MALEATE 10 MG PO TABS: 10.0000 mg | ORAL_TABLET | Freq: Four times a day (QID) | ORAL | 1 refills | Status: AC | PRN

## 2023-10-16 NOTE — Assessment & Plan Note (Addendum)
#  Multiple myeloma-PROGRESSED ON  maintenance Revlimid .  JAN 2025-  M-protein- 1.3[April 2023- NEG]; kappa=113 lambda light chain ratio=2.5 [July 2020- 2.0]. RISING- ca- 10.4. # APRIL 2025- . Diffuse lytic myelomatous lesions throughout the skeleton. Most of these lesions demonstrate hypermetabolism; Healing left mid clavicle fracture, likely pathologic; Early nondisplaced pathological fracture involving the right mid clavicle; Left pubic bone lesion has a defect in the outer cortex and this could be a pending pathologic fracture; . No findings for extraskeletal myeloma. Bone marrow Bx- on APRIL 25th, 2025- Pending results.  April 2025 discontinue lenalidomide . On Dara Velcade  dexamethasone .    # 09/24/2023- Apheresis [[facility]]; last dose chemo [sep 16th]- LD chemo at [facility] on 1st OCT- and CART on OCT on 6th.   # proceed with bridging therapy Dara Velcade  dexamethasone .  cycle # 7 day-1  today. Labs-CBC/chemistries were reviewed with the patient.    # Diarrhea intermittent/hiccups/weight loss- improved- continue 20 mg of dexamethasone - stable.  # Renal: Hyperkalemia/hypokalemia- rCONTINUE  Kdur 2 pills  BID #  baseline creatinine 1.3-1.4; peak 1.8]. SABRA April 2024- Vit D 38- recommend Vit D 50,K/weekly-  CONTINUE TO HOLD QUINAPRIL -  stable-weight gain/leg swelling-recommend leg elevation compression stockings- stable.  # chronic back pain: Progressive bone lesions on PET scan April 2025-status post Zometa  improved with tramadol  at bedtime stable.  # DM- on OHA -recommend close monitoring of blood glucose levels. Recommend bringing log at this time.  stable.  # Hypothyroidism: on synthroid -  stable.  # infection prophylaxis: Continue  acyclovir  twice a day.  stable.  # ACP [06/05/2023]:  full code.   For 1st 2 cycles- Velcade  twice weekly- Dara-Velcade  q Weekly x 4 cycles- starting # 5-8 dara q 3W- MD only on day-1; cycles 9+- dara q 28 days  valcade;  Zometa  q 6 M-last 08/07/2023-  PS;  Disposition: AM  preference # chemo- today # as planned chemo next week # follow up in 2 months- MD; labs-cbc; cmp; vit D 25-Oh; MM panel; K/l light chains - Dr.B

## 2023-10-16 NOTE — Patient Instructions (Signed)
 [name] - A DEPT OF [facility]  Discharge Instructions: Thank you for choosing [facility] to provide your oncology and hematology care.  If you have a lab appointment with the Cancer Center, please go directly to the Cancer Center and check in at the registration area.  Wear comfortable clothing and clothing appropriate for easy access to any Portacath or PICC line.   We strive to give you quality time with your provider. You may need to reschedule your appointment if you arrive late (15 or more minutes).  Arriving late affects you and other patients whose appointments are after yours.  Also, if you miss three or more appointments without notifying the office, you may be dismissed from the clinic at the provider's discretion.      For prescription refill requests, have your pharmacy contact our office and allow 72 hours for refills to be completed.    Today you received the following chemotherapy and/or immunotherapy agents- valcade, darzalex  faspro      To help prevent nausea and vomiting after your treatment, we encourage you to take your nausea medication as directed.  BELOW ARE SYMPTOMS THAT SHOULD BE REPORTED IMMEDIATELY: *FEVER GREATER THAN 100.4 F (38 C) OR HIGHER *CHILLS OR SWEATING *NAUSEA AND VOMITING THAT IS NOT CONTROLLED WITH YOUR NAUSEA MEDICATION *UNUSUAL SHORTNESS OF BREATH *UNUSUAL BRUISING OR BLEEDING *URINARY PROBLEMS (pain or burning when urinating, or frequent urination) *BOWEL PROBLEMS (unusual diarrhea, constipation, pain near the anus) TENDERNESS IN MOUTH AND THROAT WITH OR WITHOUT PRESENCE OF ULCERS (sore throat, sores in mouth, or a toothache) UNUSUAL RASH, SWELLING OR PAIN  UNUSUAL VAGINAL DISCHARGE OR ITCHING   Items with * indicate a potential emergency and should be followed up as soon as possible or go to the Emergency Department if any problems should occur.  Please show the CHEMOTHERAPY ALERT CARD or  IMMUNOTHERAPY ALERT CARD at check-in to the Emergency Department and triage nurse.  Should you have questions after your visit or need to cancel or reschedule your appointment, please contact [name] - A DEPT OF [facility]  [phone] and follow the prompts.  Office hours are 8:00 a.m. to 4:30 p.m. Monday - Friday. Please note that voicemails left after 4:00 p.m. may not be returned until the following business day.  We are closed weekends and major holidays. You have access to a nurse at all times for urgent questions. Please call the main number to the clinic [phone] and follow the prompts.  For any non-urgent questions, you may also contact your provider using MyChart. We now offer e-Visits for anyone 8 and older to request care online for non-urgent symptoms. For details visit [email or web address].   Also download the MyChart app! Go to the app store, search MyChart, open the app, select [place], and log in with your MyChart username and password.

## 2023-10-16 NOTE — Progress Notes (Signed)
 [facility] OFFICE PROGRESS NOTE  Patient Care Team: Rudolpho Norleen BIRCH, MD as PCP - General (Internal Medicine) Ivin Kocher, MD as Consulting Physician (Dermatology) Rennie Cindy SAUNDERS, MD as Consulting Physician (Oncology)   Cancer Staging  Multiple myeloma in relapse [facility]) Staging form: Plasma Cell Myeloma and Plasma Cell Disorders, AJCC 8th Edition - Clinical: No stage assigned - Unsigned - Clinical: RISS Stage I (Beta-2 -microglobulin (mg/L): 2.4, Albumin (g/dL): 3.5, ISS: Stage I, High-risk cytogenetics: Absent, LDH: Normal) - Signed by Rennie Cindy SAUNDERS, MD on 06/19/2023 Beta 2 microglobulin range (mg/L): Less than 3.5 Albumin range (g/dL): Greater than or equal to 3.5 Cytogenetics: No abnormalities    Oncology History Overview Note   # April 2014- MULTIPLE MYELOMA [IgG- 2.2gm/dl; 59-49% plasma cell; Cyto-N; FISH- gain of chr.9, 15 & ccnd1/11q13] s/p RVD x6 [sep 2014; BMBx- ~5% plasma cells]; Maint Rev-DEX-Zometa ; March 2015-Stopped Dex Cont- Rev-Zometa ; July 2016- Rev 25mg ; NOV 4th  2016-HOLD; BMT eval at [facility] [Dr.Woods]; declined BMT.   # Re-START FEB 2017 Rev 15mg  3 W- on & 1 w OFF; HELD Rev June- Oct 2020- sec to worsening renal function [Dr.Singh]; SEP 2021- Rev 15 mg 2 w-On & 2 w-OFF. STOP REVLIMD in APRIL 2025-progression.    # APRIL 2025- . Diffuse lytic myelomatous lesions throughout the skeleton. Most of these lesions demonstrate hypermetabolism; Healing left mid clavicle fracture, likely pathologic; Early nondisplaced pathological fracture involving the right mid clavicle; Left pubic bone lesion has a defect in the outer cortex and this could be a pending pathologic fracture; . No findings for extraskeletal myeloma. Bone marrow Bx- on APRIL 25th, 2025- Pending.  # April 2025 discontinue lenalidomide .  # APRIL 29th, 2025- start Dara-Velcade  -Dex   # Zometa   # CKD [creat ~1.4; sec MM; Dr.Singh];  DM  -------------------------------------------------------------------------   DIAGNOSIS: [name] ] MULTIPLE MYELOMA  GOALS: control     Multiple myeloma in remission (HCC)  10/01/2015 Initial Diagnosis   Multiple myeloma in remission (HCC)   Multiple myeloma in relapse (HCC)  05/14/2023 Initial Diagnosis   Multiple myeloma in relapse (HCC)   06/05/2023 -  Chemotherapy   Patient is on Treatment Plan : MYELOMA RELAPSED / REFRACTORY Daratumumab  SQ + Bortezomib  + Dexamethasone  (DaraVd) q21d / Daratumumab  SQ q28d      06/05/2023 Cancer Staging   Staging form: Plasma Cell Myeloma and Plasma Cell Disorders, AJCC 8th Edition - Clinical: RISS Stage I (Beta-2 -microglobulin (mg/L): 2.4, Albumin (g/dL): 3.5, ISS: Stage I, High-risk cytogenetics: Absent, LDH: Normal) - Signed by Rennie Cindy SAUNDERS, MD on 06/19/2023 Beta 2 microglobulin range (mg/L): Less than 3.5 Albumin range (g/dL): Greater than or equal to 3.5 Cytogenetics: No abnormalities    INTERVAL HISTORY: Accompanied by his wife. Ambulating independently.   Husam Hohn 78 y.o.  male pleasant patient above history of recurrent multiple myeloma; CKD stage III; DM on dara-VD  is to  proceed with chemotherapy.   Patient awaiting on oct 6th for CAR-T therapy.  Noted to have improvement of his hip pain. Patient currently taking  tramadol  1 pills  in AM and at bedtime prn.     Patient denies any skin rash. Patient also denies any tingling or numbness. Mild swelling in the legs.    Review of Systems  Constitutional:  Negative for chills, diaphoresis, fever, malaise/fatigue and weight loss.  HENT:  Negative for nosebleeds and sore throat.   Eyes:  Negative for double vision.  Respiratory:  Negative for cough, hemoptysis, sputum production, shortness of breath  and wheezing.   Cardiovascular:  Positive for leg swelling. Negative for chest pain, palpitations and orthopnea.  Gastrointestinal:  Negative for abdominal pain, blood in stool,  constipation, diarrhea, heartburn, melena, nausea and vomiting.  Genitourinary:  Negative for dysuria, frequency and urgency.  Musculoskeletal:  Positive for back pain and joint pain.  Skin: Negative.  Negative for itching and rash.  Neurological:  Negative for dizziness, tingling, focal weakness, weakness and headaches.  Endo/Heme/Allergies:  Does not bruise/bleed easily.  Psychiatric/Behavioral:  Negative for depression. The patient is not nervous/anxious and does not have insomnia.       PAST MEDICAL HISTORY :  Past Medical History:  Diagnosis Date   Diabetes mellitus without complication (HCC)    GERD (gastroesophageal reflux disease)    Hyperlipidemia    Hypertension    Multiple myeloma (HCC)    Obesity    Thyroid cancer (HCC)    Thyroid disease     PAST SURGICAL HISTORY :   Past Surgical History:  Procedure Laterality Date   BONE MARROW BIOPSY  2014   CATARACT EXTRACTION BILATERAL W/ ANTERIOR VITRECTOMY     IR BONE MARROW BIOPSY & ASPIRATION  06/01/2023    FAMILY HISTORY :   Family History  Problem Relation Age of Onset   Cancer Father 37   Diabetes Mother     SOCIAL HISTORY:   Social History   Tobacco Use   Smoking status: Never   Smokeless tobacco: Never  Substance Use Topics   Alcohol use: No   Drug use: No    ALLERGIES:  has no known allergies.  MEDICATIONS:  Current Outpatient Medications  Medication Sig Dispense Refill   acyclovir  (ZOVIRAX ) 400 MG tablet Take 1 tablet (400 mg total) by mouth 2 (two) times daily. 120 tablet 2   amLODipine  (NORVASC ) 10 MG tablet Take 10 mg by mouth daily.     aspirin EC 81 MG tablet Take 81 mg by mouth daily.     atorvastatin  (LIPITOR) 20 MG tablet Take 20 mg by mouth daily.     chlorproMAZINE  (THORAZINE ) 25 MG tablet Take 1 tablet (25 mg total) by mouth 3 (three) times daily. 60 tablet 1   chlorproMAZINE  (THORAZINE ) 25 MG tablet Take 1 tablet (25 mg total) by mouth 3 (three) times daily as needed. 60 tablet 1    Cyanocobalamin 1000 MCG TBCR Take by mouth.     cyclobenzaprine  (FLEXERIL ) 5 MG tablet Take 1 tablet (5 mg total) by mouth 3 (three) times daily as needed. 15 tablet 0   dapagliflozin propanediol (FARXIGA) 10 MG TABS tablet TAKE 10 MG BY MOUTH 1 (ONE) TIME EACH DAY IN THE MORNING     ergocalciferol  (VITAMIN D2) 1.25 MG (50000 UT) capsule Take 1 capsule (50,000 Units total) by mouth once a week. 12 capsule 1   glipiZIDE (GLUCOTROL) 5 MG tablet Take 5 mg by mouth every morning.     levothyroxine  (SYNTHROID ) 137 MCG tablet Take 1 tablet by mouth daily.      ondansetron  (ZOFRAN ) 8 MG tablet One pill every 8 hours as needed for nausea/vomitting. 60 tablet 1   ONETOUCH DELICA LANCETS 33G MISC Inject 1 Lancet as directed once daily.     potassium chloride  SA (K-DUR,KLOR-CON ) 20 MEQ tablet Take 20 mEq by mouth 2 (two) times daily.   0   sodium bicarbonate 650 MG tablet Take 650 mg by mouth 2 (two) times daily.     traMADol  (ULTRAM ) 50 MG tablet Take 1 tablet (50 mg  total) by mouth every 12 (twelve) hours as needed. 60 tablet 0   prochlorperazine  (COMPAZINE ) 10 MG tablet Take 1 tablet (10 mg total) by mouth every 6 (six) hours as needed for nausea or vomiting. 40 tablet 1   No current facility-administered medications for this visit.   Facility-Administered Medications Ordered in Other Visits  Medication Dose Route Frequency Provider Last Rate Last Admin   bortezomib  SQ (VELCADE ) chemo injection (2.5mg /mL concentration) 2.75 mg  1.3 mg/m2 (Treatment Plan Recorded) Subcutaneous Once Joselyne Spake R, MD       daratumumab -hyaluronidase -fihj (DARZALEX  FASPRO) 1800-30000 MG-UT/15ML chemo SQ injection 1,800 mg  1,800 mg Subcutaneous Once Oluwaseyi Raffel R, MD        PHYSICAL EXAMINATION: ECOG PERFORMANCE STATUS: 0 - Asymptomatic  BP (!) 138/92   Pulse 91   Temp 97.6 F (36.4 C)   Resp 20   Ht 5' 10 (1.778 m)   Wt 194 lb 3.2 oz (88.1 kg)   SpO2 100%   BMI 27.86 kg/m   Filed Weights    10/16/23 0828  Weight: 194 lb 3.2 oz (88.1 kg)        Physical Exam Vitals and nursing note reviewed.  HENT:     Head: Normocephalic and atraumatic.     Mouth/Throat:     Pharynx: Oropharynx is clear. No oropharyngeal exudate.  Eyes:     Extraocular Movements: Extraocular movements intact.     Pupils: Pupils are equal, round, and reactive to light.  Cardiovascular:     Rate and Rhythm: Normal rate and regular rhythm.  Pulmonary:     Effort: No respiratory distress.     Breath sounds: No wheezing.     Comments: Decreased breath sounds bilaterally.  Abdominal:     General: Bowel sounds are normal. There is no distension.     Palpations: Abdomen is soft. There is no mass.     Tenderness: There is no abdominal tenderness. There is no guarding or rebound.  Musculoskeletal:        General: No tenderness. Normal range of motion.     Cervical back: Normal range of motion and neck supple.  Skin:    General: Skin is warm.  Neurological:     General: No focal deficit present.     Mental Status: He is alert and oriented to person, place, and time.  Psychiatric:        Mood and Affect: Affect normal.        Behavior: Behavior normal.        Judgment: Judgment normal.       LABORATORY DATA:  I have reviewed the data as listed    Component Value Date/Time   NA 137 10/16/2023 0825   NA 139 05/01/2014 1322   K 4.2 10/16/2023 0825   K 4.4 05/01/2014 1322   CL 103 10/16/2023 0825   CL 107 05/01/2014 1322   CO2 25 10/16/2023 0825   CO2 27 05/01/2014 1322   GLUCOSE 155 (H) 10/16/2023 0825   GLUCOSE 102 (H) 05/01/2014 1322   BUN 16 10/16/2023 0825   BUN 16 05/01/2014 1322   CREATININE 1.22 10/16/2023 0825   CREATININE 1.48 (H) 05/29/2014 0949   CALCIUM 9.3 10/16/2023 0825   CALCIUM 9.1 05/01/2014 1322   PROT 7.1 10/16/2023 0825   PROT 7.5 05/01/2014 1322   ALBUMIN 4.2 10/16/2023 0825   ALBUMIN 3.6 05/01/2014 1322   AST 16 10/16/2023 0825   ALT 19 10/16/2023 0825   ALT  13 (  L) 05/01/2014 1322   ALKPHOS 64 10/16/2023 0825   ALKPHOS 47 05/01/2014 1322   BILITOT 0.8 10/16/2023 0825   GFRNONAA >60 10/16/2023 0825   GFRNONAA 47 (L) 05/29/2014 0949   GFRAA 47 (L) 10/21/2019 1106   GFRAA 55 (L) 05/29/2014 0949    No results found for: SPEP, UPEP  Lab Results  Component Value Date   WBC 5.0 10/16/2023   NEUTROABS 3.8 10/16/2023   HGB 13.8 10/16/2023   HCT 42.5 10/16/2023   MCV 97.0 10/16/2023   PLT 321 10/16/2023      Chemistry      Component Value Date/Time   NA 137 10/16/2023 0825   NA 139 05/01/2014 1322   K 4.2 10/16/2023 0825   K 4.4 05/01/2014 1322   CL 103 10/16/2023 0825   CL 107 05/01/2014 1322   CO2 25 10/16/2023 0825   CO2 27 05/01/2014 1322   BUN 16 10/16/2023 0825   BUN 16 05/01/2014 1322   CREATININE 1.22 10/16/2023 0825   CREATININE 1.48 (H) 05/29/2014 0949      Component Value Date/Time   CALCIUM 9.3 10/16/2023 0825   CALCIUM 9.1 05/01/2014 1322   ALKPHOS 64 10/16/2023 0825   ALKPHOS 47 05/01/2014 1322   AST 16 10/16/2023 0825   ALT 19 10/16/2023 0825   ALT 13 (L) 05/01/2014 1322   BILITOT 0.8 10/16/2023 0825       RADIOGRAPHIC STUDIES: I have personally reviewed the radiological images as listed and agreed with the findings in the report. No results found.   ASSESSMENT & PLAN:  Multiple myeloma in relapse (HCC) #Multiple myeloma-PROGRESSED ON  maintenance Revlimid .  JAN 2025-  M-protein- 1.3[April 2023- NEG]; kappa=113 lambda light chain ratio=2.5 [July 2020- 2.0]. RISING- ca- 10.4. # APRIL 2025- . Diffuse lytic myelomatous lesions throughout the skeleton. Most of these lesions demonstrate hypermetabolism; Healing left mid clavicle fracture, likely pathologic; Early nondisplaced pathological fracture involving the right mid clavicle; Left pubic bone lesion has a defect in the outer cortex and this could be a pending pathologic fracture; . No findings for extraskeletal myeloma. Bone marrow Bx- on APRIL 25th, 2025-  Pending results.  April 2025 discontinue lenalidomide . On Dara Velcade  dexamethasone .    # 09/24/2023- Apheresis [[facility]]; last dose chemo [sep 16th]- LD chemo at [facility] on 1st OCT- and CART on OCT on 6th.   # proceed with bridging therapy Dara Velcade  dexamethasone .  cycle # 7 day-1  today. Labs-CBC/chemistries were reviewed with the patient.    # Diarrhea intermittent/hiccups/weight loss- improved- continue 20 mg of dexamethasone - stable.  # Renal: Hyperkalemia/hypokalemia- rCONTINUE  Kdur 2 pills  BID #  baseline creatinine 1.3-1.4; peak 1.8]. SABRA April 2024- Vit D 38- recommend Vit D 50,K/weekly-  CONTINUE TO HOLD QUINAPRIL -  stable-weight gain/leg swelling-recommend leg elevation compression stockings- stable.  # chronic back pain: Progressive bone lesions on PET scan April 2025-status post Zometa  improved with tramadol  at bedtime stable.  # DM- on OHA -recommend close monitoring of blood glucose levels. Recommend bringing log at this time.  stable.  # Hypothyroidism: on synthroid -  stable.  # infection prophylaxis: Continue  acyclovir  twice a day.  stable.  # ACP [06/05/2023]:  full code.   For 1st 2 cycles- Velcade  twice weekly- Dara-Velcade  q Weekly x 4 cycles- starting # 5-8 dara q 3W- MD only on day-1; cycles 9+- dara q 28 days  valcade;  Zometa  q 6 M-last 08/07/2023-  PS;  Disposition: AM preference # chemo- today # as  planned chemo next week # follow up in 2 months- MD; labs-cbc; cmp; vit D 25-Oh; MM panel; K/l light chains - Dr.B     No orders of the defined types were placed in this encounter.   All questions were answered. The patient knows to call the clinic with any problems, questions or concerns.      Cindy JONELLE Joe, MD 10/16/2023 9:35 AM

## 2023-10-16 NOTE — Progress Notes (Signed)
 Patient has no concerns.  Patient would like a refill for Ondansetron , Chlorpromazine .

## 2023-10-17 ENCOUNTER — Other Ambulatory Visit: Payer: Self-pay

## 2023-10-17 LAB — KAPPA/LAMBDA LIGHT CHAINS
Kappa free light chain: 13.3 mg/L (ref 3.3–19.4)
Kappa, lambda light chain ratio: 1.8 — ABNORMAL HIGH (ref 0.26–1.65)
Lambda free light chains: 7.4 mg/L (ref 5.7–26.3)

## 2023-10-18 LAB — MULTIPLE MYELOMA PANEL, SERUM
Albumin SerPl Elph-Mcnc: 3.8 g/dL (ref 2.9–4.4)
Albumin/Glob SerPl: 1.5 (ref 0.7–1.7)
Alpha 1: 0.2 g/dL (ref 0.0–0.4)
Alpha2 Glob SerPl Elph-Mcnc: 0.8 g/dL (ref 0.4–1.0)
B-Globulin SerPl Elph-Mcnc: 1.1 g/dL (ref 0.7–1.3)
Gamma Glob SerPl Elph-Mcnc: 0.6 g/dL (ref 0.4–1.8)
Globulin, Total: 2.7 g/dL (ref 2.2–3.9)
IgA: 111 mg/dL (ref 61–437)
IgG (Immunoglobin G), Serum: 646 mg/dL (ref 603–1613)
IgM (Immunoglobulin M), Srm: 15 mg/dL (ref 15–143)
M Protein SerPl Elph-Mcnc: 0.2 g/dL — ABNORMAL HIGH
Total Protein ELP: 6.5 g/dL (ref 6.0–8.5)

## 2023-10-22 ENCOUNTER — Telehealth: Payer: Self-pay | Admitting: Internal Medicine

## 2023-10-22 ENCOUNTER — Other Ambulatory Visit: Payer: Self-pay | Admitting: Internal Medicine

## 2023-10-22 ENCOUNTER — Telehealth: Payer: Self-pay | Admitting: *Deleted

## 2023-10-22 NOTE — Progress Notes (Signed)
 Per [facility]- will cancel appt for 9/16- keep other appts as planned- discussed with sherry  GB

## 2023-10-22 NOTE — Telephone Encounter (Signed)
 Sari  at [facility] for [facility] with CAR-T and his cells are almost ready so the doctor there says the patient should not come tomorrow and get any treatment because it could make  a wash him out.  Dr. Rennie says thats fine  cancel the appt. tomorrow and we will cancel it and let the patient know that also. I called the patient and let him know that [facility] states for the patient not to come here tomorrow. The cells that at making is close to get the Car-t. He understands.pt understands

## 2023-10-22 NOTE — Telephone Encounter (Signed)
 Per [facility]- will cancel appt for 9/16- keep other appts as planned- discussed with sherry  GB

## 2023-10-23 ENCOUNTER — Inpatient Hospital Stay

## 2023-10-26 ENCOUNTER — Other Ambulatory Visit: Payer: Self-pay | Admitting: Internal Medicine

## 2023-10-26 ENCOUNTER — Other Ambulatory Visit: Payer: Self-pay

## 2023-10-26 ENCOUNTER — Telehealth: Payer: Self-pay | Admitting: *Deleted

## 2023-10-26 DIAGNOSIS — C9002 Multiple myeloma in relapse: Secondary | ICD-10-CM

## 2023-10-26 MED ORDER — TRAMADOL HCL 50 MG PO TABS: 50.0000 mg | ORAL_TABLET | Freq: Two times a day (BID) | ORAL | 0 refills | Status: DC | PRN

## 2023-10-26 MED ORDER — ONDANSETRON HCL 8 MG PO TABS: ORAL_TABLET | ORAL | 1 refills | Status: AC

## 2023-10-26 NOTE — Telephone Encounter (Signed)
 Pended for Dr. KATHEE.

## 2023-10-26 NOTE — Telephone Encounter (Signed)
 Patient requests refill for ondanstron and tramadol .

## 2023-11-15 ENCOUNTER — Other Ambulatory Visit: Payer: Self-pay | Admitting: Internal Medicine

## 2023-11-15 ENCOUNTER — Other Ambulatory Visit: Payer: Self-pay

## 2023-12-07 ENCOUNTER — Encounter: Payer: Self-pay | Admitting: Internal Medicine

## 2023-12-07 NOTE — Telephone Encounter (Signed)
 duplicate

## 2023-12-11 ENCOUNTER — Other Ambulatory Visit: Payer: Self-pay

## 2023-12-11 ENCOUNTER — Other Ambulatory Visit: Payer: Self-pay | Admitting: Internal Medicine

## 2023-12-11 DIAGNOSIS — C9002 Multiple myeloma in relapse: Secondary | ICD-10-CM

## 2023-12-13 ENCOUNTER — Other Ambulatory Visit: Payer: Self-pay | Admitting: *Deleted

## 2023-12-13 ENCOUNTER — Other Ambulatory Visit: Payer: Self-pay

## 2023-12-13 DIAGNOSIS — C9002 Multiple myeloma in relapse: Secondary | ICD-10-CM

## 2023-12-13 MED ORDER — TRAMADOL HCL 50 MG PO TABS: 50.0000 mg | ORAL_TABLET | Freq: Two times a day (BID) | ORAL | 0 refills | Status: DC | PRN

## 2023-12-18 ENCOUNTER — Inpatient Hospital Stay

## 2023-12-18 ENCOUNTER — Inpatient Hospital Stay: Admitting: Internal Medicine

## 2024-02-26 ENCOUNTER — Other Ambulatory Visit: Payer: Self-pay | Admitting: Internal Medicine

## 2024-02-26 DIAGNOSIS — C9002 Multiple myeloma in relapse: Secondary | ICD-10-CM
